# Patient Record
Sex: Male | Born: 1955
Health system: Southern US, Community
[De-identification: ages and names within clinical notes are randomized; demographics above are authoritative.]

## PROBLEM LIST (undated history)

## (undated) DIAGNOSIS — Z86010 Personal history of colonic polyps: Secondary | ICD-10-CM

## (undated) DIAGNOSIS — J449 Chronic obstructive pulmonary disease, unspecified: Secondary | ICD-10-CM

## (undated) DIAGNOSIS — I10 Essential (primary) hypertension: Secondary | ICD-10-CM

## (undated) DIAGNOSIS — R972 Elevated prostate specific antigen [PSA]: Secondary | ICD-10-CM

## (undated) DIAGNOSIS — C61 Malignant neoplasm of prostate: Secondary | ICD-10-CM

## (undated) DIAGNOSIS — E785 Hyperlipidemia, unspecified: Secondary | ICD-10-CM

## (undated) DIAGNOSIS — M1990 Unspecified osteoarthritis, unspecified site: Secondary | ICD-10-CM

## (undated) DIAGNOSIS — C801 Malignant (primary) neoplasm, unspecified: Secondary | ICD-10-CM

## (undated) DIAGNOSIS — R7309 Other abnormal glucose: Secondary | ICD-10-CM

## (undated) DIAGNOSIS — E559 Vitamin D deficiency, unspecified: Secondary | ICD-10-CM

## (undated) DIAGNOSIS — R7303 Prediabetes: Secondary | ICD-10-CM

## (undated) DIAGNOSIS — N320 Bladder-neck obstruction: Secondary | ICD-10-CM

## (undated) HISTORY — PX: COLONOSCOPY: SHX174

## (undated) HISTORY — DX: Vitamin D deficiency, unspecified: E55.9

## (undated) HISTORY — DX: Malignant (primary) neoplasm, unspecified: C80.1

## (undated) HISTORY — DX: Unspecified osteoarthritis, unspecified site: M19.90

## (undated) HISTORY — DX: Essential (primary) hypertension: I10

## (undated) HISTORY — DX: Personal history of colonic polyps: Z86.010

## (undated) HISTORY — DX: Other abnormal glucose: R73.09

## (undated) HISTORY — DX: Hyperlipidemia, unspecified: E78.5

---

## 2001-04-03 ENCOUNTER — Ambulatory Visit (HOSPITAL_COMMUNITY): Admission: RE | Admit: 2001-04-03 | Discharge: 2001-04-03 | Payer: Self-pay | Admitting: Internal Medicine

## 2001-04-03 ENCOUNTER — Encounter: Payer: Self-pay | Admitting: Internal Medicine

## 2007-10-18 ENCOUNTER — Encounter: Payer: Self-pay | Admitting: Internal Medicine

## 2007-12-12 ENCOUNTER — Ambulatory Visit: Payer: Self-pay | Admitting: Internal Medicine

## 2007-12-28 ENCOUNTER — Ambulatory Visit: Payer: Self-pay | Admitting: Internal Medicine

## 2007-12-28 ENCOUNTER — Encounter: Payer: Self-pay | Admitting: Internal Medicine

## 2007-12-28 DIAGNOSIS — Z860100 Personal history of colon polyps, unspecified: Secondary | ICD-10-CM

## 2007-12-28 HISTORY — DX: Personal history of colonic polyps: Z86.010

## 2007-12-28 HISTORY — DX: Personal history of colon polyps, unspecified: Z86.0100

## 2008-01-08 ENCOUNTER — Encounter: Payer: Self-pay | Admitting: Internal Medicine

## 2012-12-04 ENCOUNTER — Encounter: Payer: Self-pay | Admitting: Internal Medicine

## 2012-12-04 DIAGNOSIS — R7309 Other abnormal glucose: Secondary | ICD-10-CM | POA: Insufficient documentation

## 2012-12-04 DIAGNOSIS — I10 Essential (primary) hypertension: Secondary | ICD-10-CM | POA: Insufficient documentation

## 2012-12-04 DIAGNOSIS — E782 Mixed hyperlipidemia: Secondary | ICD-10-CM | POA: Insufficient documentation

## 2012-12-04 DIAGNOSIS — E559 Vitamin D deficiency, unspecified: Secondary | ICD-10-CM | POA: Insufficient documentation

## 2012-12-05 ENCOUNTER — Encounter: Payer: Self-pay | Admitting: Internal Medicine

## 2012-12-05 ENCOUNTER — Ambulatory Visit: Payer: BC Managed Care – PPO | Admitting: Internal Medicine

## 2012-12-05 VITALS — BP 132/76 | Temp 97.9°F | Resp 18 | Ht 66.5 in | Wt 217.8 lb

## 2012-12-05 DIAGNOSIS — E349 Endocrine disorder, unspecified: Secondary | ICD-10-CM | POA: Insufficient documentation

## 2012-12-05 DIAGNOSIS — Z1212 Encounter for screening for malignant neoplasm of rectum: Secondary | ICD-10-CM

## 2012-12-05 DIAGNOSIS — E559 Vitamin D deficiency, unspecified: Secondary | ICD-10-CM

## 2012-12-05 DIAGNOSIS — E291 Testicular hypofunction: Secondary | ICD-10-CM

## 2012-12-05 DIAGNOSIS — R7309 Other abnormal glucose: Secondary | ICD-10-CM

## 2012-12-05 DIAGNOSIS — Z113 Encounter for screening for infections with a predominantly sexual mode of transmission: Secondary | ICD-10-CM

## 2012-12-05 DIAGNOSIS — Z0000 Encounter for general adult medical examination without abnormal findings: Secondary | ICD-10-CM

## 2012-12-05 DIAGNOSIS — Z79899 Other long term (current) drug therapy: Secondary | ICD-10-CM

## 2012-12-05 DIAGNOSIS — I10 Essential (primary) hypertension: Secondary | ICD-10-CM

## 2012-12-05 DIAGNOSIS — Z125 Encounter for screening for malignant neoplasm of prostate: Secondary | ICD-10-CM

## 2012-12-05 NOTE — Patient Instructions (Signed)

## 2012-12-05 NOTE — Progress Notes (Signed)
Patient ID: Robert [name], male   DOB: September 08, 1955, 57 y.o.   MRN: [id number]  Annual Screening Comprehensive Examination  This very nice 57 yo MWM presents for complete physical.  Patient has been followed for HTN, Prediabetes, Hyperlipidemia, and vitamin D Deficiency.   Patient's BP has been controlled at home. Patient denies any cardiac symptoms as chest pain, palpitations, shortness of breath, dizziness or ankle swelling.   Patient's hyperlipidemia is controlled with diet and medications. Patient denies myalgias or other medication SE's. Last cholesterol last visit was 172, triglycerides 100, HDL 59 and LDL 93 all at goal.     Patient has prediabetes/insulin resistance with 5.7% in 2009, 5.8% in 2012, and more recently 5.6% in June this year.  Patient denies reactive hypoglycemic symptoms, visual blurring, diabetic polys, or paresthesias.    Patient has Testosterone Deficiency and has deferred treatment in the past.   Finally, patient has history of Vitamin D Deficiency with last vitamin D 77 in June (was 21 in 2008).   Medication Sig Dispense Refill  . aspirin 81 MG tablet Take 81 mg by mouth daily.      Marland Kitchen atenolol (TENORMIN) 100 MG tablet Take 100 mg by mouth daily.      . benazepril-hydrochlorthiazide (LOTENSIN HCT) 20-25 MG per tablet Take 1 tablet by mouth daily.      . Cholecalciferol (VITAMIN D) 2000 UNITS tablet Take 2,000 Units by mouth 3 (three) times daily.      . Magnesium 250 MG TABS Take by mouth daily.      . pravastatin (PRAVACHOL) 40 MG tablet Take 40 mg by mouth daily.       No current facility-administered medications on file prior to visit.    Allergies  Allergen Reactions  . Codeine     REACTION: Itching    Past Medical History  Diagnosis Date  . Hypertension   . Hyperlipidemia   . Vitamin D deficiency   . Elevated hemoglobin A1c     No past surgical history on file.  Family History  Problem Relation Age of Onset  . Cancer Mother     lung     History   Social History  . Marital Status: Married    Spouse Name: N/A    Number of Children: N/A  . Years of Education: N/A   Occupational History  . 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 Smoking status: Current Every Day Smoker  . Smokeless tobacco: No  . Alcohol Use: Occas beer  . Drug Use: No  . Sexual Activity: Active      ROS Constitutional: Denies fever, chills, weight loss/gain, headaches, insomnia, fatigue, night sweats, and change in appetite. Eyes: Denies redness, blurred vision, diplopia, discharge, itchy, watery eyes.  ENT: Denies discharge, congestion, post nasal drip, epistaxis, sore throat, earache, hearing loss, dental pain, Tinnitus, Vertigo, Sinus pain, snoring.  Cardio: Denies chest pain, palpitations, irregular heartbeat, syncope, dyspnea, diaphoresis, orthopnea, PND, claudication, edema Respiratory: denies cough, dyspnea, DOE, pleurisy, hoarseness, laryngitis, wheezing.  Gastrointestinal: Denies dysphagia, heartburn, reflux, water brash, pain, cramps, nausea, vomiting, bloating, diarrhea, constipation, hematemesis, melena, hematochezia, jaundice, hemorrhoids Genitourinary: Denies dysuria, frequency, urgency, nocturia, hesitancy, discharge, hematuria, flank pain Musculoskeletal: Denies arthralgia, myalgia, stiffness, Jt. Swelling, pain, limp, and strain/sprain. Skin: Denies puritis, rash, hives, warts, acne, eczema, changing in skin lesion Neuro: Weakness, tremor, incoordination, spasms, paresthesia, pain Psychiatric: Denies confusion, memory loss, sensory loss Endocrine: Denies change in weight, skin, hair change, nocturia,  and paresthesia, diabetic polys, visual blurring, hyper /hypo glycemic episodes.  Heme/Lymph: No excessive bleeding, bruising, enlarged lymph nodes.  Filed Vitals:   12/05/12 1519  BP: 132/76  Temp: 97.9 F (36.6 C)  Resp: 18    Estimated body mass index is 34.63 kg/(m^2) as calculated from the  following:   Height as of this encounter: 5' 6.5" (1.689 m).   Weight as of this encounter: 217 lb 12.8 oz (98.793 kg).  Physical Exam General Appearance: Well nourished, in no apparent distress. Eyes: PERRLA, EOMs, conjunctiva no swelling or erythema, normal fundi and vessels. Sinuses: No frontal/maxillary tenderness ENT/Mouth: EACs patent / TMs  nl. Nares clear without erythema, swelling, mucoid exudates. Oral hygiene is good. No erythema, swelling, or exudate. Tongue normal, non-obstructing. Tonsils not swollen or erythematous. Hearing normal.  Neck: Supple, thyroid normal. No bruits, nodes or JVD. Respiratory: Respiratory effort normal.  BS equal and clear bilateral without rales, rhonci, wheezing or stridor. Cardio: Heart sounds are normal with regular rate and rhythm and no murmurs, rubs or gallops. Peripheral pulses are normal and equal bilaterally without edema. No aortic or femoral bruits. Chest: symmetric with normal excursions and percussion.  Abdomen: Flat, soft,obese with nl. bowel sounds. Nontender, no guarding, rebound, hernias, masses, or organomegaly.  Lymphatics: Non tender without lymphadenopathy.  Genitourinary: No hernias.Testes nl. DRE - prostate nl for age - smooth & firm w/o nodules. Musculoskeletal: Full ROM all peripheral extremities, joint stability, 5/5 strength, and normal gait. Skin: Warm and dry without rashes, lesions, cyanosis, clubbing or  ecchymosis.  Neuro: Cranial nerves intact, reflexes equal bilaterally. Normal muscle tone, no cerebellar symptoms. Sensation intact.  Pysch: Awake and oriented X 3, normal affect, Insight and Judgment appropriate.   Assessment and Plan  1. Annual Screening Examination 2. Hypertension  3. Hyperlipidemia 4. Pre Diabetes 5. Vitamin D Deficiency 6. Morbid Obesity 7. Testosterone Deficiency  Continue prudent diet as discussed, weight control, regular exercise, and medications. Routine screening labs and tests as requested  with regular follow-up as recommended.

## 2012-12-06 LAB — BASIC METABOLIC PANEL WITH GFR
BUN: 16 mg/dL (ref 6–23)
Calcium: 9.1 mg/dL (ref 8.4–10.5)
Chloride: 100 mEq/L (ref 96–112)
Creat: 0.78 mg/dL (ref 0.50–1.35)
GFR, Est African American: >89 mL/min
GFR, Est Non African American: >89 mL/min
Sodium: 136 mEq/L (ref 135–145)

## 2012-12-06 LAB — CBC WITH DIFFERENTIAL/PLATELET
Basophils Absolute: 0 10*3/uL (ref 0.0–0.1)
Basophils Relative: 0 % (ref 0–1)
Eosinophils Absolute: 0.2 10*3/uL (ref 0.0–0.7)
Eosinophils Relative: 2 % (ref 0–5)
Lymphocytes Relative: 31 % (ref 12–46)
Lymphs Abs: 2.9 10*3/uL (ref 0.7–4.0)
MCHC: 34.7 g/dL (ref 30.0–36.0)
MCV: 95.4 fL (ref 78.0–100.0)
Monocytes Absolute: 1 10*3/uL (ref 0.1–1.0)
Neutrophils Relative %: 57 % (ref 43–77)
Platelets: 205 10*3/uL (ref 150–400)
RBC: 4.75 MIL/uL (ref 4.22–5.81)
RDW: 13 % (ref 11.5–15.5)
WBC: 9.4 10*3/uL (ref 4.0–10.5)

## 2012-12-06 LAB — URINALYSIS, MICROSCOPIC ONLY
Casts: NONE SEEN
Crystals: NONE SEEN
Squamous Epithelial / LPF: NONE SEEN

## 2012-12-06 LAB — LIPID PANEL
Cholesterol: 159 mg/dL (ref 0–200)
HDL: 58 mg/dL (ref >39)
LDL Cholesterol: 80 mg/dL (ref 0–99)
Total CHOL/HDL Ratio: 2.7 Ratio

## 2012-12-06 LAB — RPR

## 2012-12-06 LAB — HEPATITIS B CORE ANTIBODY, TOTAL: Hep B Core Total Ab: NONREACTIVE

## 2012-12-06 LAB — HEPATITIS B SURFACE ANTIBODY,QUALITATIVE: Hep B S Ab: NEGATIVE

## 2012-12-06 LAB — MICROALBUMIN / CREATININE URINE RATIO: Microalb, Ur: 0.78 mg/dL (ref 0.00–1.89)

## 2012-12-06 LAB — INSULIN, FASTING: Insulin fasting, serum: 53 u[IU]/mL — ABNORMAL HIGH (ref 3–28)

## 2012-12-06 LAB — VITAMIN D 25 HYDROXY (VIT D DEFICIENCY, FRACTURES): Vit D, 25-Hydroxy: 83 ng/mL (ref 30–89)

## 2012-12-06 LAB — HEPATITIS A ANTIBODY, TOTAL: Hep A Total Ab: NONREACTIVE

## 2012-12-06 LAB — HEPATIC FUNCTION PANEL
Albumin: 4 g/dL (ref 3.5–5.2)
Bilirubin, Direct: 0.1 mg/dL (ref 0.0–0.3)
Total Bilirubin: 0.3 mg/dL (ref 0.3–1.2)

## 2012-12-06 LAB — HIV ANTIBODY (ROUTINE TESTING W REFLEX): HIV: NONREACTIVE

## 2012-12-06 LAB — TESTOSTERONE: Testosterone: 361 ng/dL (ref 300–890)

## 2012-12-09 ENCOUNTER — Encounter: Payer: Self-pay | Admitting: Internal Medicine

## 2012-12-10 ENCOUNTER — Other Ambulatory Visit: Payer: Self-pay | Admitting: Internal Medicine

## 2012-12-10 DIAGNOSIS — R799 Abnormal finding of blood chemistry, unspecified: Secondary | ICD-10-CM

## 2012-12-10 DIAGNOSIS — R972 Elevated prostate specific antigen [PSA]: Secondary | ICD-10-CM

## 2012-12-11 ENCOUNTER — Encounter: Payer: Self-pay | Admitting: Internal Medicine

## 2012-12-11 LAB — HEPATITIS B E ANTIBODY: Hepatitis Be Antibody: NEGATIVE

## 2012-12-15 ENCOUNTER — Other Ambulatory Visit: Payer: Self-pay | Admitting: Internal Medicine

## 2012-12-15 DIAGNOSIS — I10 Essential (primary) hypertension: Secondary | ICD-10-CM

## 2012-12-15 NOTE — Addendum Note (Signed)
Addended by: Lucky Cowboy on: 12/15/2012 02:26 PM   Modules accepted: Orders

## 2013-02-05 ENCOUNTER — Ambulatory Visit (INDEPENDENT_AMBULATORY_CARE_PROVIDER_SITE_OTHER): Payer: BC Managed Care – PPO | Admitting: Internal Medicine

## 2013-02-05 ENCOUNTER — Encounter: Payer: Self-pay | Admitting: Internal Medicine

## 2013-02-05 VITALS — BP 124/80 | HR 64 | Temp 97.9°F | Resp 18 | Wt 221.2 lb

## 2013-02-05 DIAGNOSIS — J0410 Acute tracheitis without obstruction: Secondary | ICD-10-CM

## 2013-02-05 MED ORDER — AZITHROMYCIN 250 MG PO TABS: ORAL_TABLET | ORAL | Status: AC

## 2013-02-05 MED ORDER — HYDROCODONE-ACETAMINOPHEN 5-325 MG PO TABS: ORAL_TABLET | ORAL | Status: DC

## 2013-02-05 MED ORDER — PREDNISONE 20 MG PO TABS
20.0000 mg | ORAL_TABLET | ORAL | Status: DC
Start: 2013-02-05 — End: 2013-04-11

## 2013-02-05 NOTE — Progress Notes (Signed)
   Subjective:    Patient ID: Robert [name], male    DOB: 1955-03-25, 58 y.o.   MRN: [id number]  Cough This is a new problem. The current episode started in the past 7 days. The problem has been gradually improving. The problem occurs constantly. The cough is productive of sputum. Associated symptoms include chest pain, chills, ear congestion, a fever, headaches, myalgias, nasal congestion, a sore throat and sweats. Pertinent negatives include no ear pain, eye redness, heartburn, hemoptysis, postnasal drip, rash, rhinorrhea, shortness of breath, weight loss or wheezing. Nothing aggravates the symptoms. The treatment provided mild relief. His past medical history is significant for bronchitis and COPD. There is no history of asthma, bronchiectasis or emphysema.      Review of Systems  Constitutional: Positive for fever and chills. Negative for weight loss, diaphoresis and fatigue.  HENT: Positive for congestion, sinus pressure and sore throat. Negative for ear discharge, ear pain, facial swelling, hearing loss, mouth sores, postnasal drip, rhinorrhea, tinnitus, trouble swallowing and voice change.   Eyes: Negative for photophobia, pain and redness.  Respiratory: Positive for cough. Negative for hemoptysis, choking, chest tightness, shortness of breath, wheezing and stridor.   Cardiovascular: Positive for chest pain. Negative for palpitations and leg swelling.  Gastrointestinal: Negative.  Negative for heartburn.  Endocrine: Negative.   Genitourinary: Negative.   Musculoskeletal: Positive for myalgias.  Skin: Negative for rash.  Neurological: Positive for headaches.  Hematological: Negative.   Psychiatric/Behavioral: Negative.        Objective:   Physical Exam  Constitutional: He is oriented to person, place, and time. He appears well-nourished.  HENT:  Head: Normocephalic.  Eyes: EOM are normal. Pupils are equal, round, and reactive to light. Right eye exhibits discharge. Left eye  exhibits no discharge. No scleral icterus.  Neck: No JVD present. No thyromegaly present.  Cardiovascular: Normal rate, regular rhythm, normal heart sounds and intact distal pulses.  Exam reveals no gallop and no friction rub.   No murmur heard. Pulmonary/Chest: Effort normal. No respiratory distress. He has wheezes. He has rales.  Abdominal: Soft. Bowel sounds are normal. He exhibits no distension. There is no tenderness.  Musculoskeletal: He exhibits no edema.  Lymphadenopathy:    He has no cervical adenopathy.  Neurological: He is alert and oriented to person, place, and time. He has normal reflexes. No cranial nerve deficit. Coordination normal.          Assessment & Plan:   1. Acute tracheitis without mention of obstruction  - azithromycin (ZITHROMAX) 250 MG tablet; Take 2 tablets (500 mg) on  Day 1,  followed by 1 tablet (250 mg) once daily on Days 2 through 5.  Dispense: 6 each; Refill: 1 - predniSONE (DELTASONE) 20 MG tablet; Take 1 tablet (20 mg total) by mouth See admin instructions. 1 tab 3 x day for 3 days, then 1 tab 2 x day for 3 days, then 1 tab 1 x day for 5 days  Dispense: 20 tablet; Refill: 0 - HYDROcodone-acetaminophen (NORCO) 5-325 MG per tablet; 1/2 to 1 tablet every 3 to 4 hours as needed for cough or pain  Dispense: 50 tablet; Refill: 0

## 2013-02-05 NOTE — Patient Instructions (Signed)
Acute Bronchitis Bronchitis is inflammation of the airways that extend from the windpipe into the lungs (bronchi). The inflammation often causes mucus to develop. This leads to a cough, which is the most common symptom of bronchitis.  In acute bronchitis, the condition usually develops suddenly and goes away over time, usually in a couple weeks. Smoking, allergies, and asthma can make bronchitis worse. Repeated episodes of bronchitis may cause further lung problems.  CAUSES Acute bronchitis is most often caused by the same virus that causes a cold. The virus can spread from person to person (contagious).  SIGNS AND SYMPTOMS   Cough.   Fever.   Coughing up mucus.   Body aches.   Chest congestion.   Chills.   Shortness of breath.   Sore throat.  DIAGNOSIS  Acute bronchitis is usually diagnosed through a physical exam. Tests, such as chest X-rays, are sometimes done to rule out other conditions.  TREATMENT  Acute bronchitis usually goes away in a couple weeks. Often times, no medical treatment is necessary. Medicines are sometimes given for relief of fever or cough. Antibiotics are usually not needed but may be prescribed in certain situations. In some cases, an inhaler may be recommended to help reduce shortness of breath and control the cough. A cool mist vaporizer may also be used to help thin bronchial secretions and make it easier to clear the chest.  HOME CARE INSTRUCTIONS  Get plenty of rest.   Drink enough fluids to keep your urine clear or pale yellow (unless you have a medical condition that requires fluid restriction). Increasing fluids may help thin your secretions and will prevent dehydration.   Only take over-the-counter or prescription medicines as directed by your health care provider.   Avoid smoking and secondhand smoke. Exposure to cigarette smoke or irritating chemicals will make bronchitis worse. If you are a smoker, consider using nicotine gum or skin  patches to help control withdrawal symptoms. Quitting smoking will help your lungs heal faster.   Reduce the chances of another bout of acute bronchitis by washing your hands frequently, avoiding people with cold symptoms, and trying not to touch your hands to your mouth, nose, or eyes.   Follow up with your health care provider as directed.  SEEK MEDICAL CARE IF: Your symptoms do not improve after 1 week of treatment.  SEEK IMMEDIATE MEDICAL CARE IF:  You develop an increased fever or chills.   You have chest pain.   You have severe shortness of breath.  You have bloody sputum.   You develop dehydration.  You develop fainting.  You develop repeated vomiting.  You develop a severe headache. MAKE SURE YOU:   Understand these instructions.  Will watch your condition.  Will get help right away if you are not doing well or get worse. Document Released: 02/05/2004 Document Revised: 08/30/2012 Document Reviewed: 06/20/2012 [employer] Patient Information 2014 [employer].  

## 2013-02-12 ENCOUNTER — Other Ambulatory Visit: Payer: Self-pay | Admitting: Internal Medicine

## 2013-02-22 NOTE — Progress Notes (Signed)
Patient was seen [facility].

## 2013-03-09 ENCOUNTER — Ambulatory Visit: Payer: Self-pay | Admitting: Physician Assistant

## 2013-03-19 ENCOUNTER — Encounter: Payer: Self-pay | Admitting: Physician Assistant

## 2013-03-19 ENCOUNTER — Ambulatory Visit (INDEPENDENT_AMBULATORY_CARE_PROVIDER_SITE_OTHER): Payer: BC Managed Care – PPO | Admitting: Physician Assistant

## 2013-03-19 VITALS — BP 138/78 | HR 64 | Temp 97.9°F | Resp 16 | Ht 66.0 in | Wt 227.0 lb

## 2013-03-19 DIAGNOSIS — I10 Essential (primary) hypertension: Secondary | ICD-10-CM

## 2013-03-19 DIAGNOSIS — E559 Vitamin D deficiency, unspecified: Secondary | ICD-10-CM

## 2013-03-19 DIAGNOSIS — Z79899 Other long term (current) drug therapy: Secondary | ICD-10-CM

## 2013-03-19 DIAGNOSIS — E291 Testicular hypofunction: Secondary | ICD-10-CM

## 2013-03-19 DIAGNOSIS — R7309 Other abnormal glucose: Secondary | ICD-10-CM

## 2013-03-19 DIAGNOSIS — E669 Obesity, unspecified: Secondary | ICD-10-CM | POA: Insufficient documentation

## 2013-03-19 DIAGNOSIS — E785 Hyperlipidemia, unspecified: Secondary | ICD-10-CM

## 2013-03-19 LAB — CBC WITH DIFFERENTIAL/PLATELET
BASOS PCT: 0 % (ref 0–1)
Basophils Absolute: 0 10*3/uL (ref 0.0–0.1)
Eosinophils Absolute: 0.1 10*3/uL (ref 0.0–0.7)
Eosinophils Relative: 1 % (ref 0–5)
HEMATOCRIT: 44.2 % (ref 39.0–52.0)
Hemoglobin: 15.4 g/dL (ref 13.0–17.0)
Lymphocytes Relative: 27 % (ref 12–46)
Lymphs Abs: 2.7 10*3/uL (ref 0.7–4.0)
MCH: 33.3 pg (ref 26.0–34.0)
MCHC: 34.8 g/dL (ref 30.0–36.0)
MCV: 95.5 fL (ref 78.0–100.0)
MONO ABS: 1.2 10*3/uL — AB (ref 0.1–1.0)
Monocytes Relative: 12 % (ref 3–12)
NEUTROS PCT: 60 % (ref 43–77)
Neutro Abs: 6 10*3/uL (ref 1.7–7.7)
PLATELETS: 208 10*3/uL (ref 150–400)
RBC: 4.63 MIL/uL (ref 4.22–5.81)
RDW: 13 % (ref 11.5–15.5)
WBC: 10 10*3/uL (ref 4.0–10.5)

## 2013-03-19 LAB — HEMOGLOBIN A1C
Hgb A1c MFr Bld: 5.5 % (ref <5.7)
Mean Plasma Glucose: 111 mg/dL (ref <117)

## 2013-03-19 NOTE — Progress Notes (Signed)
HPI 58 y.o. male  presents for 3 month follow up with hypertension, hyperlipidemia, prediabetes and vitamin D. His blood pressure has been controlled at home, today their BP is BP: 138/78 mmHg He does not work out but at work he has to walk up and down stairs which is like a workout. He denies chest pain, shortness of breath, dizziness.  He is on cholesterol medication and denies myalgias. His cholesterol is at goal. The cholesterol last visit was:   Lab Results  Component Value Date   CHOL 159 12/05/2012   HDL 58 12/05/2012   LDLCALC 80 12/05/2012   TRIG 104 12/05/2012   CHOLHDL 2.7 12/05/2012   He has been working on diet and exercise for prediabetes, and denies foot ulcerations, hyperglycemia, hypoglycemia , paresthesia of the feet, polydipsia, polyuria and visual disturbances. Last A1C in the office was:  Lab Results  Component Value Date   HGBA1C 5.7* 12/05/2012   Patient is on Vitamin D supplement.     Current Medications:  Current Outpatient Prescriptions on File Prior to Visit  Medication Sig Dispense Refill  . aspirin 81 MG tablet Take 81 mg by mouth daily.      Marland Kitchen atenolol (TENORMIN) 100 MG tablet Take 100 mg by mouth daily.      . benazepril-hydrochlorthiazide (LOTENSIN HCT) 20-25 MG per tablet Take 1 tablet by mouth daily.      Marland Kitchen buPROPion (WELLBUTRIN XL) 300 MG 24 hr tablet TAKE ONE TABLET BY MOUTH DAILY  30 tablet  1  . Cholecalciferol (VITAMIN D) 2000 UNITS tablet Take 2,000 Units by mouth 3 (three) times daily.      Marland Kitchen HYDROcodone-acetaminophen (NORCO) 5-325 MG per tablet 1/2 to 1 tablet every 3 to 4 hours as needed for cough or pain  50 tablet  0  . Magnesium 250 MG TABS Take by mouth daily.      . pravastatin (PRAVACHOL) 40 MG tablet Take 40 mg by mouth daily.      . predniSONE (DELTASONE) 20 MG tablet Take 1 tablet (20 mg total) by mouth See admin instructions. 1 tab 3 x day for 3 days, then 1 tab 2 x day for 3 days, then 1 tab 1 x day for 5 days  20 tablet  0   No  current facility-administered medications on file prior to visit.   Medical History:  Past Medical History  Diagnosis Date  . Hypertension   . Hyperlipidemia   . Vitamin D deficiency   . Elevated hemoglobin A1c   . Personal history of colonic adenomas 12/28/2007   Allergies:  Allergies  Allergen Reactions  . Codeine     REACTION: Itching     Review of Systems: [X]  = complains of  [ ]  = denies  General: Fatigue [ ]  Fever [ ]  Chills [ ]  Weakness [ ]   Insomnia [ ]  Eyes: Redness [ ]  Blurred vision [ ]  Diplopia [ ]   ENT: Congestion [ ]  Sinus Pain [ ]  Post Nasal Drip [ ]  Sore Throat [ ]  Earache [ ]   Cardiac: Chest pain/pressure [ ]  SOB [ ]  Orthopnea [ ]   Palpitations [ ]   Paroxysmal nocturnal dyspnea[ ]  Claudication [ ]  Edema [ ]   Pulmonary: Cough [ ]  Wheezing[ ]   SOB [ ]   Snoring [ ]   GI: Nausea [ ]  Vomiting[ ]  Dysphagia[ ]  Heartburn[ ]  Abdominal pain [ ]  Constipation [ ] ; Diarrhea [ ] ; BRBPR [ ]  Melena[ ]  GU: Hematuria[ ]  Dysuria [ ]  Nocturia[ ]   Urgency [ ]   Hesitancy [ ]  Discharge [ ]  Neuro: Headaches[ ]  Vertigo[ ]  Paresthesias[ ]  Spasm [ ]  Speech changes [ ]  Incoordination [ ]   Ortho: Arthritis [ ]  Joint pain [ ]  Muscle pain [ ]  Joint swelling [ ]  Back Pain [ ]  Skin:  Rash [ ]   Pruritis [ ]  Change in skin lesion [ ]   Psych: Depression[ ]  Anxiety[ ]  Confusion [ ]  Memory loss [ ]   Heme/Lypmh: Bleeding [ ]  Bruising [ ]  Enlarged lymph nodes [ ]   Endocrine: Visual blurring [ ]  Paresthesia [ ]  Polyuria [ ]  Polydypsea [ ]    Heat/cold intolerance [ ]  Hypoglycemia [ ]   Family history- Review and unchanged Social history- Review and unchanged Physical Exam: Filed Vitals:   03/19/13 1604  BP: 138/78  Pulse: 64  Temp: 97.9 F (36.6 C)  Resp: 16   Wt Readings from Last 3 Encounters:  03/19/13 227 lb (102.967 kg)  02/05/13 221 lb 3.2 oz (100.336 kg)  12/05/12 217 lb 12.8 oz (98.793 kg)   Body mass index is 36.66 kg/(m^2).  General Appearance: Well nourished, in no apparent  distress. Eyes: PERRLA, EOMs, conjunctiva no swelling or erythema Sinuses: No Frontal/maxillary tenderness ENT/Mouth: Ext aud canals clear, TMs without erythema, bulging. No erythema, swelling, or exudate on post pharynx.  Tonsils not swollen or erythematous. Hearing normal.  Neck: Supple, thyroid normal.  Respiratory: Respiratory effort normal, BS equal bilaterally without rales, rhonchi, wheezing or stridor.  Cardio: RRR with no MRGs. Brisk peripheral pulses without edema.  Abdomen: Soft, + BS.  Non tender, no guarding, rebound, hernias, masses. Lymphatics: Non tender without lymphadenopathy.  Musculoskeletal: Full ROM, 5/5 strength, normal gait.  Skin: Warm, dry without rashes, lesions, ecchymosis.  Neuro: Cranial nerves intact. Normal muscle tone, no cerebellar symptoms. Sensation intact.  Psych: Awake and oriented X 3, normal affect, Insight and Judgment appropriate.   Assessment and Plan:  Hypertension: Continue medication, monitor blood pressure at home.  Continue DASH diet. Cholesterol: Continue diet and exercise. Check cholesterol.  Pre-diabetes-Continue diet and exercise. Check A1C Obesity- discussed weight loss, diet Vitamin D Def- check level and continue medications.  Smoking cessation discussed- cutting back  Continue diet and meds as discussed. Further disposition pending results of labs.  Vicie Mutters 4:31 PM

## 2013-03-19 NOTE — Patient Instructions (Signed)
Bad carbs also include fruit juice, alcohol, and sweet tea. These are empty calories that do not signal to your brain that you are full.   Please remember the good carbs are still carbs which convert into sugar. So please measure them out no more than 1/2-1 cup of rice, oatmeal, pasta, and beans.  Veggies are however free foods! Pile them on.   I like lean protein at every meal such as chicken, [place], pork chops, cottage cheese, etc. Just do not fry these meats and please center your meal around vegetable, the meats should be a side dish.   No all fruit is created equal. Please see the list below, the fruit at the bottom is higher in sugars than the fruit at the top   We want weight loss that will last so you should lose 1-2 pounds a week.  THAT IS IT! Please pick THREE things a month to change. Once it is a habit check off the item. Then pick another three items off the list to become habits.  If you are already doing a habit on the list GREAT!  Cross that item off! o Don't drink your calories. Ie, alcohol, soda, fruit juice, and sweet tea.  o Drink more water. Drink a glass when you feel hungry or before each meal.  o Eat breakfast - Complex carb and protein (likeDannon light and fit yogurt, oatmeal, fruit, eggs, [place] bacon). o Measure your cereal.  Eat no more than one cup a day. (ie [place]) o Eat an apple a day. o Add a vegetable a day. o Try a new vegetable a month. o Use Pam! Stop using oil or butter to cook. o Don't finish your plate or use smaller plates. o Share your dessert. o Eat sugar free Jello for dessert or frozen grapes. o Don't eat 2-3 hours before bed. o Switch to whole wheat bread, pasta, and brown rice. o Make healthier choices when you eat out. No fries! o Pick baked chicken, NOT fried. o Don't forget to SLOW DOWN when you eat. It is not going anywhere.  o Take the stairs. o Park far away in the parking lot o [employer] (or weights) for 10 minutes while  watching TV. o Walk at work for 10 minutes during break. o Walk outside 1 time a week with your friend, kids, dog, or significant other. o Start a walking group at [place] the mall as much as you can tolerate.  o Keep a food diary. o Weigh yourself daily. o Walk for 15 minutes 3 days per week. o Cook at home more often and eat out less.  If life happens and you go back to old habits, it is okay.  Just start over. You can do it!   If you experience chest pain, get short of breath, or tired during the exercise, please stop immediately and inform your doctor.  Obesity Obesity is defined as having too much total body fat and a body mass index (BMI) of 30 or more. BMI is an estimate of body fat and is calculated from your height and weight. Obesity happens when you consume more calories than you can burn by exercising or performing daily physical tasks. Prolonged obesity can cause major illnesses or emergencies, such as:   A stroke.  Heart disease.  Diabetes.  Cancer.  Arthritis.  High blood pressure (hypertension).  High cholesterol.  Sleep apnea.  Erectile dysfunction.  Infertility problems. CAUSES   Regularly eating  unhealthy foods.  Physical inactivity.  Certain disorders, such as an underactive thyroid (hypothyroidism), Cushing's syndrome, and polycystic ovarian syndrome.  Certain medicines, such as steroids, some depression medicines, and antipsychotics.  Genetics.  Lack of sleep. DIAGNOSIS  A caregiver can diagnose obesity after calculating your BMI. Obesity will be diagnosed if your BMI is 30 or higher.  There are other methods of measuring obesity levels. Some other methods include measuring your skin fold thickness, your waist circumference, and comparing your hip circumference to your waist circumference. TREATMENT  A healthy treatment program includes some or all of the following:  Long-term dietary changes.  Exercise and physical  activity.  Behavioral and lifestyle changes.  Medicine only under the supervision of your caregiver. Medicines may help, but only if they are used with diet and exercise programs. An unhealthy treatment program includes:  Fasting.  Fad diets.  Supplements and drugs. These choices do not succeed in long-term weight control.  HOME CARE INSTRUCTIONS   Exercise and perform physical activity as directed by your caregiver. To increase physical activity, try the following:  Use stairs instead of elevators.  Park farther away from store entrances.  Garden, bike, or walk instead of watching television or using the computer.  Eat healthy, low-calorie foods and drinks on a regular basis. Eat more fruits and vegetables. Use low-calorie cookbooks or take healthy cooking classes.  Limit fast food, sweets, and processed snack foods.  Eat smaller portions.  Keep a daily journal of everything you eat. There are many free websites to help you with this. It may be helpful to measure your foods so you can determine if you are eating the correct portion sizes.  Avoid drinking alcohol. Drink more water and drinks without calories.  Take vitamins and supplements only as recommended by your caregiver.  Weight-loss support groups, Nurse, mental health, counselors, and stress reduction education can also be very helpful. SEEK IMMEDIATE MEDICAL CARE IF:  You have chest pain or tightness.  You have trouble breathing or feel short of breath.  You have weakness or leg numbness.  You feel confused or have trouble talking.  You have sudden changes in your vision. MAKE SURE YOU:  Understand these instructions.  Will watch your condition.  Will get help right away if you are not doing well or get worse. Document Released: 02/05/2004 Document Revised: 06/29/2011 Document Reviewed: 02/03/2011 [facility] Patient Information 2014 [place].

## 2013-03-20 LAB — BASIC METABOLIC PANEL WITH GFR
BUN: 14 mg/dL (ref 6–23)
CO2: 29 mEq/L (ref 19–32)
CREATININE: 0.66 mg/dL (ref 0.50–1.35)
Calcium: 8.6 mg/dL (ref 8.4–10.5)
Chloride: 103 mEq/L (ref 96–112)
GFR, Est African American: >89 mL/min
Glucose, Bld: 82 mg/dL (ref 70–99)
Potassium: 4.3 mEq/L (ref 3.5–5.3)
Sodium: 139 mEq/L (ref 135–145)

## 2013-03-20 LAB — VITAMIN D 25 HYDROXY (VIT D DEFICIENCY, FRACTURES): Vit D, 25-Hydroxy: 91 ng/mL — ABNORMAL HIGH (ref 30–89)

## 2013-03-20 LAB — HEPATIC FUNCTION PANEL
ALT: 17 U/L (ref 0–53)
AST: 17 U/L (ref 0–37)
Albumin: 3.9 g/dL (ref 3.5–5.2)
Alkaline Phosphatase: 46 U/L (ref 39–117)
BILIRUBIN INDIRECT: 0.2 mg/dL (ref 0.2–1.2)
BILIRUBIN TOTAL: 0.3 mg/dL (ref 0.2–1.2)
Bilirubin, Direct: 0.1 mg/dL (ref 0.0–0.3)
TOTAL PROTEIN: 6.1 g/dL (ref 6.0–8.3)

## 2013-03-20 LAB — LIPID PANEL
CHOL/HDL RATIO: 2.8 ratio
Cholesterol: 151 mg/dL (ref 0–200)
HDL: 53 mg/dL (ref >39)
LDL Cholesterol: 81 mg/dL (ref 0–99)
Triglycerides: 87 mg/dL (ref <150)
VLDL: 17 mg/dL (ref 0–40)

## 2013-03-20 LAB — INSULIN, FASTING: Insulin fasting, serum: 16 u[IU]/mL (ref 3–28)

## 2013-03-20 LAB — MAGNESIUM: MAGNESIUM: 1.9 mg/dL (ref 1.5–2.5)

## 2013-03-20 LAB — TSH: TSH: 1.05 u[IU]/mL (ref 0.350–4.500)

## 2013-04-11 ENCOUNTER — Ambulatory Visit (INDEPENDENT_AMBULATORY_CARE_PROVIDER_SITE_OTHER): Payer: BC Managed Care – PPO | Admitting: Internal Medicine

## 2013-04-11 ENCOUNTER — Encounter: Payer: Self-pay | Admitting: Internal Medicine

## 2013-04-11 VITALS — BP 124/80 | HR 64 | Temp 98.2°F | Resp 18 | Ht 66.5 in | Wt 224.4 lb

## 2013-04-11 DIAGNOSIS — L02429 Furuncle of limb, unspecified: Secondary | ICD-10-CM

## 2013-04-11 DIAGNOSIS — N498 Inflammatory disorders of other specified male genital organs: Secondary | ICD-10-CM

## 2013-04-11 DIAGNOSIS — L02439 Carbuncle of limb, unspecified: Secondary | ICD-10-CM

## 2013-04-11 DIAGNOSIS — N492 Inflammatory disorders of scrotum: Secondary | ICD-10-CM

## 2013-04-11 MED ORDER — DOXYCYCLINE HYCLATE 100 MG PO CAPS: 100.0000 mg | ORAL_CAPSULE | Freq: Two times a day (BID) | ORAL | Status: DC

## 2013-04-11 NOTE — Progress Notes (Signed)
   Subjective:    Patient ID: [name], male    DOB: [birth year], 58 y.o.   MRN: [id number]  HPI very nice 58 yo MWM with  Hx/o HTN, HLD, severe acne and recurrent boils - presents with two (2) boils - 1st in the Rt axilla and 2sd in the left scrotal area  Meds, allergies, PMH FH, and social Hx reviewed.   Review of Systems non- contributory to above  Objective:   Physical Exam    BP 124/80  Pulse 64  Temp98.2 F   Resp 18  Ht 5' 6.5"   Wt 224 lb 6.4 oz   BMI 35.68 kg/m2 Focused exam in Rt axilla finds a tender midaxillary abscess ~ 1.5 x 1.5 cm.--2sd also finds a  ~ 2.5 x 2.5 cm draining abscess in th left groin inguinoscrotal fold. No signs of lymphangitis.  Assessment  11. Abscess, Lt scrotum   1. Abscess, Lt scrotum  2. Abscess, Rt axillae   Plan:  -both abscesses were cultured separately  - Rx Dpoxycycline 100 mg #60 - 1 cap bid px   - Recc Hot tub soaks 2 x day   - ROV 2 weeks for re-evaluation

## 2013-04-11 NOTE — Patient Instructions (Signed)
Abscess An abscess is an infected area that contains a collection of pus and debris.It can occur in almost any part of the body. An abscess is also known as a furuncle or boil. CAUSES  An abscess occurs when tissue gets infected. This can occur from blockage of oil or sweat glands, infection of hair follicles, or a minor injury to the skin. As the body tries to fight the infection, pus collects in the area and creates pressure under the skin. This pressure causes pain. People with weakened immune systems have difficulty fighting infections and get certain abscesses more often.  SYMPTOMS Usually an abscess develops on the skin and becomes a painful mass that is red, warm, and tender. If the abscess forms under the skin, you may feel a moveable soft area under the skin. Some abscesses break open (rupture) on their own, but most will continue to get worse without care. The infection can spread deeper into the body and eventually into the bloodstream, causing you to feel ill.  DIAGNOSIS  Your caregiver will take your medical history and perform a physical exam. A sample of fluid may also be taken from the abscess to determine what is causing your infection. TREATMENT  Your caregiver may prescribe antibiotic medicines to fight the infection. However, taking antibiotics alone usually does not cure an abscess. Your caregiver may need to make a small cut (incision) in the abscess to drain the pus. In some cases, gauze is packed into the abscess to reduce pain and to continue draining the area. HOME CARE INSTRUCTIONS   Only take over-the-counter or prescription medicines for pain, discomfort, or fever as directed by your caregiver.  If you were prescribed antibiotics, take them as directed. Finish them even if you start to feel better.  If gauze is used, follow your caregiver's directions for changing the gauze.  To avoid spreading the infection:  Keep your draining abscess covered with a  bandage.  Wash your hands well.  Do not share personal care items, towels, or whirlpools with others.  Avoid skin contact with others.  Keep your skin and clothes clean around the abscess.  Keep all follow-up appointments as directed by your caregiver. SEEK MEDICAL CARE IF:   You have increased pain, swelling, redness, fluid drainage, or bleeding.  You have muscle aches, chills, or a general ill feeling.  You have a fever. MAKE SURE YOU:   Understand these instructions.  Will watch your condition.  Will get help right away if you are not doing well or get worse. Document Released: 10/07/2004 Document Revised: 06/29/2011 Document Reviewed: 03/12/2011 [employer] Patient Information 2014 [employer].  

## 2013-04-14 LAB — WOUND CULTURE
GRAM STAIN: NONE SEEN
Gram Stain: NONE SEEN

## 2013-04-25 ENCOUNTER — Ambulatory Visit: Payer: Self-pay | Admitting: Internal Medicine

## 2013-04-29 ENCOUNTER — Other Ambulatory Visit: Payer: Self-pay | Admitting: Emergency Medicine

## 2013-06-04 ENCOUNTER — Other Ambulatory Visit: Payer: Self-pay | Admitting: Internal Medicine

## 2013-06-08 ENCOUNTER — Ambulatory Visit (INDEPENDENT_AMBULATORY_CARE_PROVIDER_SITE_OTHER): Payer: BC Managed Care – PPO | Admitting: Emergency Medicine

## 2013-06-08 ENCOUNTER — Other Ambulatory Visit: Payer: Self-pay | Admitting: *Deleted

## 2013-06-08 VITALS — BP 110/76 | HR 56 | Temp 97.7°F | Resp 18 | Ht 66.0 in | Wt 216.0 lb

## 2013-06-08 DIAGNOSIS — E782 Mixed hyperlipidemia: Secondary | ICD-10-CM

## 2013-06-08 DIAGNOSIS — I10 Essential (primary) hypertension: Secondary | ICD-10-CM

## 2013-06-08 DIAGNOSIS — B372 Candidiasis of skin and nail: Secondary | ICD-10-CM

## 2013-06-08 DIAGNOSIS — R7309 Other abnormal glucose: Secondary | ICD-10-CM

## 2013-06-08 LAB — CBC WITH DIFFERENTIAL/PLATELET
BASOS ABS: 0.1 10*3/uL (ref 0.0–0.1)
BASOS PCT: 1 % (ref 0–1)
Eosinophils Absolute: 0.2 10*3/uL (ref 0.0–0.7)
Eosinophils Relative: 2 % (ref 0–5)
HCT: 46 % (ref 39.0–52.0)
Hemoglobin: 15.7 g/dL (ref 13.0–17.0)
Lymphocytes Relative: 26 % (ref 12–46)
Lymphs Abs: 2.2 10*3/uL (ref 0.7–4.0)
MCH: 33.1 pg (ref 26.0–34.0)
MCHC: 34.1 g/dL (ref 30.0–36.0)
MCV: 96.8 fL (ref 78.0–100.0)
Monocytes Absolute: 0.8 10*3/uL (ref 0.1–1.0)
Monocytes Relative: 10 % (ref 3–12)
NEUTROS PCT: 61 % (ref 43–77)
Neutro Abs: 5.1 10*3/uL (ref 1.7–7.7)
PLATELETS: 200 10*3/uL (ref 150–400)
RBC: 4.75 MIL/uL (ref 4.22–5.81)
RDW: 12.7 % (ref 11.5–15.5)
WBC: 8.4 10*3/uL (ref 4.0–10.5)

## 2013-06-08 MED ORDER — ATENOLOL 100 MG PO TABS: ORAL_TABLET | ORAL | Status: DC

## 2013-06-08 MED ORDER — FLUCONAZOLE 150 MG PO TABS: 150.0000 mg | ORAL_TABLET | ORAL | Status: DC

## 2013-06-08 MED ORDER — BENAZEPRIL-HYDROCHLOROTHIAZIDE 20-25 MG PO TABS: ORAL_TABLET | ORAL | Status: DC

## 2013-06-08 NOTE — Patient Instructions (Signed)
Smoking Cessation, Tips for Success If you are ready to quit smoking, congratulations! You have chosen to help yourself be healthier. Cigarettes bring nicotine, tar, carbon monoxide, and other irritants into your body. Your lungs, heart, and blood vessels will be able to work better without these poisons. There are many different ways to quit smoking. Nicotine gum, nicotine patches, a nicotine inhaler, or nicotine nasal spray can help with physical craving. Hypnosis, support groups, and medicines help break the habit of smoking. WHAT THINGS CAN I DO TO MAKE QUITTING EASIER?  Here are some tips to help you quit for good:  Pick a date when you will quit smoking completely. Tell all of your friends and family about your plan to quit on that date.  Do not try to slowly cut down on the number of cigarettes you are smoking. Pick a quit date and quit smoking completely starting on that day.  Throw away all cigarettes.   Clean and remove all ashtrays from your home, work, and car.   On a card, write down your reasons for quitting. Carry the card with you and read it when you get the urge to smoke.   Cleanse your body of nicotine. Drink enough water and fluids to keep your urine clear or pale yellow. Do this after quitting to flush the nicotine from your body.   Learn to predict your moods. Do not let a bad situation be your excuse to have a cigarette. Some situations in your life might tempt you into wanting a cigarette.   Never have "just one" cigarette. It leads to wanting another and another. Remind yourself of your decision to quit.   Change habits associated with smoking. If you smoked while driving or when feeling stressed, try other activities to replace smoking. Stand up when drinking your coffee. Brush your teeth after eating. Sit in a different chair when you read the paper. Avoid alcohol while trying to quit, and try to drink fewer caffeinated beverages. Alcohol and caffeine may urge  you to smoke.   Avoid foods and drinks that can trigger a desire to smoke, such as sugary or spicy foods and alcohol.   Ask people who smoke not to smoke around you.   Have something planned to do right after eating or having a cup of coffee. For example, plan to take a walk or exercise.   Try a relaxation exercise to calm you down and decrease your stress. Remember, you may be tense and nervous for the first 2 weeks after you quit, but this will pass.   Find new activities to keep your hands busy. Play with a pen, coin, or rubber band. Doodle or draw things on paper.   Brush your teeth right after eating. This will help cut down on the craving for the taste of tobacco after meals. You can also try mouthwash.   Use oral substitutes in place of cigarettes. Try using lemon drops, carrots, cinnamon sticks, or chewing gum. Keep them handy so they are available when you have the urge to smoke.   When you have the urge to smoke, try deep breathing.   Designate your home as a nonsmoking area.   If you are a heavy smoker, ask your health care provider about a prescription for nicotine chewing gum. It can ease your withdrawal from nicotine.   Reward yourself. Set aside the cigarette money you save and buy yourself something nice.   Look for support from others. Join a support group or   smoking cessation program. Ask someone at home or at work to help you with your plan to quit smoking.   Always ask yourself, "Do I need this cigarette or is this just a reflex?" Tell yourself, "Today, I choose not to smoke," or "I do not want to smoke." You are reminding yourself of your decision to quit.  Do not replace cigarette smoking with electronic cigarettes (commonly called e-cigarettes). The safety of e-cigarettes is unknown, and some may contain harmful chemicals.  If you relapse, do not give up! Plan ahead and think about what you will do the next time you get the urge to smoke.  HOW WILL  I FEEL WHEN I QUIT SMOKING? You may have symptoms of withdrawal because your body is used to nicotine (the addictive substance in cigarettes). You may crave cigarettes, be irritable, feel very hungry, cough often, get headaches, or have difficulty concentrating. The withdrawal symptoms are only temporary. They are strongest when you first quit but will go away within 10 14 days. When withdrawal symptoms occur, stay in control. Think about your reasons for quitting. Remind yourself that these are signs that your body is healing and getting used to being without cigarettes. Remember that withdrawal symptoms are easier to treat than the major diseases that smoking can cause.  Even after the withdrawal is over, expect periodic urges to smoke. However, these cravings are generally short lived and will go away whether you smoke or not. Do not smoke!  WHAT RESOURCES ARE AVAILABLE TO HELP ME QUIT SMOKING? Your health care provider can direct you to community resources or hospitals for support, which may include:  Group support.  Education.  Hypnosis.  Therapy. Document Released: 09/26/2003 Document Revised: 10/18/2012 Document Reviewed: 06/15/2012 [facility] Patient Information 2014 [place], [place]. Yeast Infection of the Skin Some yeast on the skin is normal, but sometimes it causes an infection. If you have a yeast infection, it shows up as white or light brown patches on brown skin. You can see it better in the summer on tan skin. It causes light-colored holes in your suntan. It can happen on any area of the body. This cannot be passed from person to person. HOME CARE  Scrub your skin daily with a dandruff shampoo. Your rash may take a couple weeks to get well.  Do not scratch or itch the rash. GET HELP RIGHT AWAY IF:   You get another infection from scratching. The skin may get warm, red, and may ooze fluid.  The infection does not seem to be getting better. MAKE SURE YOU:  Understand these  instructions.  Will watch your condition.  Will get help right away if you are not doing well or get worse. Document Released: 12/11/2007 Document Revised: 03/22/2011 Document Reviewed: 12/11/2007 [facility] Patient Information 2014 [place].

## 2013-06-08 NOTE — Progress Notes (Signed)
Subjective:    Patient ID: Robert [name], male    DOB: [birth year], 58 y.o.   MRN: [id number]  HPI Comments: 58 yo WM presents for 3 month F/U for HTN, Cholesterol, Pre-Dm, D. Deficient. He notes BP good at home. He is exercising more outside with yard. He is doing better with diet and has decreased tea. He has increased H2o.He notes rash around waist x 2 weeks with itch. He is not aware of any new exposures. He is outside working a lot. WBC            10.0   03/19/2013 HGB            15.4   03/19/2013 HCT            44.2   03/19/2013 PLT             208   03/19/2013 GLUCOSE          82   03/19/2013 CHOL            151   03/19/2013 TRIG             87   03/19/2013 HDL              53   03/19/2013 LDLCALC          81   03/19/2013 ALT              17   03/19/2013 AST              17   03/19/2013 NA              139   03/19/2013 K               4.3   03/19/2013 CL              103   03/19/2013 CREATININE     0.66   03/19/2013 BUN              14   03/19/2013 CO2              29   03/19/2013 TSH           1.050   03/19/2013 PSA            3.33   12/05/2012 HGBA1C          5.5   03/19/2013 MICROALBUR     0.78   12/05/2012   Hyperlipidemia  Hypertension  Rash      Medication List       This list is accurate as of: 06/08/13  9:11 AM.  Always use your most recent med list.               aspirin 81 MG tablet  Take 81 mg by mouth daily.     atenolol 100 MG tablet  Commonly known as:  TENORMIN  TAKE 1 TABLET BY MOUTH DAILY     benazepril-hydrochlorthiazide 20-25 MG per tablet  Commonly known as:  LOTENSIN HCT  TAKE 1 TABLET BY MOUTH DAILY FOR BLOOD PRESSURE     buPROPion 300 MG 24 hr tablet  Commonly known as:  WELLBUTRIN XL  TAKE ONE TABLET BY MOUTH DAILY     HYDROcodone-acetaminophen 5-325 MG per tablet  Commonly known as:  NORCO  1/2 to 1 tablet every 3 to 4 hours as needed for cough or pain     Magnesium 250 MG Tabs  Take by mouth  daily.     pravastatin 40 MG tablet  Commonly known as:   PRAVACHOL  Take 40 mg by mouth daily.     Vitamin D 2000 UNITS tablet  Take 2,000 Units by mouth 3 (three) times daily.       Allergies  Allergen Reactions  . Codeine     REACTION: Itching   Past Medical History  Diagnosis Date  . Hypertension   . Hyperlipidemia   . Vitamin D deficiency   . Elevated hemoglobin A1c   . Personal history of colonic adenomas 12/28/2007      Review of Systems  Skin: Positive for rash.  All other systems reviewed and are negative. BP 110/76  Pulse 56  Temp(Src) 97.7 F (36.5 C) (Temporal)  Resp 18  Ht 5\' 6"  (1.676 m)  Wt 216 lb (97.977 kg)  BMI 34.88 kg/m2      Objective:   Physical Exam  Nursing note and vitals reviewed. Constitutional: He is oriented to person, place, and time. He appears well-developed and well-nourished.  overweight  HENT:  Head: Normocephalic and atraumatic.  Right Ear: External ear normal.  Left Ear: External ear normal.  Nose: Nose normal.  Eyes: Conjunctivae and EOM are normal.  Neck: Normal range of motion. Neck supple. No JVD present. No thyromegaly present.  Cardiovascular: Normal rate, regular rhythm, normal heart sounds and intact distal pulses.   Pulmonary/Chest: Effort normal and breath sounds normal.  Abdominal: Soft. Bowel sounds are normal. He exhibits no distension. There is no tenderness.  Musculoskeletal: Normal range of motion. He exhibits no edema and no tenderness.  Lymphadenopathy:    He has no cervical adenopathy.  Neurological: He is alert and oriented to person, place, and time. He has normal reflexes. No cranial nerve deficit. Coordination normal.  Skin: Skin is warm and dry. Rash noted.  Around waist under fat overlap of abdomen, erythema with elevated edges and irreg borders  Psychiatric: He has a normal mood and affect. His behavior is normal. Judgment and thought content normal.          Assessment & Plan:  1.  3 month F/U for HTN, Cholesterol, Pre-Dm, D. Deficient. Needs  healthy diet, cardio QD and obtain healthy weight. Check Labs, Check BP if >130/80 call office   2. [facility]- Diflucan 150 1 weekly #2, Hygiene explained, Topical gold bond powder, w/c if SX increase

## 2013-06-09 ENCOUNTER — Encounter: Payer: Self-pay | Admitting: Internal Medicine

## 2013-06-09 LAB — BASIC METABOLIC PANEL WITH GFR
BUN: 16 mg/dL (ref 6–23)
CALCIUM: 9.2 mg/dL (ref 8.4–10.5)
CO2: 26 mEq/L (ref 19–32)
CREATININE: 0.79 mg/dL (ref 0.50–1.35)
Chloride: 102 mEq/L (ref 96–112)
GFR, Est African American: >89 mL/min
Glucose, Bld: 110 mg/dL — ABNORMAL HIGH (ref 70–99)
Potassium: 4.2 mEq/L (ref 3.5–5.3)
SODIUM: 138 meq/L (ref 135–145)

## 2013-06-09 LAB — LIPID PANEL
Cholesterol: 159 mg/dL (ref 0–200)
HDL: 58 mg/dL (ref >39)
LDL CALC: 75 mg/dL (ref 0–99)
Total CHOL/HDL Ratio: 2.7 Ratio
Triglycerides: 129 mg/dL (ref <150)
VLDL: 26 mg/dL (ref 0–40)

## 2013-06-09 LAB — HEPATIC FUNCTION PANEL
ALK PHOS: 51 U/L (ref 39–117)
ALT: 15 U/L (ref 0–53)
AST: 16 U/L (ref 0–37)
Albumin: 3.7 g/dL (ref 3.5–5.2)
BILIRUBIN INDIRECT: 0.4 mg/dL (ref 0.2–1.2)
Bilirubin, Direct: 0.1 mg/dL (ref 0.0–0.3)
Total Bilirubin: 0.5 mg/dL (ref 0.2–1.2)
Total Protein: 6.3 g/dL (ref 6.0–8.3)

## 2013-06-09 LAB — INSULIN, FASTING: INSULIN FASTING, SERUM: 75 u[IU]/mL — AB (ref 3–28)

## 2013-06-10 ENCOUNTER — Other Ambulatory Visit: Payer: Self-pay | Admitting: Emergency Medicine

## 2013-06-10 ENCOUNTER — Encounter: Payer: Self-pay | Admitting: Emergency Medicine

## 2013-06-10 MED ORDER — METFORMIN HCL ER 500 MG PO TB24: 500.0000 mg | ORAL_TABLET | Freq: Every day | ORAL | Status: DC

## 2013-07-05 ENCOUNTER — Other Ambulatory Visit: Payer: Self-pay | Admitting: Emergency Medicine

## 2013-07-05 DIAGNOSIS — R7309 Other abnormal glucose: Secondary | ICD-10-CM

## 2013-07-09 ENCOUNTER — Other Ambulatory Visit (INDEPENDENT_AMBULATORY_CARE_PROVIDER_SITE_OTHER): Payer: BC Managed Care – PPO

## 2013-07-09 DIAGNOSIS — R7309 Other abnormal glucose: Secondary | ICD-10-CM

## 2013-07-10 LAB — BASIC METABOLIC PANEL WITH GFR
BUN: 16 mg/dL (ref 6–23)
CO2: 28 mEq/L (ref 19–32)
Calcium: 8.9 mg/dL (ref 8.4–10.5)
Chloride: 100 mEq/L (ref 96–112)
Creat: 0.7 mg/dL (ref 0.50–1.35)
Glucose, Bld: 100 mg/dL — ABNORMAL HIGH (ref 70–99)
POTASSIUM: 3.9 meq/L (ref 3.5–5.3)
SODIUM: 135 meq/L (ref 135–145)

## 2013-09-21 ENCOUNTER — Other Ambulatory Visit: Payer: Self-pay | Admitting: Physician Assistant

## 2013-09-21 ENCOUNTER — Other Ambulatory Visit: Payer: Self-pay | Admitting: Internal Medicine

## 2013-09-21 ENCOUNTER — Other Ambulatory Visit: Payer: Self-pay | Admitting: Emergency Medicine

## 2013-10-27 ENCOUNTER — Other Ambulatory Visit: Payer: Self-pay | Admitting: Internal Medicine

## 2013-12-11 ENCOUNTER — Other Ambulatory Visit: Payer: Self-pay

## 2013-12-11 MED ORDER — BUPROPION HCL ER (XL) 300 MG PO TB24: 300.0000 mg | ORAL_TABLET | Freq: Every day | ORAL | Status: DC

## 2013-12-11 MED ORDER — ATENOLOL 100 MG PO TABS: ORAL_TABLET | ORAL | Status: DC

## 2013-12-13 ENCOUNTER — Encounter: Payer: Self-pay | Admitting: Internal Medicine

## 2013-12-18 ENCOUNTER — Ambulatory Visit (INDEPENDENT_AMBULATORY_CARE_PROVIDER_SITE_OTHER): Payer: Self-pay | Admitting: Physician Assistant

## 2013-12-18 ENCOUNTER — Encounter: Payer: Self-pay | Admitting: Physician Assistant

## 2013-12-18 VITALS — BP 106/68 | HR 64 | Temp 98.4°F | Resp 16 | Ht 66.0 in | Wt 219.0 lb

## 2013-12-18 DIAGNOSIS — I10 Essential (primary) hypertension: Secondary | ICD-10-CM

## 2013-12-18 DIAGNOSIS — R7309 Other abnormal glucose: Secondary | ICD-10-CM

## 2013-12-18 DIAGNOSIS — J209 Acute bronchitis, unspecified: Secondary | ICD-10-CM

## 2013-12-18 DIAGNOSIS — E669 Obesity, unspecified: Secondary | ICD-10-CM

## 2013-12-18 DIAGNOSIS — Z79899 Other long term (current) drug therapy: Secondary | ICD-10-CM

## 2013-12-18 DIAGNOSIS — E785 Hyperlipidemia, unspecified: Secondary | ICD-10-CM

## 2013-12-18 DIAGNOSIS — E559 Vitamin D deficiency, unspecified: Secondary | ICD-10-CM

## 2013-12-18 LAB — HEPATIC FUNCTION PANEL
ALK PHOS: 60 U/L (ref 39–117)
ALT: 12 U/L (ref 0–53)
AST: 15 U/L (ref 0–37)
Albumin: 3.9 g/dL (ref 3.5–5.2)
BILIRUBIN DIRECT: 0.1 mg/dL (ref 0.0–0.3)
BILIRUBIN INDIRECT: 0.4 mg/dL (ref 0.2–1.2)
BILIRUBIN TOTAL: 0.5 mg/dL (ref 0.2–1.2)
TOTAL PROTEIN: 6.6 g/dL (ref 6.0–8.3)

## 2013-12-18 LAB — LIPID PANEL
CHOL/HDL RATIO: 3.1 ratio
Cholesterol: 148 mg/dL (ref 0–200)
HDL: 47 mg/dL (ref >39)
LDL CALC: 76 mg/dL (ref 0–99)
Triglycerides: 126 mg/dL (ref <150)
VLDL: 25 mg/dL (ref 0–40)

## 2013-12-18 LAB — HEMOGLOBIN A1C
HEMOGLOBIN A1C: 5.6 % (ref <5.7)
MEAN PLASMA GLUCOSE: 114 mg/dL (ref <117)

## 2013-12-18 MED ORDER — BENZONATATE 100 MG PO CAPS: 200.0000 mg | ORAL_CAPSULE | Freq: Three times a day (TID) | ORAL | Status: AC | PRN

## 2013-12-18 MED ORDER — PREDNISONE 20 MG PO TABS: ORAL_TABLET | ORAL | Status: AC

## 2013-12-18 MED ORDER — AZITHROMYCIN 250 MG PO TABS: ORAL_TABLET | ORAL | Status: AC

## 2013-12-18 NOTE — Progress Notes (Signed)
Assessment and Plan:  1. Essential hypertension Continue Atenolol and Lotensin HCT as prescribed.  Monitor blood pressure at home.  Reminder to go to the ER if any CP, SOB, nausea, dizziness, severe HA, changes vision/speech, left arm numbness and tingling, and jaw pain. - Hepatic function panel  2. Hyperlipidemia Continue Pravastatin as prescribed.  Please follow recommended diet and exercise. Check cholesterol.  - Hepatic function panel - Lipid panel  3. Elevated hemoglobin A1c Patient stopped taking Metformin.  Please follow recommended diet and exercise.  Check A1C. - Hemoglobin A1c  4. Vitamin D deficiency Continue Vitamin D 2,000IU TID.  Check level.  5. Encounter for long-term (current) use of medications Will monitor kidney and liver function. - Hepatic function panel  6. Acute bronchitis, unspecified organism -Take Z-Pak as prescribed- azithromycin (ZITHROMAX Z-PAK) 250 MG tablet; Take 2 tablets PO on day 1, then take 1 tablet PO QDaily for 4 days.  Dispense: 6 tablet; Refill: 0 - Take Tessalon Perles as prescribed for cough- benzonatate (TESSALON PERLES) 100 MG capsule; Take 2 capsules (200 mg total) by mouth 3 (three) times daily as needed for cough (Max: 600mg  per day (6 capsules per day)).  Dispense: 120 capsule; Refill: 0 - Take Prednisone as prescribed for inflammation- predniSONE (DELTASONE) 20 MG tablet; Take 3 tablets PO QDaily for 3 days, then take 2 tablets PO QDaily for 3 days, then take 1 tablet PO QDaily for 3 days  Dispense: 18 tablet; Refill: 0  Continue diet and meds as discussed. Further disposition pending results of labs. Discussed medication effects and SE's.  Pt agreed to treatment plan. Please follow up in 4-5 months for follow up with labs.  HPI 58 y.o. male  presents for 7 month follow up with hypertension, hyperlipidemia, elevated A1C, and vitamin D.  Patient states he is also sick.  His blood pressure has been controlled at home, today their BP is  BP: 106/68 mmHg  Patient is taking Atenolol and Lotensin HCT as prescribed.   He does not workout, occupation is Furniture conservator/restorer and states he is constantly moving around and on feet all day long. He denies chest pain, shortness of breath, dizziness.   He is on cholesterol medication (Pravastatin 40mg ) and denies myalgias. His cholesterol is at goal. The cholesterol last visit was:   Lab Results  Component Value Date   CHOL 159 06/08/2013   HDL 58 06/08/2013   LDLCALC 75 06/08/2013   TRIG 129 06/08/2013   CHOLHDL 2.7 06/08/2013   He has been working on diet and not exercise for elevated A1C and insulin, and denies increased appetite, polydipsia and polyuria. Last A1C in the office was:  Lab Results  Component Value Date   HGBA1C 5.5 03/19/2013  Number of meals per day? 3   B- egg and cheese sandwich  L- varies- left overs  D- chicken, pork, vegetables, beans, potatoes twice a week  Snacks? Not really- cheeses crackers occasionally  Beverages? Water and tea occasionally  Patient is on Vitamin D supplement. - 2,000 IU TID (6,000 IU total daily) Lab Results  Component Value Date   VD25OH 91* 03/19/2013     Chest Congestion- Patient states he started having mild productive cough, head congestion, fever, sinus pressure, rhinorrhea, sore throat, SOB, wheezing and headache about 1 week ago.  States he did not pay attention to color of sputum.  States he has tried Robitussin OTC and it is not helping.  He reports that his granddaughter had bronchitis recently and he  has been around her.  He currently smokes 1/2 ppd.   GFR= >89 on 07/09/13 Current Medications:  Current Outpatient Prescriptions on File Prior to Visit  Medication Sig Dispense Refill  . aspirin 81 MG tablet Take 81 mg by mouth daily.    Marland Kitchen atenolol (TENORMIN) 100 MG tablet TAKE 1 TABLET BY MOUTH DAILY 30 tablet 3  . benazepril-hydrochlorthiazide (LOTENSIN HCT) 20-25 MG per tablet TAKE 1 TABLET BY MOUTH DAILY FOR BLOOD PRESSURE 30  tablet 2  . buPROPion (WELLBUTRIN XL) 300 MG 24 hr tablet Take 1 tablet (300 mg total) by mouth daily. 30 tablet 3  . Cholecalciferol (VITAMIN D) 2000 UNITS tablet Take 2,000 Units by mouth 3 (three) times daily.    Marland Kitchen HYDROcodone-acetaminophen (NORCO) 5-325 MG per tablet 1/2 to 1 tablet every 3 to 4 hours as needed for cough or pain 50 tablet 0  . Magnesium 250 MG TABS Take by mouth daily.    . pravastatin (PRAVACHOL) 40 MG tablet TAKE 1 TABLET BY MOUTH DAILY AT BEDTIME AS NEEDED FOR CHOLESTEROL 30 tablet 2   No current facility-administered medications on file prior to visit.   Medical History:  Past Medical History  Diagnosis Date  . Hypertension   . Hyperlipidemia   . Vitamin D deficiency   . Elevated hemoglobin A1c   . Personal history of colonic adenomas 12/28/2007   Allergies:  Allergies  Allergen Reactions  . Codeine     REACTION: Itching    ROS- Review of Systems  Constitutional: Positive for fever. Negative for chills, malaise/fatigue and diaphoresis.  HENT: Positive for congestion and sore throat. Negative for ear discharge and ear pain.        Sinus pressure, rhinorrhea  Eyes: Negative.   Respiratory: Positive for cough, sputum production, shortness of breath and wheezing.        Mild productive cough- patient did not pay attention to appearance.  Cardiovascular: Negative.   Gastrointestinal: Negative.   Musculoskeletal: Negative.   Skin: Negative.  Negative for rash.  Neurological: Negative.  Negative for dizziness.  Psychiatric/Behavioral: Negative.    Family history- Review and unchanged Social history- Review and unchanged Physical Exam: BP 106/68 mmHg  Pulse 64  Temp(Src) 98.4 F (36.9 C) (Temporal)  Resp 16  Ht 5\' 6"  (1.676 m)  Wt 219 lb (99.338 kg)  BMI 35.36 kg/m2  SpO2 92% Wt Readings from Last 3 Encounters:  12/18/13 219 lb (99.338 kg)  06/08/13 216 lb (97.977 kg)  04/11/13 224 lb 6.4 oz (101.787 kg)  Vitals Reviewed.   General Appearance:  Well nourished, in no apparent distress.  Patient has sickly appearance.  Obese. Eyes: PERRLA, conjunctiva no swelling or erythema.  No scleral icterus. Sinuses: No Frontal/maxillary tenderness ENT/Mouth: Ext aud canals clear, TMs without erythema or bulging. Pharynx erythematous and edematous without exudate.  Tonsils at +2 station bilaterally.  Hearing normal.  Neck: Supple, thyroid normal.  Respiratory: Respiratory effort normal, CTAB.  No w/r/r or stridor.  Cardio: RRR with no M/R/Gs. Brisk peripheral pulses without edema.  Abdomen: Soft, distended, + normal BS.  Non tender, no guarding, rebound or masses. Lymphatics: Non tender without lymphadenopathy.  Musculoskeletal: Full ROM, 5/5 strength, normal gait.  Skin: Warm, dry, intact without rashes, lesions, ecchymosis.  Neuro: Cranial nerves intact. Normal muscle tone, no cerebellar symptoms. Sensation intact.  Psych: Awake and oriented X 3, normal affect, Insight and Judgment appropriate.    Draken Farrior, Stephani Police, PA-C 10:13 AM [facility]

## 2013-12-18 NOTE — Patient Instructions (Signed)
-Take Tessalon Perles as prescribed for cough.  Can get partial fill. -Take Prednisone as prescribed for inflammation -Take Z-Pak as prescribed. -Make sure you are drinking plenty of water to stay hydrated. Continue all other medications as prescribed.  Please follow up in 4-5 months.  If you are not feeling better in 10-14 days, then please call the office.  Acute Bronchitis Bronchitis is inflammation of the airways that extend from the windpipe into the lungs (bronchi). The inflammation often causes mucus to develop. This leads to a cough, which is the most common symptom of bronchitis.  In acute bronchitis, the condition usually develops suddenly and goes away over time, usually in a couple weeks. Smoking, allergies, and asthma can make bronchitis worse. Repeated episodes of bronchitis may cause further lung problems.  CAUSES Acute bronchitis is most often caused by the same virus that causes a cold. The virus can spread from person to person (contagious) through coughing, sneezing, and touching contaminated objects. SIGNS AND SYMPTOMS   Cough.   Fever.   Coughing up mucus.   Body aches.   Chest congestion.   Chills.   Shortness of breath.   Sore throat.  DIAGNOSIS  Acute bronchitis is usually diagnosed through a physical exam. Your health care provider will also ask you questions about your medical history. Tests, such as chest X-rays, are sometimes done to rule out other conditions.  TREATMENT  Acute bronchitis usually goes away in a couple weeks. Oftentimes, no medical treatment is necessary. Medicines are sometimes given for relief of fever or cough. Antibiotic medicines are usually not needed but may be prescribed in certain situations. In some cases, an inhaler may be recommended to help reduce shortness of breath and control the cough. A cool mist vaporizer may also be used to help thin bronchial secretions and make it easier to clear the chest.  HOME CARE  INSTRUCTIONS  Get plenty of rest.   Drink enough fluids to keep your urine clear or pale yellow (unless you have a medical condition that requires fluid restriction). Increasing fluids may help thin your respiratory secretions (sputum) and reduce chest congestion, and it will prevent dehydration.   Take medicines only as directed by your health care provider.  If you were prescribed an antibiotic medicine, finish it all even if you start to feel better.  Avoid smoking and secondhand smoke. Exposure to cigarette smoke or irritating chemicals will make bronchitis worse. If you are a smoker, consider using nicotine gum or skin patches to help control withdrawal symptoms. Quitting smoking will help your lungs heal faster.   Reduce the chances of another bout of acute bronchitis by washing your hands frequently, avoiding people with cold symptoms, and trying not to touch your hands to your mouth, nose, or eyes.   Keep all follow-up visits as directed by your health care provider.  SEEK MEDICAL CARE IF: Your symptoms do not improve after 1 week of treatment.  SEEK IMMEDIATE MEDICAL CARE IF:  You develop an increased fever or chills.   You have chest pain.   You have severe shortness of breath.  You have bloody sputum.   You develop dehydration.  You faint or repeatedly feel like you are going to pass out.  You develop repeated vomiting.  You develop a severe headache. MAKE SURE YOU:   Understand these instructions.  Will watch your condition.  Will get help right away if you are not doing well or get worse. Document Released: 02/05/2004 Document Revised: 05/14/2013  Document Reviewed: 06/20/2012 [facility] Patient Information 2015 [place], [place]. This information is not intended to replace advice given to you by your health care provider. Make sure you discuss any questions you have with your health care provider.

## 2014-01-20 ENCOUNTER — Other Ambulatory Visit: Payer: Self-pay | Admitting: Internal Medicine

## 2014-05-04 ENCOUNTER — Other Ambulatory Visit: Payer: Self-pay | Admitting: Internal Medicine

## 2014-05-06 ENCOUNTER — Encounter: Payer: Self-pay | Admitting: *Deleted

## 2014-06-25 ENCOUNTER — Other Ambulatory Visit: Payer: Self-pay | Admitting: Internal Medicine

## 2014-06-25 ENCOUNTER — Other Ambulatory Visit: Payer: Self-pay | Admitting: *Deleted

## 2014-06-25 MED ORDER — ATENOLOL 100 MG PO TABS: ORAL_TABLET | ORAL | Status: DC

## 2014-06-25 MED ORDER — BENAZEPRIL-HYDROCHLOROTHIAZIDE 20-25 MG PO TABS: 1.0000 | ORAL_TABLET | Freq: Every day | ORAL | Status: DC

## 2014-07-03 ENCOUNTER — Encounter: Payer: Self-pay | Admitting: Internal Medicine

## 2014-07-03 ENCOUNTER — Ambulatory Visit (INDEPENDENT_AMBULATORY_CARE_PROVIDER_SITE_OTHER): Payer: Self-pay | Admitting: Internal Medicine

## 2014-07-03 VITALS — BP 112/76 | HR 52 | Temp 97.5°F | Resp 16 | Ht 66.5 in | Wt 223.6 lb

## 2014-07-03 DIAGNOSIS — E559 Vitamin D deficiency, unspecified: Secondary | ICD-10-CM

## 2014-07-03 DIAGNOSIS — E785 Hyperlipidemia, unspecified: Secondary | ICD-10-CM

## 2014-07-03 DIAGNOSIS — Z79899 Other long term (current) drug therapy: Secondary | ICD-10-CM

## 2014-07-03 DIAGNOSIS — I10 Essential (primary) hypertension: Secondary | ICD-10-CM

## 2014-07-03 DIAGNOSIS — R7309 Other abnormal glucose: Secondary | ICD-10-CM

## 2014-07-03 MED ORDER — ATENOLOL 100 MG PO TABS: ORAL_TABLET | ORAL | Status: DC

## 2014-07-03 MED ORDER — BENAZEPRIL-HYDROCHLOROTHIAZIDE 20-25 MG PO TABS: 1.0000 | ORAL_TABLET | Freq: Every day | ORAL | Status: DC

## 2014-07-03 NOTE — Progress Notes (Deleted)
   Subjective:    Patient ID: Robert [name], male    DOB: [birth year], 59 y.o.   MRN: [id number]  HPI    Review of Systems     Objective:   Physical Exam        Assessment & Plan:

## 2014-07-03 NOTE — Progress Notes (Signed)
Patient ID: Robert [name], male   DOB: June 25, 1955, 59 y.o.   MRN: [id number]   This very nice 59 y.o.male presents for 3 month follow up with Hypertension, Hyperlipidemia, Pre-Diabetes and Vitamin D Deficiency.    Patient is treated for HTN since 2000 & BP has been controlled at home. Today's BP: 112/76 mmHg. Patient has had no complaints of any cardiac type chest pain, palpitations, dyspnea/orthopnea/PND, dizziness, claudication, or dependent edema.   Hyperlipidemia is controlled with diet & meds. Patient denies myalgias or other med SE's. Last Lipids were at goal on meds , but since patient has been off of his pravastatin x 1-2 months. Chol 148; HDL 47; LDL Chol 76; Trig126 on 12/18/2013.    Also, the patient has history of Morbid Obesity (BMI 35.55) and consequent PreDiabetes and has had no symptoms of reactive hypoglycemia, diabetic polys, paresthesias or visual blurring.  Last A1c was  5.6% on 12/18/2013.    Further, the patient also has history of Vitamin D Deficiency  Of 21 in 2008 and supplements vitamin D without any suspected side-effects. Last vitamin D was 91 in Mar 2015.      Medication Sig  . aspirin 81 MG tablet Take 81 mg by mouth daily.  Marland Kitchen VITAMIN D 2000 UNITS  Take 2,000 Units by mouth 3  times daily.  . Magnesium 250 MG  Take  daily.  Marland Kitchen atenolol  100 MG TAKE 1 TAB DAILY  . benazepril-hctz 20-25  Take 1 tab daily. for blood pressure  . buPROPion  XL 300 MG  Take 1 tab  daily. (Patient not taking: Reported on 07/03/2014)  . pravastatin  40 MG tablet TAKE 1 TAB DAILY -not taking   Allergies  Allergen Reactions  . Codeine     REACTION: Itching   PMHx:   Past Medical History  Diagnosis Date  . Hypertension   . Hyperlipidemia   . Vitamin D deficiency   . Elevated hemoglobin A1c   . Personal history of colonic adenomas 12/28/2007   Immunization History  Administered Date(s) Administered  . Pneumococcal Polysaccharide-23 01/11/1997  . Td 01/12/2004  . Zoster 01/12/2008    No past surgical history on file. FHx:    Reviewed / unchanged  SHx:    Reviewed / unchanged  Systems Review:  Constitutional: Denies fever, chills, wt changes, headaches, insomnia, fatigue, night sweats, change in appetite. Eyes: Denies redness, blurred vision, diplopia, discharge, itchy, watery eyes.  ENT: Denies discharge, congestion, post nasal drip, epistaxis, sore throat, earache, hearing loss, dental pain, tinnitus, vertigo, sinus pain, snoring.  CV: Denies chest pain, palpitations, irregular heartbeat, syncope, dyspnea, diaphoresis, orthopnea, PND, claudication or edema. Respiratory: denies cough, dyspnea, DOE, pleurisy, hoarseness, laryngitis, wheezing.  Gastrointestinal: Denies dysphagia, odynophagia, heartburn, reflux, water brash, abdominal pain or cramps, nausea, vomiting, bloating, diarrhea, constipation, hematemesis, melena, hematochezia  or hemorrhoids. Genitourinary: Denies dysuria, frequency, urgency, nocturia, hesitancy, discharge, hematuria or flank pain. Musculoskeletal: Denies arthralgias, myalgias, stiffness, jt. swelling, pain, limping or strain/sprain.  Skin: Denies pruritus, rash, hives, warts, acne, eczema or change in skin lesion(s). Neuro: No weakness, tremor, incoordination, spasms, paresthesia or pain. Psychiatric: Denies confusion, memory loss or sensory loss. Endo: Denies change in weight, skin or hair change.  Heme/Lymph: No excessive bleeding, bruising or enlarged lymph nodes.  Physical Exam  BP 112/76   Pulse 52  Temp 97.5 F )  Resp 16  Ht 5' 6.5" Wt 223 lb 9.6 oz     BMI 35.55   Appears over  nourished and in no distress. Eyes: PERRLA, EOMs, conjunctiva no swelling or erythema. Sinuses: No frontal/maxillary tenderness ENT/Mouth: EAC's clear, TM's nl w/o erythema, bulging. Nares clear w/o erythema, swelling, exudates. Oropharynx clear without erythema or exudates. Oral hygiene is good. Tongue normal, non obstructing. Hearing intact.  Neck:  Supple. Thyroid nl. Car 2+/2+ without bruits, nodes or JVD. Chest: Respirations nl with BS clear & equal w/o rales, rhonchi, wheezing or stridor.  Cor: Heart sounds normal w/ regular rate and rhythm without sig. murmurs, gallops, clicks, or rubs. Peripheral pulses normal and equal  without edema.  Abdomen: Soft & bowel sounds normal. Non-tender w/o guarding, rebound, hernias, masses, or organomegaly.  Lymphatics: Unremarkable.  Musculoskeletal: Full ROM all peripheral extremities, joint stability, 5/5 strength, and normal gait.  Skin: Warm, dry without exposed rashes, lesions or ecchymosis apparent.  Neuro: Cranial nerves intact, reflexes equal bilaterally. Sensory-motor testing grossly intact. Tendon reflexes grossly intact.  Pysch: Alert & oriented x 3.  Insight and judgement nl & appropriate. No ideations.  Assessment and Plan:  1. Essential hypertension  - benazepril-hydrochlorthiazide (LOTENSIN HCT) 20-25 MG per tablet; Take 1 tablet by mouth daily. for blood pressure  Dispense: 90 tablet; Refill: 1 - atenolol (TENORMIN) 100 MG tablet; TAKE 1 TABLET BY MOUTH DAILY  Dispense: 90 tablet; Refill: 1  2. Hyperlipidemia  - trial on very low cholesterol diet   3. Elevated hemoglobin A1c   4. Vitamin D deficiency   5. Medication management   Recommended regular exercise, BP monitoring, weight control, and discussed med and SE's. Deferred any labs today as patient ha lost his health Insurance coverage thru work and also has been off of his Chol Meds (Pravastatin). Over 30 minutes of exam, counseling, chart review was performed

## 2014-07-03 NOTE — Patient Instructions (Signed)

## 2014-07-18 ENCOUNTER — Encounter: Payer: Self-pay | Admitting: Internal Medicine

## 2014-10-01 ENCOUNTER — Other Ambulatory Visit: Payer: Self-pay | Admitting: Internal Medicine

## 2014-12-31 ENCOUNTER — Encounter: Payer: Self-pay | Admitting: Internal Medicine

## 2015-01-07 ENCOUNTER — Ambulatory Visit: Payer: Self-pay | Admitting: Internal Medicine

## 2015-06-05 ENCOUNTER — Other Ambulatory Visit: Payer: Self-pay | Admitting: Internal Medicine

## 2015-06-17 ENCOUNTER — Encounter: Payer: Self-pay | Admitting: Internal Medicine

## 2015-06-17 ENCOUNTER — Ambulatory Visit (INDEPENDENT_AMBULATORY_CARE_PROVIDER_SITE_OTHER): Payer: Self-pay | Admitting: Internal Medicine

## 2015-06-17 ENCOUNTER — Other Ambulatory Visit: Payer: Self-pay | Admitting: *Deleted

## 2015-06-17 VITALS — BP 144/90 | HR 56 | Temp 97.7°F | Resp 16 | Ht 66.5 in | Wt 231.6 lb

## 2015-06-17 DIAGNOSIS — E349 Endocrine disorder, unspecified: Secondary | ICD-10-CM

## 2015-06-17 DIAGNOSIS — I10 Essential (primary) hypertension: Secondary | ICD-10-CM

## 2015-06-17 DIAGNOSIS — R7309 Other abnormal glucose: Secondary | ICD-10-CM

## 2015-06-17 DIAGNOSIS — E785 Hyperlipidemia, unspecified: Secondary | ICD-10-CM

## 2015-06-17 DIAGNOSIS — E559 Vitamin D deficiency, unspecified: Secondary | ICD-10-CM

## 2015-06-17 DIAGNOSIS — E291 Testicular hypofunction: Secondary | ICD-10-CM

## 2015-06-17 DIAGNOSIS — Z125 Encounter for screening for malignant neoplasm of prostate: Secondary | ICD-10-CM

## 2015-06-17 DIAGNOSIS — Z79899 Other long term (current) drug therapy: Secondary | ICD-10-CM

## 2015-06-17 LAB — CBC WITH DIFFERENTIAL/PLATELET
BASOS ABS: 0 {cells}/uL (ref 0–200)
Basophils Relative: 0 %
EOS PCT: 1 %
Eosinophils Absolute: 87 cells/uL (ref 15–500)
HCT: 45.6 % (ref 38.5–50.0)
Hemoglobin: 15.6 g/dL (ref 13.2–17.1)
LYMPHS PCT: 20 %
Lymphs Abs: 1740 cells/uL (ref 850–3900)
MCH: 33.1 pg — AB (ref 27.0–33.0)
MCHC: 34.2 g/dL (ref 32.0–36.0)
MCV: 96.6 fL (ref 80.0–100.0)
MONOS PCT: 9 %
MPV: 9.7 fL (ref 7.5–12.5)
Monocytes Absolute: 783 cells/uL (ref 200–950)
NEUTROS ABS: 6090 {cells}/uL (ref 1500–7800)
NEUTROS PCT: 70 %
PLATELETS: 172 10*3/uL (ref 140–400)
RBC: 4.72 MIL/uL (ref 4.20–5.80)
RDW: 13 % (ref 11.0–15.0)
WBC: 8.7 10*3/uL (ref 3.8–10.8)

## 2015-06-17 LAB — BASIC METABOLIC PANEL WITH GFR
BUN: 7 mg/dL (ref 7–25)
CO2: 27 mmol/L (ref 20–31)
CREATININE: 0.58 mg/dL — AB (ref 0.70–1.33)
Calcium: 8.6 mg/dL (ref 8.6–10.3)
Chloride: 98 mmol/L (ref 98–110)
GFR, Est Non African American: >89 mL/min (ref >=60)
GLUCOSE: 81 mg/dL (ref 65–99)
Potassium: 4 mmol/L (ref 3.5–5.3)
Sodium: 134 mmol/L — ABNORMAL LOW (ref 135–146)

## 2015-06-17 LAB — HEPATIC FUNCTION PANEL
ALBUMIN: 3.8 g/dL (ref 3.6–5.1)
ALT: 15 U/L (ref 9–46)
AST: 16 U/L (ref 10–35)
Alkaline Phosphatase: 59 U/L (ref 40–115)
BILIRUBIN TOTAL: 0.8 mg/dL (ref 0.2–1.2)
Bilirubin, Direct: 0.2 mg/dL (ref <=0.2)
Indirect Bilirubin: 0.6 mg/dL (ref 0.2–1.2)
TOTAL PROTEIN: 6.2 g/dL (ref 6.1–8.1)

## 2015-06-17 LAB — TSH: TSH: 0.75 m[IU]/L (ref 0.40–4.50)

## 2015-06-17 MED ORDER — ATENOLOL 100 MG PO TABS: ORAL_TABLET | ORAL | Status: DC

## 2015-06-17 MED ORDER — BENAZEPRIL-HYDROCHLOROTHIAZIDE 20-25 MG PO TABS: 1.0000 | ORAL_TABLET | Freq: Every day | ORAL | Status: DC

## 2015-06-17 NOTE — Patient Instructions (Signed)

## 2015-06-17 NOTE — Progress Notes (Signed)
Patient ID: Robert [name], male   DOB: 22-May-1955, 60 y.o.   MRN: [id number]  [facility]                       Unk [name], M.D.        Uvaldo Bristle. Silverio Lay, P.A.-C       Starlyn Skeans, P.A.-C   [facility]                [address] [place], [place] [id number] Telephone [phone] Telefax [phone] _________________________________________________________________________     This very nice 60 y.o. MWM presents for belated follow up with Hypertension, Hyperlipidemia, Pre-Diabetes and Vitamin D Deficiency.      Patient is treated for HTN since circa 2000 & BP has been controlled at home. Today's BP is sl elevated at 144/90 as he has recently been out of his Atenolol.  Patient has had no complaints of any cardiac type chest pain, palpitations, dyspnea/orthopnea/PND, dizziness, claudication, or dependent edema.     Hyperlipidemia was controlled with diet & meds, but he relates that he has been off of his pravastatin for a "long" time.   Last Lipids were at goal on medications with total Chol 148, HDL 47, Trig 126 and LDL 76 in Dec 2015.       Also, the patient has history of PreDiabetes and has had no symptoms of reactive hypoglycemia, diabetic polys, paresthesias or visual blurring.  Last A1c was 5.6% in Dec 2015.       Further, the patient also has history of Vitamin D Deficiency of "21" in 2008  and supplements vitamin D without any suspected side-effects. Last vitamin D was 91 in Mar 2015.     Medication Sig  . aspirin 81 MG Take 81 mg by mouth daily.  Marland Kitchen VITAMIN D 2000 UNITS Take 2,000 Units by mouth 3 (three) times daily.  . Magnesium 250 MG  Take by mouth daily.  . benazepril-hctz  20-25  Take 1 tablet by mouth daily. for blood pressure  . Atenolol 100 MG  TAKE 1 TABLET BY MOUTH DAILY  . buPROPion  XL 300 MG  Take 1 tablet (300 mg total) by mouth daily. (Patient not taking: Reported on  07/03/2014)  . pravastatin  40 MG - OFF TAKE 1 TABLET BY MOUTH DAILY AT BEDTIME(Patient not taking: Reported on 07/03/2014)   PMHx:   Past Medical History  Diagnosis Date  . Hypertension   . Hyperlipidemia   . Vitamin D deficiency   . Elevated hemoglobin A1c   . Personal history of colonic adenomas 12/28/2007   Immunization History  Administered Date(s) Administered  . Pneumococcal Polysaccharide-23 01/11/1997  . Td 01/12/2004  . Zoster 01/12/2008   FHx:    Reviewed / unchanged  SHx:    Reviewed / unchanged  Systems Review:  Constitutional: Denies fever, chills, wt changes, headaches, insomnia, fatigue, night sweats, change in appetite. Eyes: Denies redness, blurred vision, diplopia, discharge, itchy, watery eyes.  ENT: Denies discharge, congestion, post nasal drip, epistaxis, sore throat, earache, hearing loss, dental pain, tinnitus, vertigo, sinus pain, snoring.  CV: Denies chest pain, palpitations, irregular heartbeat, syncope, dyspnea, diaphoresis, orthopnea, PND, claudication or edema. Respiratory: denies cough, dyspnea, DOE, pleurisy, hoarseness, laryngitis, wheezing.  Gastrointestinal: Denies dysphagia, odynophagia, heartburn, reflux, water brash, abdominal pain or cramps, nausea, vomiting, bloating, diarrhea, constipation, hematemesis,  melena, hematochezia  or hemorrhoids. Genitourinary: Denies dysuria, frequency, urgency, nocturia, hesitancy, discharge, hematuria or flank pain. Musculoskeletal: Denies arthralgias, myalgias, stiffness, jt. swelling, pain, limping or strain/sprain.  Skin: Denies pruritus, rash, hives, warts, acne, eczema or change in skin lesion(s). Neuro: No weakness, tremor, incoordination, spasms, paresthesia or pain. Psychiatric: Denies confusion, memory loss or sensory loss. Endo: Denies change in weight, skin or hair change.  Heme/Lymph: No excessive bleeding, bruising or enlarged lymph nodes.  Physical Exam  BP 144/90 mmHg  Pulse 56  Temp(Src)  97.7 F (36.5 C)  Resp 16  Ht 5' 6.5" (1.689 m)  Wt 231 lb 9.6 oz (105.053 kg)  BMI 36.83 kg/m2  Appears over nourished with central obesity and in no distress.  Eyes: PERRLA, EOMs, conjunctiva no swelling or erythema. Sinuses: No frontal/maxillary tenderness ENT/Mouth: EAC's clear, TM's nl w/o erythema, bulging. Nares clear w/o erythema, swelling, exudates. Oropharynx clear without erythema or exudates. Oral hygiene is good. Tongue normal, non obstructing. Hearing intact.  Neck: Supple. Thyroid nl. Car 2+/2+ without bruits, nodes or JVD. Chest: Respirations nl with BS clear & equal w/o rales, rhonchi, wheezing or stridor.  Cor: Heart sounds normal w/ regular rate and rhythm without sig. murmurs, gallops, clicks, or rubs. Peripheral pulses normal and equal  without edema.  Abdomen: Soft & bowel sounds normal. Non-tender w/o guarding, rebound, hernias, masses, or organomegaly.  Lymphatics: Unremarkable.  Musculoskeletal: Full ROM all peripheral extremities, joint stability, 5/5 strength, and normal gait.  Skin: Warm, dry without exposed rashes, lesions or ecchymosis apparent.  Neuro: Cranial nerves intact, reflexes equal bilaterally. Sensory-motor testing grossly intact. Tendon reflexes grossly intact.  Pysch: Alert & oriented x 3.  Insight and judgement nl & appropriate. No ideations.  Assessment and Plan:  1. Essential hypertension   2. Hyperlipidemia   3. Elevated hemoglobin A1c   4. Vitamin D deficiency   5. Testosterone deficiency   6. Medication management  - PSA - CBC with Differential/Platelet - TSH - BASIC METABOLIC PANEL WITH GFR - Hepatic function panel   Recommended regular exercise, BP monitoring, weight control, and discussed med and SE's. Recommended labs to assess and monitor clinical status. Further disposition pending results of labs. Over 30 minutes of exam, counseling, chart review was performed

## 2015-06-18 LAB — PSA: PSA: 5.24 ng/mL — AB (ref <=4.00)

## 2015-08-11 ENCOUNTER — Other Ambulatory Visit: Payer: Self-pay | Admitting: Physician Assistant

## 2015-08-11 MED ORDER — BISOPROLOL-HYDROCHLOROTHIAZIDE 10-6.25 MG PO TABS: 1.0000 | ORAL_TABLET | Freq: Every day | ORAL | 1 refills | Status: DC

## 2015-08-13 ENCOUNTER — Other Ambulatory Visit: Payer: Self-pay | Admitting: *Deleted

## 2015-12-18 ENCOUNTER — Ambulatory Visit: Payer: Self-pay | Admitting: Internal Medicine

## 2015-12-20 ENCOUNTER — Other Ambulatory Visit: Payer: Self-pay | Admitting: Internal Medicine

## 2015-12-20 DIAGNOSIS — I10 Essential (primary) hypertension: Secondary | ICD-10-CM

## 2016-01-13 ENCOUNTER — Ambulatory Visit (INDEPENDENT_AMBULATORY_CARE_PROVIDER_SITE_OTHER): Payer: Self-pay | Admitting: Internal Medicine

## 2016-01-13 ENCOUNTER — Encounter: Payer: Self-pay | Admitting: Internal Medicine

## 2016-01-13 VITALS — BP 116/64 | HR 64 | Temp 97.7°F | Resp 16 | Ht 66.5 in | Wt 231.4 lb

## 2016-01-13 DIAGNOSIS — R972 Elevated prostate specific antigen [PSA]: Secondary | ICD-10-CM

## 2016-01-13 DIAGNOSIS — Z79899 Other long term (current) drug therapy: Secondary | ICD-10-CM

## 2016-01-13 DIAGNOSIS — E782 Mixed hyperlipidemia: Secondary | ICD-10-CM

## 2016-01-13 DIAGNOSIS — E559 Vitamin D deficiency, unspecified: Secondary | ICD-10-CM

## 2016-01-13 DIAGNOSIS — J209 Acute bronchitis, unspecified: Secondary | ICD-10-CM

## 2016-01-13 DIAGNOSIS — I10 Essential (primary) hypertension: Secondary | ICD-10-CM

## 2016-01-13 DIAGNOSIS — J0140 Acute pansinusitis, unspecified: Secondary | ICD-10-CM

## 2016-01-13 MED ORDER — AZITHROMYCIN 250 MG PO TABS: ORAL_TABLET | ORAL | 1 refills | Status: DC

## 2016-01-13 MED ORDER — PREDNISONE 20 MG PO TABS: ORAL_TABLET | ORAL | 0 refills | Status: DC

## 2016-01-13 NOTE — Progress Notes (Signed)
[facility] Unk [name], M.D.        Uvaldo Bristle. Silverio Lay, P.A.-C       Starlyn Skeans, P.A.-C  [facility]                [address] [place], [place] [id number] Telephone [phone] Telefax [phone] ______________________________________________________________________     This very nice  61 yo MWM presents for 6 month follow up with Hypertension, Hyperlipidemia, Pre-Diabetes and Vitamin D Deficiency. Also note patient's PSA has risen from 3.33 three years ago to 5.25 in  June 2017. Today, he's also c/o head/chest congestin and yellow-green nasal secretions and sputum.      Patient is treated for HTN (2000) & BP has been controlled at home. Today's BP is at goal -  116/64. Patient has had no complaints of any cardiac type chest pain, palpitations, dyspnea/orthopnea/PND, dizziness, claudication, or dependent edema.     Hyperlipidemia is controlled with diet.  Last Lipids were at goal: Lab Results  Component Value Date   CHOL 148 12/18/2013   HDL 47 12/18/2013   LDLCALC 76 12/18/2013   TRIG 126 12/18/2013   CHOLHDL 3.1 12/18/2013      Also, the patient has history of Morbid Obesity (BMI 36+) and consequent PreDiabetes in 2009 with A1c 5.7% and 5.8% in 2012. He denies symptoms of reactive hypoglycemia, diabetic polys, paresthesias or visual blurring.  Last A1c was at goal  Lab Results  Component Value Date   HGBA1C 5.6 12/18/2013      Further, the patient also has history of Vitamin D Deficiency in 2008 of "21" and supplements vitamin D without any suspected side-effects. Last vitamin D was at goal: Lab Results  Component Value Date   VD25OH 73 (H) 03/19/2013   Current Outpatient Prescriptions on File Prior to Visit  Medication Sig  . aspirin 81 MG tablet Take 81 mg by mouth daily.  . benazepril-hydrochlorthiazide (LOTENSIN HCT) 20-25 MG tablet TAKE ONE TABLET BY MOUTH DAILY FOR BLOOD  PRESSURE  . bisoprolol-hydrochlorothiazide (ZIAC) 10-6.25 MG tablet Take 1 tablet by mouth daily.  . Cholecalciferol (VITAMIN D) 2000 UNITS tablet Take 2,000 Units by mouth 3 (three) times daily.  . Magnesium 250 MG TABS Take by mouth daily.   No current facility-administered medications on file prior to visit.    Allergies  Allergen Reactions  . Codeine     REACTION: Itching   PMHx:   Past Medical History:  Diagnosis Date  . Elevated hemoglobin A1c   . Hyperlipidemia   . Hypertension   . Personal history of colonic adenomas 12/28/2007  . Vitamin D deficiency    Immunization History  Administered Date(s) Administered  . Pneumococcal Polysaccharide-23 01/11/1997  . Td 01/12/2004  . Zoster 01/12/2008   No past surgical history on file.   FHx:    Reviewed / unchanged  SHx:    Reviewed / unchanged  Systems Review:  Constitutional: Denies fever, chills, wt changes, headaches, insomnia, fatigue, night sweats, change in appetite. Eyes: Denies redness, blurred vision, diplopia, discharge, itchy, watery eyes.  ENT: Denies discharge, congestion, post nasal drip, epistaxis, sore throat, earache, hearing loss, dental pain, tinnitus, vertigo, sinus pain, snoring.  CV: Denies chest pain, palpitations, irregular heartbeat, syncope, dyspnea, diaphoresis, orthopnea, PND, claudication or edema. Respiratory: denies cough, dyspnea, DOE, pleurisy, hoarseness, laryngitis, wheezing.  Gastrointestinal: Denies dysphagia, odynophagia, heartburn, reflux, water  brash, abdominal pain or cramps, nausea, vomiting, bloating, diarrhea, constipation, hematemesis, melena, hematochezia  or hemorrhoids. Genitourinary: Denies dysuria, frequency, urgency, nocturia, hesitancy, discharge, hematuria or flank pain. Musculoskeletal: Denies arthralgias, myalgias, stiffness, jt. swelling, pain, limping or strain/sprain.  Skin: Denies pruritus, rash, hives, warts, acne, eczema or change in skin lesion(s). Neuro: No  weakness, tremor, incoordination, spasms, paresthesia or pain. Psychiatric: Denies confusion, memory loss or sensory loss. Endo: Denies change in weight, skin or hair change.  Heme/Lymph: No excessive bleeding, bruising or enlarged lymph nodes.  Physical Exam  BP 116/64   Pulse 64   Temp 97.7 F (36.5 C)   Resp 16   Ht 5' 6.5" (1.689 m)   Wt 231 lb 6.4 oz (105 kg)   BMI 36.79 kg/m   Appears over nourished and in no distress. Brassy congested cough. No stridor.   Eyes: PERRLA, EOMs, conjunctiva no swelling or erythema. Sinuses: Bilat. frontal/maxillary tenderness ENT/Mouth: EAC's clear, TM's nl w/o erythema, bulging. Nares clear w/o erythema, swelling, exudates. Oropharynx clear without erythema or exudates. Oral hygiene is good. Tongue normal, non obstructing. Hearing intact.  Neck: Supple. Thyroid nl. Car 2+/2+ without bruits, nodes or JVD. Chest: Respirations nl with BS equal and scattered rales and rhonchi w/o wheezing or stridor.  Cor: Heart sounds normal w/ regular rate and rhythm without sig. murmurs, gallops, clicks, or rubs. Peripheral pulses normal and equal  without edema.  Abdomen: Soft, rotund & bowel sounds normal. Non-tender w/o guarding, rebound, hernias, masses, or organomegaly.  Lymphatics: Unremarkable.  Musculoskeletal: Full ROM all peripheral extremities, joint stability, 5/5 strength, and normal gait.  Skin: Warm, dry without exposed rashes, lesions or ecchymosis apparent.  Neuro: Cranial nerves intact, reflexes equal bilaterally. Sensory-motor testing grossly intact. Tendon reflexes grossly intact.  Pysch: Alert & oriented x 3.  Insight and judgement nl & appropriate. No ideations.  Assessment and Plan:   1. Essential hypertension  - Continue medication, monitor blood pressure at home.  - Continue DASH diet. Reminder to go to the ER if any CP,  SOB, nausea, dizziness, severe HA, changes vision/speech,  left arm numbness and tingling and jaw pain.  2.  Mixed hyperlipidemia  - Continue diet/meds, exercise,& lifestyle modifications.  - defer cholesterol/ labs for lack of insurance   3. Vitamin D deficiency  - Continue supplementation.  4. Elevated PSA  - PSA, Total and Free  5. Acute non-recurrent pansinusitis  - predniSONE (DELTASONE) 20 MG tablet; 1 tab 3 x day for 3 days, then 1 tab 2 x day for 3 days, then 1 tab 1 x day for 5 days  Dispense: 20 tablet; Refill: 0 - azithromycin (ZITHROMAX) 250 MG tablet; Take 2 tablets (500 mg) on  Day 1,  followed by 1 tablet (250 mg) once daily on Days 2 through 5.  Dispense: 6 each; Refill: 1  6. Acute bronchitis, unspecified organism  - predniSONE (DELTASONE) 20 MG tablet; 1 tab 3 x day for 3 days, then 1 tab 2 x day for 3 days, then 1 tab 1 x day for 5 days  Dispense: 20 tablet; Refill: 0 - azithromycin (ZITHROMAX) 250 MG tablet; Take 2 tablets (500 mg) on  Day 1,  followed by 1 tablet (250 mg) once daily on Days 2 through 5.  Dispense: 6 each; Refill: 1  7. Medication management  - BASIC METABOLIC PANEL WITH GFR       Recommended regular exercise, BP monitoring, weight control, and discussed med and SE's. Recommended labs to assess  and monitor clinical status. Further disposition pending results of labs. Over 30 minutes of exam, counseling, chart review was performed

## 2016-01-13 NOTE — Patient Instructions (Addendum)
Prostate-Specific Antigen Test Why am I having this test? The prostate-specific antigen (PSA) test is performed to determine how much PSA you have in your blood. PSA is a type of protein that is normally present in the prostate gland. Certain conditions can cause PSA blood levels to increase, such as:  Infection in the prostate (prostatitis).  Enlargement of the prostate (hypertrophy).  Prostate cancer. Because PSA levels increase greatly from prostate cancer, this test can be used to confirm a diagnosis of prostate cancer. It may also be used to monitor treatment for prostate cancer and to watch for a return of prostate cancer after treatment has finished. This test has a very high false-positive rate. Therefore, routine PSA screening for all men is no longer recommended. A false-positive result is incorrect because it indicates a condition or finding is present when it is not. What kind of sample is taken? A blood sample is required for this test. It is usually collected by inserting a needle into a vein or by sticking a finger with a small needle. How do I prepare for this test? There is no preparation required for this test. However, there are factors that can affect the results of a PSA test. To get the most accurate results:  Avoid having a rectal exam within several hours before having your blood drawn for this test.  Avoid having any procedures performed on the prostate gland within 6 weeks of having this test.  Avoid ejaculating within 24 hours of having this test.  Tell your health care provider if you had a recent urinary tract infection (UTI).  Tell your health care provider if you are taking medicines to assist with hair growth, such as finasteride.  Tell your health care provider if you have been exposed to a medicine called diethylstilbestrol. Let your health care provider know if any of these factors apply to you. You may be asked to reschedule the test. What are the  reference ranges? Reference ranges are established after testing a large group of people. Reference ranges may vary among different people, labs, and hospitals. It is your responsibility to obtain your test results. Ask the lab or department performing the test when and how you will get your results.  Low: 0-2.5 ng/mL.  Slightly to moderately elevated: 2.6-10.0 ng/mL.  Moderately elevated: 10.0-19.9 ng/mL.  Significantly elevated: 20 ng/mL or greater. What do the results mean? PSA test results greater than 4 ng/mL are found in the majority of men with prostate cancer. If your test result is above this level, this can indicate an increased risk for prostate cancer. Increased PSA levels can also indicate other health conditions.  ++++++++++++++++++++++++++ Recommend Adult Low Dose Aspirin or  coated  Aspirin 81 mg daily  To reduce risk of Colon Cancer 20 %,  Skin Cancer 26 % ,  Melanoma 46%  and  Pancreatic cancer 60% +++++++++++++++++++++++++ Vitamin D goal  is between 70-100.  Please make sure that you are taking your Vitamin D as directed.  It is very important as a natural anti-inflammatory  helping hair, skin, and nails, as well as reducing stroke and heart attack risk.  It helps your bones and helps with mood. It also decreases numerous cancer risks so please take it as directed.  Low Vit D is associated with a 200-300% higher risk for CANCER  and 200-300% higher risk for HEART   ATTACK  &  STROKE.   .....................................Marland Kitchen It is also associated with higher death rate at younger ages,  autoimmune diseases like Rheumatoid arthritis, Lupus, Multiple Sclerosis.    Also many other serious conditions, like depression, Alzheimer's Dementia, infertility, muscle aches, fatigue, fibromyalgia - just to name a few. ++++++++++++++++++++ Recommend the book "The END of DIETING" by Dr Excell Seltzer  & the book "The END of DIABETES " by Dr Excell Seltzer At [email or web address] - get  book & Audio CD's    Being diabetic has a  300% increased risk for heart attack, stroke, cancer, and alzheimer- type vascular dementia. It is very important that you work harder with diet by avoiding all foods that are white. Avoid white rice (brown & wild rice is OK), white potatoes (sweetpotatoes in moderation is OK), White bread or wheat bread or anything made out of white flour like bagels, donuts, rolls, buns, biscuits, cakes, pastries, cookies, pizza crust, and pasta (made from white flour & egg whites) - vegetarian pasta or spinach or wheat pasta is OK. Multigrain breads like Arnold's or Pepperidge Farm, or multigrain sandwich thins or flatbreads.  Diet, exercise and weight loss can reverse and cure diabetes in the early stages.  Diet, exercise and weight loss is very important in the control and prevention of complications of diabetes which affects every system in your body, ie. Brain - dementia/stroke, eyes - glaucoma/blindness, heart - heart attack/heart failure, kidneys - dialysis, stomach - gastric paralysis, intestines - malabsorption, nerves - severe painful neuritis, circulation - gangrene & loss of a leg(s), and finally cancer and Alzheimers.    I recommend avoid fried & greasy foods,  sweets/candy, white rice (brown or wild rice or Quinoa is OK), white potatoes (sweet potatoes are OK) - anything made from white flour - bagels, doughnuts, rolls, buns, biscuits,white and wheat breads, pizza crust and traditional pasta made of white flour & egg white(vegetarian pasta or spinach or wheat pasta is OK).  Multi-grain bread is OK - like multi-grain flat bread or sandwich thins. Avoid alcohol in excess. Exercise is also important.    Eat all the vegetables you want - avoid meat, especially red meat and dairy - especially cheese.  Cheese is the most concentrated form of trans-fats which is the worst thing to clog up our arteries. Veggie cheese is OK which can be found in the fresh produce section at  [employer] or [employer] or [employer]  +++++++++++++++++++++ DASH Eating Plan  DASH stands for "Dietary Approaches to Stop Hypertension."   The DASH eating plan is a healthy eating plan that has been shown to reduce high blood pressure (hypertension). Additional health benefits may include reducing the risk of type 2 diabetes mellitus, heart disease, and stroke. The DASH eating plan may also help with weight loss. WHAT DO I NEED TO KNOW ABOUT THE DASH EATING PLAN? For the DASH eating plan, you will follow these general guidelines:  Choose foods with a percent daily value for sodium of less than 5% (as listed on the food label).  Use salt-free seasonings or herbs instead of table salt or sea salt.  Check with your health care provider or pharmacist before using salt substitutes.  Eat lower-sodium products, often labeled as "lower sodium" or "no salt added."  Eat fresh foods.  Eat more vegetables, fruits, and low-fat dairy products.  Choose whole grains. Look for the word "whole" as the first word in the ingredient list.  Choose fish   Limit sweets, desserts, sugars, and sugary drinks.  Choose heart-healthy fats.  Eat veggie cheese   Eat more home-cooked food and less restaurant,  buffet, and fast food.  Limit fried foods.  Cook foods using methods other than frying.  Limit canned vegetables. If you do use them, rinse them well to decrease the sodium.  When eating at a restaurant, ask that your food be prepared with less salt, or no salt if possible.                      WHAT FOODS CAN I EAT? Read Dr Fara Olden Fuhrman's books on The End of Dieting & The End of Diabetes  Grains Whole grain or whole wheat bread. Brown rice. Whole grain or whole wheat pasta. Quinoa, bulgur, and whole grain cereals. Low-sodium cereals. Corn or whole wheat flour tortillas. Whole grain cornbread. Whole grain crackers. Low-sodium crackers.  Vegetables Fresh or frozen vegetables (raw, steamed,  roasted, or grilled). Low-sodium or reduced-sodium tomato and vegetable juices. Low-sodium or reduced-sodium tomato sauce and paste. Low-sodium or reduced-sodium canned vegetables.   Fruits All fresh, canned (in natural juice), or frozen fruits.  Protein Products  All fish and seafood.  Dried beans, peas, or lentils. Unsalted nuts and seeds. Unsalted canned beans.  Dairy Low-fat dairy products, such as skim or 1% milk, 2% or reduced-fat cheeses, low-fat ricotta or cottage cheese, or plain low-fat yogurt. Low-sodium or reduced-sodium cheeses.  Fats and Oils Tub margarines without trans fats. Light or reduced-fat mayonnaise and salad dressings (reduced sodium). Avocado. Safflower, olive, or canola oils. Natural peanut or almond butter.  Other Unsalted popcorn and pretzels. The items listed above may not be a complete list of recommended foods or beverages. Contact your dietitian for more options.  +++++++++++++++  WHAT FOODS ARE NOT RECOMMENDED? Grains/ White flour or wheat flour White bread. White pasta. White rice. Refined cornbread. Bagels and croissants. Crackers that contain trans fat.  Vegetables  Creamed or fried vegetables. Vegetables in a . Regular canned vegetables. Regular canned tomato sauce and paste. Regular tomato and vegetable juices.  Fruits Dried fruits. Canned fruit in light or heavy syrup. Fruit juice.  Meat and Other Protein Products Meat in general - RED meat & White meat.  Fatty cuts of meat. Ribs, chicken wings, all processed meats as bacon, sausage, bologna, salami, fatback, hot dogs, bratwurst and packaged luncheon meats.  Dairy Whole or 2% milk, cream, half-and-half, and cream cheese. Whole-fat or sweetened yogurt. Full-fat cheeses or blue cheese. Non-dairy creamers and whipped toppings. Processed cheese, cheese spreads, or cheese curds.  Condiments Onion and garlic salt, seasoned salt, table salt, and sea salt. Canned and packaged gravies.  Worcestershire sauce. Tartar sauce. Barbecue sauce. Teriyaki sauce. Soy sauce, including reduced sodium. Steak sauce. Fish sauce. Oyster sauce. Cocktail sauce. Horseradish. Ketchup and mustard. Meat flavorings and tenderizers. Bouillon cubes. Hot sauce. Tabasco sauce. Marinades. Taco seasonings. Relishes.  Fats and Oils Butter, stick margarine, lard, shortening and bacon fat. Coconut, palm kernel, or palm oils. Regular salad dressings.  Pickles and olives. Salted popcorn and pretzels.  The items listed above may not be a complete list of foods and beverages to avoid.

## 2016-01-14 ENCOUNTER — Other Ambulatory Visit: Payer: Self-pay | Admitting: Internal Medicine

## 2016-01-14 DIAGNOSIS — R972 Elevated prostate specific antigen [PSA]: Secondary | ICD-10-CM

## 2016-01-14 LAB — BASIC METABOLIC PANEL WITH GFR
BUN: 10 mg/dL (ref 7–25)
CALCIUM: 9 mg/dL (ref 8.6–10.3)
CO2: 31 mmol/L (ref 20–31)
CREATININE: 0.65 mg/dL — AB (ref 0.70–1.33)
Chloride: 91 mmol/L — ABNORMAL LOW (ref 98–110)
GFR, Est African American: >89 mL/min (ref >=60)
GFR, Est Non African American: >89 mL/min (ref >=60)
GLUCOSE: 98 mg/dL (ref 65–99)
Potassium: 3.8 mmol/L (ref 3.5–5.3)
SODIUM: 133 mmol/L — AB (ref 135–146)

## 2016-01-14 LAB — PSA, TOTAL AND FREE
PSA, % Free: 5 % — ABNORMAL LOW (ref >25)
PSA, FREE: 0.3 ng/mL
PSA, Total: 6.4 ng/mL — ABNORMAL HIGH (ref <=4.0)

## 2016-01-21 ENCOUNTER — Ambulatory Visit: Payer: Self-pay | Admitting: Internal Medicine

## 2016-01-26 ENCOUNTER — Ambulatory Visit: Payer: Self-pay | Admitting: Internal Medicine

## 2016-01-26 ENCOUNTER — Encounter: Payer: Self-pay | Admitting: Internal Medicine

## 2016-01-26 VITALS — BP 132/78 | HR 76 | Temp 97.3°F | Resp 16 | Ht 66.5 in | Wt 228.4 lb

## 2016-01-26 DIAGNOSIS — J0410 Acute tracheitis without obstruction: Secondary | ICD-10-CM

## 2016-01-26 DIAGNOSIS — J209 Acute bronchitis, unspecified: Secondary | ICD-10-CM

## 2016-01-26 DIAGNOSIS — J0141 Acute recurrent pansinusitis: Secondary | ICD-10-CM

## 2016-01-26 MED ORDER — LEVOFLOXACIN 500 MG PO TABS: ORAL_TABLET | ORAL | 0 refills | Status: DC

## 2016-01-26 MED ORDER — PREDNISONE 20 MG PO TABS: ORAL_TABLET | ORAL | 0 refills | Status: DC

## 2016-01-26 NOTE — Patient Instructions (Signed)
  Recc Mucus Relief     1,200   Mg SR       bid

## 2016-01-26 NOTE — Progress Notes (Signed)
[facility]   Unk [name], M.D.    Uvaldo Bristle. Silverio Lay, P.A.-C      Starlyn Skeans, P.A.-C  [facility]                [address] [place], [place] [id number] Telephone [phone] Telefax [phone]  Subjective:    Patient ID: Robert [name], male    DOB: [birth year], 61 y.o.   MRN: [id number]  HPI  Patient returns today 2 weeks s/p tx of a sinusitis and tracheobronchial syndrome with a Z-pak and prednisone pulse/taper and is completing his Z-pak refill today. He c/o persistent cough and congestion and sinus drainage and left frontal and cheek pain. denies fever, chills, sweats, rash.  Medication Sig  . aspirin 81 MG  Take  daily.  . benazepril-hctz 20-25 MG  TAKE ONE TAB DAILY  . bisoprolol-hctz 10-6.25 MG  Take 1 tab daily.  Marland Kitchen VITAMIN D 2000 UNITS  Take 2,000 Units  3  times daily.  . Magnesium 250 MG  Take by mouth daily.   Allergies  Allergen Reactions  . Codeine     REACTION: Itching   Past Medical History:  Diagnosis Date  . Elevated hemoglobin A1c   . Hyperlipidemia   . Hypertension   . Personal history of colonic adenomas 12/28/2007  . Vitamin D deficiency    Review of Systems  10 point systems review negative except as above.     Objective:   Physical Exam  BP 132/78   P 76   T 97.3 F    R 16   Ht 5' 6.5"    Wt 228 lb 6.4 oz    BMI 36.31    O2 sat 85-88%   Skin clear w/o rash, cyanosis or icterus. No stridor. Hoarse speech and brassy dry cough.   HEENT - Eac's patent. TM's Nl. EOM's full. PERRLA. NasoOroPharynx clear. Neck - supple. Nl Thyroid. Carotids 2+ & No bruits, nodes, JVD Chest - BS decreased from chest wall thickness with scattered coarse dry ins rales and coarse dry exp rhonchi & wheezes.  Cor - Nl HS. RRR w/o sig MGR. PP 1(+). No edema. MS- FROM. Gait Nl. Neuro - Nl w/o focal abnormalities.    Assessment & Plan:   1. Acute recurrent  pansinusitis   2. Acute bronchitis, unspecified organism   3. Tracheitis  - levofloxacin (LEVAQUIN) 500 MG tablet; Take 1 tablet daily with food for infection  Dispense: 14 tablet; Refill: 0  - predniSONE (DELTASONE) 20 MG tablet; 1 tab 3 x day for 3 days, then 1 tab 2 x day for 3 days, then 1 tab 1 x day for 5 days  Dispense: 20 tablet; Refill: 0  - discussed meds and SE' (+) tender Left frontal and Maxillary areas.  Given [facility] note 1/12-17/2018. Recc 10 day f/u.

## 2016-02-05 ENCOUNTER — Ambulatory Visit: Payer: Self-pay | Admitting: Internal Medicine

## 2016-02-05 VITALS — BP 116/68 | HR 60 | Temp 97.4°F | Resp 16 | Ht 66.5 in | Wt 231.6 lb

## 2016-02-05 DIAGNOSIS — J0410 Acute tracheitis without obstruction: Secondary | ICD-10-CM

## 2016-02-05 DIAGNOSIS — J0141 Acute recurrent pansinusitis: Secondary | ICD-10-CM

## 2016-02-05 DIAGNOSIS — R972 Elevated prostate specific antigen [PSA]: Secondary | ICD-10-CM

## 2016-02-05 NOTE — Progress Notes (Signed)
     Patient returned for 10 day f/u to recheck treatment of sinusitis and bronchitis and reports feeling much better with resolution of head and chest congestion and wheezing.     Patient's recent labs did show a continuing gradual rise of PSA and with 60free dropping to 5% and he was referred back to Dr Gaynelle Arabian for follow-up.

## 2016-02-06 ENCOUNTER — Telehealth: Payer: Self-pay | Admitting: *Deleted

## 2016-02-06 NOTE — Telephone Encounter (Signed)
Patient has an appointment with Dr Gaynelle Arabian on 02/18/2016, per [facility].

## 2016-02-18 DIAGNOSIS — N320 Bladder-neck obstruction: Secondary | ICD-10-CM

## 2016-02-18 HISTORY — DX: Bladder-neck obstruction: N32.0

## 2016-02-29 ENCOUNTER — Other Ambulatory Visit: Payer: Self-pay | Admitting: Physician Assistant

## 2016-03-16 DIAGNOSIS — R972 Elevated prostate specific antigen [PSA]: Secondary | ICD-10-CM

## 2016-03-16 HISTORY — PX: PROSTATE BIOPSY: SHX241

## 2016-03-16 HISTORY — DX: Elevated prostate specific antigen (PSA): R97.20

## 2016-03-25 ENCOUNTER — Other Ambulatory Visit (HOSPITAL_COMMUNITY): Payer: Self-pay | Admitting: Urology

## 2016-03-25 DIAGNOSIS — C61 Malignant neoplasm of prostate: Secondary | ICD-10-CM

## 2016-03-30 ENCOUNTER — Ambulatory Visit (HOSPITAL_COMMUNITY)
Admission: RE | Admit: 2016-03-30 | Discharge: 2016-03-30 | Disposition: A | Discharge status: Home / Self Care | Payer: Self-pay | Source: Ambulatory Visit | Attending: Urology | Admitting: Urology

## 2016-03-30 ENCOUNTER — Encounter (HOSPITAL_COMMUNITY): Payer: Self-pay

## 2016-03-30 ENCOUNTER — Other Ambulatory Visit (HOSPITAL_COMMUNITY): Payer: Self-pay | Admitting: Urology

## 2016-03-30 DIAGNOSIS — C61 Malignant neoplasm of prostate: Secondary | ICD-10-CM | POA: Insufficient documentation

## 2016-03-30 DIAGNOSIS — R599 Enlarged lymph nodes, unspecified: Secondary | ICD-10-CM | POA: Insufficient documentation

## 2016-03-30 MED ORDER — TECHNETIUM TC 99M MEDRONATE IV KIT
20.7000 | PACK | Freq: Once | INTRAVENOUS | Status: AC | PRN
  Administered 2016-03-30: 20.7 via INTRAVENOUS

## 2016-03-30 MED ORDER — IOPAMIDOL (ISOVUE-300) INJECTION 61%
100.0000 mL | Freq: Once | INTRAVENOUS | Status: AC | PRN
  Administered 2016-03-30: 100 mL via INTRAVENOUS

## 2016-03-30 MED ORDER — IOPAMIDOL (ISOVUE-300) INJECTION 61%
INTRAVENOUS | Status: AC
Start: 2016-03-30 — End: 2016-03-31
  Filled 2016-03-30: qty 100

## 2016-03-30 MED ORDER — IOPAMIDOL (ISOVUE-300) INJECTION 61%
INTRAVENOUS | Status: AC
  Filled 2016-03-30: qty 30

## 2016-04-21 ENCOUNTER — Encounter: Payer: Self-pay | Admitting: Radiation Oncology

## 2016-05-10 ENCOUNTER — Telehealth: Payer: Self-pay | Admitting: Medical Oncology

## 2016-05-10 NOTE — Progress Notes (Signed)
Left message requesting a return call to discuss referral to the [facility]. 

## 2016-05-17 ENCOUNTER — Encounter: Payer: Self-pay | Admitting: Medical Oncology

## 2016-05-20 ENCOUNTER — Telehealth: Payer: Self-pay | Admitting: Medical Oncology

## 2016-05-20 ENCOUNTER — Encounter: Payer: Self-pay | Admitting: Genetic Counselor

## 2016-05-20 DIAGNOSIS — C61 Malignant neoplasm of prostate: Secondary | ICD-10-CM | POA: Insufficient documentation

## 2016-05-20 DIAGNOSIS — Z8546 Personal history of malignant neoplasm of prostate: Secondary | ICD-10-CM | POA: Insufficient documentation

## 2016-05-20 NOTE — Progress Notes (Signed)
Left a message with details of appointment for the [facility] [place]. I reminded him to bring his completed medical forms and to return my call to confirm if possible.

## 2016-05-20 NOTE — Telephone Encounter (Signed)
[name] called to confirm his appointment for [facility] 05/21/16.

## 2016-05-20 NOTE — Progress Notes (Signed)
GU Location of Tumor / Histology: Prostate Cancer  If Prostate Cancer, Gleason Score is (4 + 4) and PSA is (6.17)  Robert [name] was referred by Dr. Unk [name] to Dr. Gaynelle Arabian.  He was first noticed symptoms back in 02/11/2014 for increasing lower urinary tract infection symptoms, frequency, urgency, weak stream, intermittency, straining, nocturia and sense of incomplete emptying.  Patient also had a slowly rising PSA.   PSA 12/05/2012 @ 3.33, PSA  06/17/15 @ 5.24, PSA 01/13/2016 @ 6.4  Biopsies of Prostate revealed:  03/16/2016    Past/Anticipated interventions by urology, if any:  03/16/2016 Surgical Pathology, Gross and Microscopic Examination for Prostate Needle  Past/Anticipated interventions by medical oncology, if any: None  Weight changes, if any: None  Bowel/Bladder complaints, if any: Nocturia X 2, Weak flow, Intermittent urinary flow, incomplete bladder emptying, Urinary Urgency, Has to push/strain to begin urination.    Nausea/Vomiting, if any: None  Pain issues, if any: None  SAFETY ISSUES:  Prior radiation? No  Pacemaker/ICD? No  Possible current pregnancy? No  Is the patient on methotrexate? 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

## 2016-05-20 NOTE — Progress Notes (Signed)
I called pt to introduce myself as the Prostate Nurse Navigator and the Coordinator of the Prostate [place].  1. I confirmed with the patient he is aware of his referral to the clinic 05/21/16.  2. I discussed the format of the clinic and the physicians he will be seeing that day.  3. I discussed where the clinic is located and how to contact me.  4. I confirmed his address and informed him I would be mailing a packet of information and forms to be completed. I asked him to bring them with him the day of his appointment.   He voiced understanding of the above. I asked him to call me if he has any questions or concerns regarding his appointments or the forms he needs to complete.

## 2016-05-20 NOTE — Progress Notes (Signed)
Requested pathology slides from [facility]

## 2016-05-21 ENCOUNTER — Encounter: Payer: Self-pay | Admitting: Medical Oncology

## 2016-05-21 ENCOUNTER — Encounter: Payer: Self-pay | Admitting: General Practice

## 2016-05-21 ENCOUNTER — Encounter: Payer: Self-pay | Admitting: Radiation Oncology

## 2016-05-21 ENCOUNTER — Ambulatory Visit (HOSPITAL_BASED_OUTPATIENT_CLINIC_OR_DEPARTMENT_OTHER): Payer: Self-pay | Admitting: Oncology

## 2016-05-21 ENCOUNTER — Ambulatory Visit
Admission: RE | Admit: 2016-05-21 | Discharge: 2016-05-21 | Disposition: A | Discharge status: Home / Self Care | Payer: BLUE CROSS/BLUE SHIELD | Source: Ambulatory Visit | Attending: Radiation Oncology | Admitting: Radiation Oncology

## 2016-05-21 DIAGNOSIS — C61 Malignant neoplasm of prostate: Secondary | ICD-10-CM | POA: Diagnosis not present

## 2016-05-21 DIAGNOSIS — E559 Vitamin D deficiency, unspecified: Secondary | ICD-10-CM | POA: Insufficient documentation

## 2016-05-21 DIAGNOSIS — Z888 Allergy status to other drugs, medicaments and biological substances status: Secondary | ICD-10-CM | POA: Insufficient documentation

## 2016-05-21 DIAGNOSIS — Z9889 Other specified postprocedural states: Secondary | ICD-10-CM | POA: Insufficient documentation

## 2016-05-21 DIAGNOSIS — E785 Hyperlipidemia, unspecified: Secondary | ICD-10-CM | POA: Diagnosis not present

## 2016-05-21 DIAGNOSIS — N320 Bladder-neck obstruction: Secondary | ICD-10-CM | POA: Diagnosis not present

## 2016-05-21 DIAGNOSIS — I10 Essential (primary) hypertension: Secondary | ICD-10-CM | POA: Diagnosis not present

## 2016-05-21 DIAGNOSIS — E7800 Pure hypercholesterolemia, unspecified: Secondary | ICD-10-CM | POA: Diagnosis not present

## 2016-05-21 DIAGNOSIS — Z79899 Other long term (current) drug therapy: Secondary | ICD-10-CM | POA: Diagnosis not present

## 2016-05-21 DIAGNOSIS — Z801 Family history of malignant neoplasm of trachea, bronchus and lung: Secondary | ICD-10-CM | POA: Insufficient documentation

## 2016-05-21 DIAGNOSIS — Z7982 Long term (current) use of aspirin: Secondary | ICD-10-CM | POA: Diagnosis not present

## 2016-05-21 DIAGNOSIS — Z885 Allergy status to narcotic agent status: Secondary | ICD-10-CM | POA: Diagnosis not present

## 2016-05-21 DIAGNOSIS — Z8249 Family history of ischemic heart disease and other diseases of the circulatory system: Secondary | ICD-10-CM | POA: Diagnosis not present

## 2016-05-21 DIAGNOSIS — F17210 Nicotine dependence, cigarettes, uncomplicated: Secondary | ICD-10-CM | POA: Diagnosis not present

## 2016-05-21 HISTORY — DX: Malignant neoplasm of prostate: C61

## 2016-05-21 HISTORY — DX: Bladder-neck obstruction: N32.0

## 2016-05-21 HISTORY — DX: Elevated prostate specific antigen (PSA): R97.20

## 2016-05-21 NOTE — Consult Note (Signed)
Robert [name]     05/21/2016     --------------------------------------------------------------------------------     Robert [name]   MRN: [id number]  PRIMARY CARE:  Unk [name], MD   DOB: Aug 14, 1955, 61 year old 61 year old Male  REFERRING:  Unk [name], MD   SSN: [id number]  PROVIDER:  Carolan Clines, M.D.     TREATING:  Raynelle Bring, M.D.     LOCATION:  [facility] [phone]     --------------------------------------------------------------------------------     CC/HPI: CC: Prostate Cancer     Physician requesting consult: Dr. Carolan Clines   Location of consult: [name]   PCP: Dr. Unk [name]     Robert [name] is a 61 year old gentleman who was noted to have a persistently elevated PSA of 6.4 prompting a TRUS biopsy of the prostate on 03/16/16. 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     Family history: None.     Imaging studies:   Bone scan (03/30/16) - degenerative changes, no evidence of metastatic disease   CT abdomen and pelvis (03/30/16) - 12 mm non-specific para-aortic lymph node without other evidence of lymphadenopathy, metastatic disease unlikely     PMH: He has a history of hypertension and hypercholesterolemia.   PSH: No abdominal surgeries.     TNM stage: cT1c N0 M0   PSA: 6.4   Gleason score: 4+4=8   Biopsy (03/16/16): 6/12 cores positive   Left: L lateral apex (20%, 3+4=7), L apex (30%, 4+3=7), L lateral mid (20%, 4+4=8), L mid (30%, 4+3=7), L lateral base (20%, 4+4=8), L base (70%, 4+3=7)   Right: Benign   Prostate volume: 25.2 cc     Nomogram   OC disease: 23%   EPE: 74%   SVI: 20%   LNI: 20%   PFS (5 year, 10 year): 48%,33%     Urinary function: IPSS is 16. He takes tamsulosin which has improved his symptoms. They are now tolerable but were not prior to medical therapy.   Erectile function: SHIM score is 8.  He estimates he can get an adequate erection for intercourse 3-4 times out of 10. He does not take oral medical therapy.        ALLERGIES: Codeine Derivatives       MEDICATIONS: Tamsulosin Hcl 0.4 mg capsule, ext release 24 hr 1 capsule PO Daily   Aspirin Ec 81 mg tablet, delayed release Oral   Benazepril-Hydrochlorothiazide 20 mg-25 mg tablet Oral   Bisoprolol-Hydrochlorothiazide 10 mg-6.25 mg tablet   Magnesium 250 mg tablet   Vitamin D3 2,000 unit tablet Oral        GU PSH: Prostate Needle Biopsy - 03/16/2016       NON-GU PSH: Surgical Pathology, Gross And Microscopic Examination For Prostate Needle - 03/16/2016       GU PMH: BPH w/LUTS - 04/13/2016, - 03/16/2016, - 02/18/2016, - 02/18/2016  Elevated PSA - 04/13/2016, - 03/16/2016, - 02/18/2016, Elevated prostate specific antigen (PSA), - 2014  Prostate Cancer - 04/13/2016  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 Smokes 1 pack per day.     Tobacco Use Assessment Completed: Used Tobacco in last 30 days?  Does drink.   Drinks 4+ caffeinated drinks per day.  Patient's occupation Corporate investment banker.       REVIEW OF SYSTEMS:     GU Review Male:   Patient denies frequent urination, hard to postpone urination, burning/ pain with urination, get up at night to urinate, leakage of urine, stream starts and stops, trouble starting your streams, and have to strain to urinate .   Gastrointestinal (Lower):   Patient denies diarrhea and  constipation.   Gastrointestinal (Upper):   Patient denies nausea and vomiting.   Constitutional:   Patient denies fever, night sweats, weight loss, and fatigue.   Skin:   Patient denies skin rash/ lesion and itching.   Eyes:   Patient denies blurred vision and double vision.   Ears/ Nose/ Throat:   Patient denies sore throat and sinus problems.   Hematologic/Lymphatic:   Patient denies swollen glands and easy bruising.   Cardiovascular:   Patient denies leg swelling and chest pains.   Respiratory:   Patient denies cough and shortness of breath.   Endocrine:   Patient denies excessive thirst.   Musculoskeletal:   Patient denies back pain and joint pain.   Neurological:   Patient denies headaches and dizziness.   Psychologic:   Patient denies depression and anxiety.     VITAL SIGNS: None     GU PHYSICAL EXAMINATION:     Anus and Perineum: No hemorrhoids. No anal stenosis. No rectal fissure, no anal fissure. No edema, no dimple, no perineal tenderness, no anal tenderness.   Prostate: Prostate about 50 grams. Left lobe normal consistency, right lobe normal consistency. Symmetrical lobes. No prostate nodule. Left lobe no tenderness, right lobe no tenderness.      MULTI-SYSTEM PHYSICAL EXAMINATION:     Constitutional: Well-nourished. No physical deformities. Normally developed. Good grooming.   Neck: Neck symmetrical, not swollen. Normal tracheal position.   Respiratory: No labored breathing, no use of accessory muscles. He has mild right sided inspiratory wheezes. Otherwise clear.   Cardiovascular: Normal temperature, normal extremity pulses, no swelling, no varicosities. RRR.   Lymphatic: No enlargement of neck, axillae, groin.   Skin: No paleness, no jaundice, no cyanosis. No lesion, no ulcer, no rash.   Neurologic / Psychiatric: Oriented to time, oriented to place, oriented to person. No depression, no anxiety, no agitation.   Gastrointestinal: No mass, no tenderness, no rigidity. Moderate  obesity.   Eyes: Normal conjunctivae. Normal eyelids.   Ears, Nose, Mouth, and Throat: Left ear no scars, no lesions, no masses. Right ear no scars, no lesions, no masses. Nose no scars, no lesions, no masses. Normal hearing. Normal lips.   Musculoskeletal: Normal gait and station of head and neck.        PAST DATA REVIEWED:   Source Of History:  Patient   Lab Test Review:   PSA   Records Review:   Pathology Reports, Previous Patient Records   X-Ray Review: C.T. Abdomen/Pelvis: Reviewed Films.   Bone Scan: Reviewed Films.        PROCEDURES: None     ASSESSMENT:       ICD-10 Details   1 GU:   Prostate Cancer - C61      PLAN:  Document  Letter(s):  Created for Patient: Clinical Summary            Notes:   1. Prostate cancer: I had a detailed discussion with Robert [name] and his wife today. He has already seen Dr. Tammi Klippel and Dr. Alen Blew. We discussed the high risk nature of his prostate cancer in the context of his life expectancy and I recommended therapy of curative intent.     The patient was counseled about the natural history of prostate cancer and the standard treatment options that are available for prostate cancer. It was explained to him how his age and life expectancy, clinical stage, Gleason score, and PSA affect his prognosis, the decision to proceed with additional staging studies, as well as how that information influences recommended treatment strategies. We discussed the roles for active surveillance, radiation therapy, surgical therapy, androgen deprivation, as well as ablative therapy options for the treatment of prostate cancer as appropriate to his individual cancer situation. We discussed the risks and benefits of these options with regard to their impact on cancer control and also in terms of potential adverse events, complications, and impact on quality of life particularly related to urinary and sexual function. The patient was encouraged to ask questions  throughout the discussion today and all questions were answered to his stated satisfaction. In addition, the patient was provided with and/or directed to appropriate resources and literature for further education about prostate cancer and treatment options.     We discussed surgical therapy for prostate cancer including the different available surgical approaches. We discussed, in detail, the risks and expectations of surgery with regard to cancer control, urinary control, and erectile function as well as the expected postoperative recovery process. Additional risks of surgery including but not limited to bleeding, infection, hernia formation, nerve damage, lymphocele formation, bowel/rectal injury potentially necessitating colostomy, damage to the urinary tract resulting in urine leakage, urethral stricture, and the cardiopulmonary risks such as myocardial infarction, stroke, death, venothromboembolism, etc. were explained. The risk of open surgical conversion for robotic/laparoscopic prostatectomy was also discussed.     Ultimately, he has elected to proceed with surgical therapy. He will be scheduled to undergo a UNS (right) RAL radical prostatectomy and BPLND.     cc: Dr. Carolan Clines   Dr. Tyler Pita   Dr. Zola Button   Dr. Unk [name]                  E & M CODE: I spent at least 60 minutes face to face with the patient, more than 50% of that time was spent on counseling and/or coordinating care.

## 2016-05-21 NOTE — Progress Notes (Signed)
Reason for Referral: Prostate cancer.   HPI: 61 year old gentleman with history of hypertension and hyperlipidemia but otherwise in reasonably good health. He was found to have an elevated PSA of 6.4 in January 2018. Prior to that his PSA has been rising from 5.24 and 3.33 in November 2014. He was evaluated by Dr. Gaynelle Arabian and underwent a biopsy in March 2018. The biopsy showed a Gleason score 4+3 = 7 in 4 cores and 4+4 = 8 in 2 other cores. Imaging studies including a bone scan did not show any evidence of metastatic disease. He also had a CT scan of the abdomen which did not show clear-cut evidence of metastasis. He did have a solitary periaortic lymph node that appears to be a reactive. Clinically, he is asymptomatic at this time. He does report some nocturia and frequency but reasonably happy with his urine flow. He denies any arthralgias or myalgias. He continues to be active and attends activities of daily living.  He does not report any headaches, blurry vision, syncope or seizures. He does not report any fevers, chills or sweats. He does not report any cough, wheezing or hemoptysis. He does not report any chest pain, palpitation, orthopnea or leg edema. He does not report any nausea or vomiting or abdominal pain. He does not report any skeletal complaints. Remaining review of systems unremarkable.   Past Medical History:  Diagnosis Date  . 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 Elevated hemoglobin A1c   . Elevated PSA 03/16/2016  . Hyperlipidemia   . Hypertension   . Personal history of colonic adenomas 12/28/2007  . Prostate cancer ([place]) 03/16/2016  . Vitamin D deficiency   :  Past Surgical History:  Procedure Laterality Date  . PROSTATE BIOPSY  03/16/2016   Surgical Pathology Gross and Microscopic Examination for Prostate Needle  :   Current Outpatient Prescriptions:  .  aspirin 81 MG tablet, Take 81 mg by mouth daily., Disp: , Rfl:  .  benazepril-hydrochlorthiazide (LOTENSIN  HCT) 20-25 MG tablet, TAKE ONE TABLET BY MOUTH DAILY FOR BLOOD PRESSURE, Disp: 90 tablet, Rfl: 0 .  bisoprolol-hydrochlorothiazide (ZIAC) 10-6.25 MG tablet, TAKE ONE TABLET BY MOUTH DAILY, Disp: 90 tablet, Rfl: 0 .  Cholecalciferol (VITAMIN D) 2000 UNITS tablet, Take 2,000 Units by mouth 3 (three) times daily., Disp: , Rfl:  .  levofloxacin (LEVAQUIN) 500 MG tablet, Take 1 tablet daily with food for infection, Disp: 14 tablet, Rfl: 0 .  Magnesium 250 MG TABS, Take by mouth daily., Disp: , Rfl:  .  predniSONE (DELTASONE) 20 MG tablet, 1 tab 3 x day for 3 days, then 1 tab 2 x day for 3 days, then 1 tab 1 x day for 5 days, Disp: 20 tablet, Rfl: 0:  Allergies  Allergen Reactions  . Codeine     REACTION: Itching  . Isovue [Iopamidol] Hives    Pt. Developed 1 hive after injection; Dr. Kris Hartmann spoke with patient;recommends 13 hr prep in the future  :  Family History  Problem Relation Age of Onset  . Cancer Mother        lung  :  Social History   Social History  . Marital status: Married    Spouse name: N/A  . Number of children: N/A  . Years of education: N/A   Occupational History  . Not on file.   Social History Main Topics  . Smoking status: Current Every Day Smoker    Packs/day: 1.00  . Smokeless tobacco: Not on file  .  Alcohol use Yes     Comment: rarely  . Drug use: Unknown  . Sexual activity: Yes   Other Topics Concern  . Not on file   Social History Narrative  . No narrative on file  :  Pertinent items are noted in HPI.  Exam: There were no vitals taken for this visit. General appearance: alert and cooperative Throat: lips, mucosa, and tongue normal; teeth and gums normal Neck: no adenopathy Back: negative Resp: clear to auscultation bilaterally Chest wall: no tenderness Cardio: regular rate and rhythm, S1, S2 normal, no murmur, click, rub or gallop GI: soft, non-tender; bowel sounds normal; no masses,  no organomegaly Extremities: extremities normal,  atraumatic, no cyanosis or edema Pulses: 2+ and symmetric  CBC    Component Value Date/Time   WBC 8.7 06/17/2015 1142   RBC 4.72 06/17/2015 1142   HGB 15.6 06/17/2015 1142   HCT 45.6 06/17/2015 1142   PLT 172 06/17/2015 1142   MCV 96.6 06/17/2015 1142   MCH 33.1 (H) 06/17/2015 1142   MCHC 34.2 06/17/2015 1142   RDW 13.0 06/17/2015 1142   LYMPHSABS 1,740 06/17/2015 1142   MONOABS 783 06/17/2015 1142   EOSABS 87 06/17/2015 1142   BASOSABS 0 06/17/2015 1142     Chemistry      Component Value Date/Time   NA 133 (L) 01/13/2016 1704   K 3.8 01/13/2016 1704   CL 91 (L) 01/13/2016 1704   CO2 31 01/13/2016 1704   BUN 10 01/13/2016 1704   CREATININE 0.65 (L) 01/13/2016 1704      Component Value Date/Time   CALCIUM 9.0 01/13/2016 1704   ALKPHOS 59 06/17/2015 1142   AST 16 06/17/2015 1142   ALT 15 06/17/2015 1142   BILITOT 0.8 06/17/2015 1142       Assessment and Plan:   61 year old gentleman with prostate cancer diagnosed in March 2018. His PSA 6.17 and a Gleason score 4+4 = 8 that is evident in 2 cores. He has a Gleason score of 4+3 = 7 in 4 other cores.  His case was discussed today and the prostate cancer multidisciplinary clinic. Imaging studies were reviewed with radiology including a CT scan of the abdomen and pelvis which did show periaortic lymph node appears to be reactive and not suspicious for malignancy. His pathology was also discussed with the reviewing pathologist.  His options were reviewed today which include primary surgical therapy versus radiation therapy and androgen deprivation. The rationale for using those approaches were discussed as well as the likelihood of success rate. He is favoring primary surgical therapy at this time I will discuss this option with Dr. Alinda Money today. If he elects to proceed with radiation therapy, he will require any deprivation therapy for at least 18 months. Complications associated with this therapy were reviewed today as well. No  systemic therapy options is recommended beyond that.  He will decide treatment modality in the near future. He understands that definitive therapy is important at this time given the possibility of metastasis and at that time his disease becomes incurable.

## 2016-05-21 NOTE — Progress Notes (Signed)
Radiation Oncology         [phone] ________________________________  Initial Outpatient Consultation  Name: Robert GRABEL MRN: [id number]  Date: 05/21/2016  DOB: Apr 11, 1955  CC:Unk [name], MD  Raynelle Bring, MD   REFERRING PHYSICIAN: Raynelle Bring, MD  DIAGNOSIS: 61 y.o. gentleman with Stage T1c adenocarcinoma of the prostate with Gleason Score of 4+4, and PSA of 6.4.    ICD-9-CM ICD-10-CM   1. Malignant neoplasm of prostate ([place]) 185 C61     HISTORY OF PRESENT ILLNESS: Robert [name] is a 61 y.o. male with a diagnosis of prostate cancer. He was noted to have an elevated PSA of 6.4 in January 2018 by his primary care physician, Dr. Melford [name].  Accordingly, he was referred for evaluation in urology by Dr. Gaynelle [name] on 02/18/16,  digital rectal examination was performed at that time revealing a prostate with a bilateral normal consistency without nodules.  The patient proceeded to transrectal ultrasound with 12 biopsies of the prostate on 03/16/16.  The prostate volume measured 25.23 cc.  Out of 12 core biopsies,6 were positive, all on the left.  The maximum Gleason score was 4+4, and this was seen in left base lateral and left mid lateral gland.  Additionally, there was 4+3 in the left base, left mid gland and left apex and 3+4 in the left apex lateral.  CT A/P was performed on 03/30/16 revealed a solitary 12 mm retroperitoneal node, favored to be benign. Bone Scan on 03/30/16 was without evidence of skeletal metastasis  The patient reviewed the biopsy results with his urologist and he has kindly been referred today for discussion of potential radiation treatment options.  PSA 01/13/2016: 6.4 06/17/2015: 5.24 12/05/2012: 3.33 12/01/2011: 2.66 11/26/2010: 2.19   PREVIOUS RADIATION THERAPY: No  PAST MEDICAL HISTORY:  Past Medical History:  Diagnosis Date  . 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 Elevated hemoglobin A1c   . Elevated PSA 03/16/2016  . Hyperlipidemia   .  Hypertension   . Personal history of colonic adenomas 12/28/2007  . Prostate cancer ([place]) 03/16/2016  . Vitamin D deficiency       PAST SURGICAL HISTORY: Past Surgical History:  Procedure Laterality Date  . PROSTATE BIOPSY  03/16/2016   Surgical Pathology Gross and Microscopic Examination for Prostate Needle    FAMILY HISTORY:  Family History  Problem Relation Age of Onset  . Cancer Mother        lung    SOCIAL HISTORY:  Social History   Social History  . Marital status: Married    Spouse name: N/A  . Number of children: N/A  . Years of education: N/A   Occupational History  . Not on file.   Social History Main Topics  . Smoking status: Current Every Day Smoker    Packs/day: 1.00  . Smokeless tobacco: Not on file  . Alcohol use Yes     Comment: rarely  . Drug use: Unknown  . Sexual activity: Yes   Other Topics Concern  . Not on file   Social History Narrative  . No narrative on file    ALLERGIES: Codeine and Isovue [iopamidol]  MEDICATIONS:  Current Outpatient Prescriptions  Medication Sig Dispense Refill  . aspirin 81 MG tablet Take 81 mg by mouth daily.    . benazepril-hydrochlorthiazide (LOTENSIN HCT) 20-25 MG tablet TAKE ONE TABLET BY MOUTH DAILY FOR BLOOD PRESSURE 90 tablet 0  . bisoprolol-hydrochlorothiazide (ZIAC) 10-6.25 MG tablet TAKE ONE TABLET BY MOUTH DAILY 90 tablet 0  .  Cholecalciferol (VITAMIN D) 2000 UNITS tablet Take 2,000 Units by mouth 3 (three) times daily.    Marland Kitchen levofloxacin (LEVAQUIN) 500 MG tablet Take 1 tablet daily with food for infection 14 tablet 0  . Magnesium 250 MG TABS Take by mouth daily.    . predniSONE (DELTASONE) 20 MG tablet 1 tab 3 x day for 3 days, then 1 tab 2 x day for 3 days, then 1 tab 1 x day for 5 days 20 tablet 0   No current facility-administered medications for this encounter.     REVIEW OF SYSTEMS:  On review of systems a complete 15 point review of systems is documented in the record.  The patient reports  that he is doing well overall. He denies any chest pain, shortness of breath, cough, fevers, chills, night sweats, unintended weight changes. He has a history of hypertension which is well controlled on medications.  He denies any bowel disturbances, and denies abdominal pain, nausea or vomiting. He denies any new musculoskeletal or joint aches or pains. His IPSS was 16, indicating moderate urinary symptoms. He is unable to complete sexual activity with most attempts. A complete review of systems is obtained and is otherwise negative.    PHYSICAL EXAM:  Wt Readings from Last 3 Encounters:  02/05/16 231 lb 9.6 oz (105.1 kg)  01/26/16 228 lb 6.4 oz (103.6 kg)  01/13/16 231 lb 6.4 oz (105 kg)   Temp Readings from Last 3 Encounters:  02/05/16 97.4 F (36.3 C)  01/26/16 97.3 F (36.3 C)  01/13/16 97.7 F (36.5 C)   BP Readings from Last 3 Encounters:  02/05/16 116/68  01/26/16 132/78  01/13/16 116/64   Pulse Readings from Last 3 Encounters:  02/05/16 60  01/26/16 76  01/13/16 64    In general this is a well appearing caucasian male in no acute distress. He is alert and oriented x4 and appropriate throughout the examination. HEENT reveals that the patient is normocephalic, atraumatic. EOMs are intact. PERRLA. Skin is intact without any evidence of gross lesions. Cardiovascular exam reveals a regular rate and rhythm, no clicks rubs or murmurs are auscultated. Chest is clear to auscultation bilaterally with decreased breath sounds bilateral lung bases. Lymphatic assessment is performed and does not reveal any adenopathy in the cervical, supraclavicular, axillary chains. Abdomen has active bowel sounds in all quadrants and is intact. The abdomen is soft, non tender, non distended. Lower extremities are negative for pretibial pitting edema, deep calf tenderness, cyanosis or clubbing.   KPS = 100  100 - Normal; no complaints; no evidence of disease. 90   - Able to carry on normal activity;  minor signs or symptoms of disease. 80   - Normal activity with effort; some signs or symptoms of disease. 25   - Cares for self; unable to carry on normal activity or to do active work. 60   - Requires occasional assistance, but is able to care for most of his personal needs. 50   - Requires considerable assistance and frequent medical care. 30   - Disabled; requires special care and assistance. 62   - Severely disabled; hospital admission is indicated although death not imminent. 32   - Very sick; hospital admission necessary; active supportive treatment necessary. 10   - Moribund; fatal processes progressing rapidly. 0     - Dead  Karnofsky DA, Abelmann WH, Craver LS and Burchenal [facility] [phone]) The use of the nitrogen mustards in the palliative treatment of carcinoma: with particular reference  to bronchogenic carcinoma Cancer 1 634-56  LABORATORY DATA:  Lab Results  Component Value Date   WBC 8.7 06/17/2015   HGB 15.6 06/17/2015   HCT 45.6 06/17/2015   MCV 96.6 06/17/2015   PLT 172 06/17/2015   Lab Results  Component Value Date   NA 133 (L) 01/13/2016   K 3.8 01/13/2016   CL 91 (L) 01/13/2016   CO2 31 01/13/2016   Lab Results  Component Value Date   ALT 15 06/17/2015   AST 16 06/17/2015   ALKPHOS 59 06/17/2015   BILITOT 0.8 06/17/2015     RADIOGRAPHY: No results found.    IMPRESSION/PLAN: 1. 61 y.o. gentleman with Stage T1c adenocarcinoma of the prostate with Gleason Score of 4+4, and PSA of 6.4. We discussed the patient's workup and outlined the nature of prostate cancer in this setting. The patient's T stage, Gleason's score, and PSA put him into the high risk group. Accordingly he is eligible for a variety of potential treatment options including external radiation or external radiation and brachytherapy boost. We discussed the role of ADT in the treatment of high risk prostate cancer, anticipating a course of 2 years ADT in addition to EBRT with or without a radioactive  seed boost.  We discussed the available radiation techniques, and focused on the details of logistics and delivery. The patient is a candidate for brachytherapy boost with a prostate volume of 25.23 gm. We discussed and outlined the risks, benefits, short and long-term effects associated with radiotherapy and compared and contrasted these with prostatectomy.   At the end of the conversation the patient is leaning towards prostatectomy. We discussed the fact that radiotherapy could be a reasonable salvage option and/or adjuvant therapy in the future if necessary based on surgical pathology and surveillance PSA after prostatectomy. We will share our discussion with Dr. Gaynelle [name] and the patient will meet with Dr. Alinda Money in [facility] today to further discuss the option of prostatectomy.  We spent 60 minutes face to face with the patient and more than 50% of that time was spent in counseling and/or coordination of care.   Nicholos Johns, PA-C    Tyler Pita, MD  [facility] Direct Dial: [phone]  Fax: [phone] .com  Skype  LinkedIn  This document serves as a record of services personally performed by Tyler Pita, MD. It was created on his behalf by Darcus Austin, a trained medical scribe. The creation of this record is based on the scribe's personal observations and the provider's statements to them. This document has been checked and approved by the attending provider.

## 2016-05-21 NOTE — Progress Notes (Signed)
                               Care Plan Summary  Name: Mr. Mandell Pangborn DOB: 12/31/1955   Your Medical Team:   Urologist -  Dr. Raynelle Bring, [facility]  Radiation Oncologist - Dr. Tyler Pita, [facility]   Medical Oncologist - Dr. Zola Button, [place]  Recommendations: 1) Robotic Prostatectomy 2) Seed Implant  3) Radiation  * These recommendations are based on information available as of today's consult.      Recommendations may change depending on the results of further tests or exams.  Next Steps: 1) Dr. Lynne Logan office will schedule surgery   When appointments need to be scheduled, you will be contacted by [facility] and/or [facility].  Questions?  Please do not hesitate to call Cira Rue, RN, BSN, OCN at [phone] any questions or concerns.  Shirlean Mylar is your Oncology Nurse Navigator and is available to assist you while you're receiving your medical care at [facility].  Patient was given a folder with team members business cards and a copy of "[facility] Prevention and Safety".

## 2016-05-24 NOTE — Progress Notes (Signed)
[facility] Psychosocial Distress Screening Spiritual Care  Met with Darwin and his wife in [facility] to introduce [place] team/resources, reviewing distress screen per protocol.  The patient scored a 0 on the Psychosocial Distress Thermometer which indicates minimal distress. Also assessed for distress and other psychosocial needs.   ONCBCN DISTRESS SCREENING 05/24/2016  Screening Type Initial Screening  Distress experienced in past week (1-10) 0  Referral to support programs Yes   Per pt, he approaches needs in a matter-of-fact way and tends not to stress about what is not in his control. His wife indicated a higher level of distress; I provided pastoral presence and normalization of feelings, describing [place] programming/resources that might help with processing and coping. She plans to explore these as needs/interests become more clear.  Follow up needed: No.  Per pt, no other needs at this time. Please page if immediate needs arise.  Thank you.   [place], [place], [facility] Pager [phone] Voicemail [phone]

## 2016-05-31 ENCOUNTER — Telehealth: Payer: Self-pay | Admitting: Medical Oncology

## 2016-05-31 NOTE — Progress Notes (Signed)
Left message as follow up to Prostate [facility] to inquire if he has surgery date. I asked for a return call.

## 2016-06-01 ENCOUNTER — Other Ambulatory Visit: Payer: Self-pay | Admitting: Physician Assistant

## 2016-06-03 ENCOUNTER — Other Ambulatory Visit: Payer: Self-pay | Admitting: Urology

## 2016-06-18 ENCOUNTER — Other Ambulatory Visit: Payer: Self-pay | Admitting: Internal Medicine

## 2016-06-18 DIAGNOSIS — I10 Essential (primary) hypertension: Secondary | ICD-10-CM

## 2016-06-22 NOTE — Progress Notes (Signed)
Graylon Gunning MD 01-13-16 epic

## 2016-06-22 NOTE — Patient Instructions (Signed)
Robert BOWSHER  06/22/2016   Your procedure is scheduled on: 06-28-16  Report to [facility] Main  Entrance Take [place]  elevators to 3rd floor to  [place] at [phone].    Call this number if you have problems the morning of surgery [phone]    Remember: ONLY 1 PERSON MAY GO WITH YOU TO SHORT STAY TO GET  READY MORNING OF YOUR SURGERY.  Do not eat food or drink liquids :After Midnight.     Take these medicines the morning of surgery with A SIP OF WATER: tamsulosin(flomax)                                 You may not have any metal on your body including hair pins and              piercings  Do not wear jewelry, make-up, lotions, powders or perfumes, deodorant              Men may shave face and neck.   Do not bring valuables to the hospital. [place].  Contacts, dentures or bridgework may not be worn into surgery.  Leave suitcase in the car. After surgery it may be brought to your room.               Please read over the following fact sheets you were given: _____________________________________________________________________             [facility] - Preparing for Surgery Before surgery, you can play an important role.  Because skin is not sterile, your skin needs to be as free of germs as possible.  You can reduce the number of germs on your skin by washing with CHG (chlorahexidine gluconate) soap before surgery.  CHG is an antiseptic cleaner which kills germs and bonds with the skin to continue killing germs even after washing. Please DO NOT use if you have an allergy to CHG or antibacterial soaps.  If your skin becomes reddened/irritated stop using the CHG and inform your nurse when you arrive at Short Stay. Do not shave (including legs and underarms) for at least 48 hours prior to the first CHG shower.  You may shave your face/neck. Please follow these instructions carefully:  1.  Shower with  CHG Soap the night before surgery and the  morning of Surgery.  2.  If you choose to wash your hair, wash your hair first as usual with your  normal  shampoo.  3.  After you shampoo, rinse your hair and body thoroughly to remove the  shampoo.                           4.  Use CHG as you would any other liquid soap.  You can apply chg directly  to the skin and wash                       Gently with a scrungie or clean washcloth.  5.  Apply the CHG Soap to your body ONLY FROM THE NECK DOWN.   Do not use on face/ open  Wound or open sores. Avoid contact with eyes, ears mouth and genitals (private parts).                       Wash face,  Genitals (private parts) with your normal soap.             6.  Wash thoroughly, paying special attention to the area where your surgery  will be performed.  7.  Thoroughly rinse your body with warm water from the neck down.  8.  DO NOT shower/wash with your normal soap after using and rinsing off  the CHG Soap.                9.  Pat yourself dry with a clean towel.            10.  Wear clean pajamas.            11.  Place clean sheets on your bed the night of your first shower and do not  sleep with pets. Day of Surgery : Do not apply any lotions/deodorants the morning of surgery.  Please wear clean clothes to the hospital/surgery center.  FAILURE TO FOLLOW THESE INSTRUCTIONS MAY RESULT IN THE CANCELLATION OF YOUR SURGERY PATIENT SIGNATURE_________________________________  NURSE SIGNATURE__________________________________  ________________________________________________________________________   Robert [name]  An incentive spirometer is a tool that can help keep your lungs clear and active. This tool measures how well you are filling your lungs with each breath. Taking long deep breaths may help reverse or decrease the chance of developing breathing (pulmonary) problems (especially infection) following:  A long period of  time when you are unable to move or be active. BEFORE THE PROCEDURE   If the spirometer includes an indicator to show your best effort, your nurse or respiratory therapist will set it to a desired goal.  If possible, sit up straight or lean slightly forward. Try not to slouch.  Hold the incentive spirometer in an upright position. INSTRUCTIONS FOR USE  1. Sit on the edge of your bed if possible, or sit up as far as you can in bed or on a chair. 2. Hold the incentive spirometer in an upright position. 3. Breathe out normally. 4. Place the mouthpiece in your mouth and seal your lips tightly around it. 5. Breathe in slowly and as deeply as possible, raising the piston or the ball toward the top of the column. 6. Hold your breath for 3-5 seconds or for as long as possible. Allow the piston or ball to fall to the bottom of the column. 7. Remove the mouthpiece from your mouth and breathe out normally. 8. Rest for a few seconds and repeat Steps 1 through 7 at least 10 times every 1-2 hours when you are awake. Take your time and take a few normal breaths between deep breaths. 9. The spirometer may include an indicator to show your best effort. Use the indicator as a goal to work toward during each repetition. 10. After each set of 10 deep breaths, practice coughing to be sure your lungs are clear. If you have an incision (the cut made at the time of surgery), support your incision when coughing by placing a pillow or rolled up towels firmly against it. Once you are able to get out of bed, walk around indoors and cough well. You may stop using the incentive spirometer when instructed by your caregiver.  RISKS AND COMPLICATIONS  Take your time so you do not get  dizzy or light-headed.  If you are in pain, you may need to take or ask for pain medication before doing incentive spirometry. It is harder to take a deep breath if you are having pain. AFTER USE  Rest and breathe slowly and easily.  It can  be helpful to keep track of a log of your progress. Your caregiver can provide you with a simple table to help with this. If you are using the spirometer at home, follow these instructions: [place] IF:   You are having difficultly using the spirometer.  You have trouble using the spirometer as often as instructed.  Your pain medication is not giving enough relief while using the spirometer.  You develop fever of 100.5 F (38.1 C) or higher. SEEK IMMEDIATE MEDICAL CARE IF:   You cough up bloody sputum that had not been present before.  You develop fever of 102 F (38.9 C) or greater.  You develop worsening pain at or near the incision site. MAKE SURE YOU:   Understand these instructions.  Will watch your condition.  Will get help right away if you are not doing well or get worse. Document Released: 05/10/2006 Document Revised: 03/22/2011 Document Reviewed: 07/11/2006 ExitCare Patient Information 2014 ExitCare, [place].   ________________________________________________________________________  WHAT IS A BLOOD TRANSFUSION? Blood Transfusion Information  A transfusion is the replacement of blood or some of its parts. Blood is made up of multiple cells which provide different functions.  Red blood cells carry oxygen and are used for blood loss replacement.  White blood cells fight against infection.  Platelets control bleeding.  Plasma helps clot blood.  Other blood products are available for specialized needs, such as hemophilia or other clotting disorders. BEFORE THE TRANSFUSION  Who gives blood for transfusions?   Healthy volunteers who are fully evaluated to make sure their blood is safe. This is blood bank blood. Transfusion therapy is the safest it has ever been in the practice of medicine. Before blood is taken from a donor, a complete history is taken to make sure that person has no history of diseases nor engages in risky social behavior (examples are  intravenous drug use or sexual activity with multiple partners). The donor's travel history is screened to minimize risk of transmitting infections, such as malaria. The donated blood is tested for signs of infectious diseases, such as HIV and hepatitis. The blood is then tested to be sure it is compatible with you in order to minimize the chance of a transfusion reaction. If you or a relative donates blood, this is often done in anticipation of surgery and is not appropriate for emergency situations. It takes many days to process the donated blood. RISKS AND COMPLICATIONS Although transfusion therapy is very safe and saves many lives, the main dangers of transfusion include:   Getting an infectious disease.  Developing a transfusion reaction. This is an allergic reaction to something in the blood you were given. Every precaution is taken to prevent this. The decision to have a blood transfusion has been considered carefully by your caregiver before blood is given. Blood is not given unless the benefits outweigh the risks. AFTER THE TRANSFUSION  Right after receiving a blood transfusion, you will usually feel much better and more energetic. This is especially true if your red blood cells have gotten low (anemic). The transfusion raises the level of the red blood cells which carry oxygen, and this usually causes an energy increase.  The nurse administering the transfusion will  monitor you carefully for complications. HOME CARE INSTRUCTIONS  No special instructions are needed after a transfusion. You may find your energy is better. Speak with your caregiver about any limitations on activity for underlying diseases you may have. SEEK MEDICAL CARE IF:   Your condition is not improving after your transfusion.  You develop redness or irritation at the intravenous (IV) site. SEEK IMMEDIATE MEDICAL CARE IF:  Any of the following symptoms occur over the next 12 hours:  Shaking chills.  You have a  temperature by mouth above 102 F (38.9 C), not controlled by medicine.  Chest, back, or muscle pain.  People around you feel you are not acting correctly or are confused.  Shortness of breath or difficulty breathing.  Dizziness and fainting.  You get a rash or develop hives.  You have a decrease in urine output.  Your urine turns a dark color or changes to pink, red, or brown. Any of the following symptoms occur over the next 10 days:  You have a temperature by mouth above 102 F (38.9 C), not controlled by medicine.  Shortness of breath.  Weakness after normal activity.  The white part of the eye turns yellow (jaundice).  You have a decrease in the amount of urine or are urinating less often.  Your urine turns a dark color or changes to pink, red, or brown. Document Released: 12/26/1999 Document Revised: 03/22/2011 Document Reviewed: 08/14/2007 [facility] Patient Information 2014 [place], [place].  _______________________________________________________________________

## 2016-06-23 ENCOUNTER — Encounter (HOSPITAL_COMMUNITY)
Admission: RE | Admit: 2016-06-23 | Discharge: 2016-06-23 | Disposition: A | Discharge status: Home / Self Care | Payer: BLUE CROSS/BLUE SHIELD | Source: Ambulatory Visit | Attending: Urology | Admitting: Urology

## 2016-06-23 ENCOUNTER — Encounter (HOSPITAL_COMMUNITY): Payer: Self-pay

## 2016-06-23 DIAGNOSIS — I4510 Unspecified right bundle-branch block: Secondary | ICD-10-CM | POA: Insufficient documentation

## 2016-06-23 DIAGNOSIS — Z01818 Encounter for other preprocedural examination: Secondary | ICD-10-CM | POA: Insufficient documentation

## 2016-06-23 DIAGNOSIS — C61 Malignant neoplasm of prostate: Secondary | ICD-10-CM | POA: Insufficient documentation

## 2016-06-23 DIAGNOSIS — Z01810 Encounter for preprocedural cardiovascular examination: Secondary | ICD-10-CM | POA: Insufficient documentation

## 2016-06-23 DIAGNOSIS — R001 Bradycardia, unspecified: Secondary | ICD-10-CM | POA: Insufficient documentation

## 2016-06-23 DIAGNOSIS — I10 Essential (primary) hypertension: Secondary | ICD-10-CM | POA: Diagnosis not present

## 2016-06-23 HISTORY — DX: Chronic obstructive pulmonary disease, unspecified: J44.9

## 2016-06-23 HISTORY — DX: Prediabetes: R73.03

## 2016-06-23 LAB — CBC
HCT: 54.3 % — ABNORMAL HIGH (ref 39.0–52.0)
HEMOGLOBIN: 18.9 g/dL — AB (ref 13.0–17.0)
MCH: 34.1 pg — ABNORMAL HIGH (ref 26.0–34.0)
MCHC: 34.8 g/dL (ref 30.0–36.0)
MCV: 98 fL (ref 78.0–100.0)
Platelets: 163 10*3/uL (ref 150–400)
RBC: 5.54 MIL/uL (ref 4.22–5.81)
RDW: 12.6 % (ref 11.5–15.5)
WBC: 10 10*3/uL (ref 4.0–10.5)

## 2016-06-23 LAB — BASIC METABOLIC PANEL
ANION GAP: 8 (ref 5–15)
BUN: 8 mg/dL (ref 6–20)
CALCIUM: 9.1 mg/dL (ref 8.9–10.3)
CO2: 33 mmol/L — ABNORMAL HIGH (ref 22–32)
Chloride: 92 mmol/L — ABNORMAL LOW (ref 101–111)
Creatinine, Ser: 0.68 mg/dL (ref 0.61–1.24)
Glucose, Bld: 83 mg/dL (ref 65–99)
Potassium: 4.7 mmol/L (ref 3.5–5.1)
Sodium: 133 mmol/L — ABNORMAL LOW (ref 135–145)

## 2016-06-23 LAB — ABO/RH: ABO/RH(D): O POS

## 2016-06-23 NOTE — Progress Notes (Signed)
Patient had abnormal EKG at pre-op appt. RN assessed patient for any acute distress; patient only endorsed SOB with exertion (hx of smoking x 40 years). EKG and RN assessment shown and reviewed by Dr Candida Peeling of anesthesia. Sabra Heck suggests that EKG today and EKG from 2014 have different interpretations but waves appear very similar i.e. no major changes. He informed RN that there are no major reasons to delay surgery especially one to eradicate cancer. RN informed patient of anesthesia recommendations. Patient agreeable and verbalized understanding.

## 2016-06-24 LAB — HEMOGLOBIN A1C
Hgb A1c MFr Bld: 5.9 % — ABNORMAL HIGH (ref 4.8–5.6)
Mean Plasma Glucose: 123 mg/dL

## 2016-06-25 NOTE — H&P (Signed)
CC/HPI: CC: Prostate Cancer     Robert [name] is a 61 year old gentleman who was noted to have a persistently elevated PSA of 6.4 prompting a TRUS biopsy of the prostate on 03/16/16. 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   Family history: None.   Imaging studies:  Bone scan (03/30/16) - degenerative changes, no evidence of metastatic disease  CT abdomen and pelvis (03/30/16) - 12 mm non-specific para-aortic lymph node without other evidence of lymphadenopathy, metastatic disease unlikely   PMH: He has a history of hypertension and hypercholesterolemia.  PSH: No abdominal surgeries.   TNM stage: cT1c N0 M0  PSA: 6.4  Gleason score: 4+4=8  Biopsy (03/16/16): 6/12 cores positive  Left: L lateral apex (20%, 3+4=7), L apex (30%, 4+3=7), L lateral mid (20%, 4+4=8), L mid (30%, 4+3=7), L lateral base (20%, 4+4=8), L base (70%, 4+3=7)  Right: Benign  Prostate volume: 25.2 cc   Nomogram  OC disease: 23%  EPE: 74%  SVI: 20%  LNI: 20%  PFS (5 year, 10 year): 48%,33%   Urinary function: IPSS is 16. He takes tamsulosin which has improved his symptoms. They are now tolerable but were not prior to medical therapy.  Erectile function: SHIM score is 8. He estimates he can get an adequate erection for intercourse 3-4 times out of 10. He does not take oral medical therapy.     ALLERGIES: Codeine Derivatives    MEDICATIONS: Tamsulosin Hcl 0.4 mg capsule, ext release 24 hr 1 capsule PO Daily  Aspirin Ec 81 mg tablet, delayed release Oral  Benazepril-Hydrochlorothiazide 20 mg-25 mg tablet Oral  Bisoprolol-Hydrochlorothiazide 10 mg-6.25 mg tablet  Magnesium 250 mg tablet  Vitamin D3 2,000 unit tablet Oral     GU PSH: Prostate Needle Biopsy - 03/16/2016    NON-GU PSH: Surgical Pathology, Gross And Microscopic Examination For Prostate Needle - 03/16/2016    GU PMH: BPH w/LUTS - 04/13/2016, - 03/16/2016, - 02/18/2016, -  02/18/2016 Elevated PSA - 04/13/2016, - 03/16/2016, - 02/18/2016, Elevated prostate specific antigen (PSA), - 2014 Prostate Cancer - 04/13/2016 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 Smokes 1 pack per day.   Tobacco Use Assessment Completed: Used Tobacco in last 30 days? Does drink.  Drinks 4+ caffeinated drinks per day. Patient's occupation Corporate investment banker.    REVIEW OF SYSTEMS:    GU Review Male:   Patient denies frequent urination, hard to postpone urination, burning/ pain with urination, get up at night to urinate, leakage of urine, stream starts and stops, trouble starting your streams, and have to strain to urinate .  Gastrointestinal (Lower):   Patient denies diarrhea and constipation.  Gastrointestinal (Upper):   Patient denies nausea and vomiting.  Constitutional:   Patient denies fever, night sweats, weight loss, and fatigue.  Skin:   Patient denies skin rash/ lesion and itching.  Eyes:   Patient denies blurred vision and double vision.  Ears/ Nose/ Throat:   Patient denies sore throat and sinus problems.  Hematologic/Lymphatic:   Patient denies  swollen glands and easy bruising.  Cardiovascular:   Patient denies leg swelling and chest pains.  Respiratory:   Patient denies cough and shortness of breath.  Endocrine:   Patient denies excessive thirst.  Musculoskeletal:   Patient denies back pain and joint pain.  Neurological:   Patient denies headaches and dizziness.  Psychologic:    Patient denies depression and anxiety.    MULTI-SYSTEM PHYSICAL EXAMINATION:    Constitutional: Well-nourished. No physical deformities. Normally developed. Good grooming.  Neck: Neck symmetrical, not swollen. Normal tracheal position.  Respiratory: No labored breathing, no use of accessory muscles. He has mild right sided inspiratory wheezes. Otherwise clear.  Cardiovascular: Normal temperature, normal extremity pulses, no swelling, no varicosities. RRR.  Lymphatic: No enlargement of neck, axillae, groin.  Skin: No paleness, no jaundice, no cyanosis. No lesion, no ulcer, no rash.  Neurologic / Psychiatric: Oriented to time, oriented to place, oriented to person. No depression, no anxiety, no agitation.  Gastrointestinal: No mass, no tenderness, no rigidity. Moderate obesity.  Eyes: Normal conjunctivae. Normal eyelids.  Ears, Nose, Mouth, and Throat: Left ear no scars, no lesions, no masses. Right ear no scars, no lesions, no masses. Nose no scars, no lesions, no masses. Normal hearing. Normal lips.  Musculoskeletal: Normal gait and station of head and neck.     ASSESSMENT:      ICD-10 Details  1 GU:   Prostate Cancer - C61    PLAN:     1. Prostate cancer:  He has elected to proceed with surgical therapy. He will be scheduled to undergo a UNS (right) RAL radical prostatectomy and BPLND.

## 2016-06-28 ENCOUNTER — Encounter (HOSPITAL_COMMUNITY): Payer: Self-pay | Admitting: *Deleted

## 2016-06-28 ENCOUNTER — Ambulatory Visit (HOSPITAL_COMMUNITY): Payer: BLUE CROSS/BLUE SHIELD | Admitting: Anesthesiology

## 2016-06-28 ENCOUNTER — Inpatient Hospital Stay (HOSPITAL_COMMUNITY)
Admission: AD | Admit: 2016-06-28 | Discharge: 2016-07-01 | DRG: 707 | Disposition: A | Discharge status: Home / Self Care | Payer: BLUE CROSS/BLUE SHIELD | Source: Ambulatory Visit | Attending: Urology | Admitting: Urology

## 2016-06-28 ENCOUNTER — Encounter (HOSPITAL_COMMUNITY)
Admission: AD | Disposition: A | Discharge status: Home / Self Care | Payer: Self-pay | Source: Ambulatory Visit | Attending: Urology

## 2016-06-28 DIAGNOSIS — F17210 Nicotine dependence, cigarettes, uncomplicated: Secondary | ICD-10-CM | POA: Diagnosis present

## 2016-06-28 DIAGNOSIS — I10 Essential (primary) hypertension: Secondary | ICD-10-CM | POA: Diagnosis present

## 2016-06-28 DIAGNOSIS — E7800 Pure hypercholesterolemia, unspecified: Secondary | ICD-10-CM | POA: Diagnosis present

## 2016-06-28 DIAGNOSIS — N4889 Other specified disorders of penis: Secondary | ICD-10-CM | POA: Diagnosis present

## 2016-06-28 DIAGNOSIS — F17200 Nicotine dependence, unspecified, uncomplicated: Secondary | ICD-10-CM

## 2016-06-28 DIAGNOSIS — J9622 Acute and chronic respiratory failure with hypercapnia: Secondary | ICD-10-CM | POA: Diagnosis not present

## 2016-06-28 DIAGNOSIS — Z720 Tobacco use: Secondary | ICD-10-CM

## 2016-06-28 DIAGNOSIS — Z79899 Other long term (current) drug therapy: Secondary | ICD-10-CM

## 2016-06-28 DIAGNOSIS — R0902 Hypoxemia: Secondary | ICD-10-CM

## 2016-06-28 DIAGNOSIS — C61 Malignant neoplasm of prostate: Principal | ICD-10-CM | POA: Diagnosis present

## 2016-06-28 DIAGNOSIS — J9621 Acute and chronic respiratory failure with hypoxia: Secondary | ICD-10-CM | POA: Diagnosis not present

## 2016-06-28 DIAGNOSIS — J9811 Atelectasis: Secondary | ICD-10-CM | POA: Diagnosis not present

## 2016-06-28 HISTORY — PX: LYMPHADENECTOMY: SHX5960

## 2016-06-28 HISTORY — PX: ROBOT ASSISTED LAPAROSCOPIC RADICAL PROSTATECTOMY: SHX5141

## 2016-06-28 LAB — HEMOGLOBIN AND HEMATOCRIT, BLOOD
HEMATOCRIT: 55.3 % — AB (ref 39.0–52.0)
HEMOGLOBIN: 18.7 g/dL — AB (ref 13.0–17.0)

## 2016-06-28 LAB — TYPE AND SCREEN
ABO/RH(D): O POS
ANTIBODY SCREEN: NEGATIVE

## 2016-06-28 SURGERY — XI ROBOTIC ASSISTED LAPAROSCOPIC RADICAL PROSTATECTOMY LEVEL 2
Anesthesia: General

## 2016-06-28 MED ORDER — DEXAMETHASONE SODIUM PHOSPHATE 10 MG/ML IJ SOLN
INTRAMUSCULAR | Status: AC
  Filled 2016-06-28: qty 1

## 2016-06-28 MED ORDER — LIDOCAINE 2% (20 MG/ML) 5 ML SYRINGE
INTRAMUSCULAR | Status: DC | PRN
  Administered 2016-06-28: 100 mg via INTRAVENOUS

## 2016-06-28 MED ORDER — INDIGOTINDISULFONATE SODIUM 8 MG/ML IJ SOLN
INTRAMUSCULAR | Status: AC
  Filled 2016-06-28: qty 5

## 2016-06-28 MED ORDER — SUCCINYLCHOLINE CHLORIDE 200 MG/10ML IV SOSY
PREFILLED_SYRINGE | INTRAVENOUS | Status: AC
  Filled 2016-06-28: qty 10

## 2016-06-28 MED ORDER — KETOROLAC TROMETHAMINE 15 MG/ML IJ SOLN
15.0000 mg | Freq: Four times a day (QID) | INTRAMUSCULAR | Status: DC
  Administered 2016-06-28 – 2016-06-29 (×5): 15 mg via INTRAVENOUS
  Filled 2016-06-28 (×5): qty 1

## 2016-06-28 MED ORDER — ROCURONIUM BROMIDE 50 MG/5ML IV SOSY
PREFILLED_SYRINGE | INTRAVENOUS | Status: AC
  Filled 2016-06-28: qty 5

## 2016-06-28 MED ORDER — HYDROCHLOROTHIAZIDE 25 MG PO TABS
25.0000 mg | ORAL_TABLET | Freq: Every day | ORAL | Status: DC
  Administered 2016-06-29 – 2016-07-01 (×3): 25 mg via ORAL
  Filled 2016-06-28 (×3): qty 1

## 2016-06-28 MED ORDER — OXYCODONE HCL 5 MG PO TABS: 5.0000 mg | ORAL_TABLET | Freq: Once | ORAL | Status: DC | PRN

## 2016-06-28 MED ORDER — IPRATROPIUM BROMIDE 0.02 % IN SOLN
0.5000 mg | Freq: Once | RESPIRATORY_TRACT | Status: AC
  Administered 2016-06-28: 0.5 mg via RESPIRATORY_TRACT
  Filled 2016-06-28: qty 2.5

## 2016-06-28 MED ORDER — ALBUTEROL SULFATE (2.5 MG/3ML) 0.083% IN NEBU
2.5000 mg | INHALATION_SOLUTION | Freq: Once | RESPIRATORY_TRACT | Status: AC
  Administered 2016-06-28: 2.5 mg via RESPIRATORY_TRACT

## 2016-06-28 MED ORDER — ACETAMINOPHEN 325 MG PO TABS
650.0000 mg | ORAL_TABLET | ORAL | Status: DC | PRN
  Administered 2016-06-28: 650 mg via ORAL
  Filled 2016-06-28: qty 2

## 2016-06-28 MED ORDER — CEFAZOLIN SODIUM-DEXTROSE 2-4 GM/100ML-% IV SOLN
INTRAVENOUS | Status: AC
  Filled 2016-06-28: qty 100

## 2016-06-28 MED ORDER — HYDROMORPHONE HCL 1 MG/ML IJ SOLN: 0.2500 mg | INTRAMUSCULAR | Status: DC | PRN

## 2016-06-28 MED ORDER — BUPIVACAINE-EPINEPHRINE (PF) 0.25% -1:200000 IJ SOLN
INTRAMUSCULAR | Status: AC
  Filled 2016-06-28: qty 30

## 2016-06-28 MED ORDER — ALBUTEROL SULFATE (2.5 MG/3ML) 0.083% IN NEBU
2.5000 mg | INHALATION_SOLUTION | Freq: Two times a day (BID) | RESPIRATORY_TRACT | Status: DC
  Administered 2016-06-29 – 2016-06-30 (×3): 2.5 mg via RESPIRATORY_TRACT
  Filled 2016-06-28 (×3): qty 3

## 2016-06-28 MED ORDER — EPHEDRINE SULFATE-NACL 50-0.9 MG/10ML-% IV SOSY
PREFILLED_SYRINGE | INTRAVENOUS | Status: DC | PRN
  Administered 2016-06-28: 5 mg via INTRAVENOUS

## 2016-06-28 MED ORDER — SUFENTANIL CITRATE 50 MCG/ML IV SOLN
INTRAVENOUS | Status: AC
  Filled 2016-06-28: qty 1

## 2016-06-28 MED ORDER — HEPARIN SODIUM (PORCINE) 1000 UNIT/ML IJ SOLN
INTRAMUSCULAR | Status: AC
  Filled 2016-06-28: qty 1

## 2016-06-28 MED ORDER — BISOPROLOL-HYDROCHLOROTHIAZIDE 10-6.25 MG PO TABS
1.0000 | ORAL_TABLET | Freq: Every day | ORAL | Status: DC
  Administered 2016-06-29 – 2016-07-01 (×3): 1 via ORAL
  Filled 2016-06-28 (×4): qty 1

## 2016-06-28 MED ORDER — ONDANSETRON HCL 4 MG/2ML IJ SOLN
INTRAMUSCULAR | Status: AC
  Filled 2016-06-28: qty 2

## 2016-06-28 MED ORDER — MIDAZOLAM HCL 2 MG/2ML IJ SOLN
INTRAMUSCULAR | Status: AC
  Filled 2016-06-28: qty 2

## 2016-06-28 MED ORDER — LACTATED RINGERS IV SOLN
INTRAVENOUS | Status: DC | PRN
  Administered 2016-06-28: 1000 mL

## 2016-06-28 MED ORDER — PROMETHAZINE HCL 25 MG/ML IJ SOLN: 6.2500 mg | INTRAMUSCULAR | Status: DC | PRN

## 2016-06-28 MED ORDER — NALOXONE HCL 0.4 MG/ML IJ SOLN
INTRAMUSCULAR | Status: DC | PRN
  Administered 2016-06-28 (×2): 80 ug via INTRAVENOUS

## 2016-06-28 MED ORDER — ROCURONIUM BROMIDE 10 MG/ML (PF) SYRINGE
PREFILLED_SYRINGE | INTRAVENOUS | Status: DC | PRN
  Administered 2016-06-28: 20 mg via INTRAVENOUS
  Administered 2016-06-28: 50 mg via INTRAVENOUS
  Administered 2016-06-28: 20 mg via INTRAVENOUS
  Administered 2016-06-28 (×2): 10 mg via INTRAVENOUS

## 2016-06-28 MED ORDER — ALBUTEROL SULFATE (2.5 MG/3ML) 0.083% IN NEBU
INHALATION_SOLUTION | RESPIRATORY_TRACT | Status: AC
  Administered 2016-06-28: 2.5 mg via RESPIRATORY_TRACT
  Filled 2016-06-28: qty 3

## 2016-06-28 MED ORDER — DOCUSATE SODIUM 100 MG PO CAPS
100.0000 mg | ORAL_CAPSULE | Freq: Two times a day (BID) | ORAL | Status: DC
  Administered 2016-06-28 – 2016-07-01 (×6): 100 mg via ORAL
  Filled 2016-06-28 (×6): qty 1

## 2016-06-28 MED ORDER — MORPHINE SULFATE (PF) 4 MG/ML IV SOLN: 2.0000 mg | INTRAVENOUS | Status: DC | PRN

## 2016-06-28 MED ORDER — CEFAZOLIN SODIUM-DEXTROSE 1-4 GM/50ML-% IV SOLN
1.0000 g | Freq: Three times a day (TID) | INTRAVENOUS | Status: AC
  Administered 2016-06-28 – 2016-06-29 (×2): 1 g via INTRAVENOUS
  Filled 2016-06-28 (×2): qty 50

## 2016-06-28 MED ORDER — ALBUTEROL SULFATE (2.5 MG/3ML) 0.083% IN NEBU
2.5000 mg | INHALATION_SOLUTION | Freq: Four times a day (QID) | RESPIRATORY_TRACT | Status: DC
  Administered 2016-06-28: 2.5 mg via RESPIRATORY_TRACT
  Filled 2016-06-28: qty 3

## 2016-06-28 MED ORDER — SODIUM CHLORIDE 0.9 % IR SOLN
Status: DC | PRN
  Administered 2016-06-28: 1000 mL via INTRAVESICAL

## 2016-06-28 MED ORDER — OXYCODONE HCL 5 MG/5ML PO SOLN
5.0000 mg | Freq: Once | ORAL | Status: DC | PRN
  Filled 2016-06-28: qty 5

## 2016-06-28 MED ORDER — BUPIVACAINE-EPINEPHRINE 0.25% -1:200000 IJ SOLN
INTRAMUSCULAR | Status: DC | PRN
  Administered 2016-06-28: 30 mL

## 2016-06-28 MED ORDER — SUGAMMADEX SODIUM 200 MG/2ML IV SOLN
INTRAVENOUS | Status: DC | PRN
  Administered 2016-06-28: 250 mg via INTRAVENOUS

## 2016-06-28 MED ORDER — CEFAZOLIN SODIUM-DEXTROSE 2-4 GM/100ML-% IV SOLN
2.0000 g | INTRAVENOUS | Status: AC
  Administered 2016-06-28: 2 g via INTRAVENOUS

## 2016-06-28 MED ORDER — SULFAMETHOXAZOLE-TRIMETHOPRIM 800-160 MG PO TABS: 1.0000 | ORAL_TABLET | Freq: Two times a day (BID) | ORAL | 0 refills | Status: DC

## 2016-06-28 MED ORDER — DIPHENHYDRAMINE HCL 12.5 MG/5ML PO ELIX: 12.5000 mg | ORAL_SOLUTION | Freq: Four times a day (QID) | ORAL | Status: DC | PRN

## 2016-06-28 MED ORDER — BENAZEPRIL-HYDROCHLOROTHIAZIDE 20-25 MG PO TABS: 1.0000 | ORAL_TABLET | Freq: Every day | ORAL | Status: DC

## 2016-06-28 MED ORDER — LACTATED RINGERS IV SOLN
INTRAVENOUS | Status: DC
  Administered 2016-06-28 (×3): via INTRAVENOUS

## 2016-06-28 MED ORDER — ALBUTEROL SULFATE (2.5 MG/3ML) 0.083% IN NEBU: 2.5000 mg | INHALATION_SOLUTION | RESPIRATORY_TRACT | Status: DC | PRN

## 2016-06-28 MED ORDER — EPHEDRINE 5 MG/ML INJ
INTRAVENOUS | Status: AC
  Filled 2016-06-28: qty 10

## 2016-06-28 MED ORDER — DEXAMETHASONE SODIUM PHOSPHATE 10 MG/ML IJ SOLN
INTRAMUSCULAR | Status: DC | PRN
  Administered 2016-06-28: 10 mg via INTRAVENOUS

## 2016-06-28 MED ORDER — PROPOFOL 10 MG/ML IV BOLUS
INTRAVENOUS | Status: AC
  Filled 2016-06-28: qty 20

## 2016-06-28 MED ORDER — INDIGOTINDISULFONATE SODIUM 8 MG/ML IJ SOLN
INTRAMUSCULAR | Status: DC | PRN
  Administered 2016-06-28: 5 mL via INTRAVENOUS

## 2016-06-28 MED ORDER — SUGAMMADEX SODIUM 500 MG/5ML IV SOLN
INTRAVENOUS | Status: AC
  Filled 2016-06-28: qty 5

## 2016-06-28 MED ORDER — DIPHENHYDRAMINE HCL 50 MG/ML IJ SOLN: 12.5000 mg | Freq: Four times a day (QID) | INTRAMUSCULAR | Status: DC | PRN

## 2016-06-28 MED ORDER — SODIUM CHLORIDE 0.9 % IV BOLUS (SEPSIS)
1000.0000 mL | Freq: Once | INTRAVENOUS | Status: AC
  Administered 2016-06-28: 1000 mL via INTRAVENOUS

## 2016-06-28 MED ORDER — SUFENTANIL CITRATE 50 MCG/ML IV SOLN
INTRAVENOUS | Status: DC | PRN
  Administered 2016-06-28 (×2): 10 ug via INTRAVENOUS
  Administered 2016-06-28: 20 ug via INTRAVENOUS

## 2016-06-28 MED ORDER — ONDANSETRON HCL 4 MG/2ML IJ SOLN
INTRAMUSCULAR | Status: DC | PRN
  Administered 2016-06-28: 4 mg via INTRAVENOUS

## 2016-06-28 MED ORDER — BENAZEPRIL HCL 10 MG PO TABS
20.0000 mg | ORAL_TABLET | Freq: Every day | ORAL | Status: DC
  Administered 2016-06-29 – 2016-07-01 (×3): 20 mg via ORAL
  Filled 2016-06-28 (×3): qty 2

## 2016-06-28 MED ORDER — HYDROCODONE-ACETAMINOPHEN 5-325 MG PO TABS: 1.0000 | ORAL_TABLET | Freq: Four times a day (QID) | ORAL | 0 refills | Status: DC | PRN

## 2016-06-28 MED ORDER — KCL IN DEXTROSE-NACL 20-5-0.45 MEQ/L-%-% IV SOLN
INTRAVENOUS | Status: DC
  Administered 2016-06-28 – 2016-06-29 (×2): via INTRAVENOUS
  Filled 2016-06-28 (×3): qty 1000

## 2016-06-28 MED ORDER — LIDOCAINE 2% (20 MG/ML) 5 ML SYRINGE
INTRAMUSCULAR | Status: AC
  Filled 2016-06-28: qty 5

## 2016-06-28 MED ORDER — PROPOFOL 10 MG/ML IV BOLUS
INTRAVENOUS | Status: DC | PRN
  Administered 2016-06-28: 200 mg via INTRAVENOUS

## 2016-06-28 SURGICAL SUPPLY — 57 items
ADH SKN CLS APL DERMABOND .7 (GAUZE/BANDAGES/DRESSINGS)
APPLICATOR COTTON TIP 6IN STRL (MISCELLANEOUS) ×3 IMPLANT
BANDAGE COBAN STERILE 2 (GAUZE/BANDAGES/DRESSINGS) ×2 IMPLANT
BNDG CONFORM 2 STRL LF (GAUZE/BANDAGES/DRESSINGS) ×2 IMPLANT
CATH FOLEY 2WAY SLVR 18FR 30CC (CATHETERS) ×3 IMPLANT
CATH ROBINSON RED A/P 16FR (CATHETERS) ×3 IMPLANT
CATH ROBINSON RED A/P 8FR (CATHETERS) ×3 IMPLANT
CATH TIEMANN FOLEY 18FR 5CC (CATHETERS) ×3 IMPLANT
CHLORAPREP W/TINT 26ML (MISCELLANEOUS) ×3 IMPLANT
CLIP LIGATING HEM O LOK PURPLE (MISCELLANEOUS) ×6 IMPLANT
COVER SURGICAL LIGHT HANDLE (MISCELLANEOUS) ×3 IMPLANT
COVER TIP SHEARS 8 DVNC (MISCELLANEOUS) ×1 IMPLANT
COVER TIP SHEARS 8MM DA VINCI (MISCELLANEOUS) ×4
CUTTER ECHEON FLEX ENDO 45 340 (ENDOMECHANICALS) ×3 IMPLANT
DECANTER SPIKE VIAL GLASS SM (MISCELLANEOUS) ×1 IMPLANT
DERMABOND ADVANCED (GAUZE/BANDAGES/DRESSINGS)
DERMABOND ADVANCED .7 DNX12 (GAUZE/BANDAGES/DRESSINGS) IMPLANT
DRAPE ARM DVNC X/XI (DISPOSABLE) ×4 IMPLANT
DRAPE COLUMN DVNC XI (DISPOSABLE) ×1 IMPLANT
DRAPE DA VINCI XI ARM (DISPOSABLE) ×8
DRAPE DA VINCI XI COLUMN (DISPOSABLE) ×2
DRAPE SURG IRRIG POUCH 19X23 (DRAPES) ×3 IMPLANT
DRSG TEGADERM 4X4.75 (GAUZE/BANDAGES/DRESSINGS) ×3 IMPLANT
ELECT REM PT RETURN 15FT ADLT (MISCELLANEOUS) ×3 IMPLANT
GLOVE BIO SURGEON STRL SZ 6.5 (GLOVE) ×2 IMPLANT
GLOVE BIO SURGEONS STRL SZ 6.5 (GLOVE) ×1
GLOVE BIOGEL M STRL SZ7.5 (GLOVE) ×6 IMPLANT
GOWN STRL REUS W/TWL LRG LVL3 (GOWN DISPOSABLE) ×9 IMPLANT
HOLDER FOLEY CATH W/STRAP (MISCELLANEOUS) ×3 IMPLANT
IRRIG SUCT STRYKERFLOW 2 WTIP (MISCELLANEOUS) ×3
IRRIGATION SUCT STRKRFLW 2 WTP (MISCELLANEOUS) ×1 IMPLANT
IV LACTATED RINGERS 1000ML (IV SOLUTION) ×1 IMPLANT
NDL SAFETY ECLIPSE 18X1.5 (NEEDLE) ×1 IMPLANT
NEEDLE HYPO 18GX1.5 SHARP (NEEDLE) ×3
PACK ROBOT UROLOGY CUSTOM (CUSTOM PROCEDURE TRAY) ×3 IMPLANT
RELOAD STAPLE 45 4.1 GRN THCK (STAPLE) ×1 IMPLANT
SEAL CANN UNIV 5-8 DVNC XI (MISCELLANEOUS) ×4 IMPLANT
SEAL XI 5MM-8MM UNIVERSAL (MISCELLANEOUS) ×8
SOLUTION ELECTROLUBE (MISCELLANEOUS) ×3 IMPLANT
STAPLE RELOAD 45 GRN (STAPLE) ×1 IMPLANT
STAPLE RELOAD 45MM GREEN (STAPLE) ×3
SUT ETHILON 3 0 PS 1 (SUTURE) ×3 IMPLANT
SUT MNCRL 3 0 RB1 (SUTURE) ×1 IMPLANT
SUT MNCRL 3 0 VIOLET RB1 (SUTURE) ×1 IMPLANT
SUT MNCRL AB 4-0 PS2 18 (SUTURE) ×6 IMPLANT
SUT MONOCRYL 3 0 RB1 (SUTURE) ×4
SUT VIC AB 0 CT1 27 (SUTURE) ×3
SUT VIC AB 0 CT1 27XBRD ANTBC (SUTURE) ×1 IMPLANT
SUT VIC AB 0 UR5 27 (SUTURE) ×3 IMPLANT
SUT VIC AB 2-0 SH 27 (SUTURE) ×3
SUT VIC AB 2-0 SH 27X BRD (SUTURE) ×1 IMPLANT
SUT VICRYL 0 UR6 27IN ABS (SUTURE) ×6 IMPLANT
SYR 27GX1/2 1ML LL SAFETY (SYRINGE) ×3 IMPLANT
TOWEL OR 17X26 10 PK STRL BLUE (TOWEL DISPOSABLE) ×3 IMPLANT
TOWEL OR NON WOVEN STRL DISP B (DISPOSABLE) ×3 IMPLANT
TUBING INSUFFLATION 10FT LAP (TUBING) IMPLANT
WATER STERILE IRR 1000ML POUR (IV SOLUTION) ×2 IMPLANT

## 2016-06-28 NOTE — Discharge Instructions (Signed)

## 2016-06-28 NOTE — Anesthesia Procedure Notes (Signed)
Procedure Name: Intubation Date/Time: 06/28/2016 11:42 AM Performed by: Danley Danker L Patient Re-evaluated:Patient Re-evaluated prior to inductionOxygen Delivery Method: Circle system utilized Preoxygenation: Pre-oxygenation with 100% oxygen Intubation Type: IV induction Ventilation: Mask ventilation without difficulty and Oral airway inserted - appropriate to patient size Laryngoscope Size: Miller and 3 Grade View: Grade I Tube size: 8.0 mm Number of attempts: 1 Airway Equipment and Method: Stylet Placement Confirmation: ETT inserted through vocal cords under direct vision,  positive ETCO2 and breath sounds checked- equal and bilateral Secured at: 22 cm Tube secured with: Tape Dental Injury: Teeth and Oropharynx as per pre-operative assessment

## 2016-06-28 NOTE — Anesthesia Postprocedure Evaluation (Signed)
Anesthesia Post Note  Patient: Robert [name]  Procedure(s) Performed: Procedure(s) (LRB): XI ROBOTIC ASSISTED LAPAROSCOPIC RADICAL PROSTATECTOMY LEVEL 2 EXCISION OF PENILE LESION (N/A) LYMPHADENECTOMY (N/A)     Patient location during evaluation: PACU Anesthesia Type: General Level of consciousness: awake and alert Pain management: pain level controlled Vital Signs Assessment: post-procedure vital signs reviewed and stable Respiratory status: spontaneous breathing, nonlabored ventilation, respiratory function stable and patient connected to nasal cannula oxygen Cardiovascular status: blood pressure returned to baseline and stable Postop Assessment: no signs of nausea or vomiting Anesthetic complications: no    Last Vitals:  Vitals:   06/28/16 1645 06/28/16 1700  BP: 131/68 121/72  Pulse: 66 64  Resp: (!) 22 14  Temp:      Last Pain:  Vitals:   06/28/16 1645  TempSrc:   PainSc: Asleep                 Ryan P Ellender

## 2016-06-28 NOTE — Op Note (Signed)
Preoperative diagnosis: Clinically localized adenocarcinoma of the prostate (clinical stage T1c N0 M0), penile lesion  Postoperative diagnosis: Clinically localized adenocarcinoma of the prostate (clinical stage T1c N0 M0), penile lesion  Procedure:  1. Robotic assisted laparoscopic radical prostatectomy (right nerve sparing) 2. Bilateral robotic assisted laparoscopic pelvic lymphadenectomy 3. Excision of penile lesion  Surgeon: [name]. M.D.  Assistant(s): Debbrah Alar, PA-C  An assistant was required for this surgical procedure.  The duties of the assistant included but were not limited to suctioning, passing suture, camera manipulation, retraction. This procedure would not be able to be performed without an Environmental consultant.  Resident: Dr. Lorayne Bender  Anesthesia: General  Complications: None  EBL: 100 mL  IVF:  1300 mL crystalloid  Specimens: 1. Prostate and seminal vesicles 2. Right pelvic lymph nodes 3. Left pelvic lymph nodes 4. Penile lesion  Disposition of specimens: Pathology  Drains: 1. 20 Fr coude catheter 2. # 19 Blake pelvic drain  Indication: Robert [name] is a 61 y.o. patient with clinically localized prostate cancer.  After a thorough review of the management options for treatment of prostate cancer, he elected to proceed with surgical therapy and the above procedure(s).  We have discussed the potential benefits and risks of the procedure, side effects of the proposed treatment, the likelihood of the patient achieving the goals of the procedure, and any potential problems that might occur during the procedure or recuperation. Informed consent has been obtained.    Description of procedure:  The patient was taken to the operating room and a general anesthetic was administered. He was given preoperative antibiotics, placed in the dorsal lithotomy position, and prepped and draped in the usual sterile fashion. On exam, he was noted to have a concerning  penile lesion that raised suspicion for possible skin malignancy.  As the patient was already intubated, I called out to his wife and spoke to her about this finding.  She was not aware that he had a penile lesion making it difficult to know how long it had been present.  Considering the potential risk of another anesthetic due to his pulmonary status, I offered to perform excision of the penile lesion at her discretion.  After discussion, she agreed that excision would be most prudent to avoid another potential anesthetic later.  We discussed risks and potential complications and she gave informed consent for this portion of the procedure verbally.  Next a preoperative timeout was performed. A urethral catheter was placed into the bladder and a site was selected near the umbilicus for placement of the camera port. This was placed using a standard open Hassan technique which allowed entry into the peritoneal cavity under direct vision and without difficulty. An 8 mm port was placed and a pneumoperitoneum established. The camera was then used to inspect the abdomen and there was no evidence of any intra-abdominal injuries or other abnormalities. The remaining abdominal ports were then placed. 8 mm robotic ports were placed in the right lower quadrant, left lower quadrant, and far left lateral abdominal wall. A 5 mm port was placed in the right upper quadrant and a 12 mm port was placed in the right lateral abdominal wall for laparoscopic assistance. All ports were placed under direct vision without difficulty. The surgical cart was then docked.   Utilizing the cautery scissors, the bladder was reflected posteriorly allowing entry into the space of Retzius and identification of the endopelvic fascia and prostate. The periprostatic fat was then removed from the prostate  allowing full exposure of the endopelvic fascia. The endopelvic fascia was then incised from the apex back to the base of the prostate bilaterally  and the underlying levator muscle fibers were swept laterally off the prostate thereby isolating the dorsal venous complex. The dorsal vein was then stapled and divided with a 45 mm Flex Echelon stapler. Attention then turned to the bladder neck which was divided anteriorly thereby allowing entry into the bladder and exposure of the urethral catheter. The catheter balloon was deflated and the catheter was brought into the operative field and used to retract the prostate anteriorly. The posterior bladder neck was then examined and was divided allowing further dissection between the bladder and prostate posteriorly until the vasa deferentia and seminal vessels were identified. The vasa deferentia were isolated, divided, and lifted anteriorly. The seminal vesicles were dissected down to their tips with care to control the seminal vascular arterial blood supply. These structures were then lifted anteriorly and the space between Denonvillier's fascia and the anterior rectum was developed with a combination of sharp and blunt dissection. This isolated the vascular pedicles of the prostate.  The lateral prostatic fascia on the right side of the prostate was then sharply incised allowing release of the neurovascular bundle. The vascular pedicle of the prostate on the right side was then ligated with Weck clips between the prostate and neurovascular bundle and divided with sharp cold scissor dissection resulting in neurovascular bundle preservation. On the left side, a wide non nerve sparing dissection was performed with Weck clips used to ligate the vascular pedicle of the prostate. The neurovascular bundle on the right side was then separated off the apex of the prostate and urethra.  The urethra was then sharply transected allowing the prostate specimen to be disarticulated. The pelvis was copiously irrigated and hemostasis was ensured. There was no evidence for rectal injury.  Attention then turned to the right  pelvic sidewall. The fibrofatty tissue between the external iliac vein, confluence of the iliac vessels, hypogastric artery, and Goodreau's ligament was dissected free from the pelvic sidewall with care to preserve the obturator nerve. Weck clips were used for lymphostasis and hemostasis. An identical procedure was performed on the contralateral side and the lymphatic packets were removed for permanent pathologic analysis.  Attention then turned to the urethral anastomosis. A 2-0 Vicryl slip knot was placed between Denonvillier's fascia, the posterior bladder neck, and the posterior urethra to reapproximate these structures. A double-armed 3-0 Monocryl suture was then used to perform a 360 running tension-free anastomosis between the bladder neck and urethra. A new urethral catheter was then placed into the bladder and irrigated. There were no blood clots within the bladder and the anastomosis appeared to be watertight. A #19 Blake drain was then brought through the left lateral 8 mm port site and positioned appropriately within the pelvis. It was secured to the skin with a nylon suture. The surgical cart was then undocked. The right lateral 12 mm port site was closed at the fascial level with a 0 Vicryl suture placed laparoscopically. All remaining ports were then removed under direct vision. The prostate specimen was removed intact within the Endopouch retrieval bag via the periumbilical camera port site. This fascial opening was closed with two running 0 Vicryl sutures. 0.25% Marcaine was then injected into all port sites and all incisions were reapproximated at the skin level with 4-0 Monocryl subcuticular sutures. Liquiband was applied.  Attention then turned to penile lesion.  This lesion appeared discolored with  irregular borders and measured about 1.5 - 2.0 cm on the distal left shaft of the penis.  I used an elliptical incision to excise the lesion completely.  It was mobile.  Hemostasis was ensured and a  horizontal mattress suture with 3-0 chromic sutures was used to close the defect.  A sterile dressing was applied with gauze and coband. The patient appeared to tolerate the procedure well and without complications. The patient was able to be extubated and transferred to the recovery unit in satisfactory condition.   [name] MD

## 2016-06-28 NOTE — Progress Notes (Signed)
Patient ID: Robert [name], male   DOB: [birth year], 61 y.o.   MRN: [id number]  Post-op note  Subjective: The patient is doing well.  No complaints.  Objective: Vital signs in last 24 hours: Temp:  [97.8 F (36.6 C)-98 F (36.7 C)] 97.9 F (36.6 C) (06/18 1715) Pulse Rate:  [56-80] 61 (06/18 1715) Resp:  [13-22] 17 (06/18 1715) BP: (121-156)/(66-99) 132/84 (06/18 1715) SpO2:  [88 %-100 %] 94 % (06/18 1715) Weight:  [107 kg (236 lb)] 107 kg (236 lb) (06/18 0955)  Intake/Output from previous day: No intake/output data recorded. Intake/Output this shift: Total I/O In: 3400 [I.V.:2400; IV Piggyback:1000] Out: 270 [Urine:60; Drains:110; Blood:100]  Physical Exam:  General: Alert and oriented. Abdomen: Soft, Nondistended. Incisions: Clean and dry. GU: Urine output low but catheter irrigates well.  He is receiving bolus.  Lab Results:  Recent Labs  06/28/16 1554  HGB 18.7*  HCT 55.3*    Assessment/Plan: POD#0   1) Continue to monitor, ambulate, IS 2) Pulmonary toilet aggressively, Nebulizers 3) Discuss penile lesion and excision   [name]. MD   LOS: 0 days   Desere Gwin,LES 06/28/2016, 5:32 PM

## 2016-06-28 NOTE — Transfer of Care (Signed)
Immediate Anesthesia Transfer of Care Note  Patient: Robert [name]  Procedure(s) Performed: Procedure(s): XI ROBOTIC ASSISTED LAPAROSCOPIC RADICAL PROSTATECTOMY LEVEL 2 EXCISION OF PENILE LESION (N/A) LYMPHADENECTOMY (N/A)  Patient Location: PACU  Anesthesia Type:General  Level of Consciousness: awake, alert  and oriented  Airway & Oxygen Therapy: Patient Spontanous Breathing and non-rebreather face mask  Post-op Assessment: Report given to RN and Post -op Vital signs reviewed and stable  Post vital signs: Reviewed and stable  Last Vitals:  Vitals:   06/28/16 1115 06/28/16 1120  BP:    Pulse: (!) 56 (!) 59  Resp:    Temp:      Last Pain:  Vitals:   06/28/16 0955  TempSrc:   PainSc: 0-No pain      Patients Stated Pain Goal: 3 (41/03/01 3143)  Complications: No apparent anesthesia complications

## 2016-06-28 NOTE — Interval H&P Note (Signed)
History and Physical Interval Note:  06/28/2016 11:53 AM  Robert [name]  has presented today for surgery, with the diagnosis of PROSTATE CANCER  The various methods of treatment have been discussed with the patient and family. After consideration of risks, benefits and other options for treatment, the patient has consented to  Procedure(s): XI ROBOTIC [place] 2 (N/A) LYMPHADENECTOMY (N/A) as a surgical intervention .  The patient's history has been reviewed, patient examined, no change in status, stable for surgery.  I have reviewed the patient's chart and labs.  Questions were answered to the patient's satisfaction.    Pt was noted to be hypoxemic on room air (90-92%, 94-95% with deep breathing).  This was discussed with anesthesia considering his history of smoking 1 1/2 PPD.  The patient understands the increased risk of pulmonary complications and does wish to proceed despite offering him the option of radiation and androgen deprivation again.     [name],LES

## 2016-06-28 NOTE — Anesthesia Preprocedure Evaluation (Addendum)
Anesthesia Evaluation  Patient identified by MRN, date of birth, ID band Patient awake    Reviewed: Allergy & Precautions, NPO status , Patient's Chart, lab work & pertinent test results  Airway Mallampati: II  TM Distance: >3 FB Neck ROM: Full    Dental no notable dental hx.    Pulmonary COPD (mild, controlled), Current Smoker (1 1/2 ppd),    Pulmonary exam normal breath sounds clear to auscultation       Cardiovascular hypertension, Pt. on medications and Pt. on home beta blockers Normal cardiovascular exam Rhythm:Regular Rate:Normal  ECG: SB, incomplete RBBB   Neuro/Psych negative neurological ROS  negative psych ROS   GI/Hepatic negative GI ROS, Neg liver ROS,   Endo/Other  negative endocrine ROS  Renal/GU negative Renal ROS  negative genitourinary   Musculoskeletal negative musculoskeletal ROS (+)   Abdominal   Peds negative pediatric ROS (+)  Hematology negative hematology ROS (+)   Anesthesia Other Findings Hyperlipidemia Obese, BMI 37  Reproductive/Obstetrics negative OB ROS                            Anesthesia Physical Anesthesia Plan  ASA: III  Anesthesia Plan: General   Post-op Pain Management:    Induction: Intravenous  PONV Risk Score and Plan: 1 and Ondansetron, Dexamethasone, Propofol and Midazolam  Airway Management Planned: Oral ETT  Additional Equipment:   Intra-op Plan:   Post-operative Plan: Extubation in OR  Informed Consent: I have reviewed the patients History and Physical, chart, labs and discussed the procedure including the risks, benefits and alternatives for the proposed anesthesia with the patient or authorized representative who has indicated his/her understanding and acceptance.   Dental advisory given  Plan Discussed with: CRNA and Surgeon  Anesthesia Plan Comments: (Additional post operative oxygen support discussed with patient and  surgical team)       Anesthesia Quick Evaluation

## 2016-06-29 ENCOUNTER — Observation Stay (HOSPITAL_COMMUNITY): Payer: BLUE CROSS/BLUE SHIELD

## 2016-06-29 ENCOUNTER — Encounter (HOSPITAL_COMMUNITY): Payer: Self-pay | Admitting: Urology

## 2016-06-29 DIAGNOSIS — F17200 Nicotine dependence, unspecified, uncomplicated: Secondary | ICD-10-CM

## 2016-06-29 DIAGNOSIS — J9601 Acute respiratory failure with hypoxia: Secondary | ICD-10-CM | POA: Diagnosis not present

## 2016-06-29 DIAGNOSIS — R918 Other nonspecific abnormal finding of lung field: Secondary | ICD-10-CM

## 2016-06-29 DIAGNOSIS — Z720 Tobacco use: Secondary | ICD-10-CM

## 2016-06-29 LAB — BASIC METABOLIC PANEL
ANION GAP: 5 (ref 5–15)
BUN: 9 mg/dL (ref 6–20)
CALCIUM: 8.4 mg/dL — AB (ref 8.9–10.3)
CO2: 33 mmol/L — ABNORMAL HIGH (ref 22–32)
CREATININE: 0.8 mg/dL (ref 0.61–1.24)
Chloride: 94 mmol/L — ABNORMAL LOW (ref 101–111)
Glucose, Bld: 153 mg/dL — ABNORMAL HIGH (ref 65–99)
Potassium: 4.8 mmol/L (ref 3.5–5.1)
SODIUM: 132 mmol/L — AB (ref 135–145)

## 2016-06-29 LAB — HEMOGLOBIN AND HEMATOCRIT, BLOOD
HCT: 51.7 % (ref 39.0–52.0)
HEMOGLOBIN: 17.3 g/dL — AB (ref 13.0–17.0)

## 2016-06-29 MED ORDER — BISACODYL 10 MG RE SUPP
10.0000 mg | Freq: Once | RECTAL | Status: AC
  Administered 2016-06-29: 10 mg via RECTAL
  Filled 2016-06-29: qty 1

## 2016-06-29 MED ORDER — HYDROCODONE-ACETAMINOPHEN 5-325 MG PO TABS: 1.0000 | ORAL_TABLET | Freq: Four times a day (QID) | ORAL | Status: DC | PRN

## 2016-06-29 MED ORDER — FUROSEMIDE 10 MG/ML IJ SOLN
20.0000 mg | Freq: Once | INTRAMUSCULAR | Status: AC
  Administered 2016-06-29: 20 mg via INTRAVENOUS
  Filled 2016-06-29: qty 2

## 2016-06-29 NOTE — Progress Notes (Signed)
Patient ID: Robert [name], male   DOB: [birth year], 61 y.o.   MRN: [id number]  1 Day Post-Op Subjective: The patient is doing well.  No nausea or vomiting. Pain is adequately controlled.  Objective: Vital signs in last 24 hours: Temp:  [97.8 F (36.6 C)-99.1 F (37.3 C)] 98.6 F (37 C) (06/19 2446) Pulse Rate:  [56-84] 65 (06/19 0613) Resp:  [13-22] 17 (06/19 0613) BP: (105-156)/(62-99) 110/65 (06/19 0613) SpO2:  [88 %-100 %] 96 % (06/19 0613) Weight:  [107 kg (236 lb)] 107 kg (236 lb) (06/18 0955)  Intake/Output from previous day: 06/18 0701 - 06/19 0700 In: 5169.6 [P.O.:180; I.V.:3889.6; IV Piggyback:1100] Out: 1015 [Urine:660; Drains:255; Blood:100] Intake/Output this shift: No intake/output data recorded.  Physical Exam:  General: Alert and oriented. CV: RRR Lungs: Clear bilaterally. GI: Soft, Nondistended. Incisions: Clean, dry, and intact Urine: Clear Extremities: Nontender, no erythema, no edema.  Lab Results:  Recent Labs  06/28/16 1554  HGB 18.7*  HCT 55.3*      Assessment/Plan: POD# 1 s/p robotic prostatectomy.  LABS NOT DRAWN YET THIS MORNING.  PLAN WILL NEED TO WAIT UNTIL LABS ARE BACK.  LATE LAB DRAW MAY DELAY PROGRESSION OF PATIENT AND DISCHARGE OF THIS PATIENT.  IF Hgb is stable, will proceed with the following plan:  1) SL IVF 2) Ambulate, Incentive spirometry 3) Transition to oral pain medication 4) Dulcolax suppository 5) D/C pelvic drain 6) Plan for likely discharge later today   Robert [name]. MD   LOS: 0 days   [name],LES 06/29/2016, 7:51 AM

## 2016-06-29 NOTE — Consult Note (Signed)
[facility] Consult  Physician Requesting Consult:  Raynelle Bring, M.D. / Urology   Date of Consult:  06/29/2016  Reason for Consult/Chief Complaint:  Acute Hypoxic Respiratory Failure  History of Presenting Illness:  60 y.o. male with a long-standing history of tobacco use. Patient presented for robotic-assisted laparoscopic radical prostatectomy yesterday. Per documentation patient was noted to be saturating 80% at rest before ambulation today. Patient during ambulation was saturating 80% on room air as well. Patient required oxygen at 2 L/m to improve saturation to 94%. Per documentation patient is approximately 3.7 L positive since admission. Lasix has already been ordered for administration. Patient denies any chest pain, pressure, tightness, or palpitations. Reports his baseline nonproductive cough but has noticed some mild wheezing. Denies any dyspnea with attempted ambulation today. No subjective fever, chills, or sweats. Does have some abdominal discomfort but no nausea or vomiting. Denies any near syncope or vertigo. Patient reports as a child he did have some breathing problems. He also notably has some mild seasonal allergic rhinitis. Denies any history of chronic bronchitis but does receive treatment approximately once yearly for bronchitis. No history of pneumonia. No history of hypoxia. No history of any other breathing problems or chronic inhaled medication use. Patient has had no prior history of hospitalization for breathing problems. He reports he did receive sedation with a previous colonoscopy without adverse effect but has never been endotracheally intubated otherwise.  Review of Systems:  No rashes or abnormal bruising. A pertinent 14 point review of systems is negative except as per the history of presenting illness.  Allergies  Allergen Reactions  . Codeine     REACTION: Itching  . Isovue [Iopamidol] Hives    Pt. Developed 1 hive after injection; Dr.  Kris Hartmann spoke with patient;recommends 13 hr prep in the future    No current facility-administered medications on file prior to encounter.    Current Outpatient Prescriptions on File Prior to Encounter  Medication Sig Dispense Refill  . bisoprolol-hydrochlorothiazide (ZIAC) 10-6.25 MG tablet TAKE ONE TABLET BY MOUTH DAILY 90 tablet 0  . tamsulosin (FLOMAX) 0.4 MG CAPS capsule Take 0.4 mg by mouth daily.       Past Medical History:  Diagnosis Date  . 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 Elevated hemoglobin A1c   . Elevated PSA 03/16/2016  . Hyperlipidemia   . Hypertension   . Personal history of colonic adenomas 12/28/2007  . Pre-diabetes   . Prostate cancer ([place]) 03/16/2016  . Vitamin D deficiency     Past Surgical History:  Procedure Laterality Date  . COLONOSCOPY    . LYMPHADENECTOMY N/A 06/28/2016   Procedure: LYMPHADENECTOMY;  Surgeon: Raynelle Bring, MD;  Location: [facility];  Service: Urology;  Laterality: N/A;  . PROSTATE BIOPSY  03/16/2016   Surgical Pathology Gross and Microscopic Examination for Prostate Needle  . ROBOT ASSISTED LAPAROSCOPIC RADICAL PROSTATECTOMY N/A 06/28/2016   Procedure: XI ROBOTIC ASSISTED LAPAROSCOPIC RADICAL PROSTATECTOMY LEVEL 2 EXCISION OF PENILE LESION;  Surgeon: Raynelle Bring, MD;  Location: [facility];  Service: Urology;  Laterality: N/A;    Family History  Problem Relation Age of Onset  . Cancer Mother        lung  . Lung disease Neg Hx     Social History   Social History  . Marital status: Married    Spouse name: N/A  . Number of children: N/A  . Years of education: N/A   Social History Main Topics  . Smoking status: Current Every  Day Smoker    Packs/day: 1.50    Years: 40.00    Types: Cigarettes  . Smokeless tobacco: Never Used     Comment: Peak rate of 2ppd. Has never attempted to quit.   . Alcohol use 2.4 oz/week    4 Cans of beer per week  . Drug use: No  . Sexual activity: Yes   Other Topics Concern  . None   Social  History Narrative   [facility] (06/29/16):   Currently works as a Furniture conservator/restorer. Exposure primarily to aluminum and steel. No history of bird or mold exposure. No recent hot tub exposure. Does have 2 dogs.    Temp:  [97.9 F (36.6 C)-99.1 F (37.3 C)] 98.2 F (36.8 C) (06/19 1431) Pulse Rate:  [53-84] 53 (06/19 1431) Resp:  [14-18] 18 (06/19 1431) BP: (105-132)/(60-84) 108/60 (06/19 1431) SpO2:  [80 %-98 %] 98 % (06/19 1431)  General:  Sleeping in chair until awoken. Now awake. Alert. No acute distress.  Integument:  Warm & dry. No rash or bruising on exposed skin.  Extremities:  No cyanosis or clubbing.  Lymphatics:  No appreciated cervical or supraclavicular lymphadenoapthy. HEENT:  Moist mucus membranes. No oral ulcers. No scleral injection or icterus.  Cardiovascular:  Regular rate & rhythm. No edema. No JVD appreciated.  Pulmonary:  Diminished breath sounds bilateral lung bases with some very mild faint end expiratory wheezing in the apices. Normal work of breathing on nasal cannula oxygen. Able to inhale approximately 600 mL on incentive spirometer. Abdomen: Soft. Normal bowel sounds. Protuberant. Nontender. Musculoskeletal:  Normal bulk and tone. Hand grip strength 5/5 bilaterally. No joint deformity or effusion appreciated. Minimal synovial thickening of bilateral PIP & DIP joints. Neurological:  Cranial nerves 2-12 grossly in tact. No meningismus. Moving all 4 extremities equally.  Psychiatric:  Mood and affect congruent. Speech normal rhythm, rate & tone.   CBC Latest Ref Rng & Units 06/29/2016 06/28/2016 06/23/2016  WBC 4.0 - 10.5 K/uL - - 10.0  Hemoglobin 13.0 - 17.0 g/dL 17.3(H) 18.7(H) 18.9(H)  Hematocrit 39.0 - 52.0 % 51.7 55.3(H) 54.3(H)  Platelets 150 - 400 K/uL - - 163   BMP Latest Ref Rng & Units 06/23/2016 01/13/2016 06/17/2015  Glucose 65 - 99 mg/dL 83 98 81  BUN 6 - 20 mg/dL 8 10 7   Creatinine 0.61 - 1.24 mg/dL 0.68 0.65(L) 0.58(L)  Sodium 135 - 145 mmol/L 133(L)  133(L) 134(L)  Potassium 3.5 - 5.1 mmol/L 4.7 3.8 4.0  Chloride 101 - 111 mmol/L 92(L) 91(L) 98  CO2 22 - 32 mmol/L 33(H) 31 27  Calcium 8.9 - 10.3 mg/dL 9.1 9.0 8.6    IMAGING/STUDIES: CXR PA/LAT 06/29/16 (personally reviewed by me):  No focal opacity appreciated. Linear opacification in bilateral lung zones with low lung volumes suggestive of atelectasis. No pleural effusion. Borderline cardiomegaly.  MICROBIOLOGY: None.  ANTIBIOTICS: Ancef 6/18 - 6/19 (periop prophylaxis)  ASSESSMENT/PLAN:  61 y.o. male status post robotic prostatectomy with postoperative hypoxia. Physical exam consistent with atelectasis. Still suspect some element of volume overload given wheezing on auscultation. Certainly possible the patient has underlying COPD but I do not feel the wheezing on physical exam his secondary to an airway process but rather edema. He has no other symptoms that would suggest a pneumonia/infectious process.  1. Acute hypoxic respiratory failure: Likely multifactorial from pulmonary edema and atelectasis. Patient counseled on appropriate technique and frequency of incentive spirometry for pulmonary toilet. Ordering daily weights. Also ordering thrice daily ambulation with oxygen  titration. Weaning FiO2 for saturation greater than 88%. Agree with Lasix 20 mg IV 1. Ordering basic metabolic panel now as well as in the morning to ensure electrolyte stability in the setting of use of both Lasix and hydrochlorothiazide. 2. Lung opacity on imaging: Suspect this represents atelectasis. No indication for antibiotic therapy.  3. Tobacco use disorder: Patient counseled for over 3 minutes on the for complete tobacco cessation. Recommended using nicotine patches with gum for intermittent cravings.  Remainder of care as per primary service & other consultants.  Sonia Baller Ashok Cordia, M.D. [facility] Pager:  [phone] After 3pm or if no response, call [phone] 4:55 PM  06/29/16

## 2016-06-29 NOTE — Care Management Note (Signed)
Case Management Note  Patient Details  Name: Robert [name] MRN: [id number] Date of Birth: 12/26/1955  Subjective/Objective:   Noted on 02-if home 02 needed can arrange w/documented progress note w/02 sats,& if qualifies-home 02 order w/liter flow/duration/route.                 Action/Plan:d/c plan home.   Expected Discharge Date:                  Expected Discharge Plan:  Home/Self Care  In-House Referral:     Discharge planning Services  CM Consult  Post Acute Care Choice:    Choice offered to:     DME Arranged:    DME Agency:     HH Arranged:    HH Agency:     Status of Service:  In process, will continue to follow  If discussed at Long Length of Stay Meetings, dates discussed:    Additional Comments:  Dessa Phi, RN 06/29/2016, 10:57 AM

## 2016-06-29 NOTE — Progress Notes (Addendum)
SATURATION QUALIFICATIONS: (This note is used to comply with regulatory documentation for home oxygen)  Patient Saturations on Room Air at Rest = 80%  Patient Saturations on Room Air while Ambulating = 80%  Patient Saturations on 2 Liters of oxygen while Ambulating = 94%  Please briefly explain why patient needs home oxygen: Alternated methods attempted and were proven insufficient.

## 2016-06-29 NOTE — Progress Notes (Signed)
Patient ID: Robert [name], male   DOB: 11/23/1955, 61 y.o.   MRN: [id number]  1 Day Post-Op Subjective: Pt doing ok today.  Ambulating.  Denies SOB.  However, noted to be persistently hypoxic on RA (80%).  He was somewhat hypoxic at baseline on presentation prior to surgery yesterday (90-92% on RA).  Objective: Vital signs in last 24 hours: Temp:  [98.1 F (36.7 C)-99.1 F (37.3 C)] 98.2 F (36.8 C) (06/19 1431) Pulse Rate:  [53-84] 53 (06/19 1431) Resp:  [16-18] 18 (06/19 1431) BP: (105-132)/(60-71) 108/60 (06/19 1431) SpO2:  [80 %-98 %] 98 % (06/19 1431)  Intake/Output from previous day: 06/18 0701 - 06/19 0700 In: 5169.6 [P.O.:180; I.V.:3889.6; IV Piggyback:1100] Out: 1015 [Urine:660; Drains:255; Blood:100] Intake/Output this shift: Total I/O In: 240 [P.O.:240] Out: 1005 [Urine:1000; Drains:5]   Lab Results:  Recent Labs  06/28/16 1554 06/29/16 0806  HGB 18.7* 17.3*  HCT 55.3* 51.7     Studies/Results: Dg Chest 2 View  Result Date: 06/29/2016 CLINICAL DATA:  Oxygen desaturation.  History of prostate cancer. EXAM: CHEST  2 VIEW COMPARISON:  No recent . FINDINGS: Mediastinum hilar structures normal. Cardiomegaly with normal pulmonary vascularity. Low lung volumes with bibasilar atelectasis. Small pleural effusions. IMPRESSION: 1. Low lung volumes with bibasilar atelectasis. Small pleural effusions. 2. Cardiomegaly with normal pulmonary vascularity. Electronically Signed   By: Marcello Moores  Register   On: 06/29/2016 14:21    Assessment/Plan: POD # 1 s/p radical prostatectomy for high risk prostate cancer 1) Hypoxia: He was hypoxic at baseline (90-92% on RA noted incidentally pre-op) but worse today.  Pt is being diuresed after review of CXR.  Greatly appreciate pulmonary input and assistance.  2) Post-op: He is making progress and should be ready for discharge once his pulmonary issues are stabilized. Pathology pending.   LOS: 0 days   Amar Keenum,LES 06/29/2016, 5:25 PM

## 2016-06-30 ENCOUNTER — Observation Stay (HOSPITAL_COMMUNITY): Payer: BLUE CROSS/BLUE SHIELD

## 2016-06-30 DIAGNOSIS — J9622 Acute and chronic respiratory failure with hypercapnia: Secondary | ICD-10-CM | POA: Diagnosis not present

## 2016-06-30 DIAGNOSIS — J9811 Atelectasis: Secondary | ICD-10-CM | POA: Diagnosis not present

## 2016-06-30 DIAGNOSIS — I10 Essential (primary) hypertension: Secondary | ICD-10-CM | POA: Diagnosis present

## 2016-06-30 DIAGNOSIS — R0902 Hypoxemia: Secondary | ICD-10-CM

## 2016-06-30 DIAGNOSIS — J9621 Acute and chronic respiratory failure with hypoxia: Secondary | ICD-10-CM | POA: Diagnosis not present

## 2016-06-30 DIAGNOSIS — N4889 Other specified disorders of penis: Secondary | ICD-10-CM | POA: Diagnosis present

## 2016-06-30 DIAGNOSIS — F17200 Nicotine dependence, unspecified, uncomplicated: Secondary | ICD-10-CM | POA: Diagnosis not present

## 2016-06-30 DIAGNOSIS — F17210 Nicotine dependence, cigarettes, uncomplicated: Secondary | ICD-10-CM | POA: Diagnosis present

## 2016-06-30 DIAGNOSIS — Z79899 Other long term (current) drug therapy: Secondary | ICD-10-CM | POA: Diagnosis not present

## 2016-06-30 DIAGNOSIS — C61 Malignant neoplasm of prostate: Secondary | ICD-10-CM | POA: Diagnosis present

## 2016-06-30 DIAGNOSIS — E7800 Pure hypercholesterolemia, unspecified: Secondary | ICD-10-CM | POA: Diagnosis present

## 2016-06-30 LAB — BASIC METABOLIC PANEL
ANION GAP: 6 (ref 5–15)
BUN: 7 mg/dL (ref 6–20)
CALCIUM: 8.6 mg/dL — AB (ref 8.9–10.3)
CO2: 34 mmol/L — ABNORMAL HIGH (ref 22–32)
CREATININE: 0.61 mg/dL (ref 0.61–1.24)
Chloride: 93 mmol/L — ABNORMAL LOW (ref 101–111)
Glucose, Bld: 118 mg/dL — ABNORMAL HIGH (ref 65–99)
Potassium: 4.6 mmol/L (ref 3.5–5.1)
SODIUM: 133 mmol/L — AB (ref 135–145)

## 2016-06-30 LAB — CBC WITH DIFFERENTIAL/PLATELET
BASOS PCT: 0 %
Basophils Absolute: 0 10*3/uL (ref 0.0–0.1)
EOS ABS: 0 10*3/uL (ref 0.0–0.7)
EOS PCT: 0 %
HCT: 51.2 % (ref 39.0–52.0)
HEMOGLOBIN: 16.8 g/dL (ref 13.0–17.0)
Lymphocytes Relative: 8 %
Lymphs Abs: 1.1 10*3/uL (ref 0.7–4.0)
MCH: 33.1 pg (ref 26.0–34.0)
MCHC: 32.8 g/dL (ref 30.0–36.0)
MCV: 101 fL — ABNORMAL HIGH (ref 78.0–100.0)
Monocytes Absolute: 2 10*3/uL — ABNORMAL HIGH (ref 0.1–1.0)
Monocytes Relative: 15 %
Neutro Abs: 10.3 10*3/uL — ABNORMAL HIGH (ref 1.7–7.7)
Neutrophils Relative %: 77 %
PLATELETS: 157 10*3/uL (ref 150–400)
RBC: 5.07 MIL/uL (ref 4.22–5.81)
RDW: 13 % (ref 11.5–15.5)
WBC: 13.5 10*3/uL — AB (ref 4.0–10.5)

## 2016-06-30 LAB — MAGNESIUM: Magnesium: 1.5 mg/dL — ABNORMAL LOW (ref 1.7–2.4)

## 2016-06-30 MED ORDER — FUROSEMIDE 10 MG/ML IJ SOLN
20.0000 mg | Freq: Once | INTRAMUSCULAR | Status: AC
  Administered 2016-06-30: 20 mg via INTRAVENOUS
  Filled 2016-06-30: qty 2

## 2016-06-30 NOTE — Discharge Summary (Signed)
Date of admission: 06/28/2016  Date of discharge: 07/01/2016  Admission diagnosis: Prostate Cancer  Discharge diagnosis: Prostate Cancer  History and Physical: For full details, please see admission history and physical. Briefly, Robert [name] is a 61 y.o. gentleman with localized prostate cancer.  After discussing management/treatment options, he elected to proceed with surgical treatment.  Hospital Course: Robert [name] was taken to the operating room on 06/28/2016 and underwent a robotic assisted laparoscopic radical prostatectomy. Preoperatively his O2 sat was noted to be low at 90-92% on RA.  He received a breathing treatment and sat responded appropriately.  He tolerated this procedure well and without complications. Postoperatively, he was able to be transferred to a regular hospital room following recovery from anesthesia.  He was able to begin ambulating the night of surgery. He remained hemodynamically stable overnight.  He had excellent urine output with appropriately minimal output from his pelvic drain and his pelvic drain was removed on POD #1.  He was transitioned to oral pain medication and tolerated a clear liquid diet.    Post operatively he had to be maintained on 2L O2 via [place] due to his oxygen saturation dropping to 80% on RA. Sats remained between 90-94% on O2.  Multiple efforts to wean O2 were unsuccessful, therefore, a CXR and pulmonary consult were obtained.  CXR on POD 1 showed atelectasis and small bilateral pleural effusions. His I/O balance was positive for almost 3L.  He received a dose of lasix and diuresed well.  Pt used the incentive spirometer as instructed.  Hypoxia was felt to be due to atelectasis and volume overload in a pt with a long smoking history.  Repeat CXR on POD 2 showed improvement.  He continued to desat off O2 and therefore home oxygen was set up for the pt.  He was started on Breo and prn Albuterol as well.  He had met all other discharge criteria and was  able to be discharged home on POD#3.  Laboratory values:  Recent Labs  06/29/16 0806 06/30/16 0739 07/01/16 0635  HGB 17.3* 16.8 16.8  HCT 51.7 51.2 49.4    Disposition: Home  Discharge instruction: He was instructed to be ambulatory but to refrain from heavy lifting, strenuous activity, or driving. He was instructed on urethral catheter care.  Discharge medications:  Allergies as of 07/01/2016      Reactions   Codeine    REACTION: Itching   Isovue [iopamidol] Hives   Pt. Developed 1 hive after injection; Dr. Kris Hartmann spoke with patient;recommends 13 hr prep in the future      Medication List    STOP taking these medications   aspirin EC 81 MG tablet   ibuprofen 200 MG tablet Commonly known as:  ADVIL,MOTRIN   tamsulosin 0.4 MG Caps capsule Commonly known as:  FLOMAX   Vitamin D3 50000 units Tabs     TAKE these medications   albuterol (2.5 MG/3ML) 0.083% nebulizer solution Commonly known as:  PROVENTIL Take 3 mLs (2.5 mg total) by nebulization every 6 (six) hours as needed for wheezing or shortness of breath.   benazepril-hydrochlorthiazide 20-25 MG tablet Commonly known as:  LOTENSIN HCT TAKE ONE TABLET BY MOUTH DAILY FOR BLOOD PRESSURE   bisoprolol-hydrochlorothiazide 10-6.25 MG tablet Commonly known as:  ZIAC TAKE ONE TABLET BY MOUTH DAILY   fluticasone furoate-vilanterol 100-25 MCG/INH Aepb Commonly known as:  BREO ELLIPTA Inhale 1 puff into the lungs daily.   HYDROcodone-acetaminophen 5-325 MG tablet Commonly known as:  NORCO Take  1-2 tablets by mouth every 6 (six) hours as needed for moderate pain or severe pain.   Magnesium 500 MG Tabs Take 500 mg by mouth daily.   sulfamethoxazole-trimethoprim 800-160 MG tablet Commonly known as:  BACTRIM DS,SEPTRA DS Take 1 tablet by mouth 2 (two) times daily. Start the day prior to foley removal appointment            Durable Medical Equipment        Start     Ordered   06/30/16 1750  For home use only  DME oxygen  Once    Question Answer Comment  Mode or (Route) Nasal cannula   Liters per Minute 2   Frequency Continuous (stationary and portable oxygen unit needed)   Oxygen conserving device Yes   Oxygen delivery system Gas      06/30/16 1750      Followup: He will followup in 1 week for catheter removal and to discuss his surgical pathology results. He will also f/u with pulmonary as an outpt.

## 2016-06-30 NOTE — Progress Notes (Signed)
[facility]  Physician Requesting Consult:  Raynelle Bring, M.D. / Urology   Date of Consult:  06/29/2016  Reason for Consult/Chief Complaint:  Acute Hypoxic Respiratory Failure  History of Presenting Illness:  61 y.o. male with a long-standing history of tobacco use. Patient presented for robotic-assisted laparoscopic radical prostatectomy yesterday. Per documentation patient was noted to be saturating 80% at rest before ambulation today. Patient during ambulation was saturating 80% on room air as well. Patient required oxygen at 2 L/m to improve saturation to 94%. Per documentation patient is approximately 3.7 L positive since admission. Lasix has already been ordered for administration. Patient denies any chest pain, pressure, tightness, or palpitations. Reports his baseline nonproductive cough but has noticed some mild wheezing. Denies any dyspnea with attempted ambulation today. No subjective fever, chills, or sweats. Does have some abdominal discomfort but no nausea or vomiting. Denies any near syncope or vertigo. Patient reports as a child he did have some breathing problems. He also notably has some mild seasonal allergic rhinitis. Denies any history of chronic bronchitis but does receive treatment approximately once yearly for bronchitis. No history of pneumonia. No history of hypoxia. No history of any other breathing problems or chronic inhaled medication use. Patient has had no prior history of hospitalization for breathing problems. He reports he did receive sedation with a previous colonoscopy without adverse effect but has never been endotracheally intubated otherwise.   Temp:  [98.2 F (36.8 C)-98.8 F (37.1 C)] 98.8 F (37.1 C) (06/20 0430) Pulse Rate:  [53-65] 63 (06/20 0430) Resp:  [18-20] 20 (06/20 0430) BP: (108-128)/(54-62) 108/54 (06/20 0430) SpO2:  [90 %-98 %] 91 % (06/20 1016) Weight:  [239 lb 9.6 oz (108.7 kg)] 239 lb 9.6 oz (108.7 kg) (06/20  0430) Room air  General appearance:  61 Year old  Male, well nourished NAD, conversant  Eyes: anicteric sclerae, moist conjunctivae; PERRL, EOMI bilaterally. Mouth:  membranes and no mucosal ulcerations; Neck: Trachea midline; neck supple, no JVD Lungs/chest: basilar posterior rales, with normal respiratory effort and no intercostal retractions CV: RRR, no MRGs  Abdomen: Soft, tender w/cough no masses or HSM Extremities: No peripheral edema or extremity lymphadenopathy Skin: Normal temperature, turgor and texture; no rash, ulcers or subcutaneous nodules Psych: Appropriate affect, alert and oriented to person, place and time   CBC Latest Ref Rng & Units 06/30/2016 06/29/2016 06/28/2016  WBC 4.0 - 10.5 K/uL 13.5(H) - -  Hemoglobin 13.0 - 17.0 g/dL 16.8 17.3(H) 18.7(H)  Hematocrit 39.0 - 52.0 % 51.2 51.7 55.3(H)  Platelets 150 - 400 K/uL 157 - -   BMP Latest Ref Rng & Units 06/30/2016 06/29/2016 06/23/2016  Glucose 65 - 99 mg/dL 118(H) 153(H) 83  BUN 6 - 20 mg/dL 7 9 8   Creatinine 0.61 - 1.24 mg/dL 0.61 0.80 0.68  Sodium 135 - 145 mmol/L 133(L) 132(L) 133(L)  Potassium 3.5 - 5.1 mmol/L 4.6 4.8 4.7  Chloride 101 - 111 mmol/L 93(L) 94(L) 92(L)  CO2 22 - 32 mmol/L 34(H) 33(H) 33(H)  Calcium 8.9 - 10.3 mg/dL 8.6(L) 8.4(L) 9.1    IMAGING/STUDIES: CXR PA/LAT 06/29/16 (personally reviewed by me):  No focal opacity appreciated. Linear opacification in bilateral lung zones with low lung volumes suggestive of atelectasis. No pleural effusion. Borderline cardiomegaly. 20th pcxr personally reviewed. Favor basilar atx MICROBIOLOGY: None.  ANTIBIOTICS: Ancef 6/18 - 6/19 (periop prophylaxis)  ASSESSMENT/PLAN: status post robotic prostatectomy  postoperative hypoxia  & Acute hypoxic respiratory failure Atelectasis  +/- pulmonary edema (doubt)  Tobacco use disorder cravings.  Discussion  Favor atelectasis. Clinically better.   Plan Push pulm hygiene  [place]  ACNP-BC [place] Pager # [phone] OR # [phone] if no answer  10:18 AM 06/30/16

## 2016-06-30 NOTE — Progress Notes (Signed)
Patient ID: Robert [name], male   DOB: [birth year], 61 y.o.   MRN: [id number]  2 Days Post-Op Subjective: Patient without dyspnea.  No other complaints.  He is ambulating well.  Received furosemide last evening with almost 2 L negative over last 24 hrs.  Continuing to receive nebulizers.  Objective: Vital signs in last 24 hours: Temp:  [98.2 F (36.8 C)-98.8 F (37.1 C)] 98.8 F (37.1 C) (06/20 0430) Pulse Rate:  [53-65] 63 (06/20 0430) Resp:  [18-20] 20 (06/20 0430) BP: (108-128)/(54-62) 108/54 (06/20 0430) SpO2:  [80 %-98 %] 95 % (06/20 0430) Weight:  [108.7 kg (239 lb 9.6 oz)] 108.7 kg (239 lb 9.6 oz) (06/20 0430)  Intake/Output from previous day: 06/19 0701 - 06/20 0700 In: 480 [P.O.:480] Out: 2455 [Urine:2450; Drains:5] Intake/Output this shift: No intake/output data recorded.  Physical Exam:  General: Alert and oriented CV: RRR Lungs: Bilateral inspiratory wheezes Abdomen: Soft, ND Incisions: C/D/I Ext: NT, No erythema  Lab Results:  Recent Labs  06/28/16 1554 06/29/16 0806  HGB 18.7* 17.3*  HCT 55.3* 51.7   BMET  Recent Labs  06/29/16 1653  NA 132*  K 4.8  CL 94*  CO2 33*  GLUCOSE 153*  BUN 9  CREATININE 0.80  CALCIUM 8.4*     Studies/Results:   Assessment/Plan: POD # 2 s/p robotic prostatectomy  1) Hypoxia:  Will check pulse oximetry on room air and will continue to wean oxygen.  Patient with baseline low oxygen saturation of 90-92% on room air noted incidentally upon pre-operative evaluation. Will check repeat chest x-ray this morning.  ? If patient may need further diuresis. 2) Post-op: He is otherwise doing well.  Discharge will be pending pulmonary status.   LOS: 0 days   Seymore Brodowski,LES 06/30/2016, 7:11 AM

## 2016-06-30 NOTE — Progress Notes (Signed)
Patient ambulated in room. O2 initially 93% on RA. With activity O2 dropped to 86%.  O2 reapplied at 2L. Bret Vanessen A

## 2016-06-30 NOTE — Care Management Note (Signed)
Case Management Note  Patient Details  Name: LINAS STEPTER MRN: [id number] Date of Birth: 02-15-1955  Subjective/Objective:  Noted 02 sats does not qualify for home 02. Pulmonary following. No CM needs.                  Action/Plan:d/c home.   Expected Discharge Date:                  Expected Discharge Plan:  Home/Self Care  In-House Referral:     Discharge planning Services  CM Consult  Post Acute Care Choice:    Choice offered to:     DME Arranged:    DME Agency:     HH Arranged:    HH Agency:     Status of Service:  In process, will continue to follow  If discussed at Long Length of Stay Meetings, dates discussed:    Additional Comments:  Dessa Phi, RN 06/30/2016, 11:58 AM

## 2016-06-30 NOTE — Progress Notes (Signed)
Patient ID: Robert [name], male   DOB: 06/29/1955, 61 y.o.   MRN: [id number]   Pt was scheduled to be discharged after evaluation by pulmonology this morning.  However, his oxygen saturation was subsequently noted to be 78% of RA and his discharge was held.  Will proceed with additional gentle diuresis this afternoon.  Continue ambulation and aggressive pulmonary toilet/incentive spirometry.  CXR improved today with resolution of effusions.  I spoke with Dr. Halford Chessman today and pulmonology will see him tomorrow morning for final disposition and to determine whether he may need home oxygen.  Otherwise, progressing well from a postoperative standpoint.  Pathology is pending.

## 2016-06-30 NOTE — Progress Notes (Signed)
Chaplain stopped in to visit with patient.  Patient talks about getting go home today maybe if all continues to go well.  Chaplain provided ministry of presence and encouragement.  Patient welcoming Chaplain presence.    06/30/16 1204  Clinical Encounter Type  Visited With Patient  Visit Type Initial;Social support  Referral From Chaplain  Consult/Referral To Chaplain

## 2016-06-30 NOTE — Progress Notes (Signed)
Checked oxygen saturation at rest on RA and was 79%. Ambulated in the hall and oxygen saturation remained in the mid 80s on RA. After returning to the room and sitting up in the chair, oxygen saturation was 85%. Oxygen was reapplied at 2 L/M [place]. Will continue to monitor.

## 2016-06-30 NOTE — Progress Notes (Signed)
At rest prior to ambulating, patient's oxygen saturation was 95% on RA. Ambulated patient the entire length and back without oxygen.  Patient's oxygen saturation dropped briefly to 89% but stayed between 90-93% the majority of the time. Keeping patient off of oxygen at this time and will spot check periodically.  Will continue to monitor.

## 2016-07-01 ENCOUNTER — Encounter: Payer: Self-pay | Admitting: Medical Oncology

## 2016-07-01 ENCOUNTER — Inpatient Hospital Stay (HOSPITAL_COMMUNITY): Payer: BLUE CROSS/BLUE SHIELD

## 2016-07-01 DIAGNOSIS — J9621 Acute and chronic respiratory failure with hypoxia: Secondary | ICD-10-CM

## 2016-07-01 DIAGNOSIS — J9622 Acute and chronic respiratory failure with hypercapnia: Secondary | ICD-10-CM

## 2016-07-01 LAB — BLOOD GAS, ARTERIAL
Acid-Base Excess: 9.2 mmol/L — ABNORMAL HIGH (ref 0.0–2.0)
BICARBONATE: 34.9 mmol/L — AB (ref 20.0–28.0)
Drawn by: 225631
FIO2: 21
O2 Saturation: 90.2 %
PH ART: 7.45 (ref 7.350–7.450)
PO2 ART: 54.6 mmHg — AB (ref 83.0–108.0)
Patient temperature: 37
pCO2 arterial: 51.1 mmHg — ABNORMAL HIGH (ref 32.0–48.0)

## 2016-07-01 LAB — CBC
HCT: 49.4 % (ref 39.0–52.0)
HEMOGLOBIN: 16.8 g/dL (ref 13.0–17.0)
MCH: 33 pg (ref 26.0–34.0)
MCHC: 34 g/dL (ref 30.0–36.0)
MCV: 97.1 fL (ref 78.0–100.0)
Platelets: 151 10*3/uL (ref 150–400)
RBC: 5.09 MIL/uL (ref 4.22–5.81)
RDW: 12.5 % (ref 11.5–15.5)
WBC: 15.7 10*3/uL — ABNORMAL HIGH (ref 4.0–10.5)

## 2016-07-01 LAB — BASIC METABOLIC PANEL
Anion gap: 10 (ref 5–15)
BUN: 11 mg/dL (ref 6–20)
CHLORIDE: 89 mmol/L — AB (ref 101–111)
CO2: 33 mmol/L — ABNORMAL HIGH (ref 22–32)
Calcium: 8.6 mg/dL — ABNORMAL LOW (ref 8.9–10.3)
Creatinine, Ser: 0.74 mg/dL (ref 0.61–1.24)
GFR calc Af Amer: >60 mL/min (ref >60)
GFR calc non Af Amer: >60 mL/min (ref >60)
Glucose, Bld: 117 mg/dL — ABNORMAL HIGH (ref 65–99)
POTASSIUM: 3.9 mmol/L (ref 3.5–5.1)
Sodium: 132 mmol/L — ABNORMAL LOW (ref 135–145)

## 2016-07-01 MED ORDER — FLUTICASONE FUROATE-VILANTEROL 100-25 MCG/INH IN AEPB
1.0000 | INHALATION_SPRAY | Freq: Every day | RESPIRATORY_TRACT | Status: DC
  Administered 2016-07-01: 12:00:00 1 via RESPIRATORY_TRACT
  Filled 2016-07-01: qty 28

## 2016-07-01 MED ORDER — ALBUTEROL SULFATE (2.5 MG/3ML) 0.083% IN NEBU: 2.5000 mg | INHALATION_SOLUTION | Freq: Four times a day (QID) | RESPIRATORY_TRACT | 0 refills | Status: DC | PRN

## 2016-07-01 MED ORDER — ALBUTEROL SULFATE (2.5 MG/3ML) 0.083% IN NEBU: 2.5000 mg | INHALATION_SOLUTION | Freq: Four times a day (QID) | RESPIRATORY_TRACT | Status: DC | PRN

## 2016-07-01 MED ORDER — FLUTICASONE FUROATE-VILANTEROL 100-25 MCG/INH IN AEPB: 1.0000 | INHALATION_SPRAY | Freq: Every day | RESPIRATORY_TRACT | 0 refills | Status: DC

## 2016-07-01 NOTE — Progress Notes (Addendum)
PCCM Progress Note  Admission date: 06/28/2016 Consult date: 06/29/2016 Referring provider: Dr. Alinda Money, Urology  CC: Hypoxia  HPI: 61 yo male Robert [name] admitted for robot assisted radical prostatectomy.  After surgery he was noted have exertional hypoxia.  He has baseline intermittent productive cough and wheezing.  Subjective: Still has cough.  Denies chest pain.  SpO2 on room air with exertion yesterday in 70's and 80's.  Vital signs: BP 127/78 (BP Location: Right Arm)   Pulse 67   Temp 99.4 F (37.4 C) (Oral)   Resp 18   Ht 5\' 7"  (1.702 m)   Wt 233 lb 11.2 oz (106 kg)   SpO2 98%   BMI 36.60 kg/m   Intake/output: I/O last 3 completed shifts: In: 100 [Other:100] Out: 3450 [Urine:3450]  General: sitting in chair Neuro: alert, normal strength HEENT: no stridor Cardiac: regular Chest: no wheeze Abd: soft, non tender Ext: no edema Skin: no rashes   CMP Latest Ref Rng & Units 07/01/2016 06/30/2016 06/29/2016  Glucose 65 - 99 mg/dL 117(H) 118(H) 153(H)  BUN 6 - 20 mg/dL 11 7 9   Creatinine 0.61 - 1.24 mg/dL 0.74 0.61 0.80  Sodium 135 - 145 mmol/L 132(L) 133(L) 132(L)  Potassium 3.5 - 5.1 mmol/L 3.9 4.6 4.8  Chloride 101 - 111 mmol/L 89(L) 93(L) Robert(L)  CO2 22 - 32 mmol/L 33(H) 34(H) 33(H)  Calcium 8.9 - 10.3 mg/dL 8.6(L) 8.6(L) 8.4(L)  Total Protein 6.1 - 8.1 g/dL - - -  Total Bilirubin 0.2 - 1.2 mg/dL - - -  Alkaline Phos 40 - 115 U/L - - -  AST 10 - 35 U/L - - -  ALT 9 - 46 U/L - - -     CBC Latest Ref Rng & Units 07/01/2016 06/30/2016 06/29/2016  WBC 4.0 - 10.5 K/uL 15.7(H) 13.5(H) -  Hemoglobin 13.0 - 17.0 g/dL 16.8 16.8 17.3(H)  Hematocrit 39.0 - 52.0 % 49.4 51.2 51.7  Platelets 150 - 400 K/uL 151 157 -     ABG    Component Value Date/Time   PHART 7.450 07/01/2016 0445   PCO2ART 51.1 (H) 07/01/2016 0445   PO2ART 54.6 (L) 07/01/2016 0445   HCO3 34.9 (H) 07/01/2016 0445   O2SAT 90.2 07/01/2016 0445     Imaging: Dg Chest 2 View  Result Date:  07/01/2016 CLINICAL DATA:  Hypoxia and shortness of breath.  Atelectasis. EXAM: CHEST  2 VIEW COMPARISON:  06/30/2016 FINDINGS: Cardiomegaly again noted. Mild bibasilar subsegmental atelectasis noted, slightly increased on the right and slightly decreased on the left. There is no evidence of airspace disease, pleural effusion or pneumothorax. No acute bony abnormalities are present. IMPRESSION: Mild bibasilar subsegmental atelectasis, slightly increased on the right and slightly decreased on the left. Electronically Signed   By: Margarette Canada M.D.   On: 07/01/2016 08:42   Dg Chest 2 View  Result Date: 06/30/2016 CLINICAL DATA:  Hypoxia, shortness of breath, on supplemental oxygen, recently discontinued smoking. History of childhood asthma, prostate malignancy. EXAM: CHEST  2 VIEW COMPARISON:  Chest x-ray of June 29, 2016 FINDINGS: The lungs are adequately inflated. The lung markings are coarse in the mid and lower lung zones. The heart is mildly enlarged. The pulmonary vascularity is normal. The mediastinum is normal in width. The trachea is midline. There is no significant pleural effusion. There is calcification of the anterior longitudinal ligament of the thoracic spine. IMPRESSION: Persistent bibasilar atelectasis. No discrete pneumonia. Decreased or resolved small bilateral pleural effusions. Mild cardiomegaly without pulmonary edema.  Electronically Signed   By: David  Martinique M.D.   On: 06/30/2016 09:04   Dg Chest 2 View  Result Date: 06/29/2016 CLINICAL DATA:  Oxygen desaturation.  History of prostate cancer. EXAM: CHEST  2 VIEW COMPARISON:  No recent . FINDINGS: Mediastinum hilar structures normal. Cardiomegaly with normal pulmonary vascularity. Low lung volumes with bibasilar atelectasis. Small pleural effusions. IMPRESSION: 1. Low lung volumes with bibasilar atelectasis. Small pleural effusions. 2. Cardiomegaly with normal pulmonary vascularity. Electronically Signed   By: Marcello Moores  Register   On:  06/29/2016 14:21    Summary: 61 yo male Robert [name] with acute on chronic hypoxic/hypercapnic respiratory failure.  Some of this is related to post op atelectasis.  I suspect he also has underlying obstructive lung disease and possible sleep disordered breathing.  Assessment/plan:  Acute on chronic respiratory failure. - will need to arrange for home oxygen at 2 liters with exertion - spirometry pending >> if this is delayed and he is otherwise ready to go home, then can arrange for pulmonary function testing as outpt - add Breo and prn albuterol - bronchial hygiene - assess as outpt for sleep study   I have scheduled him for full pulmonary function test on July 19 at 9 AM, and then follow up with Eric Form at 10:30 AM.  Time spent 37 minutes.  Chesley Mires, MD [facility] Pulmonary/Critical Care 07/01/2016, 9:59 AM Pager:  [phone] After 3pm call: [phone]

## 2016-07-01 NOTE — Progress Notes (Signed)
SATURATION QUALIFICATIONS: (This note is used to comply with regulatory documentation for home oxygen)  Patient Saturations on Room Air at Rest = 87%  Robert [name], [name]

## 2016-07-01 NOTE — Progress Notes (Signed)
Pts. scheduled ABG for 0500 was drawn on room air after being off 2 lpm n/c Oxygen for ~45 minutes, replaced 2 lpm n/c Oxygen after ABG drawn, no complications, sats per pulse oximeter dropped to 86% prior to arterial blood drawl, RN made aware with results placed in EPIC.

## 2016-07-01 NOTE — Progress Notes (Signed)
SATURATION QUALIFICATIONS: (This note is used to comply with regulatory documentation for home oxygen)  Patient Saturations on Room Air at Rest = 87%  Pt placed on oxygen via nasal cannula and sats increased to 96% on 2l. Stacey Drain

## 2016-07-01 NOTE — Care Management Note (Signed)
Case Management Note  Patient Details  Name: [name] MRN: [id number] Date of Birth: Sep 09, 1955  Subjective/Objective:Qualifies for home 02-home 02 ordered-[facility] dme rep Maudie Mercury aware of possible d/c, & order for home 02-will deliver to rm prior d/c-Nsg notified.                    Action/Plan:d/c home w/DME.   Expected Discharge Date:                  Expected Discharge Plan:  Home/Self Care  In-House Referral:     Discharge planning Services  CM Consult  Post Acute Care Choice:    Choice offered to:  Patient  DME Arranged:  Oxygen DME Agency:  [place]:    [place] Agency:     Status of Service:  Completed, signed off  If discussed at [place] of Stay Meetings, dates discussed:    Additional Comments:  Dessa Phi, RN 07/01/2016, 10:23 AM

## 2016-07-01 NOTE — Progress Notes (Signed)
Patient ID: Robert [name], male   DOB: 03-Jul-1955, 61 y.o.   MRN: [id number]  3 Days Post-Op Subjective: Pt without complaints overnight.  Specifically denies dyspnea.  Passing flatus.  Low grade temperature yesterday afternoon but now improved.  Objective: Vital signs in last 24 hours: Temp:  [99 F (37.2 C)-100.4 F (38 C)] 99.4 F (37.4 C) (06/21 0603) Pulse Rate:  [64-67] 67 (06/21 0603) Resp:  [12-18] 18 (06/21 0603) BP: (127-136)/(48-78) 127/78 (06/21 0603) SpO2:  [90 %-98 %] 98 % (06/21 0603) Weight:  [106 kg (233 lb 11.2 oz)] 106 kg (233 lb 11.2 oz) (06/21 0603)  Intake/Output from previous day: 06/20 0701 - 06/21 0700 In: 100  Out: 2300 [Urine:2300] Intake/Output this shift: No intake/output data recorded.  Physical Exam:  General: Alert and oriented CV: RRR Lungs: Bilateral inspiratory wheezes Abdomen: Soft, ND Incisions: C/D/i Ext: NT, No erythema  Lab Results:  Recent Labs  06/29/16 0806 06/30/16 0739 07/01/16 0635  HGB 17.3* 16.8 16.8  HCT 51.7 51.2 49.4   . CBC . CBC Latest Ref Rng & Units 07/01/2016 06/30/2016 06/29/2016  WBC 4.0 - 10.5 K/uL 15.7(H) 13.5(H) -  Hemoglobin 13.0 - 17.0 g/dL 16.8 16.8 17.3(H)  Hematocrit 39.0 - 52.0 % 49.4 51.2 51.7  Platelets 150 - 400 K/uL 151 157 -     BMET  Recent Labs  06/29/16 1653 06/30/16 0739  NA 132* 133*  K 4.8 4.6  CL 94* 93*  CO2 33* 34*  GLUCOSE 153* 118*  BUN 9 7  CREATININE 0.80 0.61  CALCIUM 8.4* 8.6*     Studies/Results: Dg Chest 2 View  Result Date: 06/30/2016 CLINICAL DATA:  Hypoxia, shortness of breath, on supplemental oxygen, recently discontinued smoking. History of childhood asthma, prostate malignancy. EXAM: CHEST  2 VIEW COMPARISON:  Chest x-ray of June 29, 2016 FINDINGS: The lungs are adequately inflated. The lung markings are coarse in the mid and lower lung zones. The heart is mildly enlarged. The pulmonary vascularity is normal. The mediastinum is normal in width. The  trachea is midline. There is no significant pleural effusion. There is calcification of the anterior longitudinal ligament of the thoracic spine. IMPRESSION: Persistent bibasilar atelectasis. No discrete pneumonia. Decreased or resolved small bilateral pleural effusions. Mild cardiomegaly without pulmonary edema. Electronically Signed   By: David  Martinique M.D.   On: 06/30/2016 09:04   Dg Chest 2 View  Result Date: 06/29/2016 CLINICAL DATA:  Oxygen desaturation.  History of prostate cancer. EXAM: CHEST  2 VIEW COMPARISON:  No recent . FINDINGS: Mediastinum hilar structures normal. Cardiomegaly with normal pulmonary vascularity. Low lung volumes with bibasilar atelectasis. Small pleural effusions. IMPRESSION: 1. Low lung volumes with bibasilar atelectasis. Small pleural effusions. 2. Cardiomegaly with normal pulmonary vascularity. Electronically Signed   By: Marcello Moores  Register   On: 06/29/2016 14:21    Assessment/Plan: POD # 3 s/p robotic prostatectomy for prostate cancer 1) Hypoxia: Oxygen saturation on RA per blood gas this morning was 90%.  Suspect much of his hypoxia is chronic related to tobacco use/COPD with postoperative atelectasis.  Pulmonology to assess him this morning with recommendations for discharge.  Tentative order was placed last night for home oxygen (to expedite disposition today) but can certainly cancel order if not necessary. 2) Low grade fever/leukocytosis: Fever is resolved without intervention (probably related to atelectasis). Uncertain of leukocytosis but not appearing to be clinically significant from postoperative standpoint.  3) Disposition: Dependent of pulmonology recommendations.  Suspect patient will be able to be  discharged later this morning.   LOS: 1 day   Riane Rung,LES 07/01/2016, 7:11 AM

## 2016-07-01 NOTE — Progress Notes (Signed)
Pt dc'd to home with home oxygen and foley catheter in place. Pt verbalized and demonstrated how to care for catheter at home as well as use of leg bag.  RN reviewed dc instructions and answered questions that pt had in regards to care of catheter at home. Stacey Drain

## 2016-07-01 NOTE — Progress Notes (Signed)
Robert [name] post robotic prostatectomy 06/28/16. Surgery went well but post op has had shortness of breath requiring oxygen. He states he was a smoker before surgery.  He will be discharged home with oxygen. We discussed how he should refrain from smoking to help his lungs recover and the dangers of smoking with oxygen in the home. He voiced understanding. We discussed the importance of deep breathing and walking after discharge. He will follow up with Dr. Alinda Money next week for catheter removal.

## 2016-07-07 ENCOUNTER — Telehealth: Payer: Self-pay | Admitting: *Deleted

## 2016-07-07 MED ORDER — BUPROPION HCL ER (XL) 300 MG PO TB24: 300.0000 mg | ORAL_TABLET | Freq: Every day | ORAL | 1 refills | Status: DC

## 2016-07-07 NOTE — Telephone Encounter (Signed)
Patent called and asked if he can restart his Wellbutrin and also, he requested the name of the best OTC smoking cessation patch.  Per Dr Melford Aase, the patches are all about the same and generic brand is OK. Per Dr Melford Aase, an RX for Wellbutrin 300 mg has been sent to the patient's pharmacy.

## 2016-07-09 ENCOUNTER — Emergency Department (HOSPITAL_COMMUNITY): Payer: BLUE CROSS/BLUE SHIELD

## 2016-07-09 ENCOUNTER — Emergency Department (HOSPITAL_COMMUNITY)
Admission: EM | Admit: 2016-07-09 | Discharge: 2016-07-09 | Disposition: A | Discharge status: Home / Self Care | Payer: BLUE CROSS/BLUE SHIELD | Attending: Emergency Medicine | Admitting: Emergency Medicine

## 2016-07-09 ENCOUNTER — Encounter (HOSPITAL_COMMUNITY): Payer: Self-pay | Admitting: *Deleted

## 2016-07-09 DIAGNOSIS — Y939 Activity, unspecified: Secondary | ICD-10-CM | POA: Diagnosis not present

## 2016-07-09 DIAGNOSIS — Z8546 Personal history of malignant neoplasm of prostate: Secondary | ICD-10-CM | POA: Insufficient documentation

## 2016-07-09 DIAGNOSIS — F17210 Nicotine dependence, cigarettes, uncomplicated: Secondary | ICD-10-CM | POA: Insufficient documentation

## 2016-07-09 DIAGNOSIS — I10 Essential (primary) hypertension: Secondary | ICD-10-CM | POA: Insufficient documentation

## 2016-07-09 DIAGNOSIS — Y929 Unspecified place or not applicable: Secondary | ICD-10-CM | POA: Diagnosis not present

## 2016-07-09 DIAGNOSIS — W5911XA Bitten by nonvenomous snake, initial encounter: Secondary | ICD-10-CM | POA: Diagnosis not present

## 2016-07-09 DIAGNOSIS — S61253A Open bite of left middle finger without damage to nail, initial encounter: Secondary | ICD-10-CM | POA: Insufficient documentation

## 2016-07-09 DIAGNOSIS — Y999 Unspecified external cause status: Secondary | ICD-10-CM | POA: Diagnosis not present

## 2016-07-09 MED ORDER — TETANUS-DIPHTH-ACELL PERTUSSIS 5-2.5-18.5 LF-MCG/0.5 IM SUSP
0.5000 mL | Freq: Once | INTRAMUSCULAR | Status: AC
  Administered 2016-07-09: 0.5 mL via INTRAMUSCULAR
  Filled 2016-07-09: qty 0.5

## 2016-07-09 NOTE — Discharge Instructions (Signed)
You were seen in the ED today with a snake bite to the finger. There is no swelling or redness to the finger. You should return to the ED if the finger suddenly becomes painful, swollen, red, or hot to touch as this could be a sign of developing infection or a more serious bite.   Take Tylenol as needed for pain and keep the wound clean and dry. Follow up with your PCP as needed.

## 2016-07-09 NOTE — ED Notes (Signed)
Pt is in stable condition upon d/c and ambulates from ED. 

## 2016-07-09 NOTE — ED Provider Notes (Signed)
Emergency Department Provider Note   I have reviewed the triage vital signs and the nursing notes.   HISTORY  Chief Complaint Snake Bite   HPI Robert [name] is a 61 y.o. male with PMH of HTN and HLD presents to the emergency department for evaluation after snakebite. The patient found a small snake in his trash can try and remove it when it bit him twice on the left middle finger. The patient had no pain during the bite. He looked at his hand and saw small amount of blood. He denies any swelling. They were able to move the snake outside and took several pictures. Patient states he was in a come to the emergency department but his wife made him come. The event occurred 2 hours prior to ED presentation and he has no swelling or pain in the finger.  Past Medical History:  Diagnosis Date  . 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 Elevated hemoglobin A1c   . Elevated PSA 03/16/2016  . Hyperlipidemia   . Hypertension   . Personal history of colonic adenomas 12/28/2007  . Pre-diabetes   . Prostate cancer ([place]) 03/16/2016  . Vitamin D deficiency     Patient Active Problem List   Diagnosis Date Noted  . Tobacco use disorder   . Prostate cancer ([place]) 06/28/2016  . Malignant neoplasm of prostate ([place]) 05/20/2016  . Medication management 07/03/2014  . Encounter for Jermone Geister-term (current) use of medications 12/18/2013  . Obesity (BMI 35.55) 03/19/2013  . Testosterone deficiency 12/05/2012  . Hypertension   . Hyperlipidemia   . Vitamin D deficiency   . Elevated hemoglobin A1c   . Personal history of colonic adenomas 12/28/2007    Past Surgical History:  Procedure Laterality Date  . COLONOSCOPY    . LYMPHADENECTOMY N/A 06/28/2016   Procedure: LYMPHADENECTOMY;  Surgeon: Raynelle Bring, MD;  Location: [facility];  Service: Urology;  Laterality: N/A;  . PROSTATE BIOPSY  03/16/2016   Surgical Pathology Gross and Microscopic Examination for Prostate Needle  . ROBOT ASSISTED LAPAROSCOPIC  RADICAL PROSTATECTOMY N/A 06/28/2016   Procedure: XI ROBOTIC ASSISTED LAPAROSCOPIC RADICAL PROSTATECTOMY LEVEL 2 EXCISION OF PENILE LESION;  Surgeon: Raynelle Bring, MD;  Location: [facility];  Service: Urology;  Laterality: N/A;    Current Outpatient Rx  . Order #: [id number] Class: Print  . Order #: [id number] Class: Normal  . Order #: [id number] Class: Normal  . Order #: [id number] Class: Normal  . Order #: [id number] Class: Print  . Order #: [id number] Class: Print  . Order #: [id number] Class: Historical Med  . Order #: [id number] Class: Print    Allergies Codeine and Isovue [iopamidol]  Family History  Problem Relation Age of Onset  . Cancer Mother        lung  . Lung disease Neg Hx     Social History Social History  Substance Use Topics  . Smoking status: Current Every Day Smoker    Packs/day: 1.50    Years: 40.00    Types: Cigarettes  . Smokeless tobacco: Never Used     Comment: Peak rate of 2ppd. Has never attempted to quit.   . Alcohol use 2.4 oz/week    4 Cans of beer per week    Review of Systems  Constitutional: No fever/chills Eyes: No visual changes. ENT: No sore throat. Cardiovascular: Denies chest pain. Respiratory: Denies shortness of breath. Gastrointestinal: No abdominal pain.  No nausea, no vomiting.  No diarrhea.  No constipation. Genitourinary: Negative for dysuria. Musculoskeletal: Negative for back pain. Skin: Positive snake  bite to the finger.  Neurological: Negative for headaches, focal weakness or numbness.  10-point ROS otherwise negative.  ____________________________________________   PHYSICAL EXAM:  VITAL SIGNS: ED Triage Vitals  Enc Vitals Group     BP 07/09/16 0839 136/79     Pulse Rate 07/09/16 0839 64     Resp 07/09/16 0839 18     Temp 07/09/16 0839 97.9 F (36.6 C)     Temp Source 07/09/16 0839 Oral     SpO2 07/09/16 0839 94 %     Pain Score 07/09/16 0841 0   Constitutional: Alert and oriented. Well appearing and in no acute  distress. Eyes: Conjunctivae are normal. Head: Atraumatic. Nose: No congestion/rhinnorhea. Mouth/Throat: Mucous membranes are moist.   Neck: No stridor Musculoskeletal: No lower extremity tenderness nor edema. No gross deformities of extremities. Neurologic:  Normal speech and language. No gross focal neurologic deficits are appreciated.  Skin:  Skin is warm, dry and intact. Two superficial abrasions to the left middle finger on the left hand. No surrounding edema or erythema. No drainage or bleeding. Full ROM of the left hand/fingers.  Psychiatric: Mood and affect are normal. Speech and behavior are normal.  ____________________________________________  RADIOLOGY  Dg Hand Complete Left  Result Date: 07/09/2016 CLINICAL DATA:  Snake bite left third digit EXAM: LEFT HAND - COMPLETE 3+ VIEW COMPARISON:  None. FINDINGS: Small radiopaque soft tissue foreign body noted along the left second MCP joint. Normal alignment without acute osseous finding, fracture, subluxation, or dislocation. No significant joint abnormality. No acute finding at the wrist. IMPRESSION: Small punctate subcentimeter radiopaque foreign body along the left second MCP joint. No other acute osseous finding. Electronically Signed   By: Jerilynn Mages.  Shick M.D.   On: 07/09/2016 10:38    ____________________________________________   PROCEDURES  Procedure(s) performed:   Procedures  None ____________________________________________   INITIAL IMPRESSION / ASSESSMENT AND PLAN / ED COURSE  Pertinent labs & imaging results that were available during my care of the patient were reviewed by me and considered in my medical decision making (see chart for details).  Patient resents to the emergency department for evaluation after snake bite. In review of the pictures the patient brought to the emergency department this does not appear to be a venomous snake. Even if it was, the patient has no pain or swelling in the finger. He has 2  superficial abrasions. The bite occurred 2 hours prior to ED presentation. Plan to obtain x-ray to rule out retained foreign body in the wound. Also will update tetanus. No indication for [place] at this time.   10:46 AM Patient with no interim pain or swelling in the finger. Full range of motion of the finger. X-ray shows a foreign body over the left second MCP. There is no wound in this area. 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 Plan for basic wound care at home and PCP follow up. Discussed return precautions in detail.   At this time, I do not feel there is any life-threatening condition present. I have reviewed and discussed all results (EKG, imaging, lab, urine as appropriate), exam findings with patient. I have reviewed nursing notes and appropriate previous records.  I feel the patient is safe to be discharged home without further emergent workup. Discussed usual and customary return precautions. Patient and family (if present) verbalize understanding and are comfortable with this plan.  Patient will follow-up with their primary care provider. If they do not have a  primary care provider, information for follow-up has been provided to them. All questions have been answered.  ____________________________________________  FINAL CLINICAL IMPRESSION(S) / ED DIAGNOSES  Final diagnoses:  Snake bite, initial encounter     MEDICATIONS GIVEN DURING THIS VISIT:  Medications  Tdap (BOOSTRIX) injection 0.5 mL (0.5 mLs Intramuscular Given 07/09/16 1013)     NEW OUTPATIENT MEDICATIONS STARTED DURING THIS VISIT:  None   Note:  This document was prepared using Dragon voice recognition software and may include unintentional dictation errors.  Nanda Quinton, MD Emergency Medicine   Aerion Bagdasarian, [facility], MD 07/09/16 [phone]

## 2016-07-09 NOTE — ED Triage Notes (Signed)
Pt has dried blood to L middle finger, no bleeding at this time, pt states, "A snake was in my kitchen trash can and I got it out and it got me." pt has pic of snake, no swelling or redness noted, pt able to move finger, A&O x4

## 2016-07-09 NOTE — ED Notes (Signed)
Patient transported to X-ray 

## 2016-07-09 NOTE — ED Notes (Signed)
Pt back from X-ray.  

## 2016-07-22 ENCOUNTER — Ambulatory Visit (INDEPENDENT_AMBULATORY_CARE_PROVIDER_SITE_OTHER): Payer: BLUE CROSS/BLUE SHIELD | Admitting: Internal Medicine

## 2016-07-22 VITALS — BP 104/62 | HR 80 | Temp 97.5°F | Resp 16 | Ht 66.5 in | Wt 226.0 lb

## 2016-07-22 DIAGNOSIS — E349 Endocrine disorder, unspecified: Secondary | ICD-10-CM

## 2016-07-22 DIAGNOSIS — I10 Essential (primary) hypertension: Secondary | ICD-10-CM | POA: Diagnosis not present

## 2016-07-22 DIAGNOSIS — E559 Vitamin D deficiency, unspecified: Secondary | ICD-10-CM

## 2016-07-22 DIAGNOSIS — R7303 Prediabetes: Secondary | ICD-10-CM | POA: Diagnosis not present

## 2016-07-22 DIAGNOSIS — Z79899 Other long term (current) drug therapy: Secondary | ICD-10-CM

## 2016-07-22 DIAGNOSIS — E782 Mixed hyperlipidemia: Secondary | ICD-10-CM | POA: Diagnosis not present

## 2016-07-22 NOTE — Patient Instructions (Signed)

## 2016-07-22 NOTE — Progress Notes (Signed)
This very nice 61 y.o. MWM presents for 3 month follow up with Hypertension, Hyperlipidemia, Pre-Diabetes and Vitamin D Deficiency.      Patient recently underwent Robotic Prostatectomy By Dr Dutch Gray on 06/28/2016 for Prostate Cancer.      Patient is treated for HTN (2000) & BP has been controlled at home. Today's BP is at goal - 104/62. Patient has had no complaints of any cardiac type chest pain, palpitations, dyspnea/orthopnea/PND, dizziness, claudication, or dependent edema.     Hyperlipidemia is not controlled with diet. Patient denies myalgias or other med SE's. Last Lipids were not at goal: Lab Results  Component Value Date   CHOL 209 (H) 07/22/2016   HDL 54 07/22/2016   LDLCALC 110 (H) 07/22/2016   TRIG 225 (H) 07/22/2016   CHOLHDL 3.9 07/22/2016      Also, the patient has history of Morbid Obesity (BMI 36) and consequent PreDiabetes(A1c 5.7% in 2009 and 5.8% in 2012) and he has had no symptoms of reactive hypoglycemia, diabetic polys, paresthesias or visual blurring.  Last A1c was not at goal:  Lab Results  Component Value Date   HGBA1C 5.9 (H) 07/22/2016      Further, the patient also has history of Vitamin D Deficiency ("21" in 2008) and supplements vitamin D without any suspected side-effects. Last vitamin D was   Lab Results  Component Value Date   VD25OH 34 07/22/2016   Current Outpatient Prescriptions on File Prior to Visit  Medication Sig  . benazepril-hydrochlorthiazide (LOTENSIN HCT) 20-25 MG tablet TAKE ONE TABLET BY MOUTH DAILY FOR BLOOD PRESSURE  . bisoprolol-hydrochlorothiazide (ZIAC) 10-6.25 MG tablet TAKE ONE TABLET BY MOUTH DAILY  . buPROPion (WELLBUTRIN XL) 300 MG 24 hr tablet Take 1 tablet (300 mg total) by mouth daily.  . fluticasone furoate-vilanterol (BREO ELLIPTA) 100-25 MCG/INH AEPB Inhale 1 puff into the lungs daily.  . Magnesium 500 MG TABS Take 500 mg by mouth daily.  Marland Kitchen albuterol (PROVENTIL) (2.5 MG/3ML) 0.083% nebulizer solution Take 3 mLs  (2.5 mg total) by nebulization every 6 (six) hours as needed for wheezing or shortness of breath. (Patient not taking: Reported on 07/22/2016)   No current facility-administered medications on file prior to visit.    Allergies  Allergen Reactions  . Codeine     REACTION: Itching  . Isovue [Iopamidol] Hives    Pt. Developed 1 hive after injection; Dr. Kris Hartmann spoke with patient;recommends 13 hr prep in the future   PMHx:   Past Medical History:  Diagnosis Date  . 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 Elevated hemoglobin A1c   . Elevated PSA 03/16/2016  . Hyperlipidemia   . Hypertension   . Personal history of colonic adenomas 12/28/2007  . Pre-diabetes   . Prostate cancer ([place]) 03/16/2016  . Vitamin D deficiency    Immunization History  Administered Date(s) Administered  . Pneumococcal Polysaccharide-23 01/11/1997  . Td 01/12/2004  . Tdap 07/09/2016  . Zoster 01/12/2008   Past Surgical History:  Procedure Laterality Date  . COLONOSCOPY    . LYMPHADENECTOMY N/A 06/28/2016   Procedure: LYMPHADENECTOMY;  Surgeon: Raynelle Bring, MD;  Location: [facility];  Service: Urology;  Laterality: N/A;  . PROSTATE BIOPSY  03/16/2016   Surgical Pathology Gross and Microscopic Examination for Prostate Needle  . ROBOT ASSISTED LAPAROSCOPIC RADICAL PROSTATECTOMY N/A 06/28/2016   Procedure: XI ROBOTIC ASSISTED LAPAROSCOPIC RADICAL PROSTATECTOMY LEVEL 2 EXCISION OF PENILE LESION;  Surgeon: Raynelle Bring, MD;  Location: [facility];  Service: Urology;  Laterality: N/A;   FHx:    Reviewed / unchanged  SHx:    Reviewed / unchanged  Systems Review:  Constitutional: Denies fever, chills, wt changes, headaches, insomnia, fatigue, night sweats, change in appetite. Eyes: Denies redness, blurred vision, diplopia, discharge, itchy, watery eyes.  ENT: Denies discharge, congestion, post nasal drip, epistaxis, sore throat, earache, hearing loss, dental pain, tinnitus, vertigo, sinus pain, snoring.  CV: Denies  chest pain, palpitations, irregular heartbeat, syncope, dyspnea, diaphoresis, orthopnea, PND, claudication or edema. Respiratory: denies cough, dyspnea, DOE, pleurisy, hoarseness, laryngitis, wheezing.  Gastrointestinal: Denies dysphagia, odynophagia, heartburn, reflux, water brash, abdominal pain or cramps, nausea, vomiting, bloating, diarrhea, constipation, hematemesis, melena, hematochezia  or hemorrhoids. Genitourinary: Denies dysuria, frequency, urgency, nocturia, hesitancy, discharge, hematuria or flank pain. Musculoskeletal: Denies arthralgias, myalgias, stiffness, jt. swelling, pain, limping or strain/sprain.  Skin: Denies pruritus, rash, hives, warts, acne, eczema or change in skin lesion(s). Neuro: No weakness, tremor, incoordination, spasms, paresthesia or pain. Psychiatric: Denies confusion, memory loss or sensory loss. Endo: Denies change in weight, skin or hair change.  Heme/Lymph: No excessive bleeding, bruising or enlarged lymph nodes.  Physical Exam  BP 104/62   Pulse 80   Temp (!) 97.5 F (36.4 C)   Resp 16   Ht 5' 6.5" (1.689 m)   Wt 226 lb (102.5 kg)   BMI 35.93 kg/m   Appears over  nourished, well groomed  and in no distress.  Eyes: PERRLA, EOMs, conjunctiva no swelling or erythema. Sinuses: No frontal/maxillary tenderness ENT/Mouth: EAC's clear, TM's nl w/o erythema, bulging. Nares clear w/o erythema, swelling, exudates. Oropharynx clear without erythema or exudates. Oral hygiene is good. Tongue normal, non obstructing. Hearing intact.  Neck: Supple. Thyroid nl. Car 2+/2+ without bruits, nodes or JVD. Chest: Respirations nl with BS clear & equal w/o rales, rhonchi, wheezing or stridor.  Cor: Heart sounds normal w/ regular rate and rhythm without sig. murmurs, gallops, clicks or rubs. Peripheral pulses normal and equal  without edema.  Abdomen: Soft & bowel sounds normal. Non-tender w/o guarding, rebound, hernias, masses or organomegaly.  Lymphatics: Unremarkable.   Musculoskeletal: Full ROM all peripheral extremities, joint stability, 5/5 strength and normal gait.  Skin: Warm, dry without exposed rashes, lesions or ecchymosis apparent.  Neuro: Cranial nerves intact, reflexes equal bilaterally. Sensory-motor testing grossly intact. Tendon reflexes grossly intact.  Pysch: Alert & oriented x 3.  Insight and judgement nl & appropriate. No ideations.  Assessment and Plan:  1. Essential hypertension  - Continue medication, monitor blood pressure at home.  - Continue DASH diet. Reminder to go to the ER if any CP,  SOB, nausea, dizziness, severe HA, changes vision/speech. - CBC with Differential/Platelet - BASIC METABOLIC PANEL WITH GFR - Magnesium - TSH  2. Hyperlipidemia, mixed  - Continue diet/meds, exercise,& lifestyle modifications.  - Continue monitor periodic cholesterol/liver & renal functions   - Hepatic function panel - Lipid panel - TSH  3. Prediabetes  - Continue diet, exercise, lifestyle modifications.  - Monitor appropriate labs. - Hemoglobin A1c - Insulin, random  4. Vitamin D deficiency  - Continue supplementation.  - VITAMIN D 25 Hydroxy  5. Testosterone deficiency  - Testosterone  6. Medication management  - CBC with Differential/Platelet - BASIC METABOLIC PANEL WITH GFR - Hepatic function panel - Magnesium - Lipid panel - TSH - Hemoglobin A1c - Insulin, random - VITAMIN D 25 Hydroxy  - Testosterone       Discussed  regular exercise, BP monitoring, weight control to achieve/maintain BMI less  than 25 and discussed med and SE's. Recommended labs to assess and monitor clinical status with further disposition pending results of labs. Over 30 minutes of exam, counseling, chart review was performed.

## 2016-07-23 LAB — CBC WITH DIFFERENTIAL/PLATELET
Basophils Absolute: 0 cells/uL (ref 0–200)
Basophils Relative: 0 %
Eosinophils Absolute: 360 cells/uL (ref 15–500)
Eosinophils Relative: 4 %
HEMATOCRIT: 50 % (ref 38.5–50.0)
Hemoglobin: 16.8 g/dL (ref 13.2–17.1)
LYMPHS PCT: 29 %
Lymphs Abs: 2610 cells/uL (ref 850–3900)
MCH: 32.7 pg (ref 27.0–33.0)
MCHC: 33.6 g/dL (ref 32.0–36.0)
MCV: 97.5 fL (ref 80.0–100.0)
MONO ABS: 990 {cells}/uL — AB (ref 200–950)
MPV: 9.6 fL (ref 7.5–12.5)
Monocytes Relative: 11 %
Neutro Abs: 5040 cells/uL (ref 1500–7800)
Neutrophils Relative %: 56 %
Platelets: 194 10*3/uL (ref 140–400)
RBC: 5.13 MIL/uL (ref 4.20–5.80)
RDW: 12.4 % (ref 11.0–15.0)
WBC: 9 10*3/uL (ref 3.8–10.8)

## 2016-07-23 LAB — BASIC METABOLIC PANEL WITH GFR
BUN: 16 mg/dL (ref 7–25)
CO2: 24 mmol/L (ref 20–31)
Calcium: 9.2 mg/dL (ref 8.6–10.3)
Chloride: 98 mmol/L (ref 98–110)
Creat: 0.75 mg/dL (ref 0.70–1.33)
GLUCOSE: 131 mg/dL — AB (ref 65–99)
Potassium: 3.7 mmol/L (ref 3.5–5.3)
Sodium: 136 mmol/L (ref 135–146)

## 2016-07-23 LAB — HEPATIC FUNCTION PANEL
ALT: 14 U/L (ref 9–46)
AST: 15 U/L (ref 10–35)
Albumin: 3.7 g/dL (ref 3.6–5.1)
Alkaline Phosphatase: 64 U/L (ref 40–115)
Bilirubin, Direct: 0.1 mg/dL (ref <=0.2)
Indirect Bilirubin: 0.3 mg/dL (ref 0.2–1.2)
TOTAL PROTEIN: 6.5 g/dL (ref 6.1–8.1)
Total Bilirubin: 0.4 mg/dL (ref 0.2–1.2)

## 2016-07-23 LAB — HEMOGLOBIN A1C
Hgb A1c MFr Bld: 5.9 % — ABNORMAL HIGH (ref <5.7)
MEAN PLASMA GLUCOSE: 123 mg/dL

## 2016-07-23 LAB — LIPID PANEL
Cholesterol: 209 mg/dL — ABNORMAL HIGH (ref <200)
HDL: 54 mg/dL (ref >40)
LDL Cholesterol: 110 mg/dL — ABNORMAL HIGH (ref <100)
TRIGLYCERIDES: 225 mg/dL — AB (ref <150)
Total CHOL/HDL Ratio: 3.9 Ratio (ref <5.0)
VLDL: 45 mg/dL — ABNORMAL HIGH (ref <30)

## 2016-07-23 LAB — MAGNESIUM: MAGNESIUM: 1.7 mg/dL (ref 1.5–2.5)

## 2016-07-23 LAB — TSH: TSH: 1.2 mIU/L (ref 0.40–4.50)

## 2016-07-23 LAB — VITAMIN D 25 HYDROXY (VIT D DEFICIENCY, FRACTURES): Vit D, 25-Hydroxy: 80 ng/mL (ref 30–100)

## 2016-07-23 LAB — TESTOSTERONE: Testosterone: 513 ng/dL (ref 250–827)

## 2016-07-23 LAB — INSULIN, RANDOM: Insulin: 104.8 u[IU]/mL — ABNORMAL HIGH (ref 2.0–19.6)

## 2016-07-24 ENCOUNTER — Encounter: Payer: Self-pay | Admitting: Internal Medicine

## 2016-07-29 ENCOUNTER — Inpatient Hospital Stay: Payer: BLUE CROSS/BLUE SHIELD | Admitting: Acute Care

## 2016-08-06 ENCOUNTER — Other Ambulatory Visit: Payer: Self-pay | Admitting: Pulmonary Disease

## 2016-08-06 DIAGNOSIS — R0600 Dyspnea, unspecified: Secondary | ICD-10-CM

## 2016-08-09 ENCOUNTER — Ambulatory Visit (INDEPENDENT_AMBULATORY_CARE_PROVIDER_SITE_OTHER): Payer: BLUE CROSS/BLUE SHIELD | Admitting: Adult Health

## 2016-08-09 ENCOUNTER — Ambulatory Visit (INDEPENDENT_AMBULATORY_CARE_PROVIDER_SITE_OTHER): Payer: BLUE CROSS/BLUE SHIELD | Admitting: Pulmonary Disease

## 2016-08-09 ENCOUNTER — Encounter: Payer: Self-pay | Admitting: Adult Health

## 2016-08-09 ENCOUNTER — Ambulatory Visit (INDEPENDENT_AMBULATORY_CARE_PROVIDER_SITE_OTHER)
Admission: RE | Admit: 2016-08-09 | Discharge: 2016-08-09 | Disposition: A | Discharge status: Home / Self Care | Payer: BLUE CROSS/BLUE SHIELD | Source: Ambulatory Visit | Attending: Adult Health | Admitting: Adult Health

## 2016-08-09 DIAGNOSIS — J9611 Chronic respiratory failure with hypoxia: Secondary | ICD-10-CM | POA: Insufficient documentation

## 2016-08-09 DIAGNOSIS — J449 Chronic obstructive pulmonary disease, unspecified: Secondary | ICD-10-CM

## 2016-08-09 DIAGNOSIS — R0600 Dyspnea, unspecified: Secondary | ICD-10-CM

## 2016-08-09 DIAGNOSIS — F17200 Nicotine dependence, unspecified, uncomplicated: Secondary | ICD-10-CM | POA: Diagnosis not present

## 2016-08-09 LAB — PULMONARY FUNCTION TEST
DL/VA % pred: 93 %
DL/VA: 4.15 ml/min/mmHg/L
DLCO COR % PRED: 76 %
DLCO cor: 21.78 ml/min/mmHg
DLCO unc % pred: 74 %
DLCO unc: 21.08 ml/min/mmHg
FEF 25-75 POST: 0.9 L/s
FEF 25-75 PRE: 0.73 L/s
FEF2575-%Change-Post: 22 %
FEF2575-%Pred-Post: 33 %
FEF2575-%Pred-Pre: 27 %
FEV1-%CHANGE-POST: 8 %
FEV1-%PRED-POST: 50 %
FEV1-%Pred-Pre: 46 %
FEV1-POST: 1.65 L
FEV1-Pre: 1.51 L
FEV1FVC-%Change-Post: 4 %
FEV1FVC-%PRED-PRE: 76 %
FEV6-%CHANGE-POST: 4 %
FEV6-%PRED-POST: 65 %
FEV6-%Pred-Pre: 62 %
FEV6-POST: 2.68 L
FEV6-Pre: 2.57 L
FEV6FVC-%CHANGE-POST: 0 %
FEV6FVC-%PRED-POST: 104 %
FEV6FVC-%Pred-Pre: 104 %
FVC-%Change-Post: 4 %
FVC-%PRED-POST: 63 %
FVC-%Pred-Pre: 60 %
FVC-Post: 2.71 L
FVC-Pre: 2.6 L
PRE FEV1/FVC RATIO: 58 %
Post FEV1/FVC ratio: 61 %
Post FEV6/FVC ratio: 99 %
Pre FEV6/FVC Ratio: 99 %
RV % pred: 174 %
RV: 3.61 L
TLC % PRED: 102 %
TLC: 6.53 L

## 2016-08-09 MED ORDER — FLUTICASONE FUROATE-VILANTEROL 100-25 MCG/INH IN AEPB: 1.0000 | INHALATION_SPRAY | Freq: Every day | RESPIRATORY_TRACT | 4 refills | Status: DC

## 2016-08-09 MED ORDER — ALBUTEROL SULFATE HFA 108 (90 BASE) MCG/ACT IN AERS: 2.0000 | INHALATION_SPRAY | Freq: Four times a day (QID) | RESPIRATORY_TRACT | 5 refills | Status: DC | PRN

## 2016-08-09 MED ORDER — FLUTICASONE FUROATE-VILANTEROL 100-25 MCG/INH IN AEPB
1.0000 | INHALATION_SPRAY | Freq: Every day | RESPIRATORY_TRACT | 5 refills | Status: DC
Start: 2016-08-09 — End: 2016-12-10

## 2016-08-09 MED ORDER — FLUTICASONE FUROATE-VILANTEROL 200-25 MCG/INH IN AEPB
1.0000 | INHALATION_SPRAY | Freq: Every day | RESPIRATORY_TRACT | 0 refills | Status: DC
Start: 2016-08-09 — End: 2017-12-27

## 2016-08-09 NOTE — Progress Notes (Signed)
@Patient  ID: Robert [name], male    DOB: Sep 26, 1955, 60 y.o.   MRN: [id number]  Chief Complaint  Patient presents with  . Follow-up    O2 RF , ?copd     Referring provider: Unk [name], MD  HPI: 61 year old male smoker seen for pulmonary consult for acute hypercarbic and hypoxic respiratory failure in the postop setting. Patient underwent a radical prostatectomy June 2018 and postop had hypoxia.  08/09/2016 Follow up: Post hospital follow up  Patient presents for a post hospital follow-up. Patient was seen for pulmonary consult during his hospitalization in June 2018. Patient underwent a radical prostatectomy June 2018. In the postop setting. He had hypercarbic and hypoxic respiratory failure. Patient was felt to have some postop atelectasis. He was recommended to use oxygen with activity. Started on BREO. Set up for outpatient pulmonary function testing and also to assess for possible underlying sleep disorder. Pulmonary function testing done today shows an FEV1  At 50%, ratio 61, FVC 63% , no significant BD respsonse , DLCO. 74%.  CXR showed BB atx.  He says he is doing very well since discharge. She denies any significant shortness breath. He denies any cough or wheezing flare . He is not using O2 that was started at discharge.  Walk test in office today showed no desaturations. O2 saturation remained 95% on room air..  We discussed any sleep issues. Patient says he sleeps well. Does have some mild snoring. Denies any daytime sleepiness. Feels refreshed upon awaking. Does not feel he has sleep issues.   He is losing his insuranace in Sept. We discussed pt assistance papers for BREO if insurance is not found.     Allergies  Allergen Reactions  . Codeine     REACTION: Itching  . Isovue [Iopamidol] Hives    Pt. Developed 1 hive after injection; Dr. Kris Hartmann spoke with patient;recommends 13 hr prep in the future    Immunization History  Administered Date(s) Administered  .  Pneumococcal Polysaccharide-23 01/11/1997  . Td 01/12/2004  . Tdap 07/09/2016  . Zoster 01/12/2008    Past Medical History:  Diagnosis Date  . 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 Elevated hemoglobin A1c   . Elevated PSA 03/16/2016  . Hyperlipidemia   . Hypertension   . Personal history of colonic adenomas 12/28/2007  . Pre-diabetes   . Prostate cancer ([place]) 03/16/2016  . Vitamin D deficiency     Tobacco History: History  Smoking Status  . Current Every Day Smoker  . Packs/day: 1.50  . Years: 40.00  . Types: Cigarettes  Smokeless Tobacco  . Never Used    Comment: Peak rate of 2ppd. Has never attempted to quit.    Ready to quit: No Counseling given: Yes   Outpatient Encounter Prescriptions as of 08/09/2016  Medication Sig  . albuterol (PROVENTIL) (2.5 MG/3ML) 0.083% nebulizer solution Take 3 mLs (2.5 mg total) by nebulization every 6 (six) hours as needed for wheezing or shortness of breath.  Marland Kitchen aspirin EC 81 MG tablet Take 81 mg by mouth daily.  . benazepril-hydrochlorthiazide (LOTENSIN HCT) 20-25 MG tablet TAKE ONE TABLET BY MOUTH DAILY FOR BLOOD PRESSURE  . bisoprolol-hydrochlorothiazide (ZIAC) 10-6.25 MG tablet TAKE ONE TABLET BY MOUTH DAILY  . buPROPion (WELLBUTRIN XL) 300 MG 24 hr tablet Take 1 tablet (300 mg total) by mouth daily.  . fluticasone furoate-vilanterol (BREO ELLIPTA) 100-25 MCG/INH AEPB Inhale 1 puff into the lungs daily.  . Magnesium 500 MG TABS Take 500 mg by mouth daily.  Marland Kitchen  Vitamin D, Ergocalciferol, (DRISDOL) 50000 units CAPS capsule Take 50,000 Units by mouth. Takes 2 per week.  . fluticasone furoate-vilanterol (BREO ELLIPTA) 100-25 MCG/INH AEPB Inhale 1 puff into the lungs daily.  . fluticasone furoate-vilanterol (BREO ELLIPTA) 200-25 MCG/INH AEPB Inhale 1 puff into the lungs daily.   No facility-administered encounter medications on file as of 08/09/2016.      Review of Systems  Constitutional:   No  weight loss, night sweats,  Fevers,  chills, fatigue, or  lassitude.  HEENT:   No headaches,  Difficulty swallowing,  Tooth/dental problems, or  Sore throat,                No sneezing, itching, ear ache, nasal congestion, post nasal drip,   CV:  No chest pain,  Orthopnea, PND, swelling in lower extremities, anasarca, dizziness, palpitations, syncope.   GI  No heartburn, indigestion, abdominal pain, nausea, vomiting, diarrhea, change in bowel habits, loss of appetite, bloody stools.   Resp: No shortness of breath with exertion or at rest.  No excess mucus, no productive cough,  No non-productive cough,  No coughing up of blood.  No change in color of mucus.  No wheezing.  No chest wall deformity  Skin: no rash or lesions.  GU: no dysuria, change in color of urine, no urgency or frequency.  No flank pain, no hematuria   MS:  No joint pain or swelling.  No decreased range of motion.  No back pain.    Physical Exam  BP 132/74 (BP Location: Left Arm, Cuff Size: Normal)   Pulse (!) 57   Ht 5\' 7"  (1.702 m)   Wt 228 lb (103.4 kg)   SpO2 97%   BMI 35.71 kg/m   GEN: A/Ox3; pleasant , NAD, well nourished    HEENT:  [place]/AT,  EACs-clear, TMs-wnl, NOSE-clear, THROAT-clear, no lesions, no postnasal drip or exudate noted.   NECK:  Supple w/ fair ROM; no JVD; normal carotid impulses w/o bruits; no thyromegaly or nodules palpated; no lymphadenopathy.    RESP  Clear  P & A; w/o, wheezes/ rales/ or rhonchi. no accessory muscle use, no dullness to percussion  CARD:  RRR, no m/r/g, no peripheral edema, pulses intact, no cyanosis or clubbing.  GI:   Soft & nt; nml bowel sounds; no organomegaly or masses detected.   Musco: Warm bil, no deformities or joint swelling noted.   Neuro: alert, no focal deficits noted.    Skin: Warm, no lesions or rashes  Psych:  No change in mood or affect. No depression or anxiety.  No memory loss.  Lab Results:  CBC    Component Value Date/Time   WBC 9.0 07/22/2016 1729   RBC 5.13 07/22/2016  1729   HGB 16.8 07/22/2016 1729   HCT 50.0 07/22/2016 1729   PLT 194 07/22/2016 1729   MCV 97.5 07/22/2016 1729   MCH 32.7 07/22/2016 1729   MCHC 33.6 07/22/2016 1729   RDW 12.4 07/22/2016 1729   LYMPHSABS 2,610 07/22/2016 1729   MONOABS 990 (H) 07/22/2016 1729   EOSABS 360 07/22/2016 1729   BASOSABS 0 07/22/2016 1729    BMET    Component Value Date/Time   NA 136 07/22/2016 1729   K 3.7 07/22/2016 1729   CL 98 07/22/2016 1729   CO2 24 07/22/2016 1729   GLUCOSE 131 (H) 07/22/2016 1729   BUN 16 07/22/2016 1729   CREATININE 0.75 07/22/2016 1729   CALCIUM 9.2 07/22/2016 1729   GFRNONAA >89 07/22/2016  [place] 07/22/2016 1729    BNP No results found for: BNP  ProBNP No results found for: PROBNP  Imaging: No results found.   Assessment & Plan:   COPD (chronic obstructive pulmonary disease) ([place]) Gold 3 COPD In active smoker  Smoking cessation discussed  Cont on BREO .  Pt assistance to help with cost if insurance is lost.  Check cxr   Plan  Patient Instructions  Continue on BREO daily, rinse after use. Work on not smoking. May discontinue oxygen.  Chest xray today .  Follow-up with Dr. Ashok Cordia  in 4 months and As needed       Tobacco use disorder Smoking cessation   Chronic respiratory failure with hypoxia ([facility]CC) O2 sats are improved with no ambulatory desats on room air  He denies sleep apnea sx. Will hold off of sleep study at this time  D/c O2 .      Rexene Edison, NP 08/09/2016

## 2016-08-09 NOTE — Progress Notes (Signed)
PFT done today. 

## 2016-08-09 NOTE — Patient Instructions (Addendum)
Continue on BREO daily, rinse after use. Work on not smoking. May discontinue oxygen.  Chest xray today .  Follow-up with Dr. Ashok Cordia  in 4 months and As needed

## 2016-08-09 NOTE — Addendum Note (Signed)
Addended by: Parke Poisson E on: 08/09/2016 04:05 PM   Modules accepted: Orders

## 2016-08-09 NOTE — Assessment & Plan Note (Signed)
O2 sats are improved with no ambulatory desats on room air  He denies sleep apnea sx. Will hold off of sleep study at this time  D/c O2 .

## 2016-08-09 NOTE — Addendum Note (Signed)
Addended by: Parke Poisson E on: 08/09/2016 04:56 PM   Modules accepted: Orders

## 2016-08-09 NOTE — Assessment & Plan Note (Signed)
Smoking cessation  

## 2016-08-09 NOTE — Assessment & Plan Note (Signed)
Gold 3 COPD In active smoker  Smoking cessation discussed  Cont on BREO .  Pt assistance to help with cost if insurance is lost.  Check cxr   Plan  Patient Instructions  Continue on BREO daily, rinse after use. Work on not smoking. May discontinue oxygen.  Chest xray today .  Follow-up with Dr. Ashok Cordia  in 4 months and As needed

## 2016-09-04 ENCOUNTER — Other Ambulatory Visit: Payer: Self-pay | Admitting: Internal Medicine

## 2016-09-04 DIAGNOSIS — I10 Essential (primary) hypertension: Secondary | ICD-10-CM

## 2016-09-10 ENCOUNTER — Other Ambulatory Visit: Payer: Self-pay | Admitting: Internal Medicine

## 2016-10-27 ENCOUNTER — Encounter: Payer: Self-pay | Admitting: Internal Medicine

## 2016-12-10 ENCOUNTER — Encounter: Payer: Self-pay | Admitting: Pulmonary Disease

## 2016-12-10 ENCOUNTER — Ambulatory Visit (INDEPENDENT_AMBULATORY_CARE_PROVIDER_SITE_OTHER): Payer: Self-pay | Admitting: Pulmonary Disease

## 2016-12-10 VITALS — BP 132/74 | HR 71 | Ht 67.0 in | Wt 241.5 lb

## 2016-12-10 DIAGNOSIS — J449 Chronic obstructive pulmonary disease, unspecified: Secondary | ICD-10-CM

## 2016-12-10 DIAGNOSIS — F17200 Nicotine dependence, unspecified, uncomplicated: Secondary | ICD-10-CM

## 2016-12-10 NOTE — Progress Notes (Signed)
Subjective:    Patient ID: Robert [name], male    DOB: [birth year], 61 y.o.   MRN: [id number]  C.C.:  Follow-up for Severe COPD & Tobacco Use Disorder.  HPI Severe COPD: Previously started on Breo during prior hospitalization. He reports no new dyspnea. Reports an occasional cough. No wheezing. No exacerbations since last appointment. He uses his rescue inhaler rarely.   Tobacco use disorder:  He reports he has cut back to just less than 1ppd. He isn't using anything to help himself quit. He knows he needs to quit but enjoys smoking.   Review of Systems No chest pain or pressure. No fever, chills or sweats. No abdominal pain or nausea.   Allergies  Allergen Reactions  . Codeine     REACTION: Itching  . Isovue [Iopamidol] Hives    Pt. Developed 1 hive after injection; Dr. Kris Hartmann spoke with patient;recommends 13 hr prep in the future    Current Outpatient Medications on File Prior to Visit  Medication Sig Dispense Refill  . albuterol (PROAIR HFA) 108 (90 Base) MCG/ACT inhaler Inhale 2 puffs into the lungs every 6 (six) hours as needed for wheezing or shortness of breath. 1 Inhaler 5  . aspirin EC 81 MG tablet Take 81 mg by mouth daily.    . benazepril-hydrochlorthiazide (LOTENSIN HCT) 20-25 MG tablet TAKE ONE TABLET BY MOUTH DAILY FOR BLOOD PRESSURE 90 tablet 1  . bisoprolol-hydrochlorothiazide (ZIAC) 10-6.25 MG tablet TAKE ONE TABLET BY MOUTH DAILY 90 tablet 0  . buPROPion (WELLBUTRIN XL) 300 MG 24 hr tablet Take 1 tablet (300 mg total) by mouth daily. 90 tablet 1  . fluticasone furoate-vilanterol (BREO ELLIPTA) 200-25 MCG/INH AEPB Inhale 1 puff into the lungs daily. 1 each 0  . Magnesium 500 MG TABS Take 500 mg by mouth daily.    . Vitamin D, Ergocalciferol, (DRISDOL) 50000 units CAPS capsule Take 50,000 Units by mouth. Takes 2 per week.     No current facility-administered medications on file prior to visit.     Past Medical History:  Diagnosis Date  . Bladder neck obstruction  02/18/2016  . Elevated hemoglobin A1c   . Elevated PSA 03/16/2016  . Hyperlipidemia   . Hypertension   . Personal history of colonic adenomas 12/28/2007  . Pre-diabetes   . Prostate cancer ([place]) 03/16/2016  . Vitamin D deficiency     Past Surgical History:  Procedure Laterality Date  . COLONOSCOPY    . LYMPHADENECTOMY N/A 06/28/2016   Procedure: LYMPHADENECTOMY;  Surgeon: Raynelle Bring, MD;  Location: [facility];  Service: Urology;  Laterality: N/A;  . PROSTATE BIOPSY  03/16/2016   Surgical Pathology Gross and Microscopic Examination for Prostate Needle  . ROBOT ASSISTED LAPAROSCOPIC RADICAL PROSTATECTOMY N/A 06/28/2016   Procedure: XI ROBOTIC ASSISTED LAPAROSCOPIC RADICAL PROSTATECTOMY LEVEL 2 EXCISION OF PENILE LESION;  Surgeon: Raynelle Bring, MD;  Location: [facility];  Service: Urology;  Laterality: N/A;    Family History  Problem Relation Age of Onset  . Cancer Mother        lung  . Lung disease Neg Hx     Social History   Socioeconomic History  . Marital status: Married    Spouse name: None  . Number of children: None  . Years of education: None  . Highest education level: None  Social Needs  . Financial resource strain: None  . Food insecurity - worry: None  . Food insecurity - inability: None  . Transportation needs - medical: None  .  Transportation needs - non-medical: None  Occupational History  . None  Tobacco Use  . Smoking status: Current Every Day Smoker    Packs/day: 1.50    Years: 40.00    Pack years: 60.00    Types: Cigarettes  . Smokeless tobacco: Never Used  . Tobacco comment: Peak rate of 2ppd. Has never attempted to quit.   Substance and Sexual Activity  . Alcohol use: Yes    Alcohol/week: 2.4 oz    Types: 4 Cans of beer per week  . Drug use: No  . Sexual activity: Yes  Other Topics Concern  . None  Social History Narrative   [facility] (06/29/16):   Currently works as a Furniture conservator/restorer. Exposure primarily to aluminum and steel. No history  of bird or mold exposure. No recent hot tub exposure. Does have 2 dogs.      Objective:   Physical Exam BP 132/74 (BP Location: Left Arm, Cuff Size: Normal)   Pulse 71   Ht 5\' 7"  (1.702 m)   Wt 241 lb 8 oz (109.5 kg)   SpO2 94%   BMI 37.82 kg/m  General:  Awake. Alert. Central obesity.  Integument:  Warm & dry. No rash on exposed skin. No bruising on exposed skin. Extremities:  No cyanosis or clubbing.  HEENT:  Moist mucus membranes. No scleral icterus. No scleral injection Cardiovascular:  Regular rate. No appreciable JVD.  Pulmonary:  Good aeration & clear to auscultation bilaterally. Symmetric chest wall expansion. No accessory muscle use on room air. Abdomen: Soft. Normal bowel sounds. Protuberant. Musculoskeletal:  Normal bulk and tone. Hand grip strength 5/5 bilaterally. No joint deformity or effusion appreciated. Neurological:  Cranial nerves 2-12 grossly in tact. No meningismus. Moving all 4 extremities equally.   PFT 08/09/16: FVC 2.60 L (60%) FEV1 1.51 L (46%) FEV1/FVC 0.58 FEF 25-75 0.73 L (27%) negative bronchodilator response TLC 6.53 L (102%) RV 174% ERV 51% DLCO corrected 76%    Assessment & Plan:  61 y.o. male with underlying severe COPD based upon pole right function testing from July. Patient also had evidence of air trapping on his lung volumes which may be artificially elevating his car monoxide diffusion capacity. Even so, symptomatically he is well controlled on his current inhaler regimen. As such, I will continue him on Breo. I instructed the patient to contact our office if he had any new breathing problems or questions before his next appointment.  1. Severe COPD: Continue meeting Breo. No changes. Screening for alpha-1 antitrypsin deficiency. 2. Tobacco use disorder: Patient counseled for over 3 minutes only for complete tobacco cessation. However, he admits he is not ready to quit yet. 3. Health maintenance:  Status post Pneumovax 23 in January 1999 & Tdap  June 2018. Administering Quad Flu Vaccine today. Plan for Prevnar vaccine at next appointment. 4. Follow-up: Return to clinic in 6 months or sooner if needed.  Sonia Baller Ashok Cordia, M.D. [facility] Pager:  [phone] After 7pm or if no response, call [phone] 4:41 PM 12/10/16

## 2016-12-10 NOTE — Patient Instructions (Signed)
   Continue using your medications & inhalers as prescribed.  Call my office if you have any new breathing problems or questions before your next appointment.

## 2016-12-11 ENCOUNTER — Other Ambulatory Visit: Payer: Self-pay | Admitting: Internal Medicine

## 2016-12-13 ENCOUNTER — Other Ambulatory Visit: Payer: Self-pay | Admitting: Pulmonary Disease

## 2016-12-13 DIAGNOSIS — J449 Chronic obstructive pulmonary disease, unspecified: Secondary | ICD-10-CM

## 2016-12-14 ENCOUNTER — Other Ambulatory Visit: Payer: Self-pay

## 2016-12-14 DIAGNOSIS — J449 Chronic obstructive pulmonary disease, unspecified: Secondary | ICD-10-CM

## 2016-12-23 LAB — ALPHA-1 ANTITRYPSIN PHENOTYPE: A-1 Antitrypsin, Ser: 157 mg/dL (ref 83–199)

## 2017-03-02 ENCOUNTER — Ambulatory Visit: Payer: Self-pay | Admitting: Adult Health

## 2017-03-02 ENCOUNTER — Encounter: Payer: Self-pay | Admitting: Adult Health

## 2017-03-02 VITALS — BP 110/72 | HR 64 | Temp 97.1°F | Resp 18 | Ht 67.0 in | Wt 231.4 lb

## 2017-03-02 DIAGNOSIS — L02419 Cutaneous abscess of limb, unspecified: Secondary | ICD-10-CM

## 2017-03-02 MED ORDER — CEPHALEXIN 500 MG PO CAPS: 500.0000 mg | ORAL_CAPSULE | Freq: Four times a day (QID) | ORAL | 1 refills | Status: DC

## 2017-03-02 NOTE — Patient Instructions (Addendum)
Cephalexin tablets or capsules What is this medicine? CEPHALEXIN (sef a LEX in) is a cephalosporin antibiotic. It is used to treat certain kinds of bacterial infections It will not work for colds, flu, or other viral infections. This medicine may be used for other purposes; ask your health care provider or pharmacist if you have questions. COMMON BRAND NAME(S): Biocef, Daxbia, Keflex, Keftab What should I tell my health care provider before I take this medicine? They need to know if you have any of these conditions: -kidney disease -stomach or intestine problems, especially colitis -an unusual or allergic reaction to cephalexin, other cephalosporins, penicillins, other antibiotics, medicines, foods, dyes or preservatives -pregnant or trying to get pregnant -breast-feeding How should I use this medicine? Take this medicine by mouth with a full glass of water. Follow the directions on the prescription label. This medicine can be taken with or without food. Take your medicine at regular intervals. Do not take your medicine more often than directed. Take all of your medicine as directed even if you think you are better. Do not skip doses or stop your medicine early. Talk to your pediatrician regarding the use of this medicine in children. While this drug may be prescribed for selected conditions, precautions do apply. Overdosage: If you think you have taken too much of this medicine contact a poison control center or emergency room at once. NOTE: This medicine is only for you. Do not share this medicine with others. What if I miss a dose? If you miss a dose, take it as soon as you can. If it is almost time for your next dose, take only that dose. Do not take double or extra doses. There should be at least 4 to 6 hours between doses. What may interact with this medicine? -probenecid -some other antibiotics This list may not describe all possible interactions. Give your health care provider a list of  all the medicines, herbs, non-prescription drugs, or dietary supplements you use. Also tell them if you smoke, drink alcohol, or use illegal drugs. Some items may interact with your medicine. What should I watch for while using this medicine? Tell your doctor or health care professional if your symptoms do not begin to improve in a few days. Do not treat diarrhea with over the counter products. Contact your doctor if you have diarrhea that lasts more than 2 days or if it is severe and watery. If you have diabetes, you may get a false-positive result for sugar in your urine. Check with your doctor or health care professional. What side effects may I notice from receiving this medicine? Side effects that you should report to your doctor or health care professional as soon as possible: -allergic reactions like skin rash, itching or hives, swelling of the face, lips, or tongue -breathing problems -pain or trouble passing urine -redness, blistering, peeling or loosening of the skin, including inside the mouth -severe or watery diarrhea -unusually weak or tired -yellowing of the eyes, skin Side effects that usually do not require medical attention (report to your doctor or health care professional if they continue or are bothersome): -gas or heartburn -genital or anal irritation -headache -joint or muscle pain -nausea, vomiting This list may not describe all possible side effects. Call your doctor for medical advice about side effects. You may report side effects to FDA at 1-800-FDA-1088. Where should I keep my medicine? Keep out of the reach of children. Store at room temperature between 59 and 86 degrees F (15 and   30 degrees C). Throw away any unused medicine after the expiration date. NOTE: This sheet is a summary. It may not cover all possible information. If you have questions about this medicine, talk to your doctor, pharmacist, or health care provider.  2018 Elsevier/Gold Standard  (2007-04-03 17:09:13)  

## 2017-03-02 NOTE — Progress Notes (Deleted)
This very nice 62 y.o. MWM presents for 3 month follow up with Hypertension, Hyperlipidemia, Pre-Diabetes and Vitamin D Deficiency.      Patient is treated for HTN & BP has been controlled at home. Today's  . Patient has had no complaints of any cardiac type chest pain, palpitations, dyspnea / orthopnea / PND, dizziness, claudication, or dependent edema.     Hyperlipidemia is controlled with diet & meds. Patient denies myalgias or other med SE's. Last Lipids were  Lab Results  Component Value Date   CHOL 209 (H) 07/22/2016   HDL 54 07/22/2016   LDLCALC 110 (H) 07/22/2016   TRIG 225 (H) 07/22/2016   CHOLHDL 3.9 07/22/2016      Also, the patient has history of T2_NIDDM PreDiabetes and has had no symptoms of reactive hypoglycemia, diabetic polys, paresthesias or visual blurring.  Last A1c was  Lab Results  Component Value Date   HGBA1C 5.9 (H) 07/22/2016      Further, the patient also has history of Vitamin D Deficiency and supplements vitamin D without any suspected side-effects. Last vitamin D was   Lab Results  Component Value Date   VD25OH 21 07/22/2016   Current Outpatient Medications on File Prior to Visit  Medication Sig  . albuterol (PROAIR HFA) 108 (90 Base) MCG/ACT inhaler Inhale 2 puffs into the lungs every 6 (six) hours as needed for wheezing or shortness of breath.  Marland Kitchen aspirin EC 81 MG tablet Take 81 mg by mouth daily.  . benazepril-hydrochlorthiazide (LOTENSIN HCT) 20-25 MG tablet TAKE ONE TABLET BY MOUTH DAILY FOR BLOOD PRESSURE  . bisoprolol-hydrochlorothiazide (ZIAC) 10-6.25 MG tablet TAKE ONE TABLET BY MOUTH DAILY  . buPROPion (WELLBUTRIN XL) 300 MG 24 hr tablet TAKE ONE TABLET BY MOUTH DAILY  . fluticasone furoate-vilanterol (BREO ELLIPTA) 200-25 MCG/INH AEPB Inhale 1 puff into the lungs daily.  . Magnesium 500 MG TABS Take 500 mg by mouth daily.  . Vitamin D, Ergocalciferol, (DRISDOL) 50000 units CAPS capsule Take 50,000 Units by mouth. Takes 2 per week.   No  current facility-administered medications on file prior to visit.    Allergies  Allergen Reactions  . Codeine     REACTION: Itching  . Isovue [Iopamidol] Hives    Pt. Developed 1 hive after injection; Dr. Kris Hartmann spoke with patient;recommends 13 hr prep in the future   PMHx:   Past Medical History:  Diagnosis Date  . 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 Elevated hemoglobin A1c   . Elevated PSA 03/16/2016  . Hyperlipidemia   . Hypertension   . Personal history of colonic adenomas 12/28/2007  . Pre-diabetes   . Prostate cancer ([place]) 03/16/2016  . Vitamin D deficiency    Immunization History  Administered Date(s) Administered  . Pneumococcal Polysaccharide-23 01/11/1997  . Td 01/12/2004  . Tdap 07/09/2016  . Zoster 01/12/2008   Past Surgical History:  Procedure Laterality Date  . COLONOSCOPY    . LYMPHADENECTOMY N/A 06/28/2016   Procedure: LYMPHADENECTOMY;  Surgeon: Raynelle Bring, MD;  Location: [facility];  Service: Urology;  Laterality: N/A;  . PROSTATE BIOPSY  03/16/2016   Surgical Pathology Gross and Microscopic Examination for Prostate Needle  . ROBOT ASSISTED LAPAROSCOPIC RADICAL PROSTATECTOMY N/A 06/28/2016   Procedure: XI ROBOTIC ASSISTED LAPAROSCOPIC RADICAL PROSTATECTOMY LEVEL 2 EXCISION OF PENILE LESION;  Surgeon: Raynelle Bring, MD;  Location: [facility];  Service: Urology;  Laterality: N/A;   FHx:    Reviewed / unchanged  SHx:    Reviewed /  unchanged  Systems Review:  Constitutional: Denies fever, chills, wt changes, headaches, insomnia, fatigue, night sweats, change in appetite. Eyes: Denies redness, blurred vision, diplopia, discharge, itchy, watery eyes.  ENT: Denies discharge, congestion, post nasal drip, epistaxis, sore throat, earache, hearing loss, dental pain, tinnitus, vertigo, sinus pain, snoring.  CV: Denies chest pain, palpitations, irregular heartbeat, syncope, dyspnea, diaphoresis, orthopnea, PND, claudication or edema. Respiratory: denies cough,  dyspnea, DOE, pleurisy, hoarseness, laryngitis, wheezing.  Gastrointestinal: Denies dysphagia, odynophagia, heartburn, reflux, water brash, abdominal pain or cramps, nausea, vomiting, bloating, diarrhea, constipation, hematemesis, melena, hematochezia  or hemorrhoids. Genitourinary: Denies dysuria, frequency, urgency, nocturia, hesitancy, discharge, hematuria or flank pain. Musculoskeletal: Denies arthralgias, myalgias, stiffness, jt. swelling, pain, limping or strain/sprain.  Skin: Denies pruritus, rash, hives, warts, acne, eczema or change in skin lesion(s). Neuro: No weakness, tremor, incoordination, spasms, paresthesia or pain. Psychiatric: Denies confusion, memory loss or sensory loss. Endo: Denies change in weight, skin or hair change.  Heme/Lymph: No excessive bleeding, bruising or enlarged lymph nodes.  Physical Exam  There were no vitals taken for this visit.  Appears well nourished, well groomed  and in no distress.  Eyes: PERRLA, EOMs, conjunctiva no swelling or erythema. Sinuses: No frontal/maxillary tenderness ENT/Mouth: EAC's clear, TM's nl w/o erythema, bulging. Nares clear w/o erythema, swelling, exudates. Oropharynx clear without erythema or exudates. Oral hygiene is good. Tongue normal, non obstructing. Hearing intact.  Neck: Supple. Thyroid not palpable. Car 2+/2+ without bruits, nodes or JVD. Chest: Respirations nl with BS clear & equal w/o rales, rhonchi, wheezing or stridor.  Cor: Heart sounds normal w/ regular rate and rhythm without sig. murmurs, gallops, clicks or rubs. Peripheral pulses normal and equal  without edema.  Abdomen: Soft & bowel sounds normal. Non-tender w/o guarding, rebound, hernias, masses or organomegaly.  Lymphatics: Unremarkable.  Musculoskeletal: Full ROM all peripheral extremities, joint stability, 5/5 strength and normal gait.  Skin: Warm, dry without exposed rashes, lesions or ecchymosis apparent.  Neuro: Cranial nerves intact, reflexes equal  bilaterally. Sensory-motor testing grossly intact. Tendon reflexes grossly intact.  Pysch: Alert & oriented x 3.  Insight and judgement nl & appropriate. No ideations.  Assessment and Plan:  - Continue medication, monitor blood pressure at home.  - Continue DASH diet. Reminder to go to the ER if any CP,  SOB, nausea, dizziness, severe HA, changes vision/speech.  - Continue diet/meds, exercise,& lifestyle modifications.  - Continue monitor periodic cholesterol/liver & renal functions   - Continue diet, exercise, lifestyle modifications.  - Monitor appropriate labs. - Continue supplementation.      Discussed  regular exercise, BP monitoring, weight control to achieve/maintain BMI less than 25 and discussed med and SE's. Recommended labs to assess and monitor clinical status with further disposition pending results of labs. Over 30 minutes of exam, counseling, chart review was performed.

## 2017-03-02 NOTE — Progress Notes (Signed)
Assessment and Plan:  Robert [name] was seen today for cyst.  Diagnoses and all orders for this visit:  Axillary abscess Apply hot compresses frequently to promote drainage. Discussed weight loss may be necessary for permanent resolution - ? Mild Hidradenitis suppurativa though currently appears to be without tunneling Reassured that this represents a benign process. Oral antibiotics  -     cephALEXin (KEFLEX) 500 MG capsule; Take 1 capsule (500 mg total) by mouth 4 (four) times daily. Follow up in 2 weeks for re-evaluation and routine follow up   Further disposition pending results of labs. Discussed med's effects and SE's.   Over 15 minutes of exam, counseling, chart review, and critical decision making was performed.   No future appointments.  ------------------------------------------------------------------------------------------------------------------   HPI BP 110/72   Pulse 64   Temp (!) 97.1 F (36.2 C)   Resp 18   Ht 5\' 7"  (1.702 m)   Wt 231 lb 6.4 oz (105 kg)   BMI 36.24 kg/m   62 y.o.male with hx of prediabetes presents for new abcess of bilateral axilla; he reports first began under L arm over a week ago, now appears to be improving, with new abcesses in R. Endorses some mild discomfort associated with these, but denies fever/chills/nausea/vomiting, or other systemic symptoms.   Past Medical History:  Diagnosis Date  . 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 Elevated hemoglobin A1c   . Elevated PSA 03/16/2016  . Hyperlipidemia   . Hypertension   . Personal history of colonic adenomas 12/28/2007  . Pre-diabetes   . Prostate cancer ([place]) 03/16/2016  . Vitamin D deficiency      Allergies  Allergen Reactions  . Codeine     REACTION: Itching  . Isovue [Iopamidol] Hives    Pt. Developed 1 hive after injection; Dr. Kris [name] spoke with patient;recommends 13 hr prep in the future    Current Outpatient Medications on File Prior to Visit  Medication Sig  . albuterol  (PROAIR HFA) 108 (90 Base) MCG/ACT inhaler Inhale 2 puffs into the lungs every 6 (six) hours as needed for wheezing or shortness of breath.  Marland Kitchen aspirin EC 81 MG tablet Take 81 mg by mouth daily.  . benazepril-hydrochlorthiazide (LOTENSIN HCT) 20-25 MG tablet TAKE ONE TABLET BY MOUTH DAILY FOR BLOOD PRESSURE  . bisoprolol-hydrochlorothiazide (ZIAC) 10-6.25 MG tablet TAKE ONE TABLET BY MOUTH DAILY  . buPROPion (WELLBUTRIN XL) 300 MG 24 hr tablet TAKE ONE TABLET BY MOUTH DAILY  . fluticasone furoate-vilanterol (BREO ELLIPTA) 200-25 MCG/INH AEPB Inhale 1 puff into the lungs daily.  . Magnesium 500 MG TABS Take 500 mg by mouth daily.  . Vitamin D, Ergocalciferol, (DRISDOL) 50000 units CAPS capsule Take 50,000 Units by mouth. Takes 2 per week.   No current facility-administered medications on file prior to visit.     ROS: all negative except above.   Physical Exam:  BP 110/72   Pulse 64   Temp (!) 97.1 F (36.2 C)   Resp 18   Ht 5\' 7"  (1.702 m)   Wt 231 lb 6.4 oz (105 kg)   BMI 36.24 kg/m   General Appearance: Well nourished, in no apparent distress. Neck: Supple.  Respiratory: Respiratory effort normal Cardio: RRR with no MRGs. Brisk peripheral pulses without edema.  Abdomen: Soft, + BS. Lymphatics: Non tender without lymphadenopathy.  Musculoskeletal: normal gait.  Skin: Warm, dry; single abcess that appears to have drained spontaneously and healing to left axilla - similar to right, with second lower abcess with firm  base approx 1cm - not fluctuant, surrounded by mild inflamed area superficially with "head" to center - not currently draining but with some dried green purulent discharge visible. Psych: Awake and oriented X 3, normal affect, Insight and Judgment appropriate.     Robert Ribas, NP 5:34 PM [facility]

## 2017-03-15 ENCOUNTER — Ambulatory Visit: Payer: Self-pay | Admitting: Internal Medicine

## 2017-03-15 VITALS — BP 116/74 | HR 64 | Temp 97.2°F | Resp 16 | Ht 67.0 in | Wt 229.0 lb

## 2017-03-15 DIAGNOSIS — L02419 Cutaneous abscess of limb, unspecified: Secondary | ICD-10-CM

## 2017-03-16 ENCOUNTER — Encounter: Payer: Self-pay | Admitting: Internal Medicine

## 2017-03-16 NOTE — Progress Notes (Signed)
     Patient returned for f/u of a Rt axillary abscess after tx with Keflex x 10 hays & reports the abscess opened up and drained w/o palpable cyst or tenderness now.     Exam finds a small skin pucker at site of recent infected cyst w/o signs of current infection.    I filled out life insurance claim forms for his recently deceased wife on Xmas morning with death felt due to a cardiac arrhythmia.   (No Charge today)

## 2017-03-19 ENCOUNTER — Other Ambulatory Visit: Payer: Self-pay | Admitting: Internal Medicine

## 2017-03-19 DIAGNOSIS — I10 Essential (primary) hypertension: Secondary | ICD-10-CM

## 2017-05-30 ENCOUNTER — Other Ambulatory Visit: Payer: Self-pay | Admitting: Adult Health

## 2017-06-18 ENCOUNTER — Other Ambulatory Visit: Payer: Self-pay | Admitting: Internal Medicine

## 2017-06-18 ENCOUNTER — Other Ambulatory Visit: Payer: Self-pay | Admitting: Adult Health

## 2017-06-18 DIAGNOSIS — I10 Essential (primary) hypertension: Secondary | ICD-10-CM

## 2017-09-19 ENCOUNTER — Ambulatory Visit (INDEPENDENT_AMBULATORY_CARE_PROVIDER_SITE_OTHER): Payer: Self-pay | Admitting: Internal Medicine

## 2017-09-19 ENCOUNTER — Encounter: Payer: Self-pay | Admitting: Internal Medicine

## 2017-09-19 VITALS — BP 110/64 | HR 64 | Temp 97.0°F | Resp 18 | Ht 66.5 in | Wt 231.0 lb

## 2017-09-19 DIAGNOSIS — E782 Mixed hyperlipidemia: Secondary | ICD-10-CM

## 2017-09-19 DIAGNOSIS — F17200 Nicotine dependence, unspecified, uncomplicated: Secondary | ICD-10-CM

## 2017-09-19 DIAGNOSIS — R7303 Prediabetes: Secondary | ICD-10-CM

## 2017-09-19 DIAGNOSIS — I10 Essential (primary) hypertension: Secondary | ICD-10-CM

## 2017-09-19 DIAGNOSIS — E559 Vitamin D deficiency, unspecified: Secondary | ICD-10-CM

## 2017-09-19 DIAGNOSIS — Z79899 Other long term (current) drug therapy: Secondary | ICD-10-CM

## 2017-09-19 DIAGNOSIS — Z8249 Family history of ischemic heart disease and other diseases of the circulatory system: Secondary | ICD-10-CM | POA: Insufficient documentation

## 2017-09-19 LAB — COMPLETE METABOLIC PANEL WITH GFR
AG RATIO: 1.5 (calc) (ref 1.0–2.5)
ALBUMIN MSPROF: 4 g/dL (ref 3.6–5.1)
ALKALINE PHOSPHATASE (APISO): 63 U/L (ref 40–115)
ALT: 16 U/L (ref 9–46)
AST: 16 U/L (ref 10–35)
BUN: 14 mg/dL (ref 7–25)
CHLORIDE: 99 mmol/L (ref 98–110)
CO2: 30 mmol/L (ref 20–32)
Calcium: 9.1 mg/dL (ref 8.6–10.3)
Creat: 1.09 mg/dL (ref 0.70–1.25)
GFR, EST AFRICAN AMERICAN: 85 mL/min/{1.73_m2} (ref >=60)
GFR, Est Non African American: 73 mL/min/{1.73_m2} (ref >=60)
Globulin: 2.6 g/dL (calc) (ref 1.9–3.7)
Glucose, Bld: 105 mg/dL — ABNORMAL HIGH (ref 65–99)
POTASSIUM: 3.8 mmol/L (ref 3.5–5.3)
Sodium: 138 mmol/L (ref 135–146)
Total Bilirubin: 0.3 mg/dL (ref 0.2–1.2)
Total Protein: 6.6 g/dL (ref 6.1–8.1)

## 2017-09-19 NOTE — Progress Notes (Signed)
This very nice 62 y.o. WWM presents for 6 month follow up with HTN, HLD, Pre-Diabetes and Vitamin D Deficiency.          Patient has hx/o Prostate Cancer undergoing  Robotic Prostatectomy in  June 2018  by Dr Dutch Gray and continues 6 monthly f/u's.        Patient is treated for HTN (2000) & BP has been controlled at home. Today's BP is at goal - 110/64. Patient has had no complaints of any cardiac type chest pain, palpitations, dyspnea / orthopnea / PND, dizziness, claudication, or dependent edema.     Hyperlipidemia is not controlled with diet & meds. Patient denies myalgias or other med SE's. Last Lipids were not at goal:  Lab Results  Component Value Date   CHOL 209 (H) 07/22/2016   HDL 54 07/22/2016   LDLCALC 110 (H) 07/22/2016   TRIG 225 (H) 07/22/2016   CHOLHDL 3.9 07/22/2016      Also, the patient has history of Morbid Obesity (BMI 36+) and  PreDiabetes (A1c 5.7%/2009 & 5.8%/2012) and has had no symptoms of reactive hypoglycemia, diabetic polys, paresthesias or visual blurring.  Last A1c was not at goal: Lab Results  Component Value Date   HGBA1C 5.9 (H) 07/22/2016      Further, the patient also has history of Vitamin D Deficiency ("21"/2008) and supplements vitamin D without any suspected side-effects. Last vitamin D was at goal:  Lab Results  Component Value Date   VD25OH 61 07/22/2016   Current Outpatient Medications on File Prior to Visit  Medication Sig  . albuterol (PROAIR HFA) 108 (90 Base) MCG/ACT inhaler Inhale 2 puffs into the lungs every 6 (six) hours as needed for wheezing or shortness of breath.  Marland Kitchen aspirin EC 81 MG tablet Take 81 mg by mouth daily.  . benazepril-hydrochlorthiazide (LOTENSIN HCT) 20-25 MG tablet TAKE ONE TABLET BY MOUTH DAILY FOR BLOOD PRESSURE  . bisoprolol-hydrochlorothiazide (ZIAC) 10-6.25 MG tablet TAKE ONE TABLET BY MOUTH DAILY  . BREO ELLIPTA 100-25 MCG/INH AEPB INHALE ONE DOSE INTO THE LUNGS  DAILY  . buPROPion (WELLBUTRIN XL) 300 MG  24 hr tablet TAKE ONE TABLET BY MOUTH DAILY  . fluticasone furoate-vilanterol (BREO ELLIPTA) 200-25 MCG/INH AEPB Inhale 1 puff into the lungs daily.  . Magnesium 500 MG TABS Take 500 mg by mouth daily.  . Vitamin D, Ergocalciferol, (DRISDOL) 50000 units CAPS capsule Take 50,000 Units by mouth. Takes 2 per week.   No current facility-administered medications on file prior to visit.    Allergies  Allergen Reactions  . Codeine     REACTION: Itching  . Isovue [Iopamidol] Hives    Pt. Developed 1 hive after injection; Dr. Kris Hartmann spoke with patient;recommends 13 hr prep in the future   PMHx:   Past Medical History:  Diagnosis Date  . 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 Elevated hemoglobin A1c   . Elevated PSA 03/16/2016  . Hyperlipidemia   . Hypertension   . Personal history of colonic adenomas 12/28/2007  . Pre-diabetes   . Prostate cancer ([place]) 03/16/2016  . Vitamin D deficiency    Immunization History  Administered Date(s) Administered  . Pneumococcal Polysaccharide-23 01/11/1997  . Td 01/12/2004  . Tdap 07/09/2016  . Zoster 01/12/2008   Past Surgical History:  Procedure Laterality Date  . COLONOSCOPY    . LYMPHADENECTOMY N/A 06/28/2016   Procedure: LYMPHADENECTOMY;  Surgeon: Raynelle Bring, MD;  Location: [facility];  Service: Urology;  Laterality: N/A;  .  PROSTATE BIOPSY  03/16/2016   Surgical Pathology Gross and Microscopic Examination for Prostate Needle  . ROBOT ASSISTED LAPAROSCOPIC RADICAL PROSTATECTOMY N/A 06/28/2016   Procedure: XI ROBOTIC ASSISTED LAPAROSCOPIC RADICAL PROSTATECTOMY LEVEL 2 EXCISION OF PENILE LESION;  Surgeon: Raynelle Bring, MD;  Location: [facility];  Service: Urology;  Laterality: N/A;   FHx:    Reviewed / unchanged  SHx:    Reviewed / unchanged   Systems Review:  Constitutional: Denies fever, chills, wt changes, headaches, insomnia, fatigue, night sweats, change in appetite. Eyes: Denies redness, blurred vision, diplopia, discharge, itchy, watery  eyes.  ENT: Denies discharge, congestion, post nasal drip, epistaxis, sore throat, earache, hearing loss, dental pain, tinnitus, vertigo, sinus pain, snoring.  CV: Denies chest pain, palpitations, irregular heartbeat, syncope, dyspnea, diaphoresis, orthopnea, PND, claudication or edema. Respiratory: denies cough, dyspnea, DOE, pleurisy, hoarseness, laryngitis, wheezing.  Gastrointestinal: Denies dysphagia, odynophagia, heartburn, reflux, water brash, abdominal pain or cramps, nausea, vomiting, bloating, diarrhea, constipation, hematemesis, melena, hematochezia  or hemorrhoids. Genitourinary: Denies dysuria, frequency, urgency, nocturia, hesitancy, discharge, hematuria or flank pain. Musculoskeletal: Denies arthralgias, myalgias, stiffness, jt. swelling, pain, limping or strain/sprain.  Skin: Denies pruritus, rash, hives, warts, acne, eczema or change in skin lesion(s). Neuro: No weakness, tremor, incoordination, spasms, paresthesia or pain. Psychiatric: Denies confusion, memory loss or sensory loss. Endo: Denies change in weight, skin or hair change.  Heme/Lymph: No excessive bleeding, bruising or enlarged lymph nodes.  Physical Exam  BP 110/64   Pulse 64   Temp (!) 97 F (36.1 C)   Resp 18   Ht 5' 6.5" (1.689 m)   Wt 231 lb (104.8 kg)   BMI 36.73 kg/m   Appears  well nourished, well groomed  and in no distress.  Eyes: PERRLA, EOMs, conjunctiva no swelling or erythema. Sinuses: No frontal/maxillary tenderness ENT/Mouth: EAC's clear, TM's nl w/o erythema, bulging. Nares clear w/o erythema, swelling, exudates. Oropharynx clear without erythema or exudates. Oral hygiene is good. Tongue normal, non obstructing. Hearing intact.  Neck: Supple. Thyroid not palpable. Car 2+/2+ without bruits, nodes or JVD. Chest: Respirations nl with BS clear & equal w/o rales, rhonchi, wheezing or stridor.  Cor: Heart sounds normal w/ regular rate and rhythm without sig. murmurs, gallops, clicks or rubs.  Peripheral pulses normal and equal  without edema.  Abdomen: Soft & bowel sounds normal. Non-tender w/o guarding, rebound, hernias, masses or organomegaly.  Lymphatics: Unremarkable.  Musculoskeletal: Full ROM all peripheral extremities, joint stability, 5/5 strength and normal gait.  Skin: Warm, dry without exposed rashes, lesions or ecchymosis apparent.  Neuro: Cranial nerves intact, reflexes equal bilaterally. Sensory-motor testing grossly intact. Tendon reflexes grossly intact.  Pysch: Alert & oriented x 3.  Insight and judgement nl & appropriate. No ideations.  Assessment and Plan:  1. Essential hypertension  - Continue medication, monitor blood pressure at home.  - Continue DASH diet.  Reminder to go to the ER if any CP,  SOB, nausea, dizziness, severe HA, changes vision/speech.  - COMPLETE METABOLIC PANEL WITH GFR  2. Hyperlipidemia, mixed  - Continue diet/meds, exercise,& lifestyle modifications.  - Continue monitor periodic cholesterol/liver & renal functions   3. Prediabetes  - Continue diet, exercise, lifestyle modifications.  - Monitor appropriate labs.  - COMPLETE METABOLIC PANEL WITH GFR  4. Vitamin D deficiency  - Continue supplementation.  5. Smoker  - encouraged smoking cessation  6. Medication management  - COMPLETE METABOLIC PANEL WITH GFR       Discussed  regular exercise, BP monitoring, weight  control to achieve/maintain BMI less than 25 and discussed med and SE's. Recommended labs to assess and monitor clinical status with further disposition pending results of labs. Over 30 minutes of exam, counseling, chart review was performed.

## 2017-09-19 NOTE — Patient Instructions (Addendum)
Hypertension As your heart beats, it forces blood through your arteries. This force is your blood pressure. If the pressure is too high, it is called hypertension (HTN) or high blood pressure. HTN is dangerous because you may have it and not know it. High blood pressure may mean that your heart has to work harder to pump blood. Your arteries may be narrow or stiff. The extra work puts you at risk for heart disease, stroke, and other problems.  Blood pressure consists of two numbers, a higher number over a lower, 110/72, for example. It is stated as "110 over 72." The ideal is below 120 for the top number (systolic) and under 80 for the bottom (diastolic). Write down your blood pressure today. You should pay close attention to your blood pressure if you have certain conditions such as:  Heart failure.  Prior heart attack.  Diabetes  Chronic kidney disease.  Prior stroke.  Multiple risk factors for heart disease. To see if you have HTN, your blood pressure should be measured while you are seated with your arm held at the level of the heart. It should be measured at least twice. A one-time elevated blood pressure reading (especially in the Emergency Department) does not mean that you need treatment. There may be conditions in which the blood pressure is different between your right and left arms. It is important to see your caregiver soon for a recheck. Most people have essential hypertension which means that there is not a specific cause. This type of high blood pressure may be lowered by changing lifestyle factors such as:  Stress.  Smoking.  Lack of exercise.  Excessive weight.  Drug/tobacco/alcohol use.  Eating less salt. Most people do not have symptoms from high blood pressure until it has caused damage to the body. Effective treatment can often prevent, delay or reduce that damage. TREATMENT  When a cause has been identified, treatment for high blood pressure is directed at the  cause. There are a large number of medications to treat HTN. These fall into several categories, and your caregiver will help you select the medicines that are best for you. Medications may have side effects. You should review side effects with your caregiver. If your blood pressure stays high after you have made lifestyle changes or started on medicines,   Your medication(s) may need to be changed.  Other problems may need to be addressed.  Be certain you understand your prescriptions, and know how and when to take your medicine.  Be sure to follow up with your caregiver within the time frame advised (usually within two weeks) to have your blood pressure rechecked and to review your medications.  If you are taking more than one medicine to lower your blood pressure, make sure you know how and at what times they should be taken. Taking two medicines at the same time can result in blood pressure that is too low. SEEK IMMEDIATE MEDICAL CARE IF:  You develop a severe headache, blurred or changing vision, or confusion.  You have unusual weakness or numbness, or a faint feeling.  You have severe chest or abdominal pain, vomiting, or breathing problems. MAKE SURE YOU:   Understand these instructions.  Will watch your condition.  Will get help right away if you are not doing well or get worse.  Diabetes and Exercise Exercising regularly is important. It is not just about losing weight. It has many health benefits, such as:  Improving your overall fitness, flexibility, and endurance.  Increasing your bone density.  Helping with weight control.  Decreasing your body fat.  Increasing your muscle strength.  Reducing stress and tension.  Improving your overall health. People with diabetes who exercise gain additional benefits because exercise:  Reduces appetite.  Improves the body's use of blood sugar (glucose).  Helps lower or control blood glucose.  Decreases blood  pressure.  Helps control blood lipids (such as cholesterol and triglycerides).  Improves the body's use of the hormone insulin by:  Increasing the body's insulin sensitivity.  Reducing the body's insulin needs.  Decreases the risk for heart disease because exercising:  Lowers cholesterol and triglycerides levels.  Increases the levels of good cholesterol (such as high-density lipoproteins [HDL]) in the body.  Lowers blood glucose levels. YOUR ACTIVITY PLAN  Choose an activity that you enjoy and set realistic goals. Your health care provider or diabetes educator can help you make an activity plan that works for you. You can break activities into 2 or 3 sessions throughout the day. Doing so is as good as one long session. Exercise ideas include:  Taking the dog for a walk.  Taking the stairs instead of the elevator.  Dancing to your favorite song.  Doing your favorite exercise with a friend. RECOMMENDATIONS FOR EXERCISING WITH TYPE 1 OR TYPE 2 DIABETES   Check your blood glucose before exercising. If blood glucose levels are greater than 240 mg/dL, check for urine ketones. Do not exercise if ketones are present.  Avoid injecting insulin into areas of the body that are going to be exercised. For example, avoid injecting insulin into:  The arms when playing tennis.  The legs when jogging.  Keep a record of:  Food intake before and after you exercise.  Expected peak times of insulin action.  Blood glucose levels before and after you exercise.  The type and amount of exercise you have done.  Review your records with your health care provider. Your health care provider will help you to develop guidelines for adjusting food intake and insulin amounts before and after exercising.  If you take insulin or oral hypoglycemic agents, watch for signs and symptoms of hypoglycemia. They include:  Dizziness.  Shaking.  Sweating.  Chills.  Confusion.  Drink plenty of water  while you exercise to prevent dehydration or heat stroke. Body water is lost during exercise and must be replaced.  Talk to your health care provider before starting an exercise program to make sure it is safe for you. Remember, almost any type of activity is better than none.  Cholesterol Cholesterol is a white, waxy, fat-like protein needed by your body in small amounts. The liver makes all the cholesterol you need. It is carried from the liver by the blood through the blood vessels. Deposits (plaque) may build up on blood vessel walls. This makes the arteries narrower and stiffer. Plaque increases the risk for heart attack and stroke. You cannot feel your cholesterol level even if it is very high. The only way to know is by a blood test to check your lipid (fats) levels. Once you know your cholesterol levels, you should keep a record of the test results. Work with your caregiver to to keep your levels in the desired range. WHAT THE RESULTS MEAN:  Total cholesterol is a rough measure of all the cholesterol in your blood.  LDL is the so-called bad cholesterol. This is the type that deposits cholesterol in the walls of the arteries. You want this level to be low.  HDL is the good cholesterol because it cleans the arteries and carries the LDL away. You want this level to be high.  Triglycerides are fat that the body can either burn for energy or store. High levels are closely linked to heart disease. DESIRED LEVELS:  Total cholesterol below 200.  LDL below 100 for people at risk, below 70 for very high risk.  HDL above 50 is good, above 60 is best.  Triglycerides below 150. HOW TO LOWER YOUR CHOLESTEROL:  Diet.  Choose fish or white meat chicken and [place], roasted or baked. Limit fatty cuts of red meat, fried foods, and processed meats, such as sausage and lunch meat.  Eat lots of fresh fruits and vegetables. Choose whole grains, beans, pasta, potatoes and cereals.  Use only small  amounts of olive, corn or canola oils. Avoid butter, mayonnaise, shortening or palm kernel oils. Avoid foods with trans-fats.  Use skim/nonfat milk and low-fat/nonfat yogurt and cheeses. Avoid whole milk, cream, ice cream, egg yolks and cheeses. Healthy desserts include angel food cake, ginger snaps, animal crackers, hard candy, popsicles, and low-fat/nonfat frozen yogurt. Avoid pastries, cakes, pies and cookies.  Exercise.  A regular program helps decrease LDL and raises HDL.  Helps with weight control.  Do things that increase your activity level like gardening, walking, or taking the stairs.  Medication.  May be prescribed by your caregiver to help lowering cholesterol and the risk for heart disease.  You may need medicine even if your levels are normal if you have several risk factors. HOME CARE INSTRUCTIONS   Follow your diet and exercise programs as suggested by your caregiver.  Take medications as directed.  Have blood work done when your caregiver feels it is necessary. MAKE SURE YOU:   Understand these instructions.  Will watch your condition.  Will get help right away if you are not doing well or get worse. +++++++++++++++++++++++++++++ Vitamin D Deficiency Vitamin D is an important vitamin that your body needs. Having too little of it in your body is called a deficiency. A very bad deficiency can make your bones soft and can cause a condition called rickets.  Vitamin D is important to your body for different reasons, such as:   It helps your body absorb 2 minerals called calcium and phosphorus.  It helps make your bones healthy.  It may prevent some diseases, such as diabetes and multiple sclerosis.  It helps your muscles and heart. You can get vitamin D in several ways. It is a natural part of some foods. The vitamin is also added to some dairy products and cereals. Some people take vitamin D supplements. Also, your body makes vitamin D when you are in the sun.  It changes the sun's rays into a form of the vitamin that your body can use. CAUSES   Not eating enough foods that contain vitamin D.  Not getting enough sunlight.  Having certain digestive system diseases that make it hard to absorb vitamin D. These diseases include Crohn's disease, chronic pancreatitis, and cystic fibrosis.  Having a surgery in which part of the stomach or small intestine is removed.  Being obese. Fat cells pull vitamin D out of your blood. That means that obese people may not have enough vitamin D left in their blood and in other body tissues.  Having chronic kidney or liver disease. RISK FACTORS Risk factors are things that make you more likely to develop a vitamin D deficiency. They include:  Being older.  Not  being able to get outside very much.  Living in a nursing home.  Having had broken bones.  Having weak or thin bones (osteoporosis).  Having a disease or condition that changes how your body absorbs vitamin D.  Having dark skin.  Some medicines such as seizure medicines or steroids.  Being overweight or obese. SYMPTOMS Mild cases of vitamin D deficiency may not have any symptoms. If you have a very bad case, symptoms may include:  Bone pain.  Muscle pain.  Falling often.  Broken bones caused by a minor injury, due to osteoporosis. DIAGNOSIS A blood test is the best way to tell if you have a vitamin D deficiency. TREATMENT Vitamin D deficiency can be treated in different ways. Treatment for vitamin D deficiency depends on what is causing it. Options include:  Taking vitamin D supplements.  Taking a calcium supplement. Your caregiver will suggest what dose is best for you. HOME CARE INSTRUCTIONS  Take any supplements that your caregiver prescribes. Follow the directions carefully. Take only the suggested amount.  Have your blood tested 2 months after you start taking supplements.  Eat foods that contain vitamin D. Healthy choices  include:  Fortified dairy products, cereals, or juices. Fortified means vitamin D has been added to the food. Check the label on the package to be sure.  Fatty fish like salmon or trout.  Eggs.  Oysters.  Do not use a tanning bed.  Keep your weight at a healthy level. Lose weight if you need to.  Keep all follow-up appointments. Your caregiver will need to perform blood tests to make sure your vitamin D deficiency is going away.

## 2017-10-08 ENCOUNTER — Other Ambulatory Visit: Payer: Self-pay | Admitting: Internal Medicine

## 2017-11-08 ENCOUNTER — Ambulatory Visit: Payer: Self-pay | Admitting: Internal Medicine

## 2017-11-14 ENCOUNTER — Other Ambulatory Visit: Payer: Self-pay | Admitting: Adult Health

## 2017-12-03 ENCOUNTER — Other Ambulatory Visit: Payer: Self-pay | Admitting: Adult Health

## 2017-12-07 ENCOUNTER — Encounter: Payer: Self-pay | Admitting: Internal Medicine

## 2017-12-22 ENCOUNTER — Other Ambulatory Visit: Payer: Self-pay

## 2017-12-22 ENCOUNTER — Other Ambulatory Visit: Payer: Self-pay | Admitting: Adult Health

## 2017-12-22 MED ORDER — BISOPROLOL-HYDROCHLOROTHIAZIDE 10-6.25 MG PO TABS: 1.0000 | ORAL_TABLET | Freq: Every day | ORAL | 0 refills | Status: DC

## 2017-12-24 ENCOUNTER — Other Ambulatory Visit: Payer: Self-pay | Admitting: Adult Health

## 2017-12-27 ENCOUNTER — Encounter: Payer: Self-pay | Admitting: Pulmonary Disease

## 2017-12-27 ENCOUNTER — Ambulatory Visit (INDEPENDENT_AMBULATORY_CARE_PROVIDER_SITE_OTHER): Payer: Self-pay | Admitting: Pulmonary Disease

## 2017-12-27 VITALS — BP 130/74 | HR 60 | Ht 66.5 in | Wt 232.0 lb

## 2017-12-27 DIAGNOSIS — Z23 Encounter for immunization: Secondary | ICD-10-CM

## 2017-12-27 DIAGNOSIS — J449 Chronic obstructive pulmonary disease, unspecified: Secondary | ICD-10-CM

## 2017-12-27 DIAGNOSIS — F17200 Nicotine dependence, unspecified, uncomplicated: Secondary | ICD-10-CM

## 2017-12-27 MED ORDER — FLUTICASONE FUROATE-VILANTEROL 100-25 MCG/INH IN AEPB: INHALATION_SPRAY | RESPIRATORY_TRACT | 4 refills | Status: DC

## 2017-12-27 NOTE — Patient Instructions (Signed)
We will vaccinate you with flu and pneumovax Restart Breo 100 and albuterol as needed We will see if we can get you on patient assistance due to your uninsured status  Continue to work on smoking cessation Follow-up in 6 months.

## 2017-12-27 NOTE — Progress Notes (Signed)
Robert [name]    [id number]    Mar 15, 1955  Primary Care Physician:McKeown, Gwyndolyn Saxon, MD  Referring Physician: Unk [name], MD [address] [place], [place] [address]  Chief complaint: Follow-up for severe COPD  HPI: 62 year old active smoker with COPD, prostate cancer.  Previously followed by Dr. Tyrone Schimke on [facility] inhaler with good control of symptoms.  He ran out of the inhaler 2 weeks ago and has noticed progressive worsening of dyspnea, wheezing, chest congestion.  Pets: 1 cat, dog.  No birds, farm animals Occupation: Works as a Furniture conservator/restorer Exposures: Exposed to aluminum and steel dust.  No mold, hot tub, Jacuzzi Smoking history: 80-pack-year smoker.  Continues to smoke 15 cigarettes a day Travel history: No significant travel Relevant family history: No reported family history of lung problems.  Outpatient Encounter Medications as of 12/27/2017  Medication Sig  . albuterol (PROAIR HFA) 108 (90 Base) MCG/ACT inhaler Inhale 2 puffs into the lungs every 6 (six) hours as needed for wheezing or shortness of breath.  Marland Kitchen aspirin EC 81 MG tablet Take 81 mg by mouth daily.  . benazepril-hydrochlorthiazide (LOTENSIN HCT) 20-25 MG tablet TAKE ONE TABLET BY MOUTH DAILY FOR BLOOD PRESSURE  . bisoprolol-hydrochlorothiazide (ZIAC) 10-6.25 MG tablet TAKE ONE TABLET BY MOUTH DAILY  . buPROPion (WELLBUTRIN XL) 300 MG 24 hr tablet TAKE ONE TABLET BY MOUTH DAILY  . Magnesium 500 MG TABS Take 500 mg by mouth daily.  . Vitamin D, Ergocalciferol, (DRISDOL) 50000 units CAPS capsule Take 50,000 Units by mouth. Takes 2 per week.  . [DISCONTINUED] fluticasone furoate-vilanterol (BREO ELLIPTA) 200-25 MCG/INH AEPB Inhale 1 puff into the lungs daily.  Marland Kitchen BREO ELLIPTA 100-25 MCG/INH AEPB INHALE ONE DOSE INTO THE LUNGS  DAILY (Patient not taking: Reported on 12/27/2017)   No facility-administered encounter medications on file as of 12/27/2017.     Allergies as of  12/27/2017 - Review Complete 12/27/2017  Allergen Reaction Noted  . Codeine  12/12/2007  . Isovue [iopamidol] Hives 03/30/2016    Past Medical History:  Diagnosis Date  . 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 Elevated hemoglobin A1c   . Elevated PSA 03/16/2016  . Hyperlipidemia   . Hypertension   . Personal history of colonic adenomas 12/28/2007  . Pre-diabetes   . Prostate cancer ([place]) 03/16/2016  . Vitamin D deficiency     Past Surgical History:  Procedure Laterality Date  . COLONOSCOPY    . LYMPHADENECTOMY N/A 06/28/2016   Procedure: LYMPHADENECTOMY;  Surgeon: Raynelle Bring, MD;  Location: [facility];  Service: Urology;  Laterality: N/A;  . PROSTATE BIOPSY  03/16/2016   Surgical Pathology Gross and Microscopic Examination for Prostate Needle  . ROBOT ASSISTED LAPAROSCOPIC RADICAL PROSTATECTOMY N/A 06/28/2016   Procedure: XI ROBOTIC ASSISTED LAPAROSCOPIC RADICAL PROSTATECTOMY LEVEL 2 EXCISION OF PENILE LESION;  Surgeon: Raynelle Bring, MD;  Location: [facility];  Service: Urology;  Laterality: N/A;    Family History  Problem Relation Age of Onset  . Cancer Mother        lung  . Lung disease Neg Hx     Social History   Socioeconomic History  . Marital status: Married    Spouse name: Not on file  . Number of children: Not on file  . Years of education: Not on file  . Highest education level: Not on file  Occupational History  . Not on file  Social Needs  . Financial resource strain: Not on file  . Food  insecurity:    Worry: Not on file    Inability: Not on file  . Transportation needs:    Medical: Not on file    Non-medical: Not on file  Tobacco Use  . Smoking status: Current Every Day Smoker    Packs/day: 0.75    Years: 40.00    Pack years: 30.00    Types: Cigarettes  . Smokeless tobacco: Never Used  . Tobacco comment: Peak rate of 2ppd. Has never attempted to quit.   Substance and Sexual Activity  . Alcohol use: Yes    Alcohol/week: 4.0 standard drinks     Types: 4 Cans of beer per week  . Drug use: No  . Sexual activity: Yes  Lifestyle  . Physical activity:    Days per week: Not on file    Minutes per session: Not on file  . Stress: Not on file  Relationships  . Social connections:    Talks on phone: Not on file    Gets together: Not on file    Attends religious service: Not on file    Active member of club or organization: Not on file    Attends meetings of clubs or organizations: Not on file    Relationship status: Not on file  . Intimate partner violence:    Fear of current or ex partner: Not on file    Emotionally abused: Not on file    Physically abused: Not on file    Forced sexual activity: Not on file  Other Topics Concern  . Not on file  Social History Narrative   [facility] (06/29/16):   Currently works as a Furniture conservator/restorer. Exposure primarily to aluminum and steel. No history of bird or mold exposure. No recent hot tub exposure. Does have 2 dogs.    Review of systems: Review of Systems  Constitutional: Negative for fever and chills.  HENT: Negative.   Eyes: Negative for blurred vision.  Respiratory: as per HPI  Cardiovascular: Negative for chest pain and palpitations.  Gastrointestinal: Negative for vomiting, diarrhea, blood per rectum. Genitourinary: Negative for dysuria, urgency, frequency and hematuria.  Musculoskeletal: Negative for myalgias, back pain and joint pain.  Skin: Negative for itching and rash.  Neurological: Negative for dizziness, tremors, focal weakness, seizures and loss of consciousness.  Endo/Heme/Allergies: Negative for environmental allergies.  Psychiatric/Behavioral: Negative for depression, suicidal ideas and hallucinations.  All other systems reviewed and are negative.  Physical Exam: Blood pressure 130/74, pulse 60, height 5' 6.5" (1.689 m), weight 232 lb (105.2 kg), SpO2 93 %. Gen:      No acute distress HEENT:  EOMI, sclera anicteric Neck:     No masses; no thyromegaly Lungs:     Clear to auscultation bilaterally; normal respiratory effort CV:         Regular rate and rhythm; no murmurs Abd:      + bowel sounds; soft, non-tender; no palpable masses, no distension Ext:    No edema; adequate peripheral perfusion Skin:      Warm and dry; no rash Neuro: alert and oriented x 3 Psych: normal mood and affect  Data Reviewed: Imaging:  CT abdomen pelvis 03/30/2016- lung bases show minimal basilar atelectasis. Chest x-ray 08/09/2016- no active cardiopulmonary disease I reviewed the images personally.  PFTs: 08/09/2016 FVC 2.71 [63%], FEV1 1.65 [50%], F/F 61, TLC 102%, RV/TLC 172%, DLCO 74% Moderate obstruction with air trapping.  Mild diffusion defect.  Labs: CBC 07/22/2016-WBC 9, eos 4%, absolute eosinophil count 360 Alpha-1 antitrypsin 12/14/2016-157,  PI MM  Assessment:  COPD Symptoms worse after he ran out of inhalers.  Resume Breo and albuterol as needed  Active smoker Discussed smoking cessation.  He is currently on Wellbutrin and is using nicotine patches He will be a candidate for low-dose screening CTs of the chest but would like to hold off for now since he is uninsured  Health maintenance Flu and Pneumovax today.  Plan/Recommendations: - Resume Breo, albuterol - Smoking cessation - Flu and Pneumovax  Marshell Garfinkel MD [facility] 12/27/2017, 9:38 AM  CC: Unk [name], MD

## 2018-01-26 ENCOUNTER — Other Ambulatory Visit: Payer: Self-pay | Admitting: Internal Medicine

## 2018-01-26 DIAGNOSIS — I10 Essential (primary) hypertension: Secondary | ICD-10-CM

## 2018-01-29 ENCOUNTER — Other Ambulatory Visit: Payer: Self-pay

## 2018-01-29 ENCOUNTER — Inpatient Hospital Stay (HOSPITAL_COMMUNITY)
Admission: EM | Admit: 2018-01-29 | Discharge: 2018-02-02 | DRG: 190 | Disposition: A | Discharge status: Home / Self Care | Payer: Self-pay | Attending: Family Medicine | Admitting: Family Medicine

## 2018-01-29 ENCOUNTER — Emergency Department (HOSPITAL_COMMUNITY): Payer: Self-pay

## 2018-01-29 ENCOUNTER — Encounter (HOSPITAL_COMMUNITY): Payer: Self-pay | Admitting: Emergency Medicine

## 2018-01-29 DIAGNOSIS — Z885 Allergy status to narcotic agent status: Secondary | ICD-10-CM

## 2018-01-29 DIAGNOSIS — I452 Bifascicular block: Secondary | ICD-10-CM | POA: Diagnosis present

## 2018-01-29 DIAGNOSIS — Z8546 Personal history of malignant neoplasm of prostate: Secondary | ICD-10-CM

## 2018-01-29 DIAGNOSIS — F329 Major depressive disorder, single episode, unspecified: Secondary | ICD-10-CM | POA: Diagnosis present

## 2018-01-29 DIAGNOSIS — J9601 Acute respiratory failure with hypoxia: Secondary | ICD-10-CM | POA: Diagnosis present

## 2018-01-29 DIAGNOSIS — E872 Acidosis: Secondary | ICD-10-CM | POA: Diagnosis present

## 2018-01-29 DIAGNOSIS — F17210 Nicotine dependence, cigarettes, uncomplicated: Secondary | ICD-10-CM | POA: Diagnosis present

## 2018-01-29 DIAGNOSIS — J441 Chronic obstructive pulmonary disease with (acute) exacerbation: Principal | ICD-10-CM | POA: Diagnosis present

## 2018-01-29 DIAGNOSIS — I10 Essential (primary) hypertension: Secondary | ICD-10-CM | POA: Diagnosis present

## 2018-01-29 DIAGNOSIS — E782 Mixed hyperlipidemia: Secondary | ICD-10-CM | POA: Diagnosis present

## 2018-01-29 DIAGNOSIS — Z801 Family history of malignant neoplasm of trachea, bronchus and lung: Secondary | ICD-10-CM

## 2018-01-29 DIAGNOSIS — Z7982 Long term (current) use of aspirin: Secondary | ICD-10-CM

## 2018-01-29 DIAGNOSIS — Z86010 Personal history of colonic polyps: Secondary | ICD-10-CM

## 2018-01-29 DIAGNOSIS — Z91041 Radiographic dye allergy status: Secondary | ICD-10-CM

## 2018-01-29 DIAGNOSIS — Z8249 Family history of ischemic heart disease and other diseases of the circulatory system: Secondary | ICD-10-CM

## 2018-01-29 DIAGNOSIS — E7800 Pure hypercholesterolemia, unspecified: Secondary | ICD-10-CM

## 2018-01-29 DIAGNOSIS — J9602 Acute respiratory failure with hypercapnia: Secondary | ICD-10-CM | POA: Diagnosis present

## 2018-01-29 DIAGNOSIS — Z716 Tobacco abuse counseling: Secondary | ICD-10-CM

## 2018-01-29 DIAGNOSIS — E559 Vitamin D deficiency, unspecified: Secondary | ICD-10-CM | POA: Diagnosis present

## 2018-01-29 DIAGNOSIS — Z9079 Acquired absence of other genital organ(s): Secondary | ICD-10-CM

## 2018-01-29 DIAGNOSIS — Z720 Tobacco use: Secondary | ICD-10-CM

## 2018-01-29 DIAGNOSIS — Z79899 Other long term (current) drug therapy: Secondary | ICD-10-CM

## 2018-01-29 DIAGNOSIS — J40 Bronchitis, not specified as acute or chronic: Secondary | ICD-10-CM

## 2018-01-29 LAB — I-STAT ARTERIAL BLOOD GAS, ED
Bicarbonate: 30 mmol/L — ABNORMAL HIGH (ref 20.0–28.0)
O2 Saturation: 98 %
Patient temperature: 98.6
TCO2: 32 mmol/L (ref 22–32)
pCO2 arterial: 66.2 mmHg (ref 32.0–48.0)
pH, Arterial: 7.265 — ABNORMAL LOW (ref 7.350–7.450)
pO2, Arterial: 122 mmHg — ABNORMAL HIGH (ref 83.0–108.0)

## 2018-01-29 LAB — CBC WITH DIFFERENTIAL/PLATELET
Abs Immature Granulocytes: 0.04 10*3/uL (ref 0.00–0.07)
Basophils Absolute: 0 10*3/uL (ref 0.0–0.1)
Basophils Relative: 0 %
Eosinophils Absolute: 0 10*3/uL (ref 0.0–0.5)
Eosinophils Relative: 0 %
HCT: 54.2 % — ABNORMAL HIGH (ref 39.0–52.0)
Hemoglobin: 17.9 g/dL — ABNORMAL HIGH (ref 13.0–17.0)
Immature Granulocytes: 0 %
Lymphocytes Relative: 9 %
Lymphs Abs: 1.1 10*3/uL (ref 0.7–4.0)
MCH: 32.6 pg (ref 26.0–34.0)
MCHC: 33 g/dL (ref 30.0–36.0)
MCV: 98.7 fL (ref 80.0–100.0)
Monocytes Absolute: 1.4 10*3/uL — ABNORMAL HIGH (ref 0.1–1.0)
Monocytes Relative: 11 %
NRBC: 0 % (ref 0.0–0.2)
Neutro Abs: 9.6 10*3/uL — ABNORMAL HIGH (ref 1.7–7.7)
Neutrophils Relative %: 80 %
Platelets: 179 10*3/uL (ref 150–400)
RBC: 5.49 MIL/uL (ref 4.22–5.81)
RDW: 12 % (ref 11.5–15.5)
WBC: 12.2 10*3/uL — ABNORMAL HIGH (ref 4.0–10.5)

## 2018-01-29 LAB — I-STAT CHEM 8, ED
BUN: 16 mg/dL (ref 6–20)
CREATININE: 0.8 mg/dL (ref 0.61–1.24)
Calcium, Ion: 1.07 mmol/L — ABNORMAL LOW (ref 1.15–1.40)
Chloride: 91 mmol/L — ABNORMAL LOW (ref 98–111)
Glucose, Bld: 139 mg/dL — ABNORMAL HIGH (ref 70–99)
HEMATOCRIT: 52 % (ref 39.0–52.0)
Hemoglobin: 17.7 g/dL — ABNORMAL HIGH (ref 13.0–17.0)
Potassium: 4.8 mmol/L (ref 3.5–5.1)
SODIUM: 130 mmol/L — AB (ref 135–145)
TCO2: 32 mmol/L (ref 22–32)

## 2018-01-29 LAB — I-STAT TROPONIN, ED: Troponin i, poc: 0.03 ng/mL (ref 0.00–0.08)

## 2018-01-29 LAB — BRAIN NATRIURETIC PEPTIDE: B Natriuretic Peptide: 159.1 pg/mL — ABNORMAL HIGH (ref 0.0–100.0)

## 2018-01-29 LAB — COMPREHENSIVE METABOLIC PANEL
ALT: 21 U/L (ref 0–44)
AST: 26 U/L (ref 15–41)
Albumin: 3.8 g/dL (ref 3.5–5.0)
Alkaline Phosphatase: 64 U/L (ref 38–126)
Anion gap: 12 (ref 5–15)
BUN: 9 mg/dL (ref 6–20)
CO2: 26 mmol/L (ref 22–32)
Calcium: 8.6 mg/dL — ABNORMAL LOW (ref 8.9–10.3)
Chloride: 93 mmol/L — ABNORMAL LOW (ref 98–111)
Creatinine, Ser: 0.76 mg/dL (ref 0.61–1.24)
GFR calc non Af Amer: >60 mL/min (ref >60)
Glucose, Bld: 142 mg/dL — ABNORMAL HIGH (ref 70–99)
Potassium: 4.4 mmol/L (ref 3.5–5.1)
Sodium: 131 mmol/L — ABNORMAL LOW (ref 135–145)
Total Bilirubin: 0.6 mg/dL (ref 0.3–1.2)
Total Protein: 7.8 g/dL (ref 6.5–8.1)

## 2018-01-29 LAB — INFLUENZA PANEL BY PCR (TYPE A & B)
Influenza A By PCR: NEGATIVE
Influenza B By PCR: NEGATIVE

## 2018-01-29 LAB — I-STAT CG4 LACTIC ACID, ED: Lactic Acid, Venous: 1.24 mmol/L (ref 0.5–1.9)

## 2018-01-29 MED ORDER — IPRATROPIUM-ALBUTEROL 0.5-2.5 (3) MG/3ML IN SOLN
3.0000 mL | Freq: Three times a day (TID) | RESPIRATORY_TRACT | Status: DC
  Administered 2018-01-30 – 2018-02-01 (×7): 3 mL via RESPIRATORY_TRACT
  Filled 2018-01-29 (×7): qty 3

## 2018-01-29 MED ORDER — BENAZEPRIL HCL 5 MG PO TABS
20.0000 mg | ORAL_TABLET | Freq: Every day | ORAL | Status: DC
  Administered 2018-01-29 – 2018-02-02 (×5): 20 mg via ORAL
  Filled 2018-01-29 (×5): qty 4

## 2018-01-29 MED ORDER — GUAIFENESIN-DM 100-10 MG/5ML PO SYRP
5.0000 mL | ORAL_SOLUTION | ORAL | Status: DC | PRN
  Administered 2018-02-02: 5 mL via ORAL
  Filled 2018-01-29: qty 5

## 2018-01-29 MED ORDER — ASPIRIN EC 81 MG PO TBEC
81.0000 mg | DELAYED_RELEASE_TABLET | Freq: Every day | ORAL | Status: DC
  Administered 2018-01-29 – 2018-02-02 (×5): 81 mg via ORAL
  Filled 2018-01-29 (×5): qty 1

## 2018-01-29 MED ORDER — BUPROPION HCL ER (XL) 150 MG PO TB24
300.0000 mg | ORAL_TABLET | Freq: Every day | ORAL | Status: DC
  Administered 2018-01-29 – 2018-02-02 (×5): 300 mg via ORAL
  Filled 2018-01-29 (×5): qty 2

## 2018-01-29 MED ORDER — IPRATROPIUM BROMIDE 0.02 % IN SOLN
0.5000 mg | Freq: Once | RESPIRATORY_TRACT | Status: AC
  Administered 2018-01-29: 0.5 mg via RESPIRATORY_TRACT
  Filled 2018-01-29: qty 2.5

## 2018-01-29 MED ORDER — IPRATROPIUM-ALBUTEROL 0.5-2.5 (3) MG/3ML IN SOLN
3.0000 mL | Freq: Four times a day (QID) | RESPIRATORY_TRACT | Status: DC
  Administered 2018-01-29: 3 mL via RESPIRATORY_TRACT
  Filled 2018-01-29: qty 3

## 2018-01-29 MED ORDER — SODIUM CHLORIDE 0.9 % IV SOLN
1.0000 g | INTRAVENOUS | Status: DC
  Administered 2018-01-29: 1 g via INTRAVENOUS
  Filled 2018-01-29: qty 10

## 2018-01-29 MED ORDER — ACETAMINOPHEN 325 MG PO TABS: 650.0000 mg | ORAL_TABLET | Freq: Four times a day (QID) | ORAL | Status: DC | PRN

## 2018-01-29 MED ORDER — METHYLPREDNISOLONE SODIUM SUCC 125 MG IJ SOLR
80.0000 mg | Freq: Four times a day (QID) | INTRAMUSCULAR | Status: DC
  Administered 2018-01-29 – 2018-01-30 (×3): 80 mg via INTRAVENOUS
  Filled 2018-01-29 (×3): qty 2

## 2018-01-29 MED ORDER — ENOXAPARIN SODIUM 40 MG/0.4ML ~~LOC~~ SOLN
40.0000 mg | SUBCUTANEOUS | Status: DC
  Administered 2018-01-29: 40 mg via SUBCUTANEOUS
  Filled 2018-01-29: qty 0.4

## 2018-01-29 MED ORDER — SODIUM CHLORIDE 0.9 % IV BOLUS
1000.0000 mL | Freq: Once | INTRAVENOUS | Status: AC
  Administered 2018-01-29: 1000 mL via INTRAVENOUS

## 2018-01-29 MED ORDER — HYDROCHLOROTHIAZIDE 25 MG PO TABS
25.0000 mg | ORAL_TABLET | Freq: Every day | ORAL | Status: DC
  Administered 2018-01-29 – 2018-02-02 (×5): 25 mg via ORAL
  Filled 2018-01-29 (×5): qty 1

## 2018-01-29 MED ORDER — ALBUTEROL (5 MG/ML) CONTINUOUS INHALATION SOLN
15.0000 mg/h | INHALATION_SOLUTION | Freq: Once | RESPIRATORY_TRACT | Status: AC
  Administered 2018-01-29: 15 mg/h via RESPIRATORY_TRACT
  Filled 2018-01-29: qty 20

## 2018-01-29 MED ORDER — ALBUTEROL SULFATE (2.5 MG/3ML) 0.083% IN NEBU: 2.5000 mg | INHALATION_SOLUTION | Freq: Four times a day (QID) | RESPIRATORY_TRACT | Status: DC | PRN

## 2018-01-29 MED ORDER — VITAMIN D (ERGOCALCIFEROL) 1.25 MG (50000 UNIT) PO CAPS
100000.0000 [IU] | ORAL_CAPSULE | ORAL | Status: DC
  Administered 2018-01-30: 100000 [IU] via ORAL
  Filled 2018-01-29: qty 2

## 2018-01-29 MED ORDER — FLUTICASONE FUROATE-VILANTEROL 100-25 MCG/INH IN AEPB
1.0000 | INHALATION_SPRAY | Freq: Every day | RESPIRATORY_TRACT | Status: DC
  Administered 2018-01-30 – 2018-02-02 (×4): 1 via RESPIRATORY_TRACT
  Filled 2018-01-29: qty 28

## 2018-01-29 MED ORDER — MAGNESIUM SULFATE 2 GM/50ML IV SOLN
2.0000 g | Freq: Once | INTRAVENOUS | Status: AC
  Administered 2018-01-29: 2 g via INTRAVENOUS
  Filled 2018-01-29: qty 50

## 2018-01-29 MED ORDER — POLYETHYLENE GLYCOL 3350 17 G PO PACK
17.0000 g | PACK | Freq: Every day | ORAL | Status: DC | PRN
  Administered 2018-02-02: 17 g via ORAL
  Filled 2018-01-29: qty 1

## 2018-01-29 MED ORDER — ACETAMINOPHEN 650 MG RE SUPP: 650.0000 mg | Freq: Four times a day (QID) | RECTAL | Status: DC | PRN

## 2018-01-29 MED ORDER — IPRATROPIUM-ALBUTEROL 0.5-2.5 (3) MG/3ML IN SOLN: 3.0000 mL | RESPIRATORY_TRACT | Status: DC | PRN

## 2018-01-29 MED ORDER — BENAZEPRIL-HYDROCHLOROTHIAZIDE 20-25 MG PO TABS: 1.0000 | ORAL_TABLET | Freq: Every day | ORAL | Status: DC

## 2018-01-29 MED ORDER — METHYLPREDNISOLONE SODIUM SUCC 125 MG IJ SOLR
125.0000 mg | Freq: Once | INTRAMUSCULAR | Status: AC
  Administered 2018-01-29: 125 mg via INTRAVENOUS
  Filled 2018-01-29: qty 2

## 2018-01-29 MED ORDER — LORATADINE 10 MG PO TABS
10.0000 mg | ORAL_TABLET | Freq: Every day | ORAL | Status: DC
  Administered 2018-01-30 – 2018-02-02 (×4): 10 mg via ORAL
  Filled 2018-01-29 (×4): qty 1

## 2018-01-29 MED ORDER — BISOPROLOL FUMARATE 10 MG PO TABS
10.0000 mg | ORAL_TABLET | Freq: Every day | ORAL | Status: DC
  Administered 2018-01-29 – 2018-02-02 (×5): 10 mg via ORAL
  Filled 2018-01-29 (×5): qty 1

## 2018-01-29 MED ORDER — MAGNESIUM OXIDE 400 (241.3 MG) MG PO TABS
400.0000 mg | ORAL_TABLET | Freq: Every day | ORAL | Status: DC
  Administered 2018-01-29 – 2018-02-02 (×5): 400 mg via ORAL
  Filled 2018-01-29: qty 1
  Filled 2018-01-29: qty 2
  Filled 2018-01-29 (×4): qty 1

## 2018-01-29 NOTE — ED Provider Notes (Signed)
[facility] EMERGENCY DEPARTMENT Provider Note   CSN: [id number] Arrival date & time: 01/29/18  1403     History   Chief Complaint No chief complaint on file.   HPI Robert [name] is a 63 y.o. male hx of HL, HTN, severe COPD but still smoking, here with SOB, cough, subjective fevers.  Patient states that he does smoke 3 to 4 packs/day at baseline.  She he states that he has severe COPD and sees pulmonology.  He states that since yesterday he started having some shortness of breath and productive cough.  He also has some subjective chills as well.  Patient does not wear oxygen at home and was noted to be very tachypneic in triage.  His initial oxygen saturation was 98% but then he was noted to be very hypoxic about 40% in triage.  Patient was put on nonrebreather and brought back to the room for evaluation.  The history is provided by the patient.    Past Medical History:  Diagnosis Date  . 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 Elevated hemoglobin A1c   . Elevated PSA 03/16/2016  . Hyperlipidemia   . Hypertension   . Personal history of colonic adenomas 12/28/2007  . Pre-diabetes   . Prostate cancer ([place]) 03/16/2016  . Vitamin D deficiency     Patient Active Problem List   Diagnosis Date Noted  . FH: hypertension 09/19/2017  . COPD, severe ([place]) 08/09/2016  . Prediabetes 07/22/2016  . Smoker   . Prostate cancer ([place]) 06/28/2016  . Malignant neoplasm of prostate ([place]) 05/20/2016  . Medication management 07/03/2014  . Encounter for long-term (current) use of medications 12/18/2013  . Obesity (BMI 35.55) 03/19/2013  . Testosterone deficiency 12/05/2012  . Essential hypertension   . Hyperlipidemia, mixed   . Vitamin D deficiency   . Elevated hemoglobin A1c   . Personal history of colonic adenomas 12/28/2007    Past Surgical History:  Procedure Laterality Date  . COLONOSCOPY    . LYMPHADENECTOMY N/A 06/28/2016   Procedure: LYMPHADENECTOMY;  Surgeon:  Raynelle Bring, MD;  Location: [facility];  Service: Urology;  Laterality: N/A;  . PROSTATE BIOPSY  03/16/2016   Surgical Pathology Gross and Microscopic Examination for Prostate Needle  . ROBOT ASSISTED LAPAROSCOPIC RADICAL PROSTATECTOMY N/A 06/28/2016   Procedure: XI ROBOTIC ASSISTED LAPAROSCOPIC RADICAL PROSTATECTOMY LEVEL 2 EXCISION OF PENILE LESION;  Surgeon: Raynelle Bring, MD;  Location: [facility];  Service: Urology;  Laterality: N/A;        Home Medications    Prior to Admission medications   Medication Sig Start Date End Date Taking? Authorizing Provider  albuterol (PROAIR HFA) 108 (90 Base) MCG/ACT inhaler Inhale 2 puffs into the lungs every 6 (six) hours as needed for wheezing or shortness of breath. 08/09/16  Yes Parrett, Tammy S, NP  aspirin EC 81 MG tablet Take 81 mg by mouth daily.   Yes [provider]  benazepril-hydrochlorthiazide (LOTENSIN HCT) 20-25 MG tablet TAKE ONE TABLET BY MOUTH DAILY FOR BLOOD PRESSURE Patient taking differently: Take 1 tablet by mouth daily.  01/26/18  Yes Unk [name], MD  bisoprolol-hydrochlorothiazide (ZIAC) 10-6.25 MG tablet TAKE ONE TABLET BY MOUTH DAILY Patient taking differently: Take 1 tablet by mouth daily.  12/24/17  Yes Unk [name], MD  buPROPion (WELLBUTRIN XL) 300 MG 24 hr tablet TAKE ONE TABLET BY MOUTH DAILY Patient taking differently: Take 300 mg by mouth daily.  01/26/18  Yes Unk [name], MD  fluticasone furoate-vilanterol (BREO ELLIPTA) 100-25 MCG/INH  AEPB INHALE ONE DOSE INTO THE LUNGS  DAILY Patient taking differently: Inhale 1 puff into the lungs daily.  12/27/17  Yes Mannam, Praveen, MD  Magnesium 500 MG TABS Take 500 mg by mouth daily.   Yes [provider]  Vitamin D, Ergocalciferol, (DRISDOL) 50000 units CAPS capsule Take 100,000 Units by mouth every 7 (seven) days.    Yes [provider]    Family History Family History  Problem Relation Age of Onset  . Cancer Mother        lung  .  Lung disease Neg Hx     Social History Social History   Tobacco Use  . Smoking status: Current Every Day Smoker    Packs/day: 0.75    Years: 40.00    Pack years: 30.00    Types: Cigarettes  . Smokeless tobacco: Never Used  . Tobacco comment: Peak rate of 2ppd. Has never attempted to quit.   Substance Use Topics  . Alcohol use: Yes    Alcohol/week: 4.0 standard drinks    Types: 4 Cans of beer per week  . Drug use: No     Allergies   Codeine and Isovue [iopamidol]   Review of Systems Review of Systems  Respiratory: Positive for cough and shortness of breath.   All other systems reviewed and are negative.    Physical Exam Updated Vital Signs BP (!) 168/94   Pulse (!) 122   Temp 97.6 F (36.4 C) (Oral)   Resp (!) 24   Ht 5\' 6"  (1.676 m)   Wt 104.3 kg   SpO2 97%   BMI 37.12 kg/m   Physical Exam Vitals signs and nursing note reviewed.  Constitutional:      Comments: Tachypneic, some abdominal breathing   HENT:     Head: Normocephalic.     Mouth/Throat:     Mouth: Mucous membranes are moist.  Eyes:     Extraocular Movements: Extraocular movements intact.     Pupils: Pupils are equal, round, and reactive to light.  Neck:     Musculoskeletal: Normal range of motion.  Cardiovascular:     Rate and Rhythm: Normal rate and regular rhythm.  Pulmonary:     Comments: Tachypneic, mild retractions, some abdominal breathing. Rhonchi and wheezing throughout  Abdominal:     General: Abdomen is flat.     Palpations: Abdomen is soft.  Musculoskeletal: Normal range of motion.     Comments: 1+ edema bilateral legs   Skin:    General: Skin is warm.     Capillary Refill: Capillary refill takes less than 2 seconds.  Neurological:     General: No focal deficit present.  Psychiatric:        Mood and Affect: Mood normal.      ED Treatments / Results  Labs (all labs ordered are listed, but only abnormal results are displayed) Labs Reviewed  COMPREHENSIVE METABOLIC  PANEL - Abnormal; Notable for the following components:      Result Value   Sodium 131 (*)    Chloride 93 (*)    Glucose, Bld 142 (*)    Calcium 8.6 (*)    All other components within normal limits  CBC WITH DIFFERENTIAL/PLATELET - Abnormal; Notable for the following components:   WBC 12.2 (*)    Hemoglobin 17.9 (*)    HCT 54.2 (*)    Neutro Abs 9.6 (*)    Monocytes Absolute 1.4 (*)    All other components within normal limits  I-STAT  CHEM 8, ED - Abnormal; Notable for the following components:   Sodium 130 (*)    Chloride 91 (*)    Glucose, Bld 139 (*)    Calcium, Ion 1.07 (*)    Hemoglobin 17.7 (*)    All other components within normal limits  I-STAT ARTERIAL BLOOD GAS, ED - Abnormal; Notable for the following components:   pH, Arterial 7.265 (*)    pCO2 arterial 66.2 (*)    pO2, Arterial 122.0 (*)    Bicarbonate 30.0 (*)    All other components within normal limits  CULTURE, BLOOD (ROUTINE X 2)  CULTURE, BLOOD (ROUTINE X 2)  URINALYSIS, ROUTINE W REFLEX MICROSCOPIC  BRAIN NATRIURETIC PEPTIDE  INFLUENZA PANEL BY PCR (TYPE A & B)  BLOOD GAS, ARTERIAL  I-STAT CG4 LACTIC ACID, ED  I-STAT TROPONIN, ED  I-STAT CG4 LACTIC ACID, ED    EKG EKG Interpretation  Date/Time:  Sunday January 29 2018 14:12:16 EST Ventricular Rate:  89 PR Interval:    QRS Duration: 104 QT Interval:  368 QTC Calculation: 448 R Axis:   -79 Text Interpretation:  Sinus rhythm Incomplete RBBB and LAFB Anteroseptal infarct, age indeterminate No significant change since last tracing Confirmed by Wandra Arthurs ([id number]) on 01/29/2018 2:17:00 PM   Radiology Dg Chest Port 1 View  Result Date: 01/29/2018 CLINICAL DATA:  Shortness of breath and chest pain EXAM: PORTABLE CHEST 1 VIEW COMPARISON:  August 09, 2016 FINDINGS: There is no appreciable edema or consolidation. The heart size and pulmonary vascularity are normal. No evident adenopathy. No bone lesions. No pneumothorax. IMPRESSION: No edema or  consolidation. Electronically Signed   By: Lowella Grip III M.D.   On: 01/29/2018 14:28    Procedures Procedures (including critical care time)  CRITICAL CARE Performed by: Wandra Arthurs   Total critical care time: 30  minutes  Critical care time was exclusive of separately billable procedures and treating other patients.  Critical care was necessary to treat or prevent imminent or life-threatening deterioration.  Critical care was time spent personally by me on the following activities: development of treatment plan with patient and/or surrogate as well as nursing, discussions with consultants, evaluation of patient's response to treatment, examination of patient, obtaining history from patient or surrogate, ordering and performing treatments and interventions, ordering and review of laboratory studies, ordering and review of radiographic studies, pulse oximetry and re-evaluation of patient's condition.   Medications Ordered in ED Medications  magnesium sulfate IVPB 2 g 50 mL (2 g Intravenous New Bag/Given 01/29/18 1509)  methylPREDNISolone sodium succinate (SOLU-MEDROL) 125 mg/2 mL injection 125 mg (125 mg Intravenous Given 01/29/18 1504)  albuterol (PROVENTIL,VENTOLIN) solution continuous neb (15 mg/hr Nebulization Given 01/29/18 1427)  ipratropium (ATROVENT) nebulizer solution 0.5 mg (0.5 mg Nebulization Given 01/29/18 1427)  sodium chloride 0.9 % bolus 1,000 mL (1,000 mLs Intravenous New Bag/Given 01/29/18 1508)     Initial Impression / Assessment and Plan / ED Course  I have reviewed the triage vital signs and the nursing notes.  Pertinent labs & imaging results that were available during my care of the patient were reviewed by me and considered in my medical decision making (see chart for details).    KAUAN KLOOSTERMAN is a 62 y.o. male here with SOB. Has hx of COPD and still smoking. Also has subjective chills and noted to be hypoxic in the ED. I think likely COPD exacerbation vs  pneumonia vs flu. Will do sepsis workup. Will give nebs, steroids, magnesium.  Will get ABG and will need admission.   3:20 pm ABG showed pH 7.26, PCO2 66. Breathing improved on continuous neb. CXR clear. Lactate nl. Flu sent. Held abx. He is on 5 L [place] and O2 is around 95%. Will admit to stepdown for COPD exacerbation with hypoxia and hypercarbia.    Final Clinical Impressions(s) / ED Diagnoses   Final diagnoses:  COPD exacerbation [facility])    ED Discharge Orders    None       Drenda Freeze, MD 01/29/18 1550

## 2018-01-29 NOTE — H&P (Addendum)
History and Physical    DOA: 01/29/2018  PCP: Unk [name], MD  Patient coming from: Home  Chief Complaint: Shortness of breath with flulike symptoms  HPI: Robert [name] is a 63 y.o. male with history h/o COPD, hypertension, hyperlipidemia, prostate cancer who continues to smoke cigarettes, a pack a day presents today to the ED with worsening dyspnea.  Patient reports upper respiratory symptoms with nasal congestion and cough for the last 3 days.  Yesterday he started having shortness of breath which worsened this morning with wheezing and brought him to the ED.  Patient denies any fever or chills.  He denies using home oxygen.  He states he did not notice the color of the phlegm.  He can apparently walk more than a block at baseline without dyspnea.  Patient was noted to be hypoxic (with O2 sat?  Down to 50%) while in the waiting area.  He was placed on 100% NRBM and brought back into the ED room for further evaluation.  He received continuous neb treatments, magnesium and Solu-Medrol. ABG showed pH of 7.26 and PCO2 of 66 with PO2 of122.  He is now down to 5 L nasal cannula and saturating at 93%.   Patient requested to be admitted for further evaluation and management.   Review of Systems: As per HPI otherwise 10 point review of systems negative.    Past Medical History:  Diagnosis Date  . 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 Elevated hemoglobin A1c   . Elevated PSA 03/16/2016  . Hyperlipidemia   . Hypertension   . Personal history of colonic adenomas 12/28/2007  . Pre-diabetes   . Prostate cancer ([place]) 03/16/2016  . Vitamin D deficiency     Past Surgical History:  Procedure Laterality Date  . COLONOSCOPY    . LYMPHADENECTOMY N/A 06/28/2016   Procedure: LYMPHADENECTOMY;  Surgeon: Raynelle Bring, MD;  Location: [facility];  Service: Urology;  Laterality: N/A;  . PROSTATE BIOPSY  03/16/2016   Surgical Pathology Gross and Microscopic Examination for Prostate Needle  . ROBOT  ASSISTED LAPAROSCOPIC RADICAL PROSTATECTOMY N/A 06/28/2016   Procedure: XI ROBOTIC ASSISTED LAPAROSCOPIC RADICAL PROSTATECTOMY LEVEL 2 EXCISION OF PENILE LESION;  Surgeon: Raynelle Bring, MD;  Location: [facility];  Service: Urology;  Laterality: N/A;    Social history:  reports that he has been smoking cigarettes. He has a 30.00 pack-year smoking history. He has never used smokeless tobacco. He reports current alcohol use of about 4.0 standard drinks of alcohol per week. He reports that he does not use drugs.   Allergies  Allergen Reactions  . Codeine Other (See Comments)    REACTION: Itching  . Isovue [Iopamidol] Hives    Pt. Developed 1 hive after injection; Dr. Kris Hartmann spoke with patient;recommends 13 hr prep in the future    Family History  Problem Relation Age of Onset  . Cancer Mother        lung  . Lung disease Neg Hx       Prior to Admission medications   Medication Sig Start Date End Date Taking? Authorizing Provider  albuterol (PROAIR HFA) 108 (90 Base) MCG/ACT inhaler Inhale 2 puffs into the lungs every 6 (six) hours as needed for wheezing or shortness of breath. 08/09/16  Yes Parrett, Tammy S, NP  aspirin EC 81 MG tablet Take 81 mg by mouth daily.   Yes [provider]  benazepril-hydrochlorthiazide (LOTENSIN HCT) 20-25 MG tablet TAKE ONE TABLET BY MOUTH DAILY FOR BLOOD PRESSURE Patient taking differently: Take  1 tablet by mouth daily.  01/26/18  Yes Unk [name], MD  bisoprolol-hydrochlorothiazide (ZIAC) 10-6.25 MG tablet TAKE ONE TABLET BY MOUTH DAILY Patient taking differently: Take 1 tablet by mouth daily.  12/24/17  Yes Unk [name], MD  buPROPion (WELLBUTRIN XL) 300 MG 24 hr tablet TAKE ONE TABLET BY MOUTH DAILY Patient taking differently: Take 300 mg by mouth daily.  01/26/18  Yes Unk [name], MD  fluticasone furoate-vilanterol (BREO ELLIPTA) 100-25 MCG/INH AEPB INHALE ONE DOSE INTO THE LUNGS  DAILY Patient taking differently: Inhale 1 puff into the  lungs daily.  12/27/17  Yes Mannam, Praveen, MD  Magnesium 500 MG TABS Take 500 mg by mouth daily.   Yes [provider]  Vitamin D, Ergocalciferol, (DRISDOL) 50000 units CAPS capsule Take 100,000 Units by mouth every 7 (seven) days.    Yes [provider]    Physical Exam: Vitals:   01/29/18 1445 01/29/18 1533 01/29/18 1630 01/29/18 1700  BP: (!) 178/78 (!) 168/94 137/74 132/75  Pulse: 91 (!) 122 (!) 115 (!) 115  Resp: (!) 29 (!) 24 (!) 22 (!) 22  Temp:      TempSrc:      SpO2: 98% 97% 95% 94%  Weight:      Height:        Constitutional: NAD, calm, comfortable Eyes: PERRL, lids and conjunctivae normal ENMT: Mucous membranes are moist. Posterior pharynx clear of any exudate or lesions.Normal dentition.  Neck: normal, supple, no masses, no thyromegaly Respiratory: Scattered rhonchi on auscultation bilaterally, no wheezing, no crackles.  Mild respiratory distress with accessory muscle use while talking full sentences.  Cardiovascular: Regular rate and rhythm, no murmurs / rubs / gallops. No extremity edema. 2+ pedal pulses. No carotid bruits.  Abdomen: Obese, no tenderness, no masses palpated. No hepatosplenomegaly. Bowel sounds positive.  Musculoskeletal: no clubbing / cyanosis. No joint deformity upper and lower extremities. Good ROM, no contractures. Normal muscle tone.  Neurologic: CN 2-12 grossly intact. Sensation intact, DTR normal. Strength 5/5 in all 4.  Psychiatric: Normal judgment and insight. Alert and oriented x 3. Normal mood.  SKIN/catheters: no rashes, lesions, ulcers. No induration  Labs on Admission: I have personally reviewed following labs and imaging studies  CBC: Recent Labs  Lab 01/29/18 1438 01/29/18 1455  WBC 12.2*  --   NEUTROABS 9.6*  --   HGB 17.9* 17.7*  HCT 54.2* 52.0  MCV 98.7  --   PLT 179  --    Basic Metabolic Panel: Recent Labs  Lab 01/29/18 1438 01/29/18 1455  NA 131* 130*  K 4.4 4.8  CL 93* 91*  CO2 26  --     GLUCOSE 142* 139*  BUN 9 16  CREATININE 0.76 0.80  CALCIUM 8.6*  --    GFR: Estimated Creatinine Clearance: 111.1 mL/min (by C-G formula based on SCr of 0.8 mg/dL). Liver Function Tests: Recent Labs  Lab 01/29/18 1438  AST 26  ALT 21  ALKPHOS 64  BILITOT 0.6  PROT 7.8  ALBUMIN 3.8   No results for input(s): LIPASE, AMYLASE in the last 168 hours. No results for input(s): AMMONIA in the last 168 hours. Coagulation Profile: No results for input(s): INR, PROTIME in the last 168 hours. Cardiac Enzymes: No results for input(s): CKTOTAL, CKMB, CKMBINDEX, TROPONINI in the last 168 hours. BNP (last 3 results) No results for input(s): PROBNP in the last 8760 hours. HbA1C: No results for input(s): HGBA1C in the last 72 hours. CBG: No results for input(s): GLUCAP in  the last 168 hours. Lipid Profile: No results for input(s): CHOL, HDL, LDLCALC, TRIG, CHOLHDL, LDLDIRECT in the last 72 hours. Thyroid Function Tests: No results for input(s): TSH, T4TOTAL, FREET4, T3FREE, THYROIDAB in the last 72 hours. Anemia Panel: No results for input(s): VITAMINB12, FOLATE, FERRITIN, TIBC, IRON, RETICCTPCT in the last 72 hours. Urine analysis: No results found for: COLORURINE, APPEARANCEUR, LABSPEC, PHURINE, GLUCOSEU, HGBUR, BILIRUBINUR, KETONESUR, PROTEINUR, UROBILINOGEN, NITRITE, LEUKOCYTESUR  Radiological Exams on Admission: Dg Chest Port 1 View  Result Date: 01/29/2018 CLINICAL DATA:  Shortness of breath and chest pain EXAM: PORTABLE CHEST 1 VIEW COMPARISON:  August 09, 2016 FINDINGS: There is no appreciable edema or consolidation. The heart size and pulmonary vascularity are normal. No evident adenopathy. No bone lesions. No pneumothorax. IMPRESSION: No edema or consolidation. Electronically Signed   By: Lowella Grip III M.D.   On: 01/29/2018 14:28    EKG: Independently reviewed.  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  No acute changes     Assessment and  Plan:   1.  COPD exacerbation with acute hypoxic/hypercarbic respiratory failure POA: ABG shows respiratory acidosis.  Admit with IV steroids, antihistamines, antitussives, nebulizer treatments and empiric antibiotics.  Flu test is negative.  Patient currently saturating at 93% on 5 L.  Taper O2 as tolerated.  Counseled to quit smoking.  May need home O2 evaluation prior to discharge although reports good exercise tolerance at baseline  2.  Hypertension: Blood pressure currently elevated.  Resume home medications.  Hydralazine as needed  3.  Hyperlipidemia: Resume home medications  4.  Tobacco use: He does not feel he needs nicotine patch currently.  Understands the risks of lung cancer and atherosclerosis/fatal complications with ongoing tobacco use.  DVT prophylaxis: Lovenox  Code Status: Full code  Family Communication: Discussed with patient and niece bedside. Health care proxy would be his son Cherre Blanc called: None Admission status:  Patient admitted as inpatient as anticipated LOS greater than 2 midnights    Guilford Shi MD [facility] Pager [phone]  If 7PM-7AM, please contact night-coverage [email or web address] Password [facility]  01/29/2018, 5:57 PM

## 2018-01-29 NOTE — ED Notes (Signed)
Jenny Reichmann - RN aware of pt's vitals.

## 2018-01-29 NOTE — ED Triage Notes (Signed)
Pt here with c/o with sob, fever, and chills.  Pt initial SP02 37%, placed on non-rebreather 15L  Pt oxygen sat currently 100%. Pt mentation at baseline.

## 2018-01-30 DIAGNOSIS — J9601 Acute respiratory failure with hypoxia: Secondary | ICD-10-CM

## 2018-01-30 DIAGNOSIS — J9602 Acute respiratory failure with hypercapnia: Secondary | ICD-10-CM

## 2018-01-30 LAB — CBC
HCT: 48 % (ref 39.0–52.0)
Hemoglobin: 16.2 g/dL (ref 13.0–17.0)
MCH: 33.4 pg (ref 26.0–34.0)
MCHC: 33.8 g/dL (ref 30.0–36.0)
MCV: 99 fL (ref 80.0–100.0)
PLATELETS: 158 10*3/uL (ref 150–400)
RBC: 4.85 MIL/uL (ref 4.22–5.81)
RDW: 12.3 % (ref 11.5–15.5)
WBC: 10.4 10*3/uL (ref 4.0–10.5)
nRBC: 0 % (ref 0.0–0.2)

## 2018-01-30 LAB — URINALYSIS, ROUTINE W REFLEX MICROSCOPIC
Bilirubin Urine: NEGATIVE
Glucose, UA: >=500 mg/dL — AB
KETONES UR: 5 mg/dL — AB
Leukocytes, UA: NEGATIVE
Nitrite: NEGATIVE
Protein, ur: 100 mg/dL — AB
Specific Gravity, Urine: 1.023 (ref 1.005–1.030)
pH: 6 (ref 5.0–8.0)

## 2018-01-30 LAB — BASIC METABOLIC PANEL
Anion gap: 9 (ref 5–15)
BUN: 12 mg/dL (ref 6–20)
CO2: 30 mmol/L (ref 22–32)
Calcium: 8.4 mg/dL — ABNORMAL LOW (ref 8.9–10.3)
Chloride: 95 mmol/L — ABNORMAL LOW (ref 98–111)
Creatinine, Ser: 0.97 mg/dL (ref 0.61–1.24)
GFR calc non Af Amer: >60 mL/min (ref >60)
Glucose, Bld: 201 mg/dL — ABNORMAL HIGH (ref 70–99)
Potassium: 3.9 mmol/L (ref 3.5–5.1)
Sodium: 134 mmol/L — ABNORMAL LOW (ref 135–145)

## 2018-01-30 LAB — HIV ANTIBODY (ROUTINE TESTING W REFLEX): HIV Screen 4th Generation wRfx: NONREACTIVE

## 2018-01-30 MED ORDER — METHYLPREDNISOLONE SODIUM SUCC 125 MG IJ SOLR
60.0000 mg | Freq: Three times a day (TID) | INTRAMUSCULAR | Status: DC
  Administered 2018-01-30 – 2018-01-31 (×3): 60 mg via INTRAVENOUS
  Filled 2018-01-30 (×3): qty 2

## 2018-01-30 MED ORDER — AZITHROMYCIN 250 MG PO TABS
500.0000 mg | ORAL_TABLET | Freq: Every day | ORAL | Status: DC
  Administered 2018-01-30 – 2018-02-02 (×4): 500 mg via ORAL
  Filled 2018-01-30 (×4): qty 2

## 2018-01-30 NOTE — Progress Notes (Signed)
Patient ID: Robert [name], male   DOB: [birth year], 63 y.o.   MRN: [id number]  PROGRESS NOTE    ESTEFAN PATTISON  [id number]:[id number] DOB: April 05, 1955 DOA: 01/29/2018 PCP: Unk [name], MD   Brief Narrative:  63 year old male with history of COPD, hypertension, hyperlipidemia, prostate cancer, tobacco abuse presented with shortness of breath and flulike symptoms.  He was found to be hypoxic and admitted for COPD exacerbation and started on intravenous Solu-Medrol.   Assessment & Plan:   Active Problems:   COPD exacerbation (HCC)   Acute hypoxic/hypercarbic respiratory failure -From COPD exacerbation -Improving.  Currently on 2 L oxygen via nasal cannula but still short of breath.  COPD exacerbation -Improving.  Still wheezing.  Decrease Solu-Medrol to 60 mg IV every 8 hours.  Continue duo nebs along with fluticasone/Vilanterol -Wean off oxygen as able.  Outpatient follow-up with pulmonary -Discontinue Rocephin.  Start oral Zithromax  Hypertension -Monitor blood pressure.  Continue benazepril, bisoprolol and hydrochlorothiazide.  Tobacco abuse -Counseled about tobacco cessation.    DVT prophylaxis: Lovenox Code Status: Full Family Communication: Spoke to patient and family at bedside Disposition Plan:  Consultants: None  Procedures: None  Antimicrobials: Rocephin   Subjective: Patient seen and examined at bedside.  No overnight fever, nausea or vomiting.  Still feels short of breath but slightly better.  Cough is slightly improved  Objective: Vitals:   01/29/18 2052 01/29/18 2127 01/30/18 0729 01/30/18 0736  BP:  (!) 148/62  (!) 97/56  Pulse:  95  67  Resp:  20  17  Temp:    98.8 F (37.1 C)  TempSrc:    Oral  SpO2: 95% 98% 97%   Weight:      Height:        Intake/Output Summary (Last 24 hours) at 01/30/2018 8101 Last data filed at 01/30/2018 7510 Gross per 24 hour  Intake 1100.52 ml  Output 825 ml  Net 275.52 ml   Filed Weights   01/29/18 1419  Weight:  104.3 kg    Examination:  General exam: Appears calm and comfortable  Respiratory system: Bilateral decreased breath sounds at bases with diffuse scattered wheezing Cardiovascular system: S1 & S2 heard, Rate controlled Gastrointestinal system: Abdomen is nondistended, soft and nontender. Normal bowel sounds heard. Extremities: No cyanosis, clubbing, edema   Data Reviewed: I have personally reviewed following labs and imaging studies  CBC: Recent Labs  Lab 01/29/18 1438 01/29/18 1455 01/30/18 0247  WBC 12.2*  --  10.4  NEUTROABS 9.6*  --   --   HGB 17.9* 17.7* 16.2  HCT 54.2* 52.0 48.0  MCV 98.7  --  99.0  PLT 179  --  258   Basic Metabolic Panel: Recent Labs  Lab 01/29/18 1438 01/29/18 1455 01/30/18 0247  NA 131* 130* 134*  K 4.4 4.8 3.9  CL 93* 91* 95*  CO2 26  --  30  GLUCOSE 142* 139* 201*  BUN 9 16 12   CREATININE 0.76 0.80 0.97  CALCIUM 8.6*  --  8.4*   GFR: Estimated Creatinine Clearance: 91.6 mL/min (by C-G formula based on SCr of 0.97 mg/dL). Liver Function Tests: Recent Labs  Lab 01/29/18 1438  AST 26  ALT 21  ALKPHOS 64  BILITOT 0.6  PROT 7.8  ALBUMIN 3.8   No results for input(s): LIPASE, AMYLASE in the last 168 hours. No results for input(s): AMMONIA in the last 168 hours. Coagulation Profile: No results for input(s): INR, PROTIME in the last 168 hours. Cardiac Enzymes: No  results for input(s): CKTOTAL, CKMB, CKMBINDEX, TROPONINI in the last 168 hours. BNP (last 3 results) No results for input(s): PROBNP in the last 8760 hours. HbA1C: No results for input(s): HGBA1C in the last 72 hours. CBG: No results for input(s): GLUCAP in the last 168 hours. Lipid Profile: No results for input(s): CHOL, HDL, LDLCALC, TRIG, CHOLHDL, LDLDIRECT in the last 72 hours. Thyroid Function Tests: No results for input(s): TSH, T4TOTAL, FREET4, T3FREE, THYROIDAB in the last 72 hours. Anemia Panel: No results for input(s): VITAMINB12, FOLATE, FERRITIN, TIBC,  IRON, RETICCTPCT in the last 72 hours. Sepsis Labs: Recent Labs  Lab 01/29/18 1455  LATICACIDVEN 1.24    Recent Results (from the past 240 hour(s))  Blood Culture (routine x 2)     Status: None (Preliminary result)   Collection Time: 01/29/18  2:39 PM  Result Value Ref Range Status   Specimen Description BLOOD RIGHT ANTECUBITAL  Final   Special Requests   Final    BOTTLES DRAWN AEROBIC AND ANAEROBIC Blood Culture adequate volume   Culture   Final    NO GROWTH < 24 HOURS Performed at [facility], [place] [address] [place], [place] [address]    Report Status PENDING  Incomplete  Blood Culture (routine x 2)     Status: None (Preliminary result)   Collection Time: 01/29/18  3:03 PM  Result Value Ref Range Status   Specimen Description BLOOD LEFT ANTECUBITAL  Final   Special Requests   Final    BOTTLES DRAWN AEROBIC AND ANAEROBIC Blood Culture adequate volume   Culture   Final    NO GROWTH < 24 HOURS Performed at [facility], [place] [address], [place], [place] [address]    Report Status PENDING  Incomplete         Radiology Studies: Dg Chest Port 1 View  Result Date: 01/29/2018 CLINICAL DATA:  Shortness of breath and chest pain EXAM: PORTABLE CHEST 1 VIEW COMPARISON:  August 09, 2016 FINDINGS: There is no appreciable edema or consolidation. The heart size and pulmonary vascularity are normal. No evident adenopathy. No bone lesions. No pneumothorax. IMPRESSION: No edema or consolidation. Electronically Signed   By: Lowella Grip III M.D.   On: 01/29/2018 14:28        Scheduled Meds: . aspirin EC  81 mg Oral Daily  . benazepril  20 mg Oral Daily  . bisoprolol  10 mg Oral Daily  . buPROPion  300 mg Oral Daily  . enoxaparin (LOVENOX) injection  40 mg Subcutaneous Q24H  . fluticasone furoate-vilanterol  1 puff Inhalation Daily  . hydrochlorothiazide  25 mg Oral Daily  . ipratropium-albuterol  3 mL Nebulization TID  . loratadine  10 mg Oral Daily  . magnesium  oxide  400 mg Oral Daily  . methylPREDNISolone (SOLU-MEDROL) injection  80 mg Intravenous Q6H  . Vitamin D (Ergocalciferol)  100,000 Units Oral Q7 days   Continuous Infusions: . cefTRIAXone (ROCEPHIN)  IV 1 g (01/29/18 2214)     LOS: 1 day        Aline August, MD [facility] Pager [phone]  If 7PM-7AM, please contact night-coverage [email or web address] Password [facility] 01/30/2018, 9:52 AM

## 2018-01-30 NOTE — Progress Notes (Signed)
Assessment and MAR documentation reviewed as completed by Alfonse Ras

## 2018-01-31 MED ORDER — METHYLPREDNISOLONE SODIUM SUCC 40 MG IJ SOLR
40.0000 mg | Freq: Three times a day (TID) | INTRAMUSCULAR | Status: DC
  Administered 2018-01-31 – 2018-02-01 (×3): 40 mg via INTRAVENOUS
  Filled 2018-01-31 (×3): qty 1

## 2018-01-31 NOTE — Progress Notes (Signed)
Patient ID: Robert [name], male   DOB: 06/04/1955, 63 y.o.   MRN: [id number]  PROGRESS NOTE    Robert [name]  [id number]:[id number] DOB: May 11, 1955 DOA: 01/29/2018 PCP: Unk [name], MD   Brief Narrative:  63 year old male with history of COPD, hypertension, hyperlipidemia, prostate cancer, tobacco abuse presented with shortness of breath and flulike symptoms.  He was found to be hypoxic and admitted for COPD exacerbation and started on intravenous Solu-Medrol.   Assessment & Plan:   Active Problems:   COPD exacerbation (HCC)   Acute hypoxic/hypercarbic respiratory failure -From COPD exacerbation -Improving.  Currently on 3 L oxygen via nasal cannula but still short of breath.  COPD exacerbation -Improving.  Wheezing is improving.  Decrease Solu-Medrol to 40 mg IV every 8 hours.  Continue duo nebs along with fluticasone/Vilanterol -Wean off oxygen as able.  Outpatient follow-up with pulmonary -Continue oral Zithromax  Hypertension -Monitor blood pressure.  Continue benazepril, bisoprolol and hydrochlorothiazide.  Tobacco abuse -Counseled about tobacco cessation.    DVT prophylaxis: Lovenox Code Status: Full Family Communication: None at bedside Disposition Plan:  Consultants: None  Procedures: None  Antimicrobials: Zithromax  Subjective: Patient seen and examined at bedside.  Feels slightly better.  Still on oxygen.  Feels short of breath with exertion.  Cough is improving.  No overnight fever or vomiting.  Objective: Vitals:   01/30/18 1928 01/30/18 2243 01/31/18 0753 01/31/18 0830  BP:  119/62  (!) 114/57  Pulse:  71  (!) 57  Resp:  18  10  Temp:  97.9 F (36.6 C)  97.6 F (36.4 C)  TempSrc:  Oral    SpO2: 94% 93% 96% 94%  Weight:      Height:        Intake/Output Summary (Last 24 hours) at 01/31/2018 5885 Last data filed at 01/30/2018 2141 Gross per 24 hour  Intake 637 ml  Output 600 ml  Net 37 ml   Filed Weights   01/29/18 1419  Weight: 104.3 kg     Examination:  General exam: Appears calm and comfortable, no acute distress Respiratory system: Bilateral decreased breath sounds at bases with wheezing improved since yesterday.   Cardiovascular system: S1 & S2 heard, bradycardic intermittently Gastrointestinal system: Abdomen is nondistended, soft and nontender. Normal bowel sounds heard. Extremities: No cyanosis, edema   Data Reviewed: I have personally reviewed following labs and imaging studies  CBC: Recent Labs  Lab 01/29/18 1438 01/29/18 1455 01/30/18 0247  WBC 12.2*  --  10.4  NEUTROABS 9.6*  --   --   HGB 17.9* 17.7* 16.2  HCT 54.2* 52.0 48.0  MCV 98.7  --  99.0  PLT 179  --  027   Basic Metabolic Panel: Recent Labs  Lab 01/29/18 1438 01/29/18 1455 01/30/18 0247  NA 131* 130* 134*  K 4.4 4.8 3.9  CL 93* 91* 95*  CO2 26  --  30  GLUCOSE 142* 139* 201*  BUN 9 16 12   CREATININE 0.76 0.80 0.97  CALCIUM 8.6*  --  8.4*   GFR: Estimated Creatinine Clearance: 91.6 mL/min (by C-G formula based on SCr of 0.97 mg/dL). Liver Function Tests: Recent Labs  Lab 01/29/18 1438  AST 26  ALT 21  ALKPHOS 64  BILITOT 0.6  PROT 7.8  ALBUMIN 3.8   No results for input(s): LIPASE, AMYLASE in the last 168 hours. No results for input(s): AMMONIA in the last 168 hours. Coagulation Profile: No results for input(s): INR, PROTIME in the last 168  hours. Cardiac Enzymes: No results for input(s): CKTOTAL, CKMB, CKMBINDEX, TROPONINI in the last 168 hours. BNP (last 3 results) No results for input(s): PROBNP in the last 8760 hours. HbA1C: No results for input(s): HGBA1C in the last 72 hours. CBG: No results for input(s): GLUCAP in the last 168 hours. Lipid Profile: No results for input(s): CHOL, HDL, LDLCALC, TRIG, CHOLHDL, LDLDIRECT in the last 72 hours. Thyroid Function Tests: No results for input(s): TSH, T4TOTAL, FREET4, T3FREE, THYROIDAB in the last 72 hours. Anemia Panel: No results for input(s): VITAMINB12,  FOLATE, FERRITIN, TIBC, IRON, RETICCTPCT in the last 72 hours. Sepsis Labs: Recent Labs  Lab 01/29/18 1455  LATICACIDVEN 1.24    Recent Results (from the past 240 hour(s))  Blood Culture (routine x 2)     Status: None (Preliminary result)   Collection Time: 01/29/18  2:39 PM  Result Value Ref Range Status   Specimen Description BLOOD RIGHT ANTECUBITAL  Final   Special Requests   Final    BOTTLES DRAWN AEROBIC AND ANAEROBIC Blood Culture adequate volume   Culture   Final    NO GROWTH < 24 HOURS Performed at [facility], [place] [address], [place], [place] [address]    Report Status PENDING  Incomplete  Blood Culture (routine x 2)     Status: None (Preliminary result)   Collection Time: 01/29/18  3:03 PM  Result Value Ref Range Status   Specimen Description BLOOD LEFT ANTECUBITAL  Final   Special Requests   Final    BOTTLES DRAWN AEROBIC AND ANAEROBIC Blood Culture adequate volume   Culture   Final    NO GROWTH < 24 HOURS Performed at [facility], [place] [address], [place], [place] [address]    Report Status PENDING  Incomplete         Radiology Studies: Dg Chest Port 1 View  Result Date: 01/29/2018 CLINICAL DATA:  Shortness of breath and chest pain EXAM: PORTABLE CHEST 1 VIEW COMPARISON:  August 09, 2016 FINDINGS: There is no appreciable edema or consolidation. The heart size and pulmonary vascularity are normal. No evident adenopathy. No bone lesions. No pneumothorax. IMPRESSION: No edema or consolidation. Electronically Signed   By: Lowella Grip III M.D.   On: 01/29/2018 14:28        Scheduled Meds: . aspirin EC  81 mg Oral Daily  . azithromycin  500 mg Oral Daily  . benazepril  20 mg Oral Daily  . bisoprolol  10 mg Oral Daily  . buPROPion  300 mg Oral Daily  . fluticasone furoate-vilanterol  1 puff Inhalation Daily  . hydrochlorothiazide  25 mg Oral Daily  . ipratropium-albuterol  3 mL Nebulization TID  . loratadine  10 mg Oral Daily  . magnesium  oxide  400 mg Oral Daily  . methylPREDNISolone (SOLU-MEDROL) injection  60 mg Intravenous Q8H  . Vitamin D (Ergocalciferol)  100,000 Units Oral Q7 days   Continuous Infusions:    LOS: 2 days        Aline August, MD [facility] Pager [phone]  If 7PM-7AM, please contact night-coverage [email or web address] Password [email or web address] 01/31/2018, 9:06 AM

## 2018-01-31 NOTE — Care Management Note (Addendum)
Case Management Note  Patient Details  Name: Robert [name] MRN: [id number] Date of Birth: 16-Nov-1955  Subjective/Objective:  From home, copd ex weaning oxygen, he has no insurance, but sees Dr. Melford Aase, but would like to start going to clinic .  NCM will schedule  A hospital follow up for patient at the clinic. He may need Match letter at discharge.   NCM gave patient the brochure for the [facility] clinic and can go to [facility] for future med ast,  When patient is discharged the [facility] will fill his scripts , NCM gave Olivia Mackie with [facility] letter and they will bring his meds to him prior to discharge.                Action/Plan: DC home when ready.   Expected Discharge Date:  02/01/18               Expected Discharge Plan:  Home/Self Care  In-House Referral:     Discharge planning Services  CM Consult, [facility], Follow-up appt scheduled, Medication Assistance  Post Acute Care Choice:    Choice offered to:     DME Arranged:   Neb Machine DME Agency:   [facility]  HH Arranged:    [place] Agency:     Status of Service:  In process, will continue to follow  If discussed at Long Length of Stay Meetings, dates discussed:    Additional Comments:  Zenon Mayo, RN 01/31/2018, 3:30 PM

## 2018-02-01 MED ORDER — METHYLPREDNISOLONE SODIUM SUCC 40 MG IJ SOLR: 40.0000 mg | Freq: Three times a day (TID) | INTRAMUSCULAR | Status: DC

## 2018-02-01 MED ORDER — PREDNISONE 20 MG PO TABS
50.0000 mg | ORAL_TABLET | Freq: Every day | ORAL | Status: DC
  Administered 2018-02-02: 50 mg via ORAL
  Filled 2018-02-01: qty 2

## 2018-02-01 NOTE — Progress Notes (Signed)
PROGRESS NOTE    Robert [name]  PYP:[id number] DOB: 02-Sep-1955 DOA: 01/29/2018 PCP: Unk [name], MD     Brief Narrative:  Robert [name] is a 63 year old male with history of COPD, hypertension, hyperlipidemia, prostate cancer, tobacco abuse who presented with shortness of breath and flulike symptoms.  He was found to be hypoxic, admitted for COPD exacerbation.  He was started on IV Solu-Medrol.  New events last 24 hours / Subjective: States that he has been wheezing, breathing has improved since admission but still requiring nasal cannula O2.  Assessment & Plan:   Active Problems:   COPD exacerbation (HCC)   Acute hypoxemic, hypercarbic respiratory failure Slowly improving, currently on 2 L nasal cannula.  Continue to wean to room air.  Home desat screen to see if patient would qualify for home O2  Acute COPD exacerbation Continue to wean Solu-Medrol, start oral prednisone tomorrow a.m. Oral Zithromax for 5 days Continue breathing treatments, inhaler  Hypertension Continue benazepril, bisoprolol, hydrochlorothiazide  Tobacco abuse Tobacco cessation counseling  Depression Continue Wellbutrin   DVT prophylaxis: Lovenox Code Status: Full code Family Communication: No family at bedside Disposition Plan: Continue to wean O2, home desat screen, remain inpatient as patient still requiring nasal cannula oxygen and not quite back to baseline.  Possible discharge home 1/23   Consultants:   None  Procedures:   None  Antimicrobials:  Anti-infectives (From admission, onward)   Start     Dose/Rate Route Frequency Ordered Stop   01/30/18 1015  azithromycin (ZITHROMAX) tablet 500 mg     500 mg Oral Daily 01/30/18 1000 02/04/18 0959   01/29/18 1930  cefTRIAXone (ROCEPHIN) 1 g in sodium chloride 0.9 % 100 mL IVPB  Status:  Discontinued     1 g 200 mL/hr over 30 Minutes Intravenous Every 24 hours 01/29/18 1755 01/30/18 0959       Objective: Vitals:   01/31/18 2119  01/31/18 2320 02/01/18 0748 02/01/18 0802  BP:  131/64  (!) 144/75  Pulse:  (!) 59  (!) 57  Resp:  18    Temp:  97.8 F (36.6 C)  97.9 F (36.6 C)  TempSrc:  Oral    SpO2: 90% 93% 90% 91%  Weight:      Height:        Intake/Output Summary (Last 24 hours) at 02/01/2018 1008 Last data filed at 01/31/2018 2025 Gross per 24 hour  Intake 120 ml  Output -  Net 120 ml   Filed Weights   01/29/18 1419  Weight: 104.3 kg    Examination:  General exam: Appears calm and comfortable  Respiratory system: Diminished breath sounds bilaterally, respiratory effort normal, on 2 L nasal cannula O2 Cardiovascular system: S1 & S2 heard, RRR. No JVD, murmurs, rubs, gallops or clicks. No pedal edema. Gastrointestinal system: Abdomen is nondistended, soft and nontender. No organomegaly or masses felt. Normal bowel sounds heard. Central nervous system: Alert and oriented. No focal neurological deficits. Extremities: Symmetric 5 x 5 power. Skin: No rashes, lesions or ulcers Psychiatry: Judgement and insight appear normal. Mood & affect appropriate.   Data Reviewed: I have personally reviewed following labs and imaging studies  CBC: Recent Labs  Lab 01/29/18 1438 01/29/18 1455 01/30/18 0247  WBC 12.2*  --  10.4  NEUTROABS 9.6*  --   --   HGB 17.9* 17.7* 16.2  HCT 54.2* 52.0 48.0  MCV 98.7  --  99.0  PLT 179  --  245   Basic Metabolic Panel: Recent  Labs  Lab 01/29/18 1438 01/29/18 1455 01/30/18 0247  NA 131* 130* 134*  K 4.4 4.8 3.9  CL 93* 91* 95*  CO2 26  --  30  GLUCOSE 142* 139* 201*  BUN 9 16 12   CREATININE 0.76 0.80 0.97  CALCIUM 8.6*  --  8.4*   GFR: Estimated Creatinine Clearance: 91.6 mL/min (by C-G formula based on SCr of 0.97 mg/dL). Liver Function Tests: Recent Labs  Lab 01/29/18 1438  AST 26  ALT 21  ALKPHOS 64  BILITOT 0.6  PROT 7.8  ALBUMIN 3.8   No results for input(s): LIPASE, AMYLASE in the last 168 hours. No results for input(s): AMMONIA in the last  168 hours. Coagulation Profile: No results for input(s): INR, PROTIME in the last 168 hours. Cardiac Enzymes: No results for input(s): CKTOTAL, CKMB, CKMBINDEX, TROPONINI in the last 168 hours. BNP (last 3 results) No results for input(s): PROBNP in the last 8760 hours. HbA1C: No results for input(s): HGBA1C in the last 72 hours. CBG: No results for input(s): GLUCAP in the last 168 hours. Lipid Profile: No results for input(s): CHOL, HDL, LDLCALC, TRIG, CHOLHDL, LDLDIRECT in the last 72 hours. Thyroid Function Tests: No results for input(s): TSH, T4TOTAL, FREET4, T3FREE, THYROIDAB in the last 72 hours. Anemia Panel: No results for input(s): VITAMINB12, FOLATE, FERRITIN, TIBC, IRON, RETICCTPCT in the last 72 hours. Sepsis Labs: Recent Labs  Lab 01/29/18 1455  LATICACIDVEN 1.24    Recent Results (from the past 240 hour(s))  Blood Culture (routine x 2)     Status: None (Preliminary result)   Collection Time: 01/29/18  2:39 PM  Result Value Ref Range Status   Specimen Description BLOOD RIGHT ANTECUBITAL  Final   Special Requests   Final    BOTTLES DRAWN AEROBIC AND ANAEROBIC Blood Culture adequate volume   Culture   Final    NO GROWTH 3 DAYS Performed at [facility], [address] [place], [place] [address]    Report Status PENDING  Incomplete  Blood Culture (routine x 2)     Status: None (Preliminary result)   Collection Time: 01/29/18  3:03 PM  Result Value Ref Range Status   Specimen Description BLOOD LEFT ANTECUBITAL  Final   Special Requests   Final    BOTTLES DRAWN AEROBIC AND ANAEROBIC Blood Culture adequate volume   Culture   Final    NO GROWTH 3 DAYS Performed at [facility], [place] [address] [place], [place] [address]    Report Status PENDING  Incomplete       Radiology Studies: No results found.    Scheduled Meds: . aspirin EC  81 mg Oral Daily  . azithromycin  500 mg Oral Daily  . benazepril  20 mg Oral Daily  . bisoprolol  10 mg Oral  Daily  . buPROPion  300 mg Oral Daily  . fluticasone furoate-vilanterol  1 puff Inhalation Daily  . hydrochlorothiazide  25 mg Oral Daily  . ipratropium-albuterol  3 mL Nebulization TID  . loratadine  10 mg Oral Daily  . magnesium oxide  400 mg Oral Daily  . [START ON 02/02/2018] predniSONE  50 mg Oral Q breakfast  . Vitamin D (Ergocalciferol)  100,000 Units Oral Q7 days   Continuous Infusions:   LOS: 3 days    Time spent: 35 minutes   Dessa Phi, DO [facility] [email or web address] 02/01/2018, 10:08 AM

## 2018-02-02 DIAGNOSIS — J441 Chronic obstructive pulmonary disease with (acute) exacerbation: Principal | ICD-10-CM

## 2018-02-02 MED ORDER — AZITHROMYCIN 250 MG PO TABS: 250.0000 mg | ORAL_TABLET | Freq: Every day | ORAL | 0 refills | Status: AC

## 2018-02-02 MED ORDER — PREDNISONE 50 MG PO TABS: 50.0000 mg | ORAL_TABLET | Freq: Every day | ORAL | 0 refills | Status: AC

## 2018-02-02 MED ORDER — ALBUTEROL SULFATE HFA 108 (90 BASE) MCG/ACT IN AERS: 2.0000 | INHALATION_SPRAY | Freq: Four times a day (QID) | RESPIRATORY_TRACT | 5 refills | Status: DC | PRN

## 2018-02-02 MED ORDER — ALBUTEROL SULFATE (2.5 MG/3ML) 0.083% IN NEBU: 2.5000 mg | INHALATION_SOLUTION | Freq: Four times a day (QID) | RESPIRATORY_TRACT | 12 refills | Status: DC | PRN

## 2018-02-02 MED ORDER — COMPRESSOR/NEBULIZER MISC: 1.0000 | 0 refills | Status: DC | PRN

## 2018-02-02 MED FILL — AZITHROMYCIN 250 MG TABLET: 250 | 2 days supply | Qty: 2 | Fill #0

## 2018-02-02 MED FILL — VENTOLIN HFA 90 MCG INHALER: 108 (90 BAS | 25 days supply | Qty: 18 | Fill #0 | Status: TO

## 2018-02-02 MED FILL — predniSONE 20 MG TABS: 20 | 3 days supply | Qty: 8 | Fill #0 | Status: TO

## 2018-02-02 MED FILL — ALBUTEROL 0.083% INHAL SOLN: (2.5 MG/3ML | 7 days supply | Qty: 75 | Fill #0 | Status: TO

## 2018-02-02 NOTE — Care Management Note (Signed)
Case Management Note  Patient Details  Name: Robert [name] MRN: [id number] Date of Birth: 1955-09-21  Subjective/Objective:     From home, copd ex weaning oxygen, he has no insurance, but sees Dr. Melford Aase, but would like to start going to clinic .  NCM will schedule  A hospital follow up for patient at the clinic. He may need Match letter at discharge.   NCM gave patient the brochure for the [facility] and can go to [facility] clinic for future med ast,  When patient is discharged the [facility] will fill his scripts , NCM gave Olivia Mackie with [facility] letter and they will bring his meds to him prior to discharge.  He will need neb machine, [name] notified , will bring to patient room. He did not qualify for home oxygen.                            Action/Plan: DC home  Expected Discharge Date:  02/02/18               Expected Discharge Plan:  Home/Self Care  In-House Referral:     Discharge planning Services  CM Consult, [facility], Follow-up appt scheduled, Medication Assistance  Post Acute Care Choice:    Choice offered to:     DME Arranged:  Nebulizer machine DME Agency:  [place]:    [facility] Agency:     Status of Service:  Completed, signed off  If discussed at [place] of Stay Meetings, dates discussed:    Additional Comments:  Zenon Mayo, RN 02/02/2018, 10:50 AM

## 2018-02-02 NOTE — Discharge Summary (Signed)
Physician Discharge Summary  Robert [name] YBO:[id number] DOB: 03/24/1955 DOA: 01/29/2018  PCP: Unk [name], MD  Admit date: 01/29/2018 Discharge date: 02/02/2018  Admitted From: Home  Disposition:  Home   Recommendations for Outpatient Follow-up:  1. Follow up with PCP in 1-2 weeks 2. Please obtain BMP/CBC in one week     Home Health: None  Equipment/Devices: Nebulizer  Discharge Condition: Good  CODE STATUS: FULL Diet recommendation: Regular  Brief/Interim Summary: Robert [name] is a 63 year old male with history of COPD, hypertension, hyperlipidemia, prostate cancer, tobacco abuse who presented with shortness of breath and flulike symptoms.  He was found to be hypoxic, admitted for COPD exacerbation.  He was started on IV Solu-Medrol.     PRINCIPAL HOSPITAL DIAGNOSIS: COPD exacerbation causing acute respiratory failure with hypoxia    Discharge Diagnoses:  Acute hypoxemic, hypercarbic respiratory failure Acute COPD exacerbation Started on Solu-medrol, scheduled bronchodilators, azithromycin and supplemental O2.  O2 weaned until the day of discharge, was able to ambulate in the hall with moderate dyspnea only, no O2 requirement.  Disharged to complete steroid burst, Z-pak.  Given nebulizer and albuterol refills.  Recommended close PCP follow up.     Hypertension  Tobacco abuse Smoking cessation strongly recommended.  Modalities discussed.  On Wellbutrin for smoking.            Discharge Instructions  Discharge Instructions    Diet general   Complete by:  As directed    Discharge instructions   Complete by:  As directed    From Dr. Loleta Books: You were admitted with a COPD exacerbation. You were treated with steroids and antibiotics and albuterol.  Continue the steroids with prednisone 50 mg for 3 more days (starting Friday morning) Continue the antibiotics with azithromycin 250 mg daily for 2 more days (starting Thursday afternoon, in other  words, take on Thurs afternoon and Friday afternoon)  I have refilled your albuterol. Use albuterol every 4-6 hours on schedule for the next 2-3 days, then slowly taper off to "as needed"  Resume all your other home meds, including Breo Best of luck in quitting smoking, this is EXTREMELY important Schedule a follow up appointment with a primary care doctor as soon as possible (Ideally within about 7-10 days)   Increase activity slowly   Complete by:  As directed      Allergies as of 02/02/2018      Reactions   Codeine Other (See Comments)   REACTION: Itching   Isovue [iopamidol] Hives   Pt. Developed 1 hive after injection; Dr. Kris Hartmann spoke with patient;recommends 13 hr prep in the future      Medication List    TAKE these medications   albuterol 108 (90 Base) MCG/ACT inhaler Commonly known as:  PROAIR HFA Inhale 2 puffs into the lungs every 6 (six) hours as needed for wheezing or shortness of breath. What changed:  Another medication with the same name was added. Make sure you understand how and when to take each.   albuterol (2.5 MG/3ML) 0.083% nebulizer solution Commonly known as:  PROVENTIL Take 3 mLs (2.5 mg total) by nebulization every 6 (six) hours as needed for wheezing or shortness of breath. What changed:  You were already taking a medication with the same name, and this prescription was added. Make sure you understand how and when to take each.   aspirin EC 81 MG tablet Take 81 mg by mouth daily.   azithromycin 250 MG tablet Commonly known as:  ZITHROMAX Take  1 tablet (250 mg total) by mouth daily for 2 days.   benazepril-hydrochlorthiazide 20-25 MG tablet Commonly known as:  LOTENSIN HCT TAKE ONE TABLET BY MOUTH DAILY FOR BLOOD PRESSURE What changed:  See the new instructions.   bisoprolol-hydrochlorothiazide 10-6.25 MG tablet Commonly known as:  ZIAC TAKE ONE TABLET BY MOUTH DAILY   buPROPion 300 MG 24 hr tablet Commonly known as:  WELLBUTRIN XL TAKE ONE  TABLET BY MOUTH DAILY   Compressor/Nebulizer Misc 1 each by Does not apply route as needed.   fluticasone furoate-vilanterol 100-25 MCG/INH Aepb Commonly known as:  BREO ELLIPTA INHALE ONE DOSE INTO THE LUNGS  DAILY What changed:    how much to take  how to take this  when to take this  additional instructions   Magnesium 500 MG Tabs Take 500 mg by mouth daily.   predniSONE 50 MG tablet Commonly known as:  DELTASONE Take 1 tablet (50 mg total) by mouth daily with breakfast for 3 days.   Vitamin D (Ergocalciferol) 1.25 MG (50000 UT) Caps capsule Commonly known as:  DRISDOL Take 100,000 Units by mouth every 7 (seven) days.      Follow-up Information    [facility]   Why:  please call [phone] to schedule apt for a week from today.  Appointment made for 02/20/2018 at 3:30 PM.  Contact information: [address]  [place] [address]       [facility] - Dme Follow up.   Why:  neb machine Contact information: [place] [address] [phone]          Allergies  Allergen Reactions  . Codeine Other (See Comments)    REACTION: Itching  . Isovue [Iopamidol] Hives    Pt. Developed 1 hive after injection; Dr. Kris Hartmann spoke with patient;recommends 13 hr prep in the future    Consultations:  none   Procedures/Studies: Dg Chest Port 1 View  Result Date: 01/29/2018 CLINICAL DATA:  Shortness of breath and chest pain EXAM: PORTABLE CHEST 1 VIEW COMPARISON:  August 09, 2016 FINDINGS: There is no appreciable edema or consolidation. The heart size and pulmonary vascularity are normal. No evident adenopathy. No bone lesions. No pneumothorax. IMPRESSION: No edema or consolidation. Electronically Signed   By: Lowella Grip III M.D.   On: 01/29/2018 14:28       Subjective: Feeling better.  No chest pain, respiratory distress.  No confusion, fever.   Discharge Exam: Vitals:    02/02/18 0718 02/02/18 0748  BP: (!) 143/89   Pulse: (!) 53   Resp:    Temp: 98 F (36.7 C)   SpO2: 98% 92%   Vitals:   02/01/18 1707 02/01/18 2221 02/02/18 0718 02/02/18 0748  BP: 132/70 125/68 (!) 143/89   Pulse: 60 61 (!) 53   Resp:  14    Temp: 97.9 F (36.6 C) 97.8 F (36.6 C) 98 F (36.7 C)   TempSrc:  Oral    SpO2: 96% 94% 98% 92%  Weight:      Height:        General: Pt is alert, awake, not in acute distress, sitting in chair by window Cardiovascular: RRR, nl S1-S2, no murmurs appreciated.   No LE edema.   Respiratory: Normal respiratory rate and rhythm.  CTAB without rales or wheezes. Abdominal: Abdomen soft and non-tender.  No distension or HSM.   Neuro/Psych: Strength symmetric in upper and lower extremities.  Judgment and  insight appear normal.   The results of significant diagnostics from this hospitalization (including imaging, microbiology, ancillary and laboratory) are listed below for reference.     Microbiology: Recent Results (from the past 240 hour(s))  Blood Culture (routine x 2)     Status: None (Preliminary result)   Collection Time: 01/29/18  2:39 PM  Result Value Ref Range Status   Specimen Description BLOOD RIGHT ANTECUBITAL  Final   Special Requests   Final    BOTTLES DRAWN AEROBIC AND ANAEROBIC Blood Culture adequate volume   Culture   Final    NO GROWTH 4 DAYS Performed at [facility], [address], [place], [place] [address]    Report Status PENDING  Incomplete  Blood Culture (routine x 2)     Status: None (Preliminary result)   Collection Time: 01/29/18  3:03 PM  Result Value Ref Range Status   Specimen Description BLOOD LEFT ANTECUBITAL  Final   Special Requests   Final    BOTTLES DRAWN AEROBIC AND ANAEROBIC Blood Culture adequate volume   Culture   Final    NO GROWTH 4 DAYS Performed at [facility], [place] [address] [place], [place] [address]    Report Status PENDING  Incomplete     Labs: BNP (last 3  results) Recent Labs    01/29/18 1414  BNP 024.0*   Basic Metabolic Panel: Recent Labs  Lab 01/29/18 1438 01/29/18 1455 01/30/18 0247  NA 131* 130* 134*  K 4.4 4.8 3.9  CL 93* 91* 95*  CO2 26  --  30  GLUCOSE 142* 139* 201*  BUN 9 16 12   CREATININE 0.76 0.80 0.97  CALCIUM 8.6*  --  8.4*   Liver Function Tests: Recent Labs  Lab 01/29/18 1438  AST 26  ALT 21  ALKPHOS 64  BILITOT 0.6  PROT 7.8  ALBUMIN 3.8   No results for input(s): LIPASE, AMYLASE in the last 168 hours. No results for input(s): AMMONIA in the last 168 hours. CBC: Recent Labs  Lab 01/29/18 1438 01/29/18 1455 01/30/18 0247  WBC 12.2*  --  10.4  NEUTROABS 9.6*  --   --   HGB 17.9* 17.7* 16.2  HCT 54.2* 52.0 48.0  MCV 98.7  --  99.0  PLT 179  --  158   Cardiac Enzymes: No results for input(s): CKTOTAL, CKMB, CKMBINDEX, TROPONINI in the last 168 hours. BNP: Invalid input(s): POCBNP CBG: No results for input(s): GLUCAP in the last 168 hours. D-Dimer No results for input(s): DDIMER in the last 72 hours. Hgb A1c No results for input(s): HGBA1C in the last 72 hours. Lipid Profile No results for input(s): CHOL, HDL, LDLCALC, TRIG, CHOLHDL, LDLDIRECT in the last 72 hours. Thyroid function studies No results for input(s): TSH, T4TOTAL, T3FREE, THYROIDAB in the last 72 hours.  Invalid input(s): FREET3 Anemia work up No results for input(s): VITAMINB12, FOLATE, FERRITIN, TIBC, IRON, RETICCTPCT in the last 72 hours. Urinalysis    Component Value Date/Time   COLORURINE YELLOW 01/30/2018 0630   APPEARANCEUR CLEAR 01/30/2018 0630   LABSPEC 1.023 01/30/2018 0630   PHURINE 6.0 01/30/2018 0630   GLUCOSEU >=500 (A) 01/30/2018 0630   HGBUR MODERATE (A) 01/30/2018 0630   BILIRUBINUR NEGATIVE 01/30/2018 0630   KETONESUR 5 (A) 01/30/2018 0630   PROTEINUR 100 (A) 01/30/2018 0630   NITRITE NEGATIVE 01/30/2018 0630   LEUKOCYTESUR NEGATIVE 01/30/2018 0630   Sepsis Labs Invalid input(s): PROCALCITONIN,   WBC,  LACTICIDVEN Microbiology Recent Results (  from the past 240 hour(s))  Blood Culture (routine x 2)     Status: None (Preliminary result)   Collection Time: 01/29/18  2:39 PM  Result Value Ref Range Status   Specimen Description BLOOD RIGHT ANTECUBITAL  Final   Special Requests   Final    BOTTLES DRAWN AEROBIC AND ANAEROBIC Blood Culture adequate volume   Culture   Final    NO GROWTH 4 DAYS Performed at [facility], [address], [place], [place] [address]    Report Status PENDING  Incomplete  Blood Culture (routine x 2)     Status: None (Preliminary result)   Collection Time: 01/29/18  3:03 PM  Result Value Ref Range Status   Specimen Description BLOOD LEFT ANTECUBITAL  Final   Special Requests   Final    BOTTLES DRAWN AEROBIC AND ANAEROBIC Blood Culture adequate volume   Culture   Final    NO GROWTH 4 DAYS Performed at [facility], [place] [address] [place], [place] [address]    Report Status PENDING  Incomplete     Time coordinating discharge: 25 minutes       SIGNED:   Edwin Dada, MD  [facility] 02/02/2018, 9:07 PM

## 2018-02-03 LAB — CULTURE, BLOOD (ROUTINE X 2)
CULTURE: NO GROWTH
Culture: NO GROWTH
SPECIAL REQUESTS: ADEQUATE
Special Requests: ADEQUATE

## 2018-02-08 ENCOUNTER — Telehealth: Payer: Self-pay | Admitting: *Deleted

## 2018-02-08 NOTE — Progress Notes (Signed)
Transitions of Care Follow Up Call Note  Robert [name] is an 63 y.o. male who presented to [facility] on 01/29/2018.  The patient had the following prescriptions filled at [facility]: Azithromycin, Prednisone, Ventolin, Albuterol Inhalation  Patient was called by pharmacist and HIPAA identifiers were verified. The following questions were asked about the prescriptions filled at [facility]:  Has the patient been experiencing any side effects to the medications prescribed? no Understanding of regimen: good Understanding of indications: good Potential of compliance: good  Pharmacist comments: Patient had no questions or concerns about medications.   [x]  Patient's prescriptions filled at the [facility] were transferred to the following pharmacy:  [facility] [address] []  Patient unable to be reached after calling three times and prescriptions filled at the [facility] were transferred to preferred pharmacy found within their chart.   Robert [name] 02/08/2018, 5:01 PM Transitions of Care Pharmacy Hours: Monday - Friday 8:30am to 5:00 PM  Phone - [phone]

## 2018-02-08 NOTE — Telephone Encounter (Signed)
Patient has follow up office visit. Knows to call with any questions or concerns.   

## 2018-02-08 NOTE — Telephone Encounter (Signed)
Called patient on 02/08/2018 , 10:14 AM in an attempt to reach the patient for a hospital follow up.   Admit date: 01/29/18 Discharge: 02/02/18   He DOES NOT have any questions or concerns about medications from the hospital admission. The patient's medications were reviewed over the phone, they were counseled to bring in all current medications to the hospital follow up visit.   I advised the patient to call if any questions or concerns arise about the hospital admission or medications    Home health WAS NOT  started in the hospital.  All questions were answered and a follow up appointment was made.   Prior to Admission medications   Medication Sig Start Date End Date Taking? Authorizing Provider  albuterol (PROAIR HFA) 108 (90 Base) MCG/ACT inhaler Inhale 2 puffs into the lungs every 6 (six) hours as needed for wheezing or shortness of breath. 02/02/18   Danford, Suann Larry, MD  albuterol (PROVENTIL) (2.5 MG/3ML) 0.083% nebulizer solution Take 3 mLs (2.5 mg total) by nebulization every 6 (six) hours as needed for wheezing or shortness of breath. 02/02/18   Danford, Suann Larry, MD  aspirin EC 81 MG tablet Take 81 mg by mouth daily.    [provider]  benazepril-hydrochlorthiazide (LOTENSIN HCT) 20-25 MG tablet TAKE ONE TABLET BY MOUTH DAILY FOR BLOOD PRESSURE Patient taking differently: Take 1 tablet by mouth daily.  01/26/18   Unk [name], MD  bisoprolol-hydrochlorothiazide (ZIAC) 10-6.25 MG tablet TAKE ONE TABLET BY MOUTH DAILY Patient taking differently: Take 1 tablet by mouth daily.  12/24/17   Unk [name], MD  buPROPion (WELLBUTRIN XL) 300 MG 24 hr tablet TAKE ONE TABLET BY MOUTH DAILY Patient taking differently: Take 300 mg by mouth daily.  01/26/18   Unk [name], MD  fluticasone furoate-vilanterol (BREO ELLIPTA) 100-25 MCG/INH AEPB INHALE ONE DOSE INTO THE LUNGS  DAILY Patient taking differently: Inhale 1 puff into the lungs daily.  12/27/17   Mannam, Hart Robinsons,  MD  Magnesium 500 MG TABS Take 500 mg by mouth daily.    [provider]  Nebulizers (COMPRESSOR/NEBULIZER) MISC 1 each by Does not apply route as needed. 02/02/18   Danford, Suann Larry, MD  Vitamin D, Ergocalciferol, (DRISDOL) 50000 units CAPS capsule Take 100,000 Units by mouth every 7 (seven) days.     [provider]

## 2018-02-10 ENCOUNTER — Ambulatory Visit: Payer: Self-pay | Admitting: Internal Medicine

## 2018-02-10 ENCOUNTER — Encounter: Payer: Self-pay | Admitting: Internal Medicine

## 2018-02-10 VITALS — BP 130/84 | HR 55 | Temp 98.7°F | Ht 66.5 in | Wt 230.6 lb

## 2018-02-10 DIAGNOSIS — I10 Essential (primary) hypertension: Secondary | ICD-10-CM

## 2018-02-10 DIAGNOSIS — E782 Mixed hyperlipidemia: Secondary | ICD-10-CM

## 2018-02-10 DIAGNOSIS — J209 Acute bronchitis, unspecified: Secondary | ICD-10-CM

## 2018-02-10 DIAGNOSIS — R7303 Prediabetes: Secondary | ICD-10-CM

## 2018-02-10 DIAGNOSIS — E559 Vitamin D deficiency, unspecified: Secondary | ICD-10-CM

## 2018-02-10 NOTE — Progress Notes (Signed)
Subjective:    Patient ID: Robert [name], male    DOB: 02-Nov-1955, 63 y.o.   MRN: [id number]  HPI    Patient is a very nice 63 yo WWM with HTN, HLD, hx/o Ca Prostate, PreDiabetes. and COPD who was hospitalized between  01/19-23/2020 with [facility] presenting with hypoxia & was tx'd with iv Azithromycin, bronchodilators and O2 which was weaned as his condition gradually improved. Patient reports that he feels recovered now.  Medication Sig  . albuterol (PROAIR HFA) 108 (90 Base) MCG/ACT inhaler Inhale 2 puffs into the lungs every 6 (six) hours as needed for wheezing or shortness of breath.  Marland Kitchen albuterol (PROVENTIL) (2.5 MG/3ML) 0.083% nebulizer solution Take 3 mLs (2.5 mg total) by nebulization every 6 (six) hours as needed for wheezing or shortness of breath.  Marland Kitchen aspirin EC 81 MG tablet Take 81 mg by mouth daily.  . benazepril-hydrochlorthiazide (LOTENSIN HCT) 20-25 MG tablet TAKE ONE TABLET BY MOUTH DAILY FOR BLOOD PRESSURE (Patient taking differently: Take 1 tablet by mouth daily. )  . bisoprolol-hydrochlorothiazide (ZIAC) 10-6.25 MG tablet TAKE ONE TABLET BY MOUTH DAILY (Patient taking differently: Take 1 tablet by mouth daily. )  . buPROPion (WELLBUTRIN XL) 300 MG 24 hr tablet TAKE ONE TABLET BY MOUTH DAILY (Patient taking differently: Take 300 mg by mouth daily. )  . fluticasone furoate-vilanterol (BREO ELLIPTA) 100-25 MCG/INH AEPB INHALE ONE DOSE INTO THE LUNGS  DAILY (Patient taking differently: Inhale 1 puff into the lungs daily. )  . Magnesium 500 MG TABS Take 500 mg by mouth daily.  . Nebulizers (COMPRESSOR/NEBULIZER) MISC 1 each by Does not apply route as needed.  . Vitamin D, Ergocalciferol, (DRISDOL) 50000 units CAPS capsule Take 100,000 Units by mouth every 7 (seven) days.    Allergies  Allergen Reactions  . Codeine Other (See Comments)    REACTION: Itching  . Isovue [Iopamidol] Hives    Pt. Developed 1 hive after injection; Dr. Kris Hartmann spoke with patient;recommends 13 hr prep in the  future   Past Medical History:  Diagnosis Date  . 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 Elevated hemoglobin A1c   . Elevated PSA 03/16/2016  . Hyperlipidemia   . Hypertension   . Personal history of colonic adenomas 12/28/2007  . Pre-diabetes   . Prostate cancer ([place]) 03/16/2016  . Vitamin D deficiency    Past Surgical History:  Procedure Laterality Date  . COLONOSCOPY    . LYMPHADENECTOMY N/A 06/28/2016   Procedure: LYMPHADENECTOMY;  Surgeon: Raynelle Bring, MD;  Location: [facility];  Service: Urology;  Laterality: N/A;  . PROSTATE BIOPSY  03/16/2016   Surgical Pathology Gross and Microscopic Examination for Prostate Needle  . ROBOT ASSISTED LAPAROSCOPIC RADICAL PROSTATECTOMY N/A 06/28/2016   Procedure: XI ROBOTIC ASSISTED LAPAROSCOPIC RADICAL PROSTATECTOMY LEVEL 2 EXCISION OF PENILE LESION;  Surgeon: Raynelle Bring, MD;  Location: [facility];  Service: Urology;  Laterality: N/A;   Review of Systems    10 point systems review negative except as above.    Objective:   Physical Exam   BP 130/84   Pulse (!) 55   Temp 98.7 F (37.1 C)   Ht 5' 6.5" (1.689 m)   Wt 230 lb 9.6 oz (104.6 kg)   SpO2 95%   BMI 36.66 kg/m   HEENT - WNL. Neck - supple.  Chest - Clear equal BS. Cor - Nl HS. RRR w/o sig MGR. PP 1(+). No edema. MS- FROM w/o deformities.  Gait Nl. Neuro -  Nl w/o focal abnormalities.  Assessment & Plan:   1. Essential hypertension  2. Bronchitis, acute, with bronchospasm  3. Hyperlipidemia, mixed  4. Prediabetes  5. Vitamin D deficiency  - Labs deferred. Patient has f/u app't at [facility] / [place]  (No Charge OV today)

## 2018-02-12 NOTE — Patient Instructions (Signed)

## 2018-02-20 ENCOUNTER — Ambulatory Visit (INDEPENDENT_AMBULATORY_CARE_PROVIDER_SITE_OTHER): Payer: Self-pay | Admitting: Family Medicine

## 2018-02-20 ENCOUNTER — Encounter: Payer: Self-pay | Admitting: Family Medicine

## 2018-02-20 VITALS — BP 97/63 | HR 61 | Resp 17 | Ht 66.5 in | Wt 233.0 lb

## 2018-02-20 DIAGNOSIS — E669 Obesity, unspecified: Secondary | ICD-10-CM

## 2018-02-20 DIAGNOSIS — R7303 Prediabetes: Secondary | ICD-10-CM

## 2018-02-20 DIAGNOSIS — R059 Cough, unspecified: Secondary | ICD-10-CM

## 2018-02-20 DIAGNOSIS — R05 Cough: Secondary | ICD-10-CM

## 2018-02-20 DIAGNOSIS — I10 Essential (primary) hypertension: Secondary | ICD-10-CM

## 2018-02-20 DIAGNOSIS — J449 Chronic obstructive pulmonary disease, unspecified: Secondary | ICD-10-CM

## 2018-02-20 DIAGNOSIS — E559 Vitamin D deficiency, unspecified: Secondary | ICD-10-CM

## 2018-02-20 DIAGNOSIS — Z1329 Encounter for screening for other suspected endocrine disorder: Secondary | ICD-10-CM

## 2018-02-20 DIAGNOSIS — Z6837 Body mass index (BMI) 37.0-37.9, adult: Secondary | ICD-10-CM

## 2018-02-20 DIAGNOSIS — Z7689 Persons encountering health services in other specified circumstances: Secondary | ICD-10-CM

## 2018-02-20 DIAGNOSIS — F17210 Nicotine dependence, cigarettes, uncomplicated: Secondary | ICD-10-CM

## 2018-02-20 DIAGNOSIS — F17200 Nicotine dependence, unspecified, uncomplicated: Secondary | ICD-10-CM

## 2018-02-20 NOTE — Patient Instructions (Addendum)
Thank you for choosing Primary Care at [facility] to be your medical home!    Robert [name] was seen by Molli Barrows, FNP today.   Robert [name]'s primary care provider is Scot Jun, FNP.   For the best care possible, you should try to see Molli Barrows, FNP-C whenever you come to the clinic.   We look forward to seeing you again soon!  If you have any questions about your visit today, please call us at [phone] or feel free to reach your primary care provider via Rives.    Please stop taking the bisoprolol-hydrochlorothiazide today.   Please return to the office in 2 weeks for fasting labs and blood pressure check  [facility] and [place],  [id number]  [phone]  Preventing Type 2 Diabetes Mellitus Type 2 diabetes (type 2 diabetes mellitus) is a long-term (chronic) disease that affects blood sugar (glucose) levels. Normally, a hormone called insulin allows glucose to enter cells in the body. The cells use glucose for energy. In type 2 diabetes, one or both of these problems may be present:  The body does not make enough insulin.  The body does not respond properly to insulin that it makes (insulin resistance). Insulin resistance or lack of insulin causes excess glucose to build up in the blood instead of going into cells. As a result, high blood glucose (hyperglycemia) develops, which can cause many complications. Being overweight or obese and having an inactive (sedentary) lifestyle can increase your risk for diabetes. Type 2 diabetes can be delayed or prevented by making certain nutrition and lifestyle changes. What nutrition changes can be made?   Eat healthy meals and snacks regularly. Keep a healthy snack with you for when you get hungry between meals, such as fruit or a handful of nuts.  Eat lean meats and proteins that are low in saturated fats, such as chicken, fish, egg whites, and beans. Avoid processed  meats.  Eat plenty of fruits and vegetables and plenty of grains that have not been processed (whole grains). It is recommended that you eat: ? 1?2 cups of fruit every day. ? 2?3 cups of vegetables every day. ? 6?8 oz of whole grains every day, such as oats, whole wheat, bulgur, brown rice, quinoa, and millet.  Eat low-fat dairy products, such as milk, yogurt, and cheese.  Eat foods that contain healthy fats, such as nuts, avocado, olive oil, and canola oil.  Drink water throughout the day. Avoid drinks that contain added sugar, such as soda or sweet tea.  Follow instructions from your health care provider about specific eating or drinking restrictions.  Control how much food you eat at a time (portion size). ? Check food labels to find out the serving sizes of foods. ? Use a kitchen scale to weigh amounts of foods.  Saute or steam food instead of frying it. Cook with water or broth instead of oils or butter.  Limit your intake of: ? Salt (sodium). Have no more than 1 tsp (2,400 mg) of sodium a day. If you have heart disease or high blood pressure, have less than ? tsp (1,500 mg) of sodium a day. ? Saturated fat. This is fat that is solid at room temperature, such as butter or fat on meat. What lifestyle changes can be made? Activity   Do moderate-intensity physical activity for at least 30 minutes on at least 5 days of the week, or as much as told  by your health care provider.  Ask your health care provider what activities are safe for you. A mix of physical activities may be best, such as walking, swimming, cycling, and strength training.  Try to add physical activity into your day. For example: ? Park in spots that are farther away than usual, so that you walk more. For example, park in a far corner of the parking lot when you go to the office or the grocery store. ? Take a walk during your lunch break. ? Use stairs instead of elevators or escalators. Weight Loss  Lose  weight as directed. Your health care provider can determine how much weight loss is best for you and can help you lose weight safely.  If you are overweight or obese, you may be instructed to lose at least 5?7 % of your body weight. Alcohol and Tobacco   Limit alcohol intake to no more than 1 drink a day for nonpregnant women and 2 drinks a day for men. One drink equals 12 oz of beer, 5 oz of wine, or 1 oz of hard liquor.  Do not use any tobacco products, such as cigarettes, chewing tobacco, and e-cigarettes. If you need help quitting, ask your health care provider. Work With [place] Provider  Have your blood glucose tested regularly, as told by your health care provider.  Discuss your risk factors and how you can reduce your risk for diabetes.  Get screening tests as told by your health care provider. You may have screening tests regularly, especially if you have certain risk factors for type 2 diabetes.  Make an appointment with a diet and nutrition specialist (registered dietitian). A registered dietitian can help you make a healthy eating plan and can help you understand portion sizes and food labels. Why are these changes important?  It is possible to prevent or delay type 2 diabetes and related health problems by making lifestyle and nutrition changes.  It can be difficult to recognize signs of type 2 diabetes. The best way to avoid possible damage to your body is to take actions to prevent the disease before you develop symptoms. What can happen if changes are not made?  Your blood glucose levels may keep increasing. Having high blood glucose for a long time is dangerous. Too much glucose in your blood can damage your blood vessels, heart, kidneys, nerves, and eyes.  You may develop prediabetes or type 2 diabetes. Type 2 diabetes can lead to many chronic health problems and complications, such as: ? Heart disease. ? Stroke. ? Blindness. ? Kidney  disease. ? Depression. ? Poor circulation in the feet and legs, which could lead to surgical removal (amputation) in severe cases. Where to find support  Ask your health care provider to recommend a registered dietitian, diabetes educator, or weight loss program.  Look for local or online weight loss groups.  Join a gym, fitness club, or outdoor activity group, such as a walking club. Where to find more information To learn more about diabetes and diabetes prevention, visit:  American Diabetes Association (ADA): www.diabetes.[employer] of Diabetes and Digestive and Kidney Diseases: FindSpin.nl To learn more about healthy eating, visit:  The U.S. Department of Agriculture Scientist, research (physical sciences)), Choose My Plate: http://wiley-williams.com/  Office of Disease Prevention and Health Promotion (ODPHP), Dietary Guidelines: SurferLive.at Summary  You can reduce your risk for type 2 diabetes by increasing your physical activity, eating healthy foods, and losing weight as directed.  Talk with your health care  provider about your risk for type 2 diabetes. Ask about any blood tests or screening tests that you need to have. This information is not intended to replace advice given to you by your health care provider. Make sure you discuss any questions you have with your health care provider. Document Released: 04/21/2015 Document Revised: 12/09/2016 Document Reviewed: 02/18/2015 Elsevier Interactive Patient Education  2019 [employer]. smoiking  Steps to Quit Smoking  Smoking tobacco can be bad for your health. It can also affect almost every organ in your body. Smoking puts you and people around you at risk for many serious long-lasting (chronic) diseases. Quitting smoking is hard, but it is one of the best things that you can do for your health. It is never too late to quit. What are the benefits of quitting smoking? When you quit  smoking, you lower your risk for getting serious diseases and conditions. They can include:  Lung cancer or lung disease.  Heart disease.  Stroke.  Heart attack.  Not being able to have children (infertility).  Weak bones (osteoporosis) and broken bones (fractures). If you have coughing, wheezing, and shortness of breath, those symptoms may get better when you quit. You may also get sick less often. If you are pregnant, quitting smoking can help to lower your chances of having a baby of low birth weight. What can I do to help me quit smoking? Talk with your doctor about what can help you quit smoking. Some things you can do (strategies) include:  Quitting smoking totally, instead of slowly cutting back how much you smoke over a period of time.  Going to in-person counseling. You are more likely to quit if you go to many counseling sessions.  Using resources and support systems, such as: ? Database administrator with a Social worker. ? Phone quitlines. ? Careers information officer. ? Support groups or group counseling. ? Text messaging programs. ? Mobile phone apps or applications.  Taking medicines. Some of these medicines may have nicotine in them. If you are pregnant or breastfeeding, do not take any medicines to quit smoking unless your doctor says it is okay. Talk with your doctor about counseling or other things that can help you. Talk with your doctor about using more than one strategy at the same time, such as taking medicines while you are also going to in-person counseling. This can help make quitting easier. What things can I do to make it easier to quit? Quitting smoking might feel very hard at first, but there is a lot that you can do to make it easier. Take these steps:  Talk to your family and friends. Ask them to support and encourage you.  Call phone quitlines, reach out to support groups, or work with a Social worker.  Ask people who smoke to not smoke around you.  Avoid  places that make you want (trigger) to smoke, such as: ? Bars. ? Parties. ? Smoke-break areas at work.  Spend time with people who do not smoke.  Lower the stress in your life. Stress can make you want to smoke. Try these things to help your stress: ? Getting regular exercise. ? Deep-breathing exercises. ? Yoga. ? Meditating. ? Doing a body scan. To do this, close your eyes, focus on one area of your body at a time from head to toe, and notice which parts of your body are tense. Try to relax the muscles in those areas.  Download or buy apps on your mobile phone or tablet that  can help you stick to your quit plan. There are many free apps, such as QuitGuide from the [employer] Office manager for Disease Control and Prevention). You can find more support from smokefree.gov and other websites. This information is not intended to replace advice given to you by your health care provider. Make sure you discuss any questions you have with your health care provider. Document Released: 10/24/2008 Document Revised: 08/26/2015 Document Reviewed: 05/14/2014 Elsevier Interactive Patient Education  2019 [employer].

## 2018-02-20 NOTE — Progress Notes (Deleted)
New Patient Office Visit  Subjective:  Patient ID: Robert [name], male    DOB: 28-Dec-1955  Age: 63 y.o. MRN: [id number]  CC:  Chief Complaint  Patient presents with  . Establish Care    HPI Robert [name] is a 63 year old caucasian male that presents today to establish care. He endorses that he was previously seen by his primary care and endorses that he has lost insurance coverage which brings him in today. He was previously hospitalized mid January of this year for COPD exacerbation. He endorses that he feels fine and his "COPD has been fine". He denies having increased use of rescue inhaler. He denies any dizziness, blurred vision, numbness and tingling. He denies chest pain, palpitations, or lower extremity swelling. He endorses occasional cough without sputum production or increasing shortness breath.   Past Medical History:  Diagnosis Date  . 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 Elevated hemoglobin A1c   . Elevated PSA 03/16/2016  . Hyperlipidemia   . Hypertension   . Personal history of colonic adenomas 12/28/2007  . Pre-diabetes   . Prostate cancer ([place]) 03/16/2016  . Vitamin D deficiency     Past Surgical History:  Procedure Laterality Date  . COLONOSCOPY    . LYMPHADENECTOMY N/A 06/28/2016   Procedure: LYMPHADENECTOMY;  Surgeon: Raynelle Bring, MD;  Location: [facility];  Service: Urology;  Laterality: N/A;  . PROSTATE BIOPSY  03/16/2016   Surgical Pathology Gross and Microscopic Examination for Prostate Needle  . ROBOT ASSISTED LAPAROSCOPIC RADICAL PROSTATECTOMY N/A 06/28/2016   Procedure: XI ROBOTIC ASSISTED LAPAROSCOPIC RADICAL PROSTATECTOMY LEVEL 2 EXCISION OF PENILE LESION;  Surgeon: Raynelle Bring, MD;  Location: [facility];  Service: Urology;  Laterality: N/A;    Family History  Problem Relation Age of Onset  . Cancer Mother        lung  . Lung disease Neg Hx   Brother-CAD  Social History   Socioeconomic History  . Marital status: Married    Spouse name: Not  on file  . Number of children: Not on file  . Years of education: Not on file  . Highest education level: Not on file  Occupational History  . Not on file  Social Needs  . Financial resource strain: Not on file  . Food insecurity:    Worry: Not on file    Inability: Not on file  . Transportation needs:    Medical: Not on file    Non-medical: Not on file  Tobacco Use  . Smoking status: Current Every Day Smoker    Packs/day: 0.75    Years: 40.00    Pack years: 30.00    Types: Cigarettes  . Smokeless tobacco: Never Used  . Tobacco comment: Peak rate of 2ppd. Has never attempted to quit.   Substance and Sexual Activity  . Alcohol use: Yes    Alcohol/week: 4.0 standard drinks    Types: 4 Cans of beer per week  . Drug use: No  . Sexual activity: Yes  Lifestyle  . Physical activity:    Days per week: Not on file    Minutes per session: Not on file  . Stress: Not on file  Relationships  . Social connections:    Talks on phone: Not on file    Gets together: Not on file    Attends religious service: Not on file    Active member of club or organization: Not on file    Attends meetings of clubs or organizations: Not on  file    Relationship status: Not on file  . Intimate partner violence:    Fear of current or ex partner: Not on file    Emotionally abused: Not on file    Physically abused: Not on file    Forced sexual activity: Not on file  Other Topics Concern  . Not on file  Social History Narrative   [facility] (06/29/16):   Currently works as a Furniture conservator/restorer. Exposure primarily to aluminum and steel. No history of bird or mold exposure. No recent hot tub exposure. Does have 2 dogs.    ROS Review of Systems  Constitutional: Negative for activity change, appetite change, fatigue and fever.  HENT: Negative for congestion and sore throat.   Respiratory: Negative for cough and shortness of breath.   Cardiovascular: Negative for chest pain, palpitations and leg swelling.   Gastrointestinal: Negative for abdominal pain, blood in stool, diarrhea, nausea and vomiting.  Genitourinary: Negative for dysuria and hematuria.  Neurological: Negative for dizziness, weakness, light-headedness and headaches.    Objective:   Today's Vitals: BP 97/63   Pulse 61   Resp 17   Ht 5' 6.5" (1.689 m)   Wt 233 lb (105.7 kg)   SpO2 95%   BMI 37.04 kg/m   Physical Exam Constitutional:      Appearance: Normal appearance.  Cardiovascular:     Rate and Rhythm: Normal rate and regular rhythm.     Pulses: Normal pulses.     Comments: Distant heart tones on auscultation Pulmonary:     Effort: Pulmonary effort is normal. No respiratory distress.     Breath sounds: Normal breath sounds. No wheezing.     Comments: Clear and diminished lung sounds Neurological:     General: No focal deficit present.     Mental Status: He is alert and oriented to person, place, and time.  Psychiatric:        Mood and Affect: Mood normal.        Behavior: Behavior normal.        Thought Content: Thought content normal.        Judgment: Judgment normal.     Assessment & Plan:   Problem List Items Addressed This Visit    None    Visit Diagnoses    Encounter to establish care    -  Primary     1. Encounter to establish care Will have patient follow up in 3 months for chronic conditons Follow up in 2 weeks for fasting labs  2. BMI 37.0-37.9, adult Healthy lifestyle changes discussed for weight improvement  3. Smoker Patient actively trying to quit and has decreased cigarette consumption significantly  4. COPD, severe ([place]) Will refer to pulmonology, no acute issues at this time. Currently taking inhalers as prescribed  5. Prediabetes - Hemoglobin A1c - Comprehensive metabolic panel  6. Cough  7. Screening for thyroid disorder - Thyroid Panel With TSH  8. Vitamin D deficiency - Vitamin D, 25-hydroxy  9. Hypertension - Discontinuing bisoprolol-hydrochlorothiazide  10-6.25mg  and will have patient return in 2 weeks for BP check   Outpatient Encounter Medications as of 02/20/2018  Medication Sig  . albuterol (PROAIR HFA) 108 (90 Base) MCG/ACT inhaler Inhale 2 puffs into the lungs every 6 (six) hours as needed for wheezing or shortness of breath.  [name] Kitchen albuterol (PROVENTIL) (2.5 MG/3ML) 0.083% nebulizer solution Take 3 mLs (2.5 mg total) by nebulization every 6 (six) hours as needed for wheezing or shortness of breath.  [name] Kitchen aspirin  EC 81 MG tablet Take 81 mg by mouth daily.  . benazepril-hydrochlorthiazide (LOTENSIN HCT) 20-25 MG tablet TAKE ONE TABLET BY MOUTH DAILY FOR BLOOD PRESSURE (Patient taking differently: Take 1 tablet by mouth daily. )  . bisoprolol-hydrochlorothiazide (ZIAC) 10-6.25 MG tablet TAKE ONE TABLET BY MOUTH DAILY (Patient taking differently: Take 1 tablet by mouth daily. )  . buPROPion (WELLBUTRIN XL) 300 MG 24 hr tablet TAKE ONE TABLET BY MOUTH DAILY (Patient taking differently: Take 300 mg by mouth daily. )  . fluticasone furoate-vilanterol (BREO ELLIPTA) 100-25 MCG/INH AEPB INHALE ONE DOSE INTO THE LUNGS  DAILY (Patient taking differently: Inhale 1 puff into the lungs daily. )  . Magnesium 500 MG TABS Take 500 mg by mouth daily.  . Nebulizers (COMPRESSOR/NEBULIZER) MISC 1 each by Does not apply route as needed.  . [DISCONTINUED] Vitamin D, Ergocalciferol, (DRISDOL) 50000 units CAPS capsule Take 100,000 Units by mouth every 7 (seven) days.    No facility-administered encounter medications on file as of 02/20/2018.    A total of 20 minutes spent, greater than 50 % of this time was spent assessing, counseling and coordination of care.  Follow-up: Return for 2 weeks nurse visit BP check and fasting lipid.   Claria Dice, RN    Molli Barrows, FNP Primary Care at [facility] [address] [place] [phone]: [phone]

## 2018-02-21 LAB — THYROID PANEL WITH TSH
FREE THYROXINE INDEX: 2.2 (ref 1.2–4.9)
T3 Uptake Ratio: 26 % (ref 24–39)
T4, Total: 8.6 ug/dL (ref 4.5–12.0)
TSH: 1.21 u[IU]/mL (ref 0.450–4.500)

## 2018-02-21 LAB — COMPREHENSIVE METABOLIC PANEL
ALT: 19 IU/L (ref 0–44)
AST: 17 IU/L (ref 0–40)
Albumin/Globulin Ratio: 1.6 (ref 1.2–2.2)
Albumin: 4 g/dL (ref 3.8–4.8)
Alkaline Phosphatase: 63 IU/L (ref 39–117)
BUN/Creatinine Ratio: 19 (ref 10–24)
BUN: 17 mg/dL (ref 8–27)
Bilirubin Total: 0.3 mg/dL (ref 0.0–1.2)
CO2: 25 mmol/L (ref 20–29)
Calcium: 8.8 mg/dL (ref 8.6–10.2)
Chloride: 95 mmol/L — ABNORMAL LOW (ref 96–106)
Creatinine, Ser: 0.9 mg/dL (ref 0.76–1.27)
GFR calc Af Amer: 106 mL/min/{1.73_m2} (ref >59)
GFR calc non Af Amer: 92 mL/min/{1.73_m2} (ref >59)
GLUCOSE: 86 mg/dL (ref 65–99)
Globulin, Total: 2.5 g/dL (ref 1.5–4.5)
Potassium: 4.2 mmol/L (ref 3.5–5.2)
Sodium: 137 mmol/L (ref 134–144)
Total Protein: 6.5 g/dL (ref 6.0–8.5)

## 2018-02-21 LAB — HEMOGLOBIN A1C
Est. average glucose Bld gHb Est-mCnc: 123 mg/dL
Hgb A1c MFr Bld: 5.9 % — ABNORMAL HIGH (ref 4.8–5.6)

## 2018-02-21 LAB — VITAMIN D 25 HYDROXY (VIT D DEFICIENCY, FRACTURES): Vit D, 25-Hydroxy: 47.7 ng/mL (ref 30.0–100.0)

## 2018-02-22 NOTE — Progress Notes (Signed)
Robert [name], is a 63 y.o. male  YKD:[id number]  [id number]:[id number]  DOB - [birth year]  CC:  Chief Complaint  Patient presents with  . Establish Care       HPI: Robert [name] is a 63 y.o. male is here today to establish care.   Robert [name] has Essential hypertension; Hyperlipidemia, mixed; Vitamin D deficiency; Elevated hemoglobin A1c; Testosterone deficiency; Personal history of colonic adenomas; Obesity (BMI 35.55); Encounter for long-term (current) use of medications; Medication management; Malignant neoplasm of prostate ([place]); Prostate cancer ([place]); Smoker; Prediabetes; COPD, severe ([place]); FH: hypertension; and COPD exacerbation ([place]) on their problem list.   COPD History of COPD. Previously managed by primary care. Currently a every smoker. Prescribed both Breo Ellipta and albuterol. Uses Breo daily and albuterol as needed. Currently reducing number cigarettes smoked daily and is down to 5 cigarettes per day. Denies active wheezing. He has an intermittent chronic non-productive cough. Chronic has dyspnea with exertional activity. No prior pulmonology evaluation.  Last hospitalization for COPD 01/29/2018. He was discharged with azithromycin and steroids, which he reports completing. No prior oxygen dependence.    Hypertension History of hypertension. No home monitoring of blood pressure. Blood pressure is soft today. Current antihypertension medication benazepril-HCTZ and bisoprolol-HCTZ. He recently follow up with prior PCP and his blood pressure was 130/84. Denies dizziness, fatigue, chest tightness, or shortness of breath.   History of prostate cancer History of total prostatectomy due to prostate cancer without mets.  He is currently following -up with urology every 6 months. He is due for follow-up.  Unknown of recent PSA level. Denies problems with urine urgency, frequency, urine retention.   Current medications: Current Outpatient Medications:  .  albuterol (PROAIR HFA) 108 (90 Base)  MCG/ACT inhaler, Inhale 2 puffs into the lungs every 6 (six) hours as needed for wheezing or shortness of breath., Disp: 1 Inhaler, Rfl: 5 .  albuterol (PROVENTIL) (2.5 MG/3ML) 0.083% nebulizer solution, Take 3 mLs (2.5 mg total) by nebulization every 6 (six) hours as needed for wheezing or shortness of breath., Disp: 75 mL, Rfl: 12 .  aspirin EC 81 MG tablet, Take 81 mg by mouth daily., Disp: , Rfl:  .  benazepril-hydrochlorthiazide (LOTENSIN HCT) 20-25 MG tablet, TAKE ONE TABLET BY MOUTH DAILY FOR BLOOD PRESSURE (Patient taking differently: Take 1 tablet by mouth daily. ), Disp: 90 tablet, Rfl: 0 .  buPROPion (WELLBUTRIN XL) 300 MG 24 hr tablet, TAKE ONE TABLET BY MOUTH DAILY (Patient taking differently: Take 300 mg by mouth daily. ), Disp: 30 tablet, Rfl: 0 .  fluticasone furoate-vilanterol (BREO ELLIPTA) 100-25 MCG/INH AEPB, INHALE ONE DOSE INTO THE LUNGS  DAILY (Patient taking differently: Inhale 1 puff into the lungs daily. ), Disp: 60 each, Rfl: 4 .  Magnesium 500 MG TABS, Take 500 mg by mouth daily., Disp: , Rfl:  .  Nebulizers (COMPRESSOR/NEBULIZER) MISC, 1 each by Does not apply route as needed., Disp: 1 each, Rfl: 0   Pertinent family medical history: family history includes Cancer in his mother.   Allergies  Allergen Reactions  . Codeine Other (See Comments)    REACTION: Itching  . Isovue [Iopamidol] Hives    Pt. Developed 1 hive after injection; Dr. Kris Hartmann spoke with patient;recommends 13 hr prep in the future    Social History   Socioeconomic History  . Marital status: Married    Spouse name: Not on file  . Number of children: Not on file  . Years of education: Not on file  . Highest  education level: Not on file  Occupational History  . Not on file  Social Needs  . Financial resource strain: Not on file  . Food insecurity:    Worry: Not on file    Inability: Not on file  . Transportation needs:    Medical: Not on file    Non-medical: Not on file  Tobacco Use  .  Smoking status: Current Every Day Smoker    Packs/day: 0.25    Years: 40.00    Pack years: 10.00    Types: Cigarettes  . Smokeless tobacco: Never Used  . Tobacco comment: has decreased smoking significantly with goal of quitting  Substance and Sexual Activity  . Alcohol use: Yes    Alcohol/week: 4.0 standard drinks    Types: 4 Cans of beer per week  . Drug use: No  . Sexual activity: Yes  Lifestyle  . Physical activity:    Days per week: Not on file    Minutes per session: Not on file  . Stress: Not on file  Relationships  . Social connections:    Talks on phone: Not on file    Gets together: Not on file    Attends religious service: Not on file    Active member of club or organization: Not on file    Attends meetings of clubs or organizations: Not on file    Relationship status: Not on file  . Intimate partner violence:    Fear of current or ex partner: Not on file    Emotionally abused: Not on file    Physically abused: Not on file    Forced sexual activity: Not on file  Other Topics Concern  . Not on file  Social History Narrative   [facility] (06/29/16):   Currently works as a Furniture conservator/restorer. Exposure primarily to aluminum and steel. No history of bird or mold exposure. No recent hot tub exposure. Does have 2 dogs.    Review of Systems: Pertinent negatives listed in HPI Objective:   Vitals:   02/20/18 1526  BP: 97/63  Pulse: 61  Resp: 17  SpO2: 95%    BP Readings from Last 3 Encounters:  02/20/18 97/63  02/10/18 130/84  02/02/18 (!) 143/89    Filed Weights   02/20/18 1526  Weight: 233 lb (105.7 kg)      Physical Exam: Constitutional: Patient appears well-developed and well-nourished. No distress. HENT: Normocephalic, atraumatic, External right and left ear normal.  Eyes: Conjunctivae and EOM are normal. PERRLA, no scleral icterus. Neck: Normal ROM. Neck supple. No JVD. No tracheal deviation. No thyromegaly. CVS: RRR, S1/S2 +, no murmurs, no  gallops, no carotid bruit.  Pulmonary: Diminished lung sounds. Negative stridor, rhonchi, wheezes, rales.  Abdominal: Soft. BS +, no distension, tenderness, rebound or guarding.  Musculoskeletal: Normal range of motion. No edema and no tenderness.  Neuro: Alert. Normal muscle tone coordination. Normal gait. BUE and BLE strength 5/5.  Skin: Skin is warm and dry. No rash noted. Not diaphoretic. No erythema. No pallor. Psychiatric: Normal mood and affect. Behavior, judgment, thought content normal.  Lab Results (prior encounters)  Lab Results  Component Value Date   WBC 10.4 01/30/2018   HGB 16.2 01/30/2018   HCT 48.0 01/30/2018   MCV 99.0 01/30/2018   PLT 158 01/30/2018   Lab Results  Component Value Date   CREATININE 0.90 02/20/2018   BUN 17 02/20/2018   NA 137 02/20/2018   K 4.2 02/20/2018   CL 95 (L) 02/20/2018  CO2 25 02/20/2018    Lab Results  Component Value Date   HGBA1C 5.9 (H) 02/20/2018       Component Value Date/Time   CHOL 209 (H) 07/22/2016 1729   TRIG 225 (H) 07/22/2016 1729   HDL 54 07/22/2016 1729   CHOLHDL 3.9 07/22/2016 1729   VLDL 45 (H) 07/22/2016 1729   LDLCALC 110 (H) 07/22/2016 1729        Assessment and plan:  1. Encounter to establish care 2. BMI 37.0-37.9, adult Encouraged efforts to reduce weight include engaging in physical activity as tolerated with goal of 150 minutes per week. Improve dietary choices and eat a meal regimen consistent with a Mediterranean or DASH diet. Reduce simple carbohydrates. Do not skip meals and eat healthy snacks throughout the day to avoid over-eating at dinner. Set a goal weight loss that is achievable for you. 3. Smoker -encouraged cessation   4. COPD, severe ([place]) -continue current regimen Referral to pulmonology   5. Prediabetes  - Hemoglobin A1c - Comprehensive metabolic panel  6. Cough Continue inhaler therapy  -encouraged smoking cessation   7. Screening for thyroid disorder - Thyroid Panel  With TSH  8. Vitamin D deficiency - Vitamin D, 25-hydroxy  9. Essential hypertension Soft reading today Discontinue Bisoprolol-HCTZ  Return for recheck in 2 weeks. If BP elevated will consider resuming bisprolol   Return for 2 weeks nurse visit BP check and fasting lipid.   The patient was given clear instructions to go to ER or return to medical center if symptoms don't improve, worsen or new problems develop. The patient verbalized understanding. The patient was advised  to call and obtain lab results if they haven't heard anything from out office within 7-10 business days.  Molli Barrows, FNP Primary Care at [facility] [address], [place] [address] [phone]: [phone]    This note has been created with Dragon speech recognition software and Engineer, materials. Any transcriptional errors are unintentional.

## 2018-02-26 ENCOUNTER — Other Ambulatory Visit: Payer: Self-pay | Admitting: Family Medicine

## 2018-02-26 DIAGNOSIS — Z13220 Encounter for screening for lipoid disorders: Secondary | ICD-10-CM

## 2018-02-26 NOTE — Progress Notes (Signed)
Lipid panel future ordered.

## 2018-03-06 ENCOUNTER — Ambulatory Visit: Payer: Self-pay

## 2018-03-06 VITALS — BP 133/84 | HR 66

## 2018-03-06 DIAGNOSIS — I10 Essential (primary) hypertension: Secondary | ICD-10-CM

## 2018-03-06 DIAGNOSIS — Z13220 Encounter for screening for lipoid disorders: Secondary | ICD-10-CM

## 2018-03-06 DIAGNOSIS — E782 Mixed hyperlipidemia: Secondary | ICD-10-CM

## 2018-03-06 MED ORDER — FLUTICASONE FUROATE-VILANTEROL 100-25 MCG/INH IN AEPB: INHALATION_SPRAY | RESPIRATORY_TRACT | 4 refills | Status: DC

## 2018-03-06 MED ORDER — BUPROPION HCL ER (XL) 300 MG PO TB24: 300.0000 mg | ORAL_TABLET | Freq: Every day | ORAL | 4 refills | Status: DC

## 2018-03-06 MED FILL — BREO ELLIPTA 100-25 MCG INH: 100-25 | 30 days supply | Qty: 60 | Fill #0

## 2018-03-06 MED FILL — BUPROPION HCL XL 300 MG TAB: 300 | 30 days supply | Qty: 30 | Fill #0

## 2018-03-06 NOTE — Progress Notes (Signed)
Patient here for BP check. BP today was 133/84. Pulse 66. Spoke with provider & she would like to see patient back in 6 months. KWalker, CMA.

## 2018-03-07 LAB — LIPID PANEL
Chol/HDL Ratio: 3.2 ratio (ref 0.0–5.0)
Cholesterol, Total: 204 mg/dL — ABNORMAL HIGH (ref 100–199)
HDL: 64 mg/dL
LDL Calculated: 122 mg/dL — ABNORMAL HIGH (ref 0–99)
Triglycerides: 88 mg/dL (ref 0–149)
VLDL Cholesterol Cal: 18 mg/dL (ref 5–40)

## 2018-03-09 MED ORDER — ATORVASTATIN CALCIUM 20 MG PO TABS: 20.0000 mg | ORAL_TABLET | Freq: Every day | ORAL | 3 refills | Status: DC

## 2018-03-09 NOTE — Progress Notes (Signed)
Recent cholesterol panel indicates an unhealthy level of total cholesterol and LDL which is your bad cholesterol. I would recommend starting statin therapy with Atorvastatin 20 mg once daily after evening meal. Medication will be sent electronically to your pharmacy on file. I will need for you to return to the office in 8 weeks for repeat fasting cholesterol panel and liver function studies. Please call the office to schedule lab appointment for a date and time is convenient for you.  Our office number is [phone]

## 2018-03-09 NOTE — Addendum Note (Signed)
Addended by: Scot Jun on: 03/09/2018 08:05 PM   Modules accepted: Orders

## 2018-03-20 ENCOUNTER — Ambulatory Visit: Payer: Self-pay | Admitting: Internal Medicine

## 2018-04-19 MED FILL — BUPROPION HCL XL 300 MG TAB: 300 | 30 days supply | Qty: 30 | Fill #1

## 2018-04-19 MED FILL — BREO ELLIPTA 100-25 MCG INH: 100-25 | 30 days supply | Qty: 60 | Fill #1

## 2018-05-03 ENCOUNTER — Other Ambulatory Visit: Payer: Self-pay | Admitting: Internal Medicine

## 2018-05-03 DIAGNOSIS — I10 Essential (primary) hypertension: Secondary | ICD-10-CM

## 2018-05-08 ENCOUNTER — Other Ambulatory Visit: Payer: Self-pay | Admitting: Internal Medicine

## 2018-05-08 DIAGNOSIS — I10 Essential (primary) hypertension: Secondary | ICD-10-CM

## 2018-05-10 ENCOUNTER — Other Ambulatory Visit: Payer: Self-pay | Admitting: Internal Medicine

## 2018-05-10 DIAGNOSIS — I10 Essential (primary) hypertension: Secondary | ICD-10-CM

## 2018-05-11 ENCOUNTER — Telehealth: Payer: Self-pay | Admitting: *Deleted

## 2018-05-11 NOTE — Telephone Encounter (Signed)
A message was left to inform the patient to request his refills from his new provider.

## 2018-05-23 MED FILL — BUPROPION HCL XL 300 MG TAB: 300 | 30 days supply | Qty: 30 | Fill #2

## 2018-05-23 MED FILL — BREO ELLIPTA 100-25 MCG INH: 100-25 | 30 days supply | Qty: 60 | Fill #2

## 2018-06-20 MED FILL — BUPROPION HCL XL 300 MG TAB: 300 | 30 days supply | Qty: 30 | Fill #3

## 2018-06-20 MED FILL — BREO ELLIPTA 100-25 MCG INH: 100-25 | 30 days supply | Qty: 60 | Fill #3

## 2018-07-17 IMAGING — CT CT ABD-PELV W/ CM
3 of 5 series · 17 of 46 positions shown, 19 images · IV contrast (ISOVUE 300)
Comparison: None.

CLINICAL DATA: Newly diagnosed prostate cancer.

EXAM:
CT ABDOMEN AND PELVIS WITH CONTRAST
TECHNIQUE: Multidetector CT imaging of the abdomen and pelvis was performed
using the standard protocol following bolus administration of
intravenous contrast.
CONTRAST:  100mL [id number] IOPAMIDOL (0QXCDK-[id number]) INJECTION 61%
Patient developed a hive after IV contrast injection.

[Series 2: axial st · axial · 0.79mm/px · z∈[-480,-104]mm · 12 of 89 slices shown, 14 images]
[im 7/89  soft-tissue]
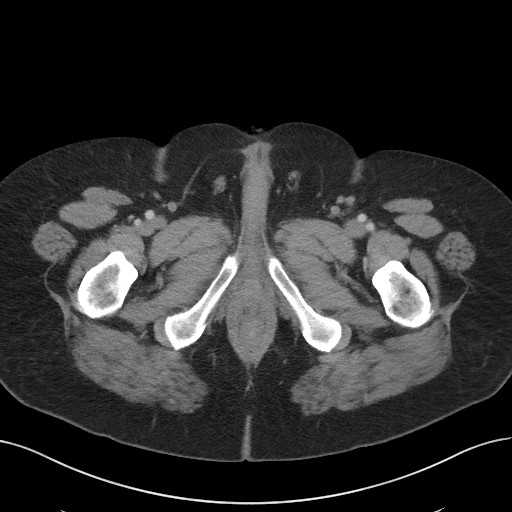
[im 7/89  bone]
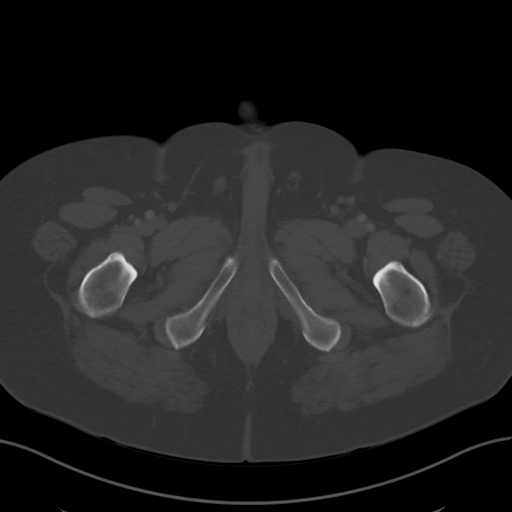
[im 14/89  soft-tissue]
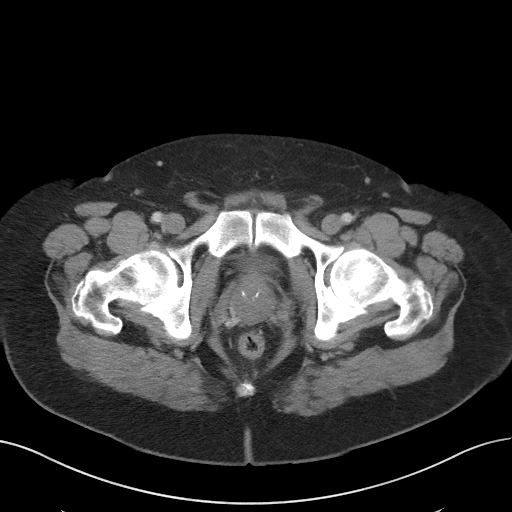
[im 21/89  soft-tissue]
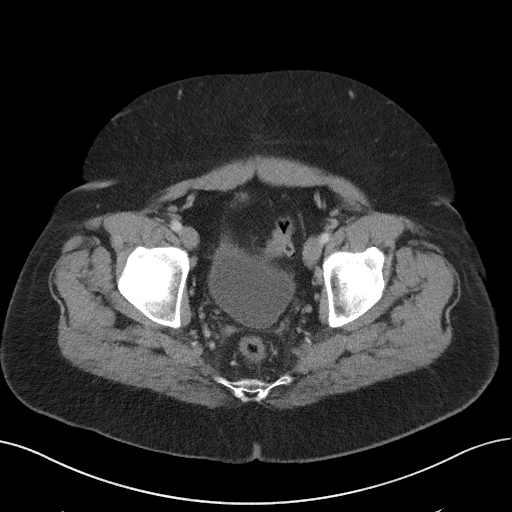
[im 28/89  soft-tissue]
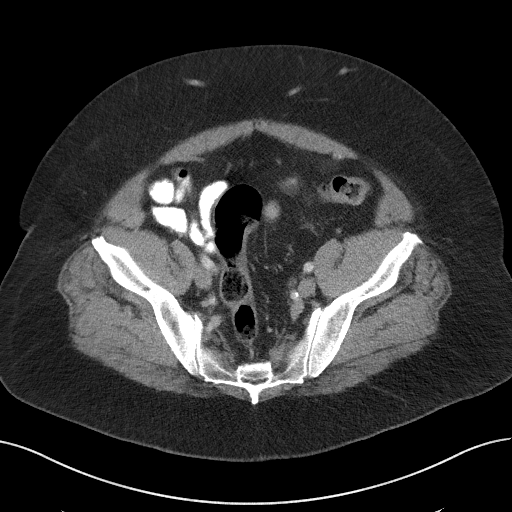
[im 34/89  soft-tissue]
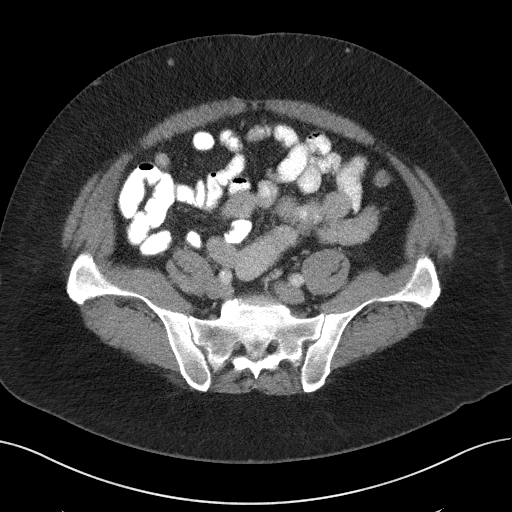
[im 41/89  soft-tissue]
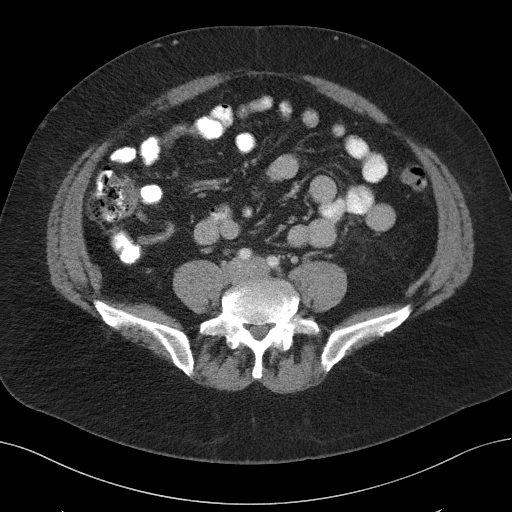
[im 48/89  soft-tissue]
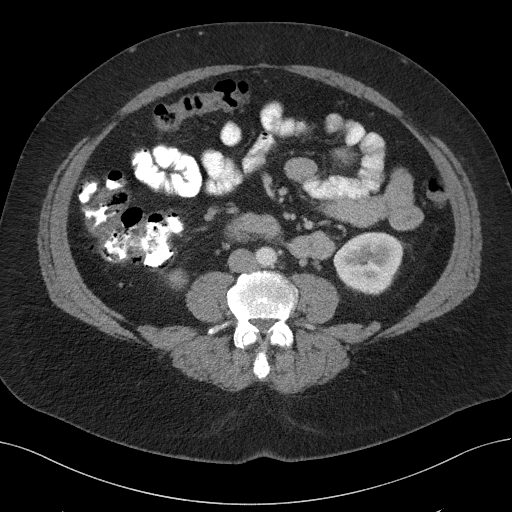
[im 55/89  soft-tissue]
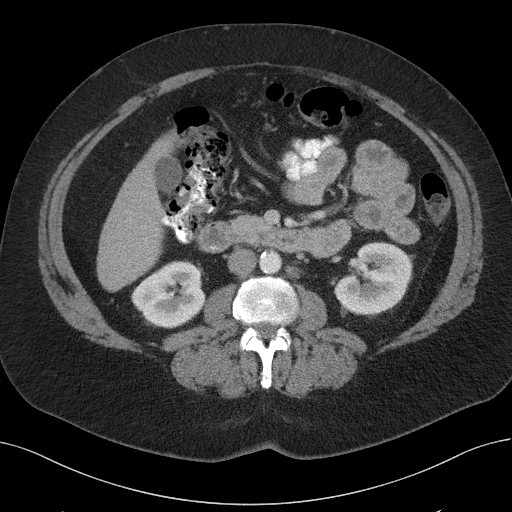
[im 61/89  soft-tissue]
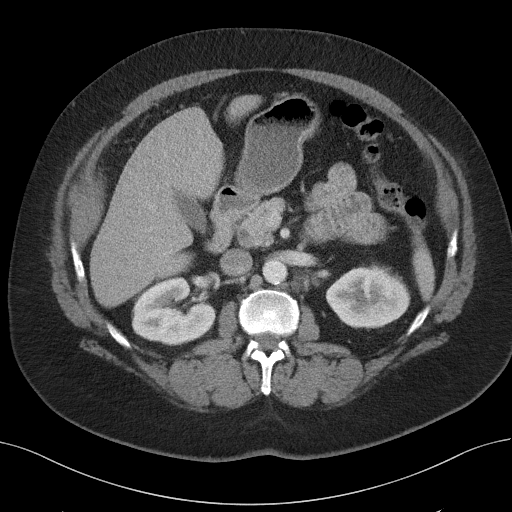
[im 61/89  bone]
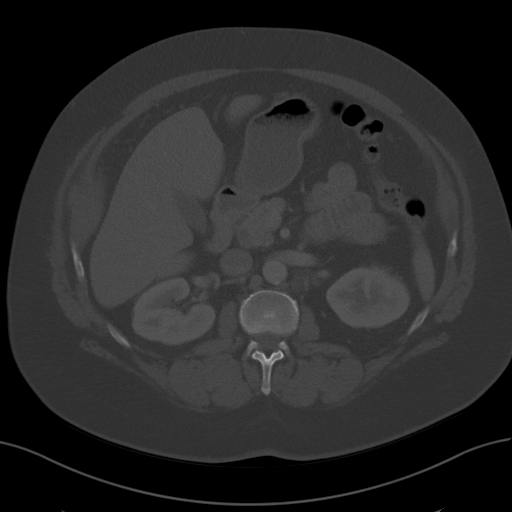
[im 68/89  soft-tissue]
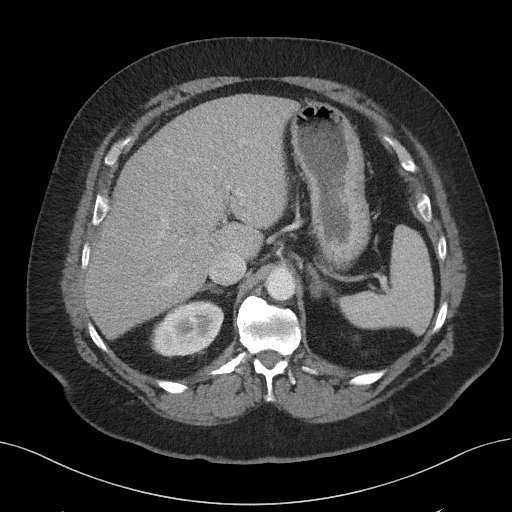
[im 75/89  soft-tissue]
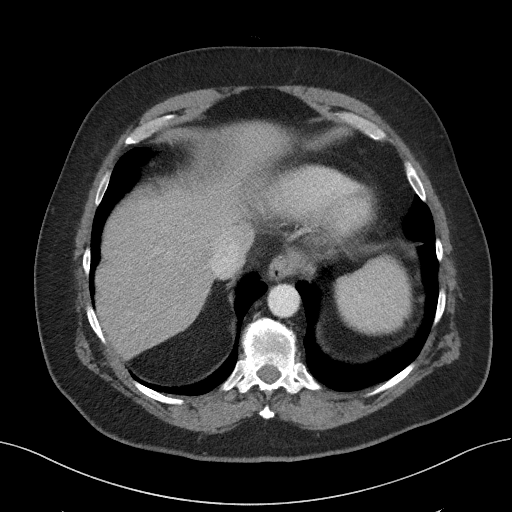
[im 82/89  soft-tissue]
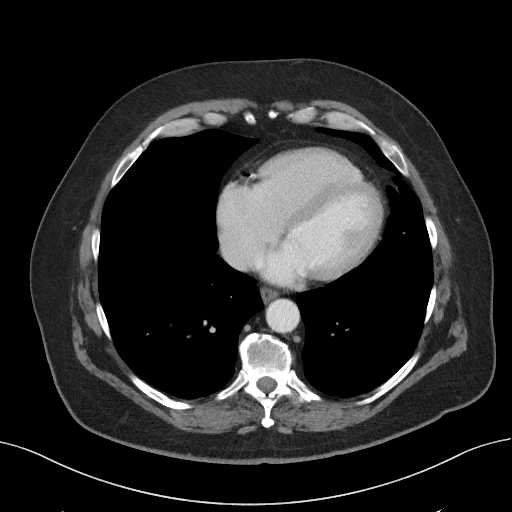

[Series 4: lung bases · axial · 0.79mm/px · z∈[-186,-174]mm · 2 of 65 slices shown]
[im 7/65  bone]
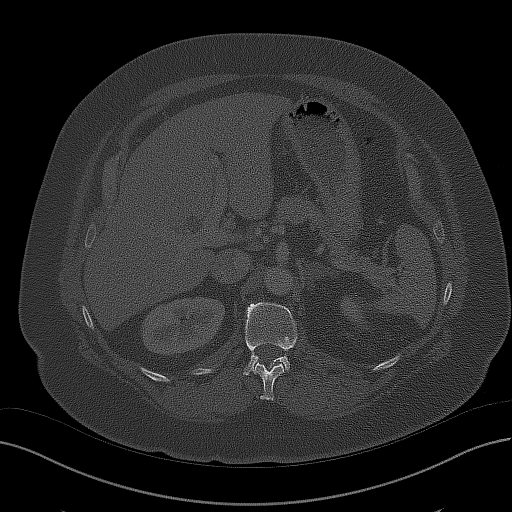
[im 13/65  bone]
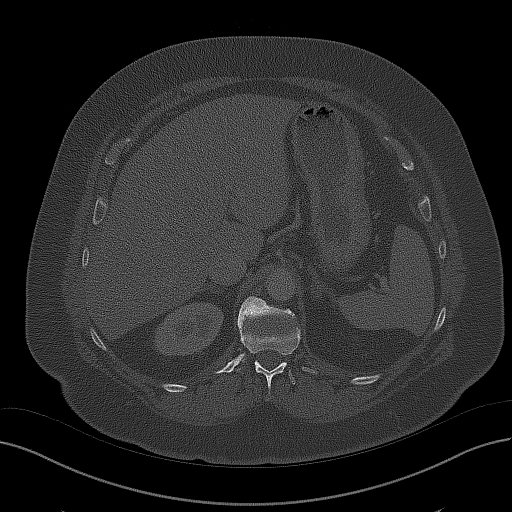

[Series 5: coronal st · coronal · 0.79mm/px · 3 of 108 slices shown]
[im 36/108  soft-tissue]
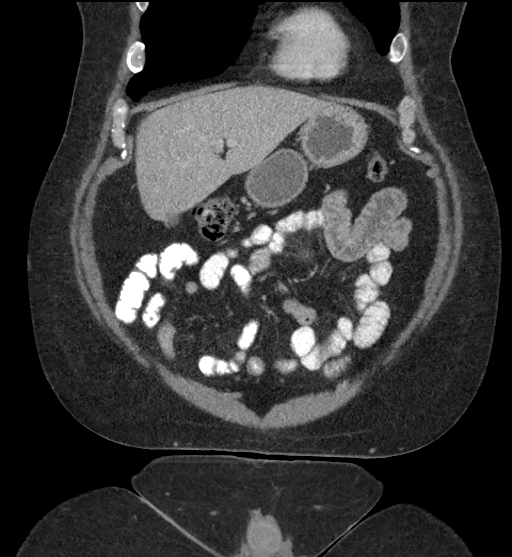
[im 48/108  soft-tissue]
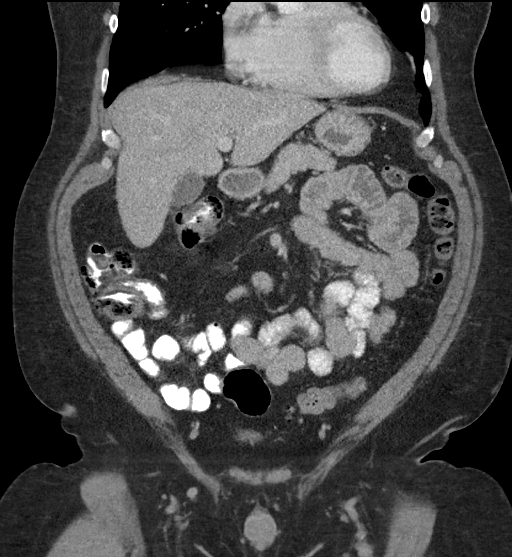
[im 60/108  soft-tissue]
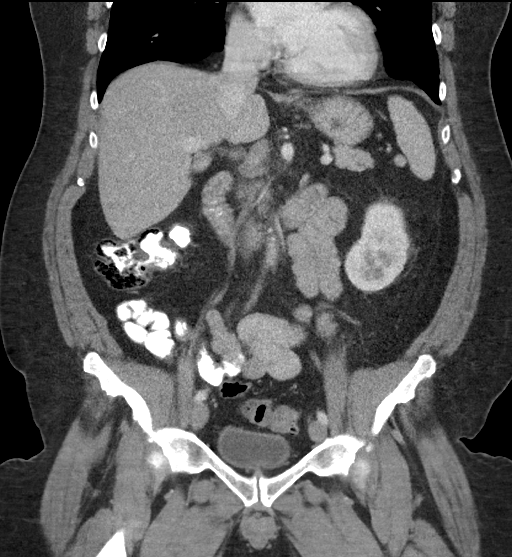

[17 of 46 positions shown; findings below may reference images not displayed]

FINDINGS: Lower chest: Lung bases are clear.

Hepatobiliary: No focal hepatic lesion. No biliary duct dilatation.
Gallbladder is normal. Common bile duct is normal.

Pancreas: Pancreas is normal. No ductal dilatation. No pancreatic
inflammation.

Spleen: Normal spleen

Adrenals/urinary tract: Adrenal glands and kidneys are normal. The
ureters and bladder normal.

Stomach/Bowel: Stomach, small bowel, appendix, and cecum are normal.
Several diverticular of the sigmoid colon without acute
inflammation.

Vascular/Lymphatic: Mild calcification abdominal aorta. Borderline
enlarged retroperitoneal lymph node adjacent to the aorta at the
level of the LEFT renal vein measures 12 mm short axis (image 33,
series 2. No iliac lymphadenopathy.

Reproductive: Prostate gland is grossly normal. Seminal vesicles are
normal by CT

Other: No free fluid.

Musculoskeletal: No aggressive osseous lesion.
IMPRESSION: 1. Solitary LEFT periaortic lymph node is para benign.
2. No pelvic lymphadenopathy.
3. No skeletal metastasis.

## 2018-07-24 MED FILL — !BREO ELLIPTA 100-25 MCG IN: 100-25 | 30 days supply | Qty: 60 | Fill #4

## 2018-07-24 MED FILL — BUPROPION HCL XL 300 MG TAB: 300 | 30 days supply | Qty: 30 | Fill #4

## 2018-08-14 ENCOUNTER — Ambulatory Visit: Payer: Self-pay | Admitting: Internal Medicine

## 2018-08-25 ENCOUNTER — Other Ambulatory Visit: Payer: Self-pay | Admitting: Family Medicine

## 2018-09-01 ENCOUNTER — Telehealth: Payer: Self-pay

## 2018-09-01 MED FILL — BREO ELLIPTA 100-25 MCG INH: 100-25 | 30 days supply | Qty: 60 | Fill #0

## 2018-09-01 NOTE — Telephone Encounter (Signed)
Called patient to do their pre-visit COVID screening.  Call went to voicemail. Unable to do prescreening.  

## 2018-09-04 ENCOUNTER — Ambulatory Visit: Payer: Self-pay | Admitting: Family Medicine

## 2018-09-08 ENCOUNTER — Telehealth: Payer: Self-pay

## 2018-09-08 NOTE — Telephone Encounter (Signed)

## 2018-09-11 ENCOUNTER — Encounter: Payer: Self-pay | Admitting: Family Medicine

## 2018-09-11 ENCOUNTER — Other Ambulatory Visit: Payer: Self-pay

## 2018-09-11 ENCOUNTER — Ambulatory Visit (INDEPENDENT_AMBULATORY_CARE_PROVIDER_SITE_OTHER): Payer: Self-pay | Admitting: Family Medicine

## 2018-09-11 VITALS — BP 149/81 | HR 68 | Temp 97.3°F | Resp 17 | Wt 241.0 lb

## 2018-09-11 DIAGNOSIS — R7303 Prediabetes: Secondary | ICD-10-CM

## 2018-09-11 DIAGNOSIS — J449 Chronic obstructive pulmonary disease, unspecified: Secondary | ICD-10-CM

## 2018-09-11 DIAGNOSIS — I10 Essential (primary) hypertension: Secondary | ICD-10-CM

## 2018-09-11 DIAGNOSIS — Z79899 Other long term (current) drug therapy: Secondary | ICD-10-CM

## 2018-09-11 DIAGNOSIS — E782 Mixed hyperlipidemia: Secondary | ICD-10-CM

## 2018-09-11 MED ORDER — ALBUTEROL SULFATE HFA 108 (90 BASE) MCG/ACT IN AERS: 2.0000 | INHALATION_SPRAY | Freq: Four times a day (QID) | RESPIRATORY_TRACT | 5 refills | Status: DC | PRN

## 2018-09-11 MED ORDER — BREO ELLIPTA 100-25 MCG/INH IN AEPB: INHALATION_SPRAY | RESPIRATORY_TRACT | 5 refills | Status: DC

## 2018-09-11 MED ORDER — ALBUTEROL SULFATE (2.5 MG/3ML) 0.083% IN NEBU: 2.5000 mg | INHALATION_SOLUTION | Freq: Four times a day (QID) | RESPIRATORY_TRACT | 12 refills | Status: DC | PRN

## 2018-09-11 MED ORDER — BENAZEPRIL-HYDROCHLOROTHIAZIDE 20-25 MG PO TABS: 1.0000 | ORAL_TABLET | Freq: Every day | ORAL | 1 refills | Status: DC

## 2018-09-11 MED ORDER — ATORVASTATIN CALCIUM 20 MG PO TABS: 20.0000 mg | ORAL_TABLET | Freq: Every day | ORAL | 1 refills | Status: DC

## 2018-09-11 MED FILL — PROAIR HFA 90 MCG INHALER: 108 (90 BAS | 25 days supply | Qty: 9 | Fill #0

## 2018-09-11 MED FILL — BREO ELLIPTA 100-25 MCG INH: 100-25 | 30 days supply | Qty: 60 | Fill #0

## 2018-09-11 MED FILL — BENAZEPRIL-HYDROCHLOROTHIAZ: 20-25 | 30 days supply | Qty: 30 | Fill #0

## 2018-09-11 MED FILL — ATORVASTATIN 20 MG TABLET: 20 | 30 days supply | Qty: 30 | Fill #0

## 2018-09-11 NOTE — Patient Instructions (Signed)

## 2018-09-11 NOTE — Progress Notes (Signed)
Established Patient Office Visit  Subjective:  Patient ID: Robert [name], male    DOB: [birth year]  Age: 63 y.o. MRN: [id number]  CC:  Chief Complaint  Patient presents with  . Prediabetes  . Hypertension  . Hyperlipidemia    HPI Robert [name] presents in follow-up of chronic medical issues including Hypertension, Prediabetes and COPD. He reports being unaware of being prescribed atorvastatin in the past.  He reports that he has been able to decrease his tobacco use down to about 8 cigarettes/day.  He states that he is changed jobs and his current job does not allow smoking at the work site which has helped decrease his cigarette use and he finds that he does not really feel the need to smoke after work.  He reports that his COPD is manageable/controlled with his current medication.  He would like to have a refill of albuterol inhaler, albuterol nebulizer and needs a refill of his Brio Ellipta.  He has had a recent increase in cough but cough is nonproductive and he has no current sensation of chest congestion.  He denies any recent fever or chills.       He denies any issues with hypertension related to his blood pressure but he has been out of blood pressure medication for a few weeks.  He does recall having a lipid panel earlier this year but was not aware of the need to take cholesterol medication.  Patient agrees to have prescription filled for atorvastatin that was ordered for patient in the past by another provider.  Patient also has prediabetes but denies any increased thirst or urinary frequency, no increased fatigue and no blurred vision related to diabetes.  He states that overall he feels well.  He does have a history additionally of prostate cancer and denies any current urinary symptoms.  Past Medical History:  Diagnosis Date  . 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 Elevated hemoglobin A1c   . Elevated PSA 03/16/2016  . Hyperlipidemia   . Hypertension   . Personal history of  colonic adenomas 12/28/2007  . Pre-diabetes   . Prostate cancer ([place]) 03/16/2016  . Vitamin D deficiency     Past Surgical History:  Procedure Laterality Date  . COLONOSCOPY    . LYMPHADENECTOMY N/A 06/28/2016   Procedure: LYMPHADENECTOMY;  Surgeon: Raynelle Bring, MD;  Location: [facility];  Service: Urology;  Laterality: N/A;  . PROSTATE BIOPSY  03/16/2016   Surgical Pathology Gross and Microscopic Examination for Prostate Needle  . ROBOT ASSISTED LAPAROSCOPIC RADICAL PROSTATECTOMY N/A 06/28/2016   Procedure: XI ROBOTIC ASSISTED LAPAROSCOPIC RADICAL PROSTATECTOMY LEVEL 2 EXCISION OF PENILE LESION;  Surgeon: Raynelle Bring, MD;  Location: [facility];  Service: Urology;  Laterality: N/A;    Family History  Problem Relation Age of Onset  . Cancer Mother        lung  . Lung disease Neg Hx     Social History   Tobacco Use  . Smoking status: Current Every Day Smoker    Packs/day: 0.25    Years: 40.00    Pack years: 10.00    Types: Cigarettes  . Smokeless tobacco: Never Used  . Tobacco comment: has decreased smoking significantly with goal of quitting  Substance Use Topics  . Alcohol use: Yes    Alcohol/week: 4.0 standard drinks    Types: 4 Cans of beer per week  . Drug use: No    ROS Review of Systems  Constitutional: Positive for fatigue (occasional, mild). Negative for  chills and fever.  HENT: Negative for sore throat and trouble swallowing.   Eyes: Negative for photophobia and visual disturbance.  Respiratory: Positive for cough (occasional). Negative for shortness of breath.   Cardiovascular: Negative for chest pain, palpitations and leg swelling.  Gastrointestinal: Negative for abdominal pain, blood in stool, constipation, diarrhea and nausea.  Endocrine: Negative for polydipsia, polyphagia and polyuria.  Genitourinary: Negative for difficulty urinating, dysuria and flank pain.  Musculoskeletal: Negative for back pain.  Neurological: Negative for dizziness and headaches.   Hematological: Negative for adenopathy. Does not bruise/bleed easily.    Objective:   Today's Vitals: BP (!) 149/81   Pulse 68   Temp (!) 97.3 F (36.3 C) (Temporal)   Resp 17   Wt 241 lb (109.3 kg)   SpO2 90%   BMI 38.32 kg/m   Physical Exam Vitals signs and nursing note reviewed.  Constitutional:      Appearance: Normal appearance. He is obese.  Neck:     Musculoskeletal: Normal range of motion and neck supple. No neck rigidity.     Vascular: No carotid bruit.  Cardiovascular:     Rate and Rhythm: Normal rate and regular rhythm.  Pulmonary:     Effort: Pulmonary effort is normal.     Breath sounds: Normal breath sounds. No wheezing.  Abdominal:     Palpations: Abdomen is soft.     Tenderness: There is no abdominal tenderness. There is no right CVA tenderness, left CVA tenderness, guarding or rebound.     Comments: Truncal obesity  Musculoskeletal:        General: No tenderness or deformity.     Right lower leg: No edema.  Lymphadenopathy:     Cervical: No cervical adenopathy.  Skin:    General: Skin is warm and dry.  Neurological:     General: No focal deficit present.     Mental Status: He is alert and oriented to person, place, and time.  Psychiatric:        Mood and Affect: Mood normal.        Behavior: Behavior normal.     Assessment & Plan:  1. Prediabetes Last hemoglobin A1c was done 02/20/2018 and was 5.9.  Patient will have repeat hemoglobin A1c at today's visit.  He denies any current symptoms of diabetes such as increased thirst or urinary frequency.  He will be notified the results and if higher than at his last visit, he will be offered the chance to start metformin to help with insulin resistance.  Patient will have CMP in approximately 2 months as well as lipid panel.  Healthy, low carbohydrate diet and regular exercise encouraged. - Hemoglobin A1c - Comprehensive metabolic panel; Future  2. Essential hypertension Blood pressure was slightly  elevated at today's visit but he reports that he has been out of his medication.  New prescription provided for benazepril HCTZ and patient encouraged to return for nurse visit for blood pressure recheck in the next 2 to 3 weeks. - benazepril-hydrochlorthiazide (LOTENSIN HCT) 20-25 MG tablet; Take 1 tablet by mouth daily.  Dispense: 90 tablet; Refill: 1 - Lipid Panel; Future  3. Mixed hyperlipidemia Patient reports that he was unaware that he had been prescribed atorvastatin in the past.  New prescription provided for atorvastatin at today's visit and patient will return in approximately 2 months for CMP in follow-up of use of atorvastatin.  He will also have repeat lipid panel at that time.  Last lipid panel was done on March 06, 2018 and patient with LDL that was elevated at 122. - atorvastatin (LIPITOR) 20 MG tablet; Take 1 tablet (20 mg total) by mouth daily. To lower cholesterol  Dispense: 90 tablet; Refill: 1 - Comprehensive metabolic panel; Future - Lipid Panel; Future  4. Chronic obstructive pulmonary disease, unspecified COPD type ([place]) He reports that he has decreased his number of daily cigarettes smoked and has eventual goal of complete smoking cessation.  New prescription provided for albuterol inhaler and albuterol per nebulizer which he sometimes takes after work hours.  Patient also given refill of Breo Ellipta.  Patient was offered but declined influenza immunization at today's visit - albuterol (PROAIR HFA) 108 (90 Base) MCG/ACT inhaler; Inhale 2 puffs into the lungs every 6 (six) hours as needed for wheezing or shortness of breath.  Dispense: 18 g; Refill: 5 - fluticasone furoate-vilanterol (BREO ELLIPTA) 100-25 MCG/INH AEPB; INHALE ONE DOSE BY MOUTH INTO THE LUNGS DAILY  Dispense: 60 each; Refill: 5 - albuterol (PROVENTIL) (2.5 MG/3ML) 0.083% nebulizer solution; Take 3 mLs (2.5 mg total) by nebulization every 6 (six) hours as needed for wheezing or shortness of breath.  Dispense:  75 mL; Refill: 12  5. Encounter for long-term (current) use of medications Patient will return in approximately 2 months to have comprehensive metabolic panel done in follow-up of long-term use of medications. - Comprehensive metabolic panel; Future   Outpatient Encounter Medications as of 09/11/2018  Medication Sig  . albuterol (PROAIR HFA) 108 (90 Base) MCG/ACT inhaler Inhale 2 puffs into the lungs every 6 (six) hours as needed for wheezing or shortness of breath.  Marland Kitchen aspirin EC 81 MG tablet Take 81 mg by mouth daily.  . benazepril-hydrochlorthiazide (LOTENSIN HCT) 20-25 MG tablet TAKE ONE TABLET BY MOUTH DAILY FOR BLOOD PRESSURE (Patient taking differently: Take 1 tablet by mouth daily. )  . BREO ELLIPTA 100-25 MCG/INH AEPB INHALE ONE DOSE BY MOUTH INTO THE LUNGS DAILY  . buPROPion (WELLBUTRIN XL) 300 MG 24 hr tablet Take 1 tablet (300 mg total) by mouth daily.  Marland Kitchen albuterol (PROVENTIL) (2.5 MG/3ML) 0.083% nebulizer solution Take 3 mLs (2.5 mg total) by nebulization every 6 (six) hours as needed for wheezing or shortness of breath. (Patient not taking: Reported on 09/11/2018)  . atorvastatin (LIPITOR) 20 MG tablet Take 1 tablet (20 mg total) by mouth daily. (Patient not taking: Reported on 09/11/2018)  . Magnesium 500 MG TABS Take 500 mg by mouth daily.  . [DISCONTINUED] Nebulizers (COMPRESSOR/NEBULIZER) MISC 1 each by Does not apply route as needed.   No facility-administered encounter medications on file as of 09/11/2018.    An After Visit Summary was printed and given to the patient.  Follow-up: Return in about 5 months (around 02/11/2019) for lab visit in 2 months; 5 month chronic issues.   Antony Blackbird, MD

## 2018-09-12 LAB — HEMOGLOBIN A1C
Est. average glucose Bld gHb Est-mCnc: 117 mg/dL
Hgb A1c MFr Bld: 5.7 % — ABNORMAL HIGH (ref 4.8–5.6)

## 2018-09-12 MED FILL — ALBUTEROL SUL 2.5 MG/3 ML S: (2.5 MG/3ML | 6 days supply | Qty: 75 | Fill #0

## 2018-09-26 ENCOUNTER — Encounter: Payer: Self-pay | Admitting: *Deleted

## 2018-09-28 ENCOUNTER — Other Ambulatory Visit: Payer: Self-pay

## 2018-09-28 ENCOUNTER — Ambulatory Visit
Admission: EM | Admit: 2018-09-28 | Discharge: 2018-09-28 | Disposition: A | Discharge status: Home / Self Care | Payer: Self-pay | Attending: Emergency Medicine | Admitting: Emergency Medicine

## 2018-09-28 DIAGNOSIS — B9689 Other specified bacterial agents as the cause of diseases classified elsewhere: Secondary | ICD-10-CM

## 2018-09-28 DIAGNOSIS — L02212 Cutaneous abscess of back [any part, except buttock]: Secondary | ICD-10-CM

## 2018-09-28 MED ORDER — DOXYCYCLINE HYCLATE 100 MG PO CAPS: 100.0000 mg | ORAL_CAPSULE | Freq: Two times a day (BID) | ORAL | 0 refills | Status: DC

## 2018-09-28 NOTE — ED Notes (Signed)
Pt's bandage removed from abscess. White purulent drainage present. Bandage had dark brown cream which patient states he has been placing on it under a bandage.

## 2018-09-28 NOTE — ED Provider Notes (Signed)
[facility]    CSN: [id number] Arrival date & time: 09/28/18  1514      History   Chief Complaint Chief Complaint  Patient presents with  . Abscess    leaking    HPI Robert [name] is a 63 y.o. male with history of prediabetes, hypertension, morbid obesity presenting for leaking abscess on his upper back.  States that he noticed swelling on his upper back about 2 to 3 weeks ago, though noticed an abscess about a week ago.  States he has been trying topical creams, hot compress once daily without significant relief.  States he has had to have antibiotics for abscesses in the past.  Denies previous abscess in the specific spot.  Denies neck stiffness, decreased range of motion, fever.  Past Medical History:  Diagnosis Date  . 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 Elevated hemoglobin A1c   . Elevated PSA 03/16/2016  . Hyperlipidemia   . Hypertension   . Personal history of colonic adenomas 12/28/2007  . Pre-diabetes   . Prostate cancer ([place]) 03/16/2016  . Vitamin D deficiency     Patient Active Problem List   Diagnosis Date Noted  . COPD exacerbation ([place]) 01/29/2018  . FH: hypertension 09/19/2017  . 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 Prediabetes 07/22/2016  . Smoker   . Prostate cancer ([place]) 06/28/2016  . Malignant neoplasm of prostate ([place]) 05/20/2016  . Medication management 07/03/2014  . Encounter for long-term (current) use of medications 12/18/2013  . Obesity (BMI 35.55) 03/19/2013  . Testosterone deficiency 12/05/2012  . Essential hypertension   . Hyperlipidemia, mixed   . Vitamin D deficiency   . Elevated hemoglobin A1c   . Personal history of colonic adenomas 12/28/2007    Past Surgical History:  Procedure Laterality Date  . COLONOSCOPY    . LYMPHADENECTOMY N/A 06/28/2016   Procedure: LYMPHADENECTOMY;  Surgeon: Raynelle Bring, MD;  Location: [facility];  Service: Urology;  Laterality: N/A;  . PROSTATE BIOPSY  03/16/2016   Surgical Pathology Gross  and Microscopic Examination for Prostate Needle  . ROBOT ASSISTED LAPAROSCOPIC RADICAL PROSTATECTOMY N/A 06/28/2016   Procedure: XI ROBOTIC ASSISTED LAPAROSCOPIC RADICAL PROSTATECTOMY LEVEL 2 EXCISION OF PENILE LESION;  Surgeon: Raynelle Bring, MD;  Location: [facility];  Service: Urology;  Laterality: N/A;       Home Medications    Prior to Admission medications   Medication Sig Start Date End Date Taking? Authorizing Provider  albuterol (PROAIR HFA) 108 (90 Base) MCG/ACT inhaler Inhale 2 puffs into the lungs every 6 (six) hours as needed for wheezing or shortness of breath. 09/11/18   Fulp, Cammie, MD  albuterol (PROVENTIL) (2.5 MG/3ML) 0.083% nebulizer solution Take 3 mLs (2.5 mg total) by nebulization every 6 (six) hours as needed for wheezing or shortness of breath. 09/11/18   Fulp, Cammie, MD  aspirin EC 81 MG tablet Take 81 mg by mouth daily.    [provider]  atorvastatin (LIPITOR) 20 MG tablet Take 1 tablet (20 mg total) by mouth daily. To lower cholesterol 09/11/18   Fulp, Cammie, MD  benazepril-hydrochlorthiazide (LOTENSIN HCT) 20-25 MG tablet Take 1 tablet by mouth daily. 09/11/18   Fulp, Cammie, MD  buPROPion (WELLBUTRIN XL) 300 MG 24 hr tablet Take 1 tablet (300 mg total) by mouth daily. 03/06/18   Scot Jun, FNP  doxycycline (VIBRAMYCIN) 100 MG capsule Take 1 capsule (100 mg total) by mouth 2 (two) times daily. 09/28/18   Hall-Potvin, [place], PA-C  fluticasone furoate-vilanterol (BREO  ELLIPTA) 100-25 MCG/INH AEPB INHALE ONE DOSE BY MOUTH INTO THE LUNGS DAILY 09/11/18   Fulp, Cammie, MD  Magnesium 500 MG TABS Take 500 mg by mouth daily.    [provider]    Family History Family History  Problem Relation Age of Onset  . Cancer Mother        lung  . Lung disease Neg Hx     Social History Social History   Tobacco Use  . Smoking status: Current Every Day Smoker    Packs/day: 0.25    Years: 40.00    Pack years: 10.00    Types: Cigarettes  .  Smokeless tobacco: Never Used  . Tobacco comment: has decreased smoking significantly with goal of quitting  Substance Use Topics  . Alcohol use: Yes    Alcohol/week: 4.0 standard drinks    Types: 4 Cans of beer per week    Comment: occasionally  . Drug use: No     Allergies   Codeine and Isovue [iopamidol]   Review of Systems Review of Systems  Constitutional: Negative for fatigue and fever.  Respiratory: Negative for cough and shortness of breath.   Cardiovascular: Negative for chest pain and palpitations.  Gastrointestinal: Negative for abdominal pain, diarrhea and vomiting.  Musculoskeletal: Negative for arthralgias and myalgias.  Skin: Positive for wound. Negative for rash.  Neurological: Negative for speech difficulty and headaches.  All other systems reviewed and are negative.    Physical Exam Triage Vital Signs ED Triage Vitals  Enc Vitals Group     BP      Pulse      Resp      Temp      Temp src      SpO2      Weight      Height      Head Circumference      Peak Flow      Pain Score      Pain Loc      Pain Edu?      Excl. in [place]?    No data found.  Updated Vital Signs BP 120/72 (BP Location: Left Arm)   Pulse 79   Temp 98.5 F (36.9 C) (Oral)   Resp 16   SpO2 92%   Visual Acuity Right Eye Distance:   Left Eye Distance:   Bilateral Distance:    Right Eye Near:   Left Eye Near:    Bilateral Near:     Physical Exam Constitutional:      General: He is not in acute distress. HENT:     Head: Normocephalic and atraumatic.     Mouth/Throat:     Mouth: Mucous membranes are moist.     Pharynx: Oropharynx is clear.  Eyes:     General: No scleral icterus.    Conjunctiva/sclera: Conjunctivae normal.     Pupils: Pupils are equal, round, and reactive to light.  Neck:     Musculoskeletal: Normal range of motion and neck supple. No muscular tenderness.  Cardiovascular:     Rate and Rhythm: Normal rate.  Pulmonary:     Effort: Pulmonary effort  is normal.  Lymphadenopathy:     Cervical: No cervical adenopathy.  Skin:    Coloration: Skin is not jaundiced or pale.     Comments: 8 cm lesion of induration with left of center fluctuance.  Open wound on right with purulent discharge.  Area is warm, TTP  Neurological:     Mental Status: He is  alert and oriented to person, place, and time.      UC Treatments / Results  Labs (all labs ordered are listed, but only abnormal results are displayed) Labs Reviewed - No data to display  EKG   Radiology No results found.  Procedures Incision and Drainage  Date/Time: 09/28/2018 4:14 PM Performed by: Quincy Sheehan, PA-C Authorized by: Quincy Sheehan, PA-C   Consent:    Consent obtained:  Verbal   Consent given by:  Patient   Risks discussed:  Bleeding, incomplete drainage, pain, damage to other organs and infection   Alternatives discussed:  No treatment and delayed treatment Universal protocol:    Patient identity confirmed:  Verbally with patient Location:    Type:  Abscess   Size:  8 cm   Location: upper back, intrascapular. Pre-procedure details:    Skin preparation:  Chloraprep Anesthesia (see MAR for exact dosages):    Anesthesia method:  Local infiltration   Local anesthetic:  Lidocaine 2% WITH epi Procedure type:    Complexity:  Complex Procedure details:    Incision types:  Single straight   Incision depth:  Subcutaneous   Scalpel blade:  11   Wound management:  Probed and deloculated, irrigated with saline and extensive cleaning   Drainage:  Purulent, bloody and serosanguinous   Drainage amount:  Copious   Wound treatment:  Wound left open   Packing materials:  None Post-procedure details:    Patient tolerance of procedure:  Tolerated well, no immediate complications   (including critical care time)  Medications Ordered in UC Medications - No data to display  Initial Impression / Assessment and Plan / UC Course  I have reviewed the  triage vital signs and the nursing notes.  Pertinent labs & imaging results that were available during my care of the patient were reviewed by me and considered in my medical decision making (see chart for details).     1.  Abscess of upper back excluding scapular region Large, loculated, complex abscess with intralesional fistula(s).  Successful I&D done in office for pain relief: patient tolerated well.  Patient to start doxycycline today, follow-up with surgery next week for further management.  Return precautions discussed, patient verbalized understanding and is agreeable to plan. Final Clinical Impressions(s) / UC Diagnoses   Final diagnoses:  Abscess of upper back excluding scapular region     Discharge Instructions     Keep the area clean and dry. Use hot compresses for 5 minutes 3-4 times a day. Take antibiotic as prescribed with food.    ED Prescriptions    Medication Sig Dispense Auth. Provider   doxycycline (VIBRAMYCIN) 100 MG capsule Take 1 capsule (100 mg total) by mouth 2 (two) times daily. 20 capsule Hall-Potvin, [place], PA-C     PDMP not reviewed this encounter.   Hall-Potvin, [place], [place] 09/28/18 1615

## 2018-09-28 NOTE — Discharge Instructions (Signed)
Keep the area clean and dry. Use hot compresses for 5 minutes 3-4 times a day. Take antibiotic as prescribed with food.

## 2018-09-28 NOTE — ED Triage Notes (Signed)
Pt presents to UC w/ c/o leaking abscess on upper back x1 week. Pt states he's been putting a dark cream on it and covering with bandages. Pt states he has had abscesses in the past. Abscess is open and oozing white drainage.

## 2018-09-29 ENCOUNTER — Telehealth: Payer: Self-pay

## 2018-10-03 ENCOUNTER — Ambulatory Visit: Payer: Self-pay | Admitting: Internal Medicine

## 2018-10-10 MED FILL — BENAZEPRIL-HYDROCHLOROTHIAZ: 20-25 | 30 days supply | Qty: 30 | Fill #1

## 2018-10-10 MED FILL — BREO ELLIPTA 100-25 MCG INH: 100-25 | 30 days supply | Qty: 60 | Fill #1

## 2018-10-10 MED FILL — ATORVASTATIN 20 MG TABLET: 20 | 30 days supply | Qty: 30 | Fill #1

## 2018-10-16 ENCOUNTER — Ambulatory Visit: Payer: Self-pay

## 2018-10-23 ENCOUNTER — Ambulatory Visit: Payer: Self-pay

## 2018-10-26 IMAGING — DX DG HAND COMPLETE 3+V*L*
3 series · 3 of 3 positions shown · non-contrast
Comparison: None.

CLINICAL DATA: Snake bite left third digit

EXAM:
LEFT HAND - COMPLETE 3+ VIEW

[x hand pa left]
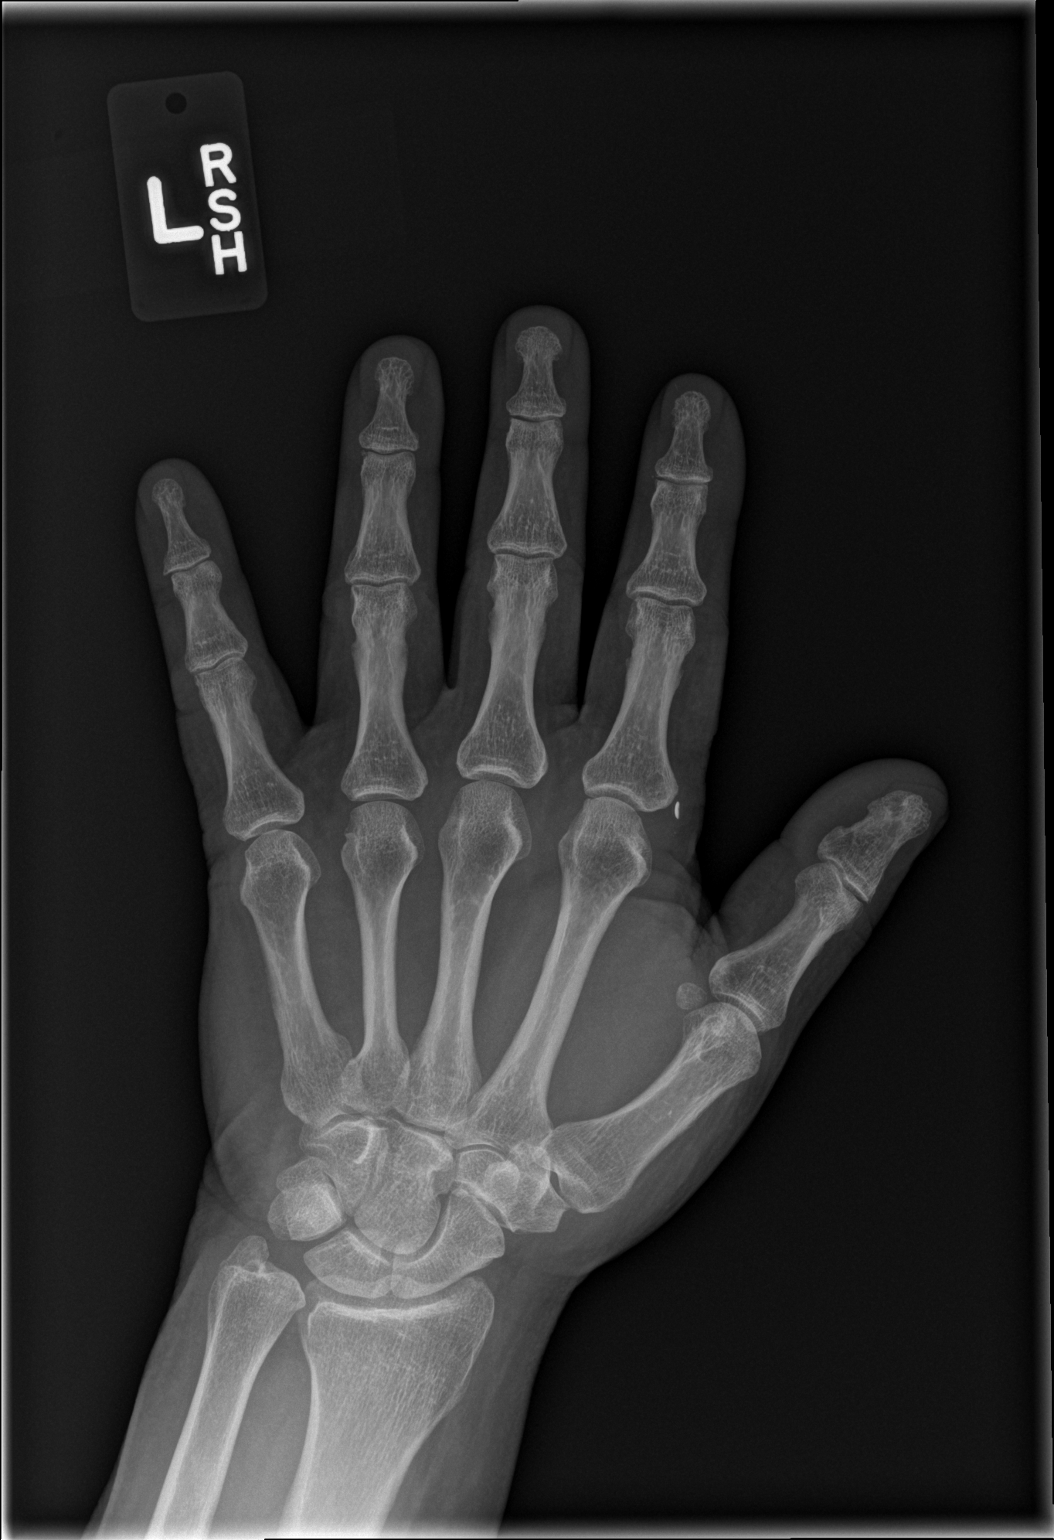

[x hand obl left]
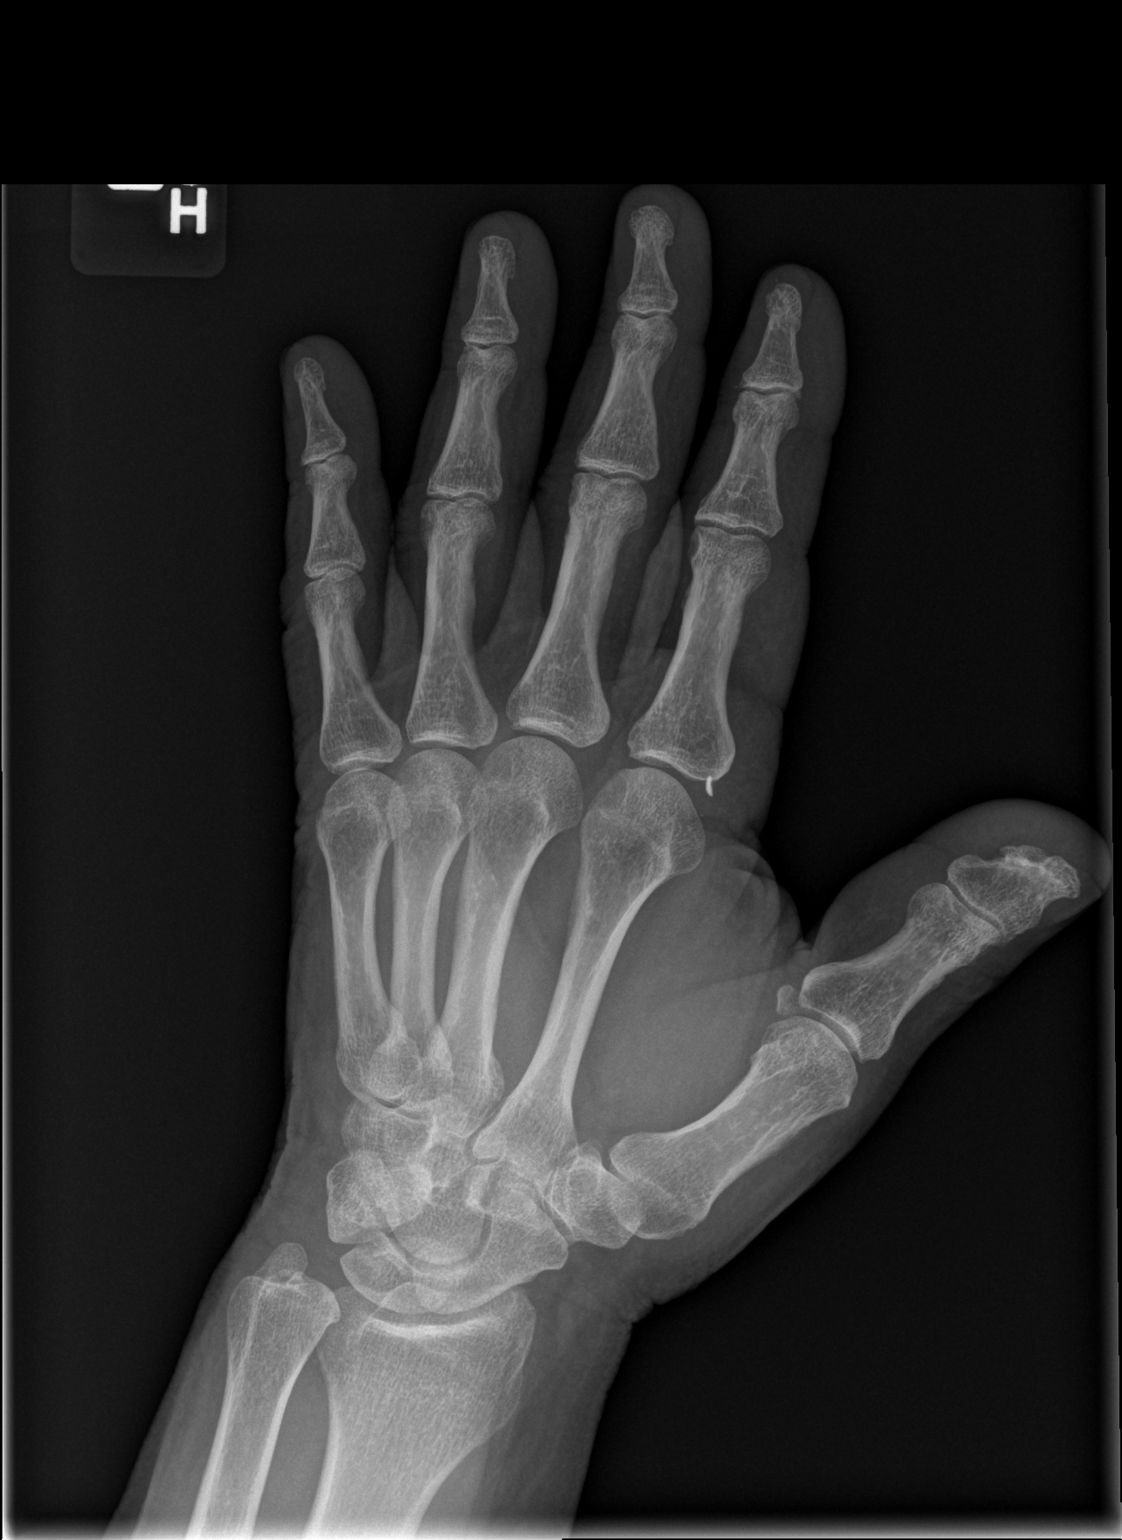

[x hand lat left]
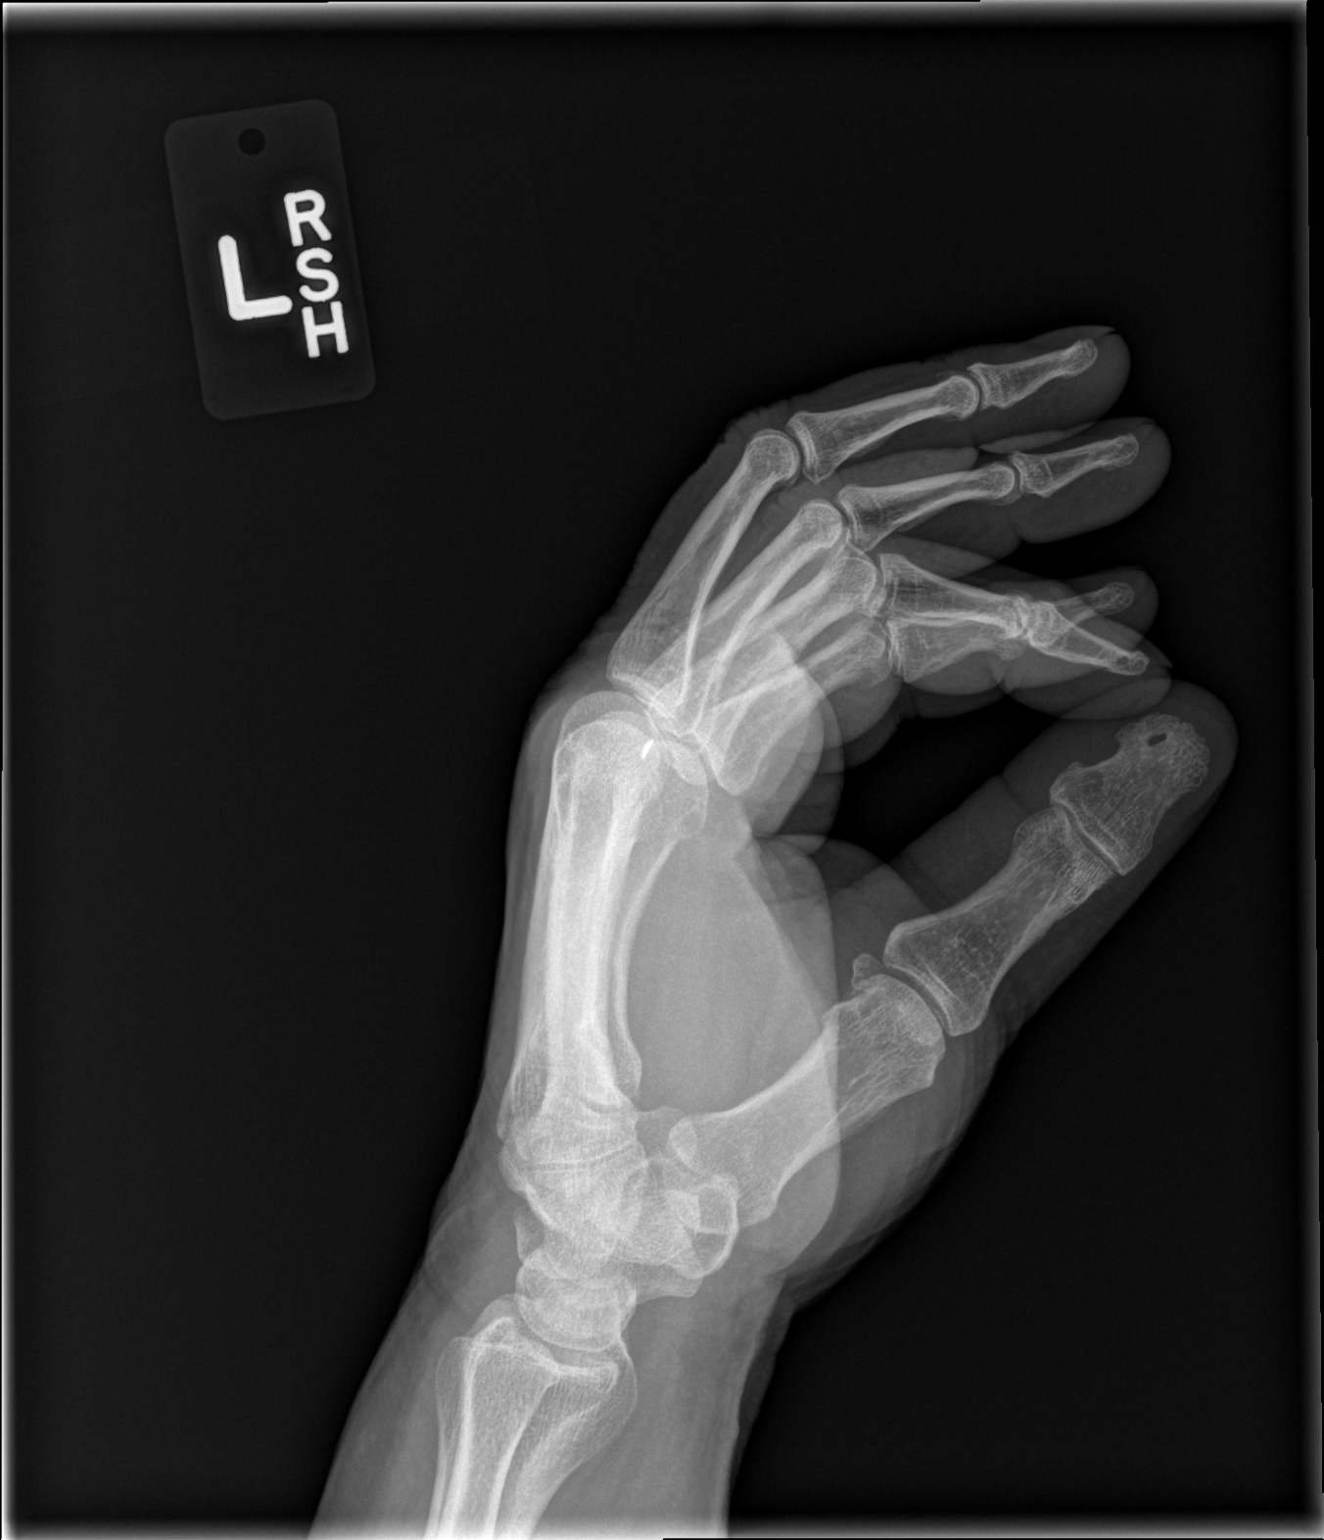

[3 of 3 positions shown; findings below may reference images not displayed]

FINDINGS: Small radiopaque soft tissue foreign body noted along the left
second MCP joint.

Normal alignment without acute osseous finding, fracture,
subluxation, or dislocation. No significant joint abnormality. No
acute finding at the wrist.
IMPRESSION: Small punctate subcentimeter radiopaque foreign body along the left
second MCP joint.

No other acute osseous finding.

## 2018-11-06 ENCOUNTER — Other Ambulatory Visit: Payer: Self-pay

## 2018-11-06 DIAGNOSIS — Z79899 Other long term (current) drug therapy: Secondary | ICD-10-CM

## 2018-11-06 DIAGNOSIS — I10 Essential (primary) hypertension: Secondary | ICD-10-CM

## 2018-11-06 DIAGNOSIS — E782 Mixed hyperlipidemia: Secondary | ICD-10-CM

## 2018-11-06 DIAGNOSIS — R7303 Prediabetes: Secondary | ICD-10-CM

## 2018-11-06 NOTE — Progress Notes (Signed)
Patient here for repeat labs. 

## 2018-11-07 ENCOUNTER — Telehealth: Payer: Self-pay | Admitting: Family Medicine

## 2018-11-07 LAB — COMPREHENSIVE METABOLIC PANEL WITH GFR
ALT: 19 IU/L (ref 0–44)
AST: 16 IU/L (ref 0–40)
Albumin/Globulin Ratio: 1.9 (ref 1.2–2.2)
Albumin: 4.2 g/dL (ref 3.8–4.8)
Alkaline Phosphatase: 85 IU/L (ref 39–117)
BUN/Creatinine Ratio: 23 (ref 10–24)
BUN: 16 mg/dL (ref 8–27)
Bilirubin Total: 0.2 mg/dL (ref 0.0–1.2)
CO2: 27 mmol/L (ref 20–29)
Calcium: 9.5 mg/dL (ref 8.6–10.2)
Chloride: 97 mmol/L (ref 96–106)
Creatinine, Ser: 0.71 mg/dL — ABNORMAL LOW (ref 0.76–1.27)
GFR calc Af Amer: 117 mL/min/1.73
GFR calc non Af Amer: 101 mL/min/1.73
Globulin, Total: 2.2 g/dL (ref 1.5–4.5)
Glucose: 102 mg/dL — ABNORMAL HIGH (ref 65–99)
Potassium: 4.2 mmol/L (ref 3.5–5.2)
Sodium: 137 mmol/L (ref 134–144)
Total Protein: 6.4 g/dL (ref 6.0–8.5)

## 2018-11-07 LAB — LIPID PANEL
Chol/HDL Ratio: 2.4 ratio (ref 0.0–5.0)
Cholesterol, Total: 146 mg/dL (ref 100–199)
HDL: 60 mg/dL
LDL Chol Calc (NIH): 68 mg/dL (ref 0–99)
Triglycerides: 99 mg/dL (ref 0–149)
VLDL Cholesterol Cal: 18 mg/dL (ref 5–40)

## 2018-11-07 MED FILL — !BREO ELLIPTA 100-25 MCG IN: 100-25 | 30 days supply | Qty: 60 | Fill #2

## 2018-11-08 NOTE — Progress Notes (Signed)
Normal lab letter mailed.

## 2018-11-08 NOTE — Telephone Encounter (Signed)
Patient no longer using [facility] pharmacy, now using Kristopher Oppenheim at [facility].

## 2018-11-20 ENCOUNTER — Other Ambulatory Visit: Payer: Self-pay | Admitting: Internal Medicine

## 2018-11-20 DIAGNOSIS — I10 Essential (primary) hypertension: Secondary | ICD-10-CM

## 2019-02-02 ENCOUNTER — Telehealth: Payer: Self-pay

## 2019-02-02 NOTE — Telephone Encounter (Signed)
Called patient to do their pre-visit COVID screening.  Call went to voicemail. Unable to do prescreening.  

## 2019-02-05 ENCOUNTER — Ambulatory Visit (INDEPENDENT_AMBULATORY_CARE_PROVIDER_SITE_OTHER): Payer: BC Managed Care – PPO | Admitting: Family Medicine

## 2019-02-05 ENCOUNTER — Other Ambulatory Visit: Payer: Self-pay

## 2019-02-05 VITALS — BP 118/74 | HR 73 | Temp 98.0°F | Resp 18 | Ht 65.0 in | Wt 252.0 lb

## 2019-02-05 DIAGNOSIS — I10 Essential (primary) hypertension: Secondary | ICD-10-CM | POA: Diagnosis not present

## 2019-02-05 DIAGNOSIS — Z8546 Personal history of malignant neoplasm of prostate: Secondary | ICD-10-CM

## 2019-02-05 DIAGNOSIS — J449 Chronic obstructive pulmonary disease, unspecified: Secondary | ICD-10-CM

## 2019-02-05 DIAGNOSIS — Z720 Tobacco use: Secondary | ICD-10-CM | POA: Diagnosis not present

## 2019-02-05 DIAGNOSIS — R7303 Prediabetes: Secondary | ICD-10-CM

## 2019-02-05 DIAGNOSIS — Z1211 Encounter for screening for malignant neoplasm of colon: Secondary | ICD-10-CM

## 2019-02-05 LAB — POCT GLYCOSYLATED HEMOGLOBIN (HGB A1C): Hemoglobin A1C: 5.6 % (ref 4.0–5.6)

## 2019-02-05 MED ORDER — BREO ELLIPTA 100-25 MCG/INH IN AEPB: INHALATION_SPRAY | RESPIRATORY_TRACT | 5 refills | Status: DC

## 2019-02-05 NOTE — Patient Instructions (Signed)

## 2019-02-05 NOTE — Progress Notes (Signed)
Established Patient Office Visit  Subjective:  Patient ID: Robert [name], male    DOB: [birth year]  Age: 64 y.o. MRN: [id number]  CC:  Chief Complaint  Patient presents with  . Follow-up    HPI Robert [name] , 64 year old male who is seen in follow-up of visit from 09/11/2018 in follow-up of prediabetes.  Patient had hemoglobin A1c at last visit of 5.7.  Hemoglobin in February 2020 was elevated at 5.9.  Other labs done at the time of his last visit include comprehensive metabolic panel which was normal and normal lipid panel.  He additionally has hypertension for which he is currently on benazepril/hydrochlorothiazide, COPD, history of prostate cancer status post prostatectomy and hyperlipidemia.  Overall he feels well.  He reports compliance with his medications.  He does require refill of Breo for treatment of his COPD.  He reports that he does continue to smoke.  He states that nicotine patches did not help at all with his past efforts to stop smoking.  He is currently on Wellbutrin which he has been on for several years and he does not really feel that this decreases his desire to smoke.         He is taking his blood pressure medicine and denies headaches or dizziness related to his blood pressure.  No issues with peripheral edema.  He denies any increased muscle or joint pain with the use of atorvastatin.  He continues to take a daily aspirin and has had no unusual bruising or bleeding and no abdominal pain.  He has made dietary changes and he denies any increased thirst, no urinary frequency, no blurred vision and no numbness in the hands or feet related to his diabetes.  He reports a history of prostate cancer for which he had removal of the prostate.  He currently has to get up approximately once per night to urinate.  He does not believe that he has had recent follow-up with urology.  He also had past colonoscopy with removal of colon polyps  (in 2009 review of chart) but did not have  subsequent repeat colonoscopy.  He denies any blood in the stool and no black stools.  No chest pain or palpitations, no shortness of breath.  He does have occasional mild cough.    Past Medical History:  Diagnosis Date  . 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 Elevated hemoglobin A1c   . Elevated PSA 03/16/2016  . Hyperlipidemia   . Hypertension   . Personal history of colonic adenomas 12/28/2007  . Pre-diabetes   . Prostate cancer ([place]) 03/16/2016  . Vitamin D deficiency     Past Surgical History:  Procedure Laterality Date  . COLONOSCOPY    . LYMPHADENECTOMY N/A 06/28/2016   Procedure: LYMPHADENECTOMY;  Surgeon: Raynelle Bring, MD;  Location: [facility];  Service: Urology;  Laterality: N/A;  . PROSTATE BIOPSY  03/16/2016   Surgical Pathology Gross and Microscopic Examination for Prostate Needle  . ROBOT ASSISTED LAPAROSCOPIC RADICAL PROSTATECTOMY N/A 06/28/2016   Procedure: XI ROBOTIC ASSISTED LAPAROSCOPIC RADICAL PROSTATECTOMY LEVEL 2 EXCISION OF PENILE LESION;  Surgeon: Raynelle Bring, MD;  Location: [facility];  Service: Urology;  Laterality: N/A;    Family History  Problem Relation Age of Onset  . Cancer Mother        lung  . Lung disease Neg Hx     Social History   Socioeconomic History  . Marital status: Married    Spouse name: Not on file  . Number  of children: Not on file  . Years of education: Not on file  . Highest education level: Not on file  Occupational History  . Not on file  Tobacco Use  . Smoking status: Current Every Day Smoker    Packs/day: 0.25    Years: 40.00    Pack years: 10.00    Types: Cigarettes  . Smokeless tobacco: Never Used  . Tobacco comment: has decreased smoking significantly with goal of quitting  Substance and Sexual Activity  . Alcohol use: Yes    Alcohol/week: 4.0 standard drinks    Types: 4 Cans of beer per week    Comment: occasionally  . Drug use: No  . Sexual activity: Yes  Other Topics Concern  . Not on file  Social  History Narrative   [facility] (06/29/16):   Currently works as a Furniture conservator/restorer. Exposure primarily to aluminum and steel. No history of bird or mold exposure. No recent hot tub exposure. Does have 2 dogs.   Social Determinants of Health   Financial Resource Strain:   . Difficulty of Paying Living Expenses: Not on file  Food Insecurity:   . Worried About Charity fundraiser in the Last Year: Not on file  . Ran Out of Food in the Last Year: Not on file  Transportation Needs:   . Lack of Transportation (Medical): Not on file  . Lack of Transportation (Non-Medical): Not on file  Physical Activity:   . Days of Exercise per Week: Not on file  . Minutes of Exercise per Session: Not on file  Stress:   . Feeling of Stress : Not on file  Social Connections:   . Frequency of Communication with Friends and Family: Not on file  . Frequency of Social Gatherings with Friends and Family: Not on file  . Attends Religious Services: Not on file  . Active Member of Clubs or Organizations: Not on file  . Attends Archivist Meetings: Not on file  . Marital Status: Not on file  Intimate Partner Violence:   . Fear of Current or Ex-Partner: Not on file  . Emotionally Abused: Not on file  . Physically Abused: Not on file  . Sexually Abused: Not on file    Outpatient Medications Prior to Visit  Medication Sig Dispense Refill  . albuterol (PROAIR HFA) 108 (90 Base) MCG/ACT inhaler Inhale 2 puffs into the lungs every 6 (six) hours as needed for wheezing or shortness of breath. 18 g 5  . albuterol (PROVENTIL) (2.5 MG/3ML) 0.083% nebulizer solution Take 3 mLs (2.5 mg total) by nebulization every 6 (six) hours as needed for wheezing or shortness of breath. 75 mL 12  . aspirin EC 81 MG tablet Take 81 mg by mouth daily.    Marland Kitchen atorvastatin (LIPITOR) 20 MG tablet Take 1 tablet (20 mg total) by mouth daily. To lower cholesterol 90 tablet 1  . benazepril-hydrochlorthiazide (LOTENSIN HCT) 20-25 MG tablet  Take 1 tablet by mouth daily. 90 tablet 1  . buPROPion (WELLBUTRIN XL) 300 MG 24 hr tablet Take 1 tablet (300 mg total) by mouth daily. 30 tablet 4  . fluticasone furoate-vilanterol (BREO ELLIPTA) 100-25 MCG/INH AEPB INHALE ONE DOSE BY MOUTH INTO THE LUNGS DAILY 60 each 5  . Magnesium 500 MG TABS Take 500 mg by mouth daily.    Marland Kitchen doxycycline (VIBRAMYCIN) 100 MG capsule Take 1 capsule (100 mg total) by mouth 2 (two) times daily. 20 capsule 0   No facility-administered medications prior to visit.  Allergies  Allergen Reactions  . Codeine Other (See Comments)    REACTION: Itching  . Isovue [Iopamidol] Hives    Pt. Developed 1 hive after injection; Dr. Kris Hartmann spoke with patient;recommends 13 hr prep in the future    ROS Review of Systems  Constitutional: Negative for chills and fever.  HENT: Negative for sore throat and trouble swallowing.   Eyes: Negative for photophobia and visual disturbance.  Respiratory: Negative for cough and shortness of breath.   Cardiovascular: Negative for chest pain, palpitations and leg swelling.  Gastrointestinal: Negative for abdominal pain, blood in stool, constipation, diarrhea and nausea.  Endocrine: Negative for polydipsia, polyphagia and polyuria.  Genitourinary: Negative for dysuria and frequency.  Musculoskeletal: Negative for arthralgias and back pain.  Skin: Negative for rash and wound.  Neurological: Negative for dizziness, numbness and headaches.  Hematological: Negative for adenopathy. Does not bruise/bleed easily.  Psychiatric/Behavioral: Negative for self-injury.      Objective:    Physical Exam  Constitutional: He is oriented to person, place, and time. He appears well-developed and well-nourished.  Well-nourished well-developed obese older male in no acute distress.  Patient is wearing mask as per office [id number] policy.  Patient also wears glasses.  Neck: No JVD present. No thyromegaly present.  Cardiovascular: Normal rate and  regular rhythm.  Heart sounds are somewhat distant which may be related to body habitus; no carotid bruits appreciated on exam  Pulmonary/Chest: Effort normal and breath sounds normal.  Abdominal: Soft. There is no abdominal tenderness. There is no rebound and no guarding.  Musculoskeletal:        General: No tenderness or edema.     Cervical back: Normal range of motion.     Comments: No CVA tenderness  Lymphadenopathy:    He has no cervical adenopathy.  Neurological: He is alert and oriented to person, place, and time.  Psychiatric: He has a normal mood and affect. His behavior is normal.  Nursing note and vitals reviewed.   BP 118/74 (BP Location: Right Arm, Patient Position: Sitting, Cuff Size: Normal)   Pulse 73   Temp 98 F (36.7 C) (Oral)   Resp 18   Ht 5\' 5"  (1.651 m)   Wt 252 lb (114.3 kg)   SpO2 94%   BMI 41.93 kg/m  Wt Readings from Last 3 Encounters:  02/05/19 252 lb (114.3 kg)  09/11/18 241 lb (109.3 kg)  02/20/18 233 lb (105.7 kg)     Health Maintenance Due  Topic Date Due  . COLONOSCOPY  03/04/2018      Lab Results  Component Value Date   TSH 1.210 02/20/2018   Lab Results  Component Value Date   WBC 10.4 01/30/2018   HGB 16.2 01/30/2018   HCT 48.0 01/30/2018   MCV 99.0 01/30/2018   PLT 158 01/30/2018   Lab Results  Component Value Date   NA 137 11/06/2018   K 4.2 11/06/2018   CO2 27 11/06/2018   GLUCOSE 102 (H) 11/06/2018   BUN 16 11/06/2018   CREATININE 0.71 (L) 11/06/2018   BILITOT 0.2 11/06/2018   ALKPHOS 85 11/06/2018   AST 16 11/06/2018   ALT 19 11/06/2018   PROT 6.4 11/06/2018   ALBUMIN 4.2 11/06/2018   CALCIUM 9.5 11/06/2018   ANIONGAP 9 01/30/2018   Lab Results  Component Value Date   CHOL 146 11/06/2018   Lab Results  Component Value Date   HDL 60 11/06/2018   Lab Results  Component Value Date   LDLCALC 68  11/06/2018   Lab Results  Component Value Date   TRIG 99 11/06/2018   Lab Results  Component Value Date    CHOLHDL 2.4 11/06/2018   Lab Results  Component Value Date   HGBA1C 5.7 (H) 09/11/2018      Assessment & Plan:  1. Screening for colon cancer Discussed with the patient that our review of chart, he had colonoscopy in 2009 with removal of 3 adenomatous colon polyps and that 5-year colonoscopy was recommended as these polyps can become cancerous.  Patient agreed to be referred back to gastroenterology.  Patient per chart saw Dr. Carlean Purl in the past. - Ambulatory referral to Gastroenterology  2. Chronic obstructive pulmonary disease, unspecified COPD type ([place]); 6. Tobacco dependence He feels that his COPD is stable on his current Breo.  New prescription provided.  Discussed the need for smoking cessation including options for smoking cessation but patient reports that he is currently on Wellbutrin which has not greatly helped, he has been reluctant to take Chantix.  He also did not feel that nicotine patches were of any benefit.  He was offered referral to the clinical pharmacist for further discussion treatment options for smoking cessation which patient will consider but declines at this time.  Approximately 5 minutes spent on smoking cessation discussion/counseling.  - fluticasone furoate-vilanterol (BREO ELLIPTA) 100-25 MCG/INH AEPB; INHALE ONE DOSE BY MOUTH INTO THE LUNGS DAILY  Dispense: 60 each; Refill: 5  3. Prediabetes Patient with hemoglobin A1c on 09/11/2018 of 5.7 consistent with prediabetes.  Hemoglobin A1c obtained at today's visit and was normal at 5.6 which was discussed with the patient.  He should still continue to follow a low carbohydrate diet and regular exercise with goal of weight loss to help reduce risk of becoming diabetic/reduction in insulin resistance. - HgB A1c  4. Essential hypertension Blood pressure is currently well controlled on benazepril hydrochlorothiazide which patient will continue along with low-sodium diet and regular cardiovascular exercise such as  walking encouraged.  5. History of prostate cancer Patient with history of prostate cancer and reports no recent follow-up with urology therefore referral placed and per patient he was seen in the past by Dr. Alinda Money at [facility]. - Ambulatory referral to Urology   An After Visit Summary was printed and given to the patient.  Follow-up: Return in about 5 months (around 07/06/2019) for chronic issues and sooner if needed.   Antony Blackbird, MD

## 2019-02-19 DIAGNOSIS — Z20822 Contact with and (suspected) exposure to covid-19: Secondary | ICD-10-CM | POA: Diagnosis not present

## 2019-02-23 ENCOUNTER — Other Ambulatory Visit: Payer: Self-pay | Admitting: Family Medicine

## 2019-02-23 DIAGNOSIS — E782 Mixed hyperlipidemia: Secondary | ICD-10-CM

## 2019-03-02 ENCOUNTER — Other Ambulatory Visit: Payer: Self-pay | Admitting: Family Medicine

## 2019-03-02 DIAGNOSIS — I10 Essential (primary) hypertension: Secondary | ICD-10-CM

## 2019-04-02 ENCOUNTER — Encounter: Payer: Self-pay | Admitting: Internal Medicine

## 2019-04-09 ENCOUNTER — Other Ambulatory Visit: Payer: Self-pay

## 2019-04-09 ENCOUNTER — Ambulatory Visit (AMBULATORY_SURGERY_CENTER): Payer: Self-pay | Admitting: *Deleted

## 2019-04-09 VITALS — Temp 97.4°F | Ht 65.0 in | Wt 240.4 lb

## 2019-04-09 DIAGNOSIS — Z86010 Personal history of colonic polyps: Secondary | ICD-10-CM

## 2019-04-09 NOTE — Progress Notes (Signed)
Patient denies any allergies to egg or soy products. Patient denies complications with anesthesia/sedation.  Patient denies oxygen use at home and denies diet medications. Emmi instructions for colonoscopy/endoscopy explained and given to patient.   Patient had covid vaccinations, last one on 03/27/19.

## 2019-04-19 ENCOUNTER — Encounter: Payer: Self-pay | Admitting: Internal Medicine

## 2019-04-23 ENCOUNTER — Other Ambulatory Visit: Payer: Self-pay

## 2019-04-23 ENCOUNTER — Encounter: Payer: Self-pay | Admitting: Internal Medicine

## 2019-04-23 ENCOUNTER — Ambulatory Visit (AMBULATORY_SURGERY_CENTER): Payer: BC Managed Care – PPO | Admitting: Internal Medicine

## 2019-04-23 VITALS — BP 144/77 | HR 66 | Temp 97.7°F | Resp 23 | Ht 65.0 in | Wt 240.0 lb

## 2019-04-23 DIAGNOSIS — D120 Benign neoplasm of cecum: Secondary | ICD-10-CM | POA: Diagnosis not present

## 2019-04-23 DIAGNOSIS — Z86010 Personal history of colonic polyps: Secondary | ICD-10-CM | POA: Diagnosis not present

## 2019-04-23 DIAGNOSIS — D123 Benign neoplasm of transverse colon: Secondary | ICD-10-CM

## 2019-04-23 DIAGNOSIS — D128 Benign neoplasm of rectum: Secondary | ICD-10-CM | POA: Diagnosis not present

## 2019-04-23 DIAGNOSIS — D127 Benign neoplasm of rectosigmoid junction: Secondary | ICD-10-CM | POA: Diagnosis not present

## 2019-04-23 DIAGNOSIS — Z1211 Encounter for screening for malignant neoplasm of colon: Secondary | ICD-10-CM | POA: Diagnosis not present

## 2019-04-23 DIAGNOSIS — D125 Benign neoplasm of sigmoid colon: Secondary | ICD-10-CM | POA: Diagnosis not present

## 2019-04-23 DIAGNOSIS — D122 Benign neoplasm of ascending colon: Secondary | ICD-10-CM

## 2019-04-23 MED ORDER — SODIUM CHLORIDE 0.9 % IV SOLN: 500.0000 mL | Freq: Once | INTRAVENOUS | Status: DC

## 2019-04-23 NOTE — Op Note (Signed)
[facility] Patient Name: Robert [name] Procedure Date: 04/23/2019 2:41 PM MRN: [id number] Endoscopist: Gatha Mayer , MD Age: 64 Referring MD:  Date of Birth: 07-15-1955 Gender: Male Account #: [id number] Procedure:                Colonoscopy Indications:              Surveillance: Personal history of adenomatous                            polyps on last colonoscopy > 5 years ago, Last                            colonoscopy: 2009 Procedure:                Pre-Anesthesia Assessment:                           - Prior to the procedure, a History and Physical                            was performed, and patient medications and                            allergies were reviewed. The patient's tolerance of                            previous anesthesia was also reviewed. The risks                            and benefits of the procedure and the sedation                            options and risks were discussed with the patient.                            All questions were answered, and informed consent                            was obtained. Prior Anticoagulants: The patient has                            taken no previous anticoagulant or antiplatelet                            agents. ASA Grade Assessment: III - A patient with                            severe systemic disease. After reviewing the risks                            and benefits, the patient was deemed in                            satisfactory condition to undergo the procedure.  After obtaining informed consent, the colonoscope                            was passed under direct vision. Throughout the                            procedure, the patient's blood pressure, pulse, and                            oxygen saturations were monitored continuously. The                            Colonoscope was introduced through the anus and                            advanced to the the cecum,  identified by                            appendiceal orifice and ileocecal valve. The                            colonoscopy was performed without difficulty. The                            patient tolerated the procedure well. The quality                            of the bowel preparation was good. The ileocecal                            valve, appendiceal orifice, and rectum were                            photographed. The bowel preparation used was                            Miralax via split dose instruction. Scope In: 3:39:34 PM Scope Out: 4:05:01 PM Scope Withdrawal Time: 0 hours 22 minutes 56 seconds  Total Procedure Duration: 0 hours 25 minutes 27 seconds  Findings:                 The perianal examination was normal.                           The digital rectal exam findings include surgically                            absent prostate.                           Nine sessile polyps were found in the rectum,                            sigmoid colon, transverse colon, ascending colon  and cecum. The polyps were diminutive in size.                            These polyps were removed with a cold snare.                            Resection and retrieval were complete. Verification                            of patient identification for the specimen was                            done. Estimated blood loss was minimal.                           Multiple small and large-mouthed diverticula were                            found in the sigmoid colon, descending colon and                            ascending colon.                           The exam was otherwise without abnormality on                            direct and retroflexion views. Complications:            No immediate complications. Estimated Blood Loss:     Estimated blood loss was minimal. Impression:               - A surgically absent prostate found on digital                             rectal exam.                           - Nine diminutive polyps in the rectum, in the                            sigmoid colon, in the transverse colon, in the                            ascending colon and in the cecum, removed with a                            cold snare. Resected and retrieved.                           - Diverticulosis in the sigmoid colon, in the                            descending colon and in the ascending colon.                           -  The examination was otherwise normal on direct                            and retroflexion views.                           - Personal history of colonic polyps 3 diminutive                            adenomas 2009. Recommendation:           - Patient has a contact number available for                            emergencies. The signs and symptoms of potential                            delayed complications were discussed with the                            patient. Return to normal activities tomorrow.                            Written discharge instructions were provided to the                            patient.                           - Resume previous diet.                           - Continue present medications.                           - Repeat colonoscopy is recommended for                            surveillance. The colonoscopy date will be                            determined after pathology results from today's                            exam become available for review. Gatha Mayer, MD 04/23/2019 4:14:07 PM This report has been signed electronically.

## 2019-04-23 NOTE — Progress Notes (Signed)
Pt's states no medical or surgical changes since previsit or office visit. Temp by ADB. VS by DT.

## 2019-04-23 NOTE — Patient Instructions (Addendum)
I found and removed 9 very small polyps today. All look benign but at least some will be pre-cancerous. You also have a condition called diverticulosis - common and not usually a problem. Please read the handout provided.  I will let you know pathology results and when to have another routine colonoscopy by mail and/or My Chart.  I appreciate the opportunity to care for you. Gatha Mayer, MD, FACG   YOU HAD AN ENDOSCOPIC PROCEDURE TODAY AT [facility]:   Refer to the procedure report that was given to you for any specific questions about what was found during the examination.  If the procedure report does not answer your questions, please call your gastroenterologist to clarify.  If you requested that your care partner not be given the details of your procedure findings, then the procedure report has been included in a sealed envelope for you to review at your convenience later.  YOU SHOULD EXPECT: Some feelings of bloating in the abdomen. Passage of more gas than usual.  Walking can help get rid of the air that was put into your GI tract during the procedure and reduce the bloating. If you had a lower endoscopy (such as a colonoscopy or flexible sigmoidoscopy) you may notice spotting of blood in your stool or on the toilet paper. If you underwent a bowel prep for your procedure, you may not have a normal bowel movement for a few days.  Please Note:  You might notice some irritation and congestion in your nose or some drainage.  This is from the oxygen used during your procedure.  There is no need for concern and it should clear up in a day or so.  SYMPTOMS TO REPORT IMMEDIATELY:   Following lower endoscopy (colonoscopy or flexible sigmoidoscopy):  Excessive amounts of blood in the stool  Significant tenderness or worsening of abdominal pains  Swelling of the abdomen that is new, acute  Fever of 100F or higher   For urgent or emergent issues, a gastroenterologist can be  reached at any hour by calling [phone]. Do not use MyChart messaging for urgent concerns.    DIET:  We do recommend a small meal at first, but then you may proceed to your regular diet.  Drink plenty of fluids but you should avoid alcoholic beverages for 24 hours.  ACTIVITY:  You should plan to take it easy for the rest of today and you should NOT DRIVE or use heavy machinery until tomorrow (because of the sedation medicines used during the test).    FOLLOW UP: Our staff will call the number listed on your records 48-72 hours following your procedure to check on you and address any questions or concerns that you may have regarding the information given to you following your procedure. If we do not reach you, we will leave a message.  We will attempt to reach you two times.  During this call, we will ask if you have developed any symptoms of COVID 19. If you develop any symptoms (ie: fever, flu-like symptoms, shortness of breath, cough etc.) before then, please call [phone].  If you test positive for Covid 19 in the 2 weeks post procedure, please call and report this information to [place].    If any biopsies were taken you will be contacted by phone or by letter within the next 1-3 weeks.  Please call us at [phone] if you have not heard about the biopsies in 3 weeks.    SIGNATURES/CONFIDENTIALITY:  You and/or your care partner have signed paperwork which will be entered into your electronic medical record.  These signatures attest to the fact that that the information above on your After Visit Summary has been reviewed and is understood.  Full responsibility of the confidentiality of this discharge information lies with you and/or your care-partner.

## 2019-04-23 NOTE — Progress Notes (Signed)
Report given to PACU, vss 

## 2019-04-23 NOTE — Progress Notes (Signed)
Called to room to assist during endoscopic procedure.  Patient ID and intended procedure confirmed with present staff. Received instructions for my participation in the procedure from the performing physician.  

## 2019-04-25 ENCOUNTER — Telehealth: Payer: Self-pay | Admitting: *Deleted

## 2019-04-25 NOTE — Telephone Encounter (Signed)
First follow up call attempt.  Message left call if any questions or concerns.

## 2019-04-25 NOTE — Telephone Encounter (Signed)
  Follow up Call-  Call back number 04/23/2019  Post procedure Call Back phone  # [phone]  Permission to leave phone message Yes  Some recent data might be hidden     Patient questions:  Do you have a fever, pain , or abdominal swelling? No. Pain Score  0 *  Have you tolerated food without any problems? Yes.    Have you been able to return to your normal activities? Yes.    Do you have any questions about your discharge instructions: Diet   No. Medications  No. Follow up visit  No.  Do you have questions or concerns about your Care? No.  Actions: * If pain score is 4 or above: No action needed, pain <4.  1. Have you developed a fever since your procedure? no  2.   Have you had an respiratory symptoms (SOB or cough) since your procedure? no  3.   Have you tested positive for COVID 19 since your procedure no  4.   Have you had any family members/close contacts diagnosed with the COVID 19 since your procedure?  no   If yes to any of these questions please route to Joylene John, RN and Erenest Rasher, RN

## 2019-04-30 ENCOUNTER — Encounter: Payer: Self-pay | Admitting: Internal Medicine

## 2019-04-30 DIAGNOSIS — Z86010 Personal history of colonic polyps: Secondary | ICD-10-CM

## 2019-06-01 ENCOUNTER — Other Ambulatory Visit: Payer: Self-pay | Admitting: Family Medicine

## 2019-06-01 DIAGNOSIS — E782 Mixed hyperlipidemia: Secondary | ICD-10-CM

## 2019-06-12 ENCOUNTER — Other Ambulatory Visit: Payer: Self-pay | Admitting: Family Medicine

## 2019-06-12 DIAGNOSIS — J449 Chronic obstructive pulmonary disease, unspecified: Secondary | ICD-10-CM

## 2019-07-09 ENCOUNTER — Telehealth (INDEPENDENT_AMBULATORY_CARE_PROVIDER_SITE_OTHER): Payer: BC Managed Care – PPO | Admitting: Internal Medicine

## 2019-07-09 ENCOUNTER — Encounter: Payer: Self-pay | Admitting: Internal Medicine

## 2019-07-09 DIAGNOSIS — E782 Mixed hyperlipidemia: Secondary | ICD-10-CM | POA: Diagnosis not present

## 2019-07-09 DIAGNOSIS — B070 Plantar wart: Secondary | ICD-10-CM | POA: Diagnosis not present

## 2019-07-09 DIAGNOSIS — I10 Essential (primary) hypertension: Secondary | ICD-10-CM

## 2019-07-09 DIAGNOSIS — R7303 Prediabetes: Secondary | ICD-10-CM | POA: Diagnosis not present

## 2019-07-09 NOTE — Progress Notes (Signed)
Virtual Visit via Telephone Note  I connected with Robert [name], on 07/09/2019 at 2:55 PM by telephone due to the COVID-19 pandemic and verified that I am speaking with the correct person using two identifiers.   Consent: I discussed the limitations, risks, security and privacy concerns of performing an evaluation and management service by telephone and the availability of in person appointments. I also discussed with the patient that there may be a patient responsible charge related to this service. The patient expressed understanding and agreed to proceed.   Location of Patient: Home   Location of Provider: Clinic    Persons participating in Telemedicine visit: Sourish Allender [facility] Dr. Juleen China      History of Present Illness: Patient has a visit to follow up on chronic medical conditions.   Chronic HTN Disease Monitoring:  Home BP Monitoring - Not monitoring because was always good in past per patient.  Chest pain- no  Dyspnea- no Headache - no  Medications: Benazepril 20 mg, HCTZ 25 mg  Compliance- yes Lightheadedness- no  Edema- no   Plantar Wart: Present on bottom of left foot. Present for about one month. Painful. Has tried the OTC wart removal pads. Has had one in past that was removed.   HYPERLIPIDEMIA  Symptoms Chest pain on exertion:  no    Leg claudication:   no  Medication Monitoring Compliance- yes   Right upper quadrant pain- no   Muscle aches- no    Past Medical History:  Diagnosis Date  . Arthritis    hands/knees  . 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 COPD (chronic obstructive pulmonary disease) ([place])   . Elevated hemoglobin A1c   . Elevated PSA 03/16/2016  . Hyperlipidemia   . Hypertension   . Personal history of colonic adenomas 12/28/2007  . Pre-diabetes   . Prostate cancer ([place]) 03/16/2016  . Vitamin D deficiency    Allergies  Allergen Reactions  . Codeine Other (See Comments)    REACTION: Itching   . Isovue [Iopamidol] Hives    Pt. Developed 1 hive after injection; Dr. Kris Hartmann spoke with patient;recommends 13 hr prep in the future    Current Outpatient Medications on File Prior to Visit  Medication Sig Dispense Refill  . albuterol (VENTOLIN HFA) 108 (90 Base) MCG/ACT inhaler INHALE 2 PUFFS BY MOUTH INTO THE LUNGS EVERY 6 HOURS AS NEEDED FOR WHEEZING OR SHORTNESS OF BREATH 8.5 g 3  . aspirin EC 81 MG tablet Take 81 mg by mouth daily.    Marland Kitchen atorvastatin (LIPITOR) 20 MG tablet TAKE ONE TABLET BY MOUTH DAILY TO LOWER CHOLESTEROL 90 tablet 0  . benazepril-hydrochlorthiazide (LOTENSIN HCT) 20-25 MG tablet TAKE ONE TABLET BY MOUTH DAILY 90 tablet 1  . fluticasone furoate-vilanterol (BREO ELLIPTA) 100-25 MCG/INH AEPB INHALE ONE DOSE BY MOUTH INTO THE LUNGS DAILY 60 each 5  . Magnesium 500 MG TABS Take 500 mg by mouth daily.     No current facility-administered medications on file prior to visit.    Observations/Objective: NAD. Speaking clearly.  Work of breathing normal.  Alert and oriented. Mood appropriate.   Assessment and Plan: 1. Prediabetes A1c was 5.6% in January and has been very well controlled for several years. Not on any medications. Discussed we can likely due less frequency of screening and check A1c with his annual labs in October.   2. Essential hypertension BP has been well controlled in the past. Asymptomatic. Continue with current regimen.  Counseled on blood pressure goal of  less than 130/80, low-sodium, DASH diet, medication compliance, 150 minutes of moderate intensity exercise per week. Discussed medication compliance, adverse effects.  3. Mixed hyperlipidemia LDL at goal with result of 68 in Oct 2020. Continue statin therapy.   4. Plantar wart of left foot Appointment scheduled for outpatient visit with surgeon at [facility] for evaluation and potential removal on 7/13.    Follow Up Instructions: Gen Surg visit; f/u for annual exam Oct 2021    I discussed the  assessment and treatment plan with the patient. The patient was provided an opportunity to ask questions and all were answered. The patient agreed with the plan and demonstrated an understanding of the instructions.   The patient was advised to call back or seek an in-person evaluation if the symptoms worsen or if the condition fails to improve as anticipated.     I provided 14 minutes total of non-face-to-face time during this encounter including median intraservice time, reviewing previous notes, investigations, ordering medications, medical decision making, coordinating care and patient verbalized understanding at the end of the visit.    Phill Myron, D.O. Primary Care at [facility]  07/09/2019, 2:55 PM

## 2019-07-24 ENCOUNTER — Ambulatory Visit: Payer: BC Managed Care – PPO | Admitting: General Surgery

## 2019-08-02 ENCOUNTER — Ambulatory Visit: Payer: BC Managed Care – PPO | Attending: General Surgery | Admitting: General Surgery

## 2019-08-02 ENCOUNTER — Ambulatory Visit: Payer: BC Managed Care – PPO | Admitting: General Surgery

## 2019-08-02 ENCOUNTER — Encounter: Payer: Self-pay | Admitting: General Surgery

## 2019-08-02 ENCOUNTER — Other Ambulatory Visit: Payer: Self-pay

## 2019-08-02 VITALS — Ht 66.0 in | Wt 247.0 lb

## 2019-08-02 DIAGNOSIS — B079 Viral wart, unspecified: Secondary | ICD-10-CM | POA: Diagnosis not present

## 2019-08-02 DIAGNOSIS — B070 Plantar wart: Secondary | ICD-10-CM | POA: Diagnosis not present

## 2019-08-02 NOTE — Progress Notes (Signed)
Here for consult wart removal L foot

## 2019-08-02 NOTE — Progress Notes (Signed)
Acute Office Visit  Subjective:    Patient ID: Robert [name], male    DOB: 07-14-1955, 64 y.o.   MRN: [id number]  No chief complaint on file.   HPI Patient is in today for plantar wart on the left foot.  Not diabetic.  Has been removed before.  Past Medical History:  Diagnosis Date  . Arthritis    hands/knees  . 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 COPD (chronic obstructive pulmonary disease) ([place])   . Elevated hemoglobin A1c   . Elevated PSA 03/16/2016  . Hyperlipidemia   . Hypertension   . Personal history of colonic adenomas 12/28/2007  . Pre-diabetes   . Prostate cancer ([place]) 03/16/2016  . Vitamin D deficiency     Past Surgical History:  Procedure Laterality Date  . COLONOSCOPY  2009   CG  hems, TAs  . LYMPHADENECTOMY N/A 06/28/2016   Procedure: LYMPHADENECTOMY;  Surgeon: Raynelle Bring, MD;  Location: [facility];  Service: Urology;  Laterality: N/A;  . PROSTATE BIOPSY  03/16/2016   Surgical Pathology Gross and Microscopic Examination for Prostate Needle  . ROBOT ASSISTED LAPAROSCOPIC RADICAL PROSTATECTOMY N/A 06/28/2016   Procedure: XI ROBOTIC ASSISTED LAPAROSCOPIC RADICAL PROSTATECTOMY LEVEL 2 EXCISION OF PENILE LESION;  Surgeon: Raynelle Bring, MD;  Location: [facility];  Service: Urology;  Laterality: N/A;    Family History  Problem Relation Age of Onset  . Cancer Mother        lung  . Lung disease Neg Hx   . Colon cancer Neg Hx   . Rectal cancer Neg Hx   . Stomach cancer Neg Hx     Social History   Socioeconomic History  . Marital status: Married    Spouse name: Not on file  . Number of children: Not on file  . Years of education: Not on file  . Highest education level: Not on file  Occupational History  . Not on file  Tobacco Use  . Smoking status: Current Every Day Smoker    Packs/day: 0.50    Years: 40.00    Pack years: 20.00    Types: Cigarettes  . Smokeless tobacco: Never Used  . Tobacco comment: has decreased smoking significantly with goal  of quitting  Vaping Use  . Vaping Use: Never used  Substance and Sexual Activity  . Alcohol use: Yes    Alcohol/week: 4.0 standard drinks    Types: 4 Cans of beer per week    Comment: occasionally  . Drug use: No  . Sexual activity: Yes  Other Topics Concern  . Not on file  Social History Narrative   [facility] (06/29/16):   Currently works as a Furniture conservator/restorer. Exposure primarily to aluminum and steel. No history of bird or mold exposure. No recent hot tub exposure. Does have 2 dogs.   Social Determinants of Health   Financial Resource Strain:   . Difficulty of Paying Living Expenses:   Food Insecurity:   . Worried About Charity fundraiser in the Last Year:   . Arboriculturist in the Last Year:   Transportation Needs:   . Film/video editor (Medical):   Marland Kitchen Lack of Transportation (Non-Medical):   Physical Activity:   . Days of Exercise per Week:   . Minutes of Exercise per Session:   Stress:   . Feeling of Stress :   Social Connections:   . Frequency of Communication with Friends and Family:   . Frequency of Social Gatherings with Friends and  Family:   . Attends Religious Services:   . Active Member of Clubs or Organizations:   . Attends Archivist Meetings:   Marland Kitchen Marital Status:   Intimate Partner Violence:   . Fear of Current or Ex-Partner:   . Emotionally Abused:   Marland Kitchen Physically Abused:   . Sexually Abused:     Outpatient Medications Prior to Visit  Medication Sig Dispense Refill  . albuterol (VENTOLIN HFA) 108 (90 Base) MCG/ACT inhaler INHALE 2 PUFFS BY MOUTH INTO THE LUNGS EVERY 6 HOURS AS NEEDED FOR WHEEZING OR SHORTNESS OF BREATH 8.5 g 3  . aspirin EC 81 MG tablet Take 81 mg by mouth daily.    Marland Kitchen atorvastatin (LIPITOR) 20 MG tablet TAKE ONE TABLET BY MOUTH DAILY TO LOWER CHOLESTEROL 90 tablet 0  . benazepril-hydrochlorthiazide (LOTENSIN HCT) 20-25 MG tablet TAKE ONE TABLET BY MOUTH DAILY 90 tablet 1  . fluticasone furoate-vilanterol (BREO ELLIPTA)  100-25 MCG/INH AEPB INHALE ONE DOSE BY MOUTH INTO THE LUNGS DAILY 60 each 5  . Magnesium 500 MG TABS Take 500 mg by mouth daily.     No facility-administered medications prior to visit.    Allergies  Allergen Reactions  . Codeine Other (See Comments)    REACTION: Itching  . Isovue [Iopamidol] Hives    Pt. Developed 1 hive after injection; Dr. Kris Hartmann spoke with patient;recommends 13 hr prep in the future    Review of Systems  All other systems reviewed and are negative.      Objective:    Physical Exam Musculoskeletal:       Feet:         Ht 5\' 6"  (1.676 m)   Wt (!) 247 lb (112 kg)   BMI 39.87 kg/m  Wt Readings from Last 3 Encounters:  08/02/19 (!) 247 lb (112 kg)  04/23/19 240 lb (108.9 kg)  04/09/19 240 lb 6.4 oz (109 kg)    Health Maintenance Due  Topic Date Due  . COVID-19 Vaccine (1) Never done    There are no preventive care reminders to display for this patient.   Lab Results  Component Value Date   TSH 1.210 02/20/2018   Lab Results  Component Value Date   WBC 10.4 01/30/2018   HGB 16.2 01/30/2018   HCT 48.0 01/30/2018   MCV 99.0 01/30/2018   PLT 158 01/30/2018   Lab Results  Component Value Date   NA 137 11/06/2018   K 4.2 11/06/2018   CO2 27 11/06/2018   GLUCOSE 102 (H) 11/06/2018   BUN 16 11/06/2018   CREATININE 0.71 (L) 11/06/2018   BILITOT 0.2 11/06/2018   ALKPHOS 85 11/06/2018   AST 16 11/06/2018   ALT 19 11/06/2018   PROT 6.4 11/06/2018   ALBUMIN 4.2 11/06/2018   CALCIUM 9.5 11/06/2018   ANIONGAP 9 01/30/2018   Lab Results  Component Value Date   CHOL 146 11/06/2018   Lab Results  Component Value Date   HDL 60 11/06/2018   Lab Results  Component Value Date   LDLCALC 68 11/06/2018   Lab Results  Component Value Date   TRIG 99 11/06/2018   Lab Results  Component Value Date   CHOLHDL 2.4 11/06/2018   Lab Results  Component Value Date   HGBA1C 5.6 02/05/2019       Assessment & Plan:   1.0 cm left foot  plantar wart  Excised with 1.0% xylocaine with epi, betadine prep.  3 cm excisional site, closed with 3-0 Nylon.  Sterile dressing applied. Problem List Items Addressed This Visit    None       No orders of the defined types were placed in this encounter.    Judeth Horn, MD

## 2019-08-02 NOTE — Patient Instructions (Signed)
Stay off foot for next two days.  Leave dressing intact x 2 days.  RTC 2 weeks.  Will keep off work x 48 hours.

## 2019-08-06 LAB — ANATOMIC PATHOLOGY REPORT

## 2019-08-12 NOTE — Progress Notes (Signed)
History of Present Illness:       This very nice 64 y.o.  WWM presents  To re-establish care after a 1&1/2 year absence and who had been followed with HTN, HLD, Pre-Diabetes, hx/o Ca Prostate, COPD and Vitamin D Deficiency.   In  June 2018, patient underwent  Robotic Prostatectomy for Prostate Cancer  by Dr Dutch Gray. Patient's COPD is stable on Breo.      Patient is treated for HTN since 2004 & BP has been controlled at home. Today's BP is at goal - 122/72. Patient has had no complaints of any cardiac type chest pain, palpitations, dyspnea / orthopnea / PND, dizziness, claudication, or dependent edema.      Hyperlipidemia is controlled with diet & Atorvastatin. Patient denies myalgias or other med SE's. Last Lipids were at goal:  Lab Results  Component Value Date   CHOL 146 11/06/2018   HDL 60 11/06/2018   LDLCALC 68 11/06/2018   TRIG 99 11/06/2018   CHOLHDL 2.4 11/06/2018    Also, the patient has history of  Morbid Obesity (BMI 39.87) and consequent  PreDiabetes (A1c 5.7% / 2009 & 5.8% / 2012)  and has had no symptoms of reactive hypoglycemia, diabetic polys, paresthesias or visual blurring.  Last A1c was Normal & at goal:  Lab Results  Component Value Date   HGBA1C 5.6 02/05/2019           Further, the patient also has history of Vitamin D Deficiency ("21" / 2008)  and supplements vitamin D without any suspected side-effects. Last vitamin D was still slightly low:  Lab Results  Component Value Date   VD25OH 47.7 02/20/2018    Current Outpatient Medications on File Prior to Visit  Medication Sig  . albuterol (VENTOLIN HFA) 108 (90 Base) MCG/ACT inhaler INHALE 2 PUFFS BY MOUTH INTO THE LUNGS EVERY 6 HOURS AS NEEDED FOR WHEEZING OR SHORTNESS OF BREATH  . aspirin EC 81 MG tablet Take 81 mg by mouth daily.  Marland Kitchen atorvastatin (LIPITOR) 20 MG tablet TAKE ONE TABLET BY MOUTH DAILY TO LOWER CHOLESTEROL  . benazepril-hydrochlorthiazide (LOTENSIN HCT) 20-25 MG tablet TAKE ONE  TABLET BY MOUTH DAILY  . fluticasone furoate-vilanterol (BREO ELLIPTA) 100-25 MCG/INH AEPB INHALE ONE DOSE BY MOUTH INTO THE LUNGS DAILY  . Magnesium 500 MG TABS Take 500 mg by mouth daily.   No current facility-administered medications on file prior to visit.    Allergies  Allergen Reactions  . Codeine Other (See Comments)    REACTION: Itching  . Isovue [Iopamidol] Hives    Pt. Developed 1 hive after injection; Dr. Kris Hartmann spoke with patient;recommends 13 hr prep in the future    PMHx:   Past Medical History:  Diagnosis Date  . Arthritis    hands/knees  . 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 COPD (chronic obstructive pulmonary disease) ([place])   . Elevated hemoglobin A1c   . Elevated PSA 03/16/2016  . Hyperlipidemia   . Hypertension   . Personal history of colonic adenomas 12/28/2007  . Pre-diabetes   . Prostate cancer ([place]) 03/16/2016  . Vitamin D deficiency     Immunization History  Administered Date(s) Administered  . Influenza,inj,Quad PF,6+ Mos 12/27/2017, 12/15/2018  . Janssen (J&J) SARS-COV-2 Vaccination 03/27/2019  . Pneumococcal Polysaccharide-23 01/11/1997, 12/27/2017  . Td 01/12/2004  . Tdap 07/09/2016  . Zoster 01/12/2008    Past Surgical History:  Procedure Laterality Date  . COLONOSCOPY  2009   CG  hems, TAs  .  LYMPHADENECTOMY N/A 06/28/2016   Procedure: LYMPHADENECTOMY;  Surgeon: Raynelle Bring, MD;  Location: [facility];  Service: Urology;  Laterality: N/A;  . PROSTATE BIOPSY  03/16/2016   Surgical Pathology Gross and Microscopic Examination for Prostate Needle  . ROBOT ASSISTED LAPAROSCOPIC RADICAL PROSTATECTOMY N/A 06/28/2016   Procedure: XI ROBOTIC ASSISTED LAPAROSCOPIC RADICAL PROSTATECTOMY LEVEL 2 EXCISION OF PENILE LESION;  Surgeon: Raynelle Bring, MD;  Location: [facility];  Service: Urology;  Laterality: N/A;    FHx:    Reviewed / unchanged  SHx:    Reviewed / unchanged   Systems Review:  Constitutional: Denies fever, chills, wt changes,  headaches, insomnia, fatigue, night sweats, change in appetite. Eyes: Denies redness, blurred vision, diplopia, discharge, itchy, watery eyes.  ENT: Denies discharge, congestion, post nasal drip, epistaxis, sore throat, earache, hearing loss, dental pain, tinnitus, vertigo, sinus pain, snoring.  CV: Denies chest pain, palpitations, irregular heartbeat, syncope, dyspnea, diaphoresis, orthopnea, PND, claudication or edema. Respiratory: denies cough, dyspnea, DOE, pleurisy, hoarseness, laryngitis, wheezing.  Gastrointestinal: Denies dysphagia, odynophagia, heartburn, reflux, water brash, abdominal pain or cramps, nausea, vomiting, bloating, diarrhea, constipation, hematemesis, melena, hematochezia  or hemorrhoids. Genitourinary: Denies dysuria, frequency, urgency, nocturia, hesitancy, discharge, hematuria or flank pain. Musculoskeletal: Denies arthralgias, myalgias, stiffness, jt. swelling, pain, limping or strain/sprain.  Skin: Denies pruritus, rash, hives, warts, acne, eczema or change in skin lesion(s). Neuro: No weakness, tremor, incoordination, spasms, paresthesia or pain. Psychiatric: Denies confusion, memory loss or sensory loss. Endo: Denies change in weight, skin or hair change.  Heme/Lymph: No excessive bleeding, bruising or enlarged lymph nodes.  Physical Exam  BP 122/72   Pulse 72   Temp (!) 96.5 F (35.8 C)   Resp 16   Ht 5\' 6"  (1.676 m)   Wt 246 lb 3.2 oz (111.7 kg)   BMI 39.74 kg/m   Appears  over nourished and in no distress.  Eyes: PERRLA, EOMs, conjunctiva no swelling or erythema. Sinuses: No frontal/maxillary tenderness ENT/Mouth: EAC's clear, TM's nl w/o erythema, bulging. Nares clear w/o erythema, swelling, exudates. Oropharynx clear without erythema or exudates. Oral hygiene is good. Tongue normal, non obstructing. Hearing intact.  Neck: Supple. Thyroid not palpable. Car 2+/2+ without bruits, nodes or JVD. Chest: Respirations nl with BS clear & equal w/o rales,  rhonchi, wheezing or stridor.  Cor: Heart sounds normal w/ regular rate and rhythm without sig. murmurs, gallops, clicks or rubs. Peripheral pulses normal and equal  without edema.  Abdomen: Soft, obese & bowel sounds normal. Non-tender w/o guarding, rebound, hernias, masses or organomegaly.  Lymphatics: Unremarkable.  Musculoskeletal: Full ROM all peripheral extremities, joint stability, 5/5 strength and normal gait.  Skin: Warm, dry without exposed rashes, lesions or ecchymosis apparent.  Neuro: Cranial nerves intact, reflexes equal bilaterally. Sensory-motor testing grossly intact. Tendon reflexes grossly intact.  Pysch: Alert & oriented x 3.  Insight and judgement nl & appropriate. No ideations.  Assessment and Plan:  1. Essential hypertension  - Continue medication, monitor blood pressure at home.  - Continue DASH diet.  Reminder to go to the ER if any CP,  SOB, nausea, dizziness, severe HA, changes vision/speech.  - CBC with Differential/Platelet - COMPLETE METABOLIC PANEL WITH GFR - Magnesium - TSH  2. Hyperlipidemia, mixed  - Continue diet/meds, exercise,& lifestyle modifications.  - Continue monitor periodic cholesterol/liver & renal functions   - Lipid panel - TSH  3. Abnormal glucose  - Continue diet, exercise  - Lifestyle modifications.  - Monitor appropriate labs.  - Hemoglobin A1c - Insulin,  random  4. Vitamin D deficiency  - Continue supplementation.  - VITAMIN D 25 Hydroxy  5. Prediabetes  - Hemoglobin A1c - Insulin, random  6. Chronic bronchitis (HCC)  7. 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 Medication management  - CBC with Differential/Platelet - COMPLETE METABOLIC PANEL WITH GFR - Magnesium - Lipid panel - TSH - Hemoglobin A1c - Insulin, random - VITAMIN D 25 Hydroxy        Discussed  regular exercise, BP monitoring, weight  control to achieve/maintain BMI less than 25 and discussed med and SE's. Recommended labs to assess and monitor clinical status with further disposition pending results of labs.  I discussed the assessment and treatment plan with the patient. The patient was provided an opportunity to ask questions and all were answered. The patient agreed with the plan and demonstrated an understanding of the instructions.  I provided over 30 minutes of exam, counseling, chart review and  complex critical decision making.   Kirtland Bouchard, MD

## 2019-08-13 ENCOUNTER — Other Ambulatory Visit: Payer: Self-pay

## 2019-08-13 ENCOUNTER — Encounter: Payer: Self-pay | Admitting: Internal Medicine

## 2019-08-13 ENCOUNTER — Ambulatory Visit: Payer: BC Managed Care – PPO | Admitting: Internal Medicine

## 2019-08-13 VITALS — BP 122/72 | HR 72 | Temp 96.5°F | Resp 16 | Ht 66.0 in | Wt 246.2 lb

## 2019-08-13 DIAGNOSIS — Z6839 Body mass index (BMI) 39.0-39.9, adult: Secondary | ICD-10-CM

## 2019-08-13 DIAGNOSIS — Z79899 Other long term (current) drug therapy: Secondary | ICD-10-CM | POA: Diagnosis not present

## 2019-08-13 DIAGNOSIS — I10 Essential (primary) hypertension: Secondary | ICD-10-CM

## 2019-08-13 DIAGNOSIS — E782 Mixed hyperlipidemia: Secondary | ICD-10-CM

## 2019-08-13 DIAGNOSIS — R7303 Prediabetes: Secondary | ICD-10-CM | POA: Diagnosis not present

## 2019-08-13 DIAGNOSIS — R7309 Other abnormal glucose: Secondary | ICD-10-CM | POA: Diagnosis not present

## 2019-08-13 DIAGNOSIS — J42 Unspecified chronic bronchitis: Secondary | ICD-10-CM

## 2019-08-13 DIAGNOSIS — E661 Drug-induced obesity: Secondary | ICD-10-CM

## 2019-08-13 DIAGNOSIS — E559 Vitamin D deficiency, unspecified: Secondary | ICD-10-CM | POA: Diagnosis not present

## 2019-08-13 NOTE — Patient Instructions (Signed)

## 2019-08-14 ENCOUNTER — Ambulatory Visit: Payer: BC Managed Care – PPO | Attending: General Surgery | Admitting: General Surgery

## 2019-08-14 ENCOUNTER — Encounter: Payer: Self-pay | Admitting: General Surgery

## 2019-08-14 ENCOUNTER — Other Ambulatory Visit: Payer: Self-pay

## 2019-08-14 DIAGNOSIS — Z09 Encounter for follow-up examination after completed treatment for conditions other than malignant neoplasm: Secondary | ICD-10-CM

## 2019-08-14 LAB — CBC WITH DIFFERENTIAL/PLATELET
Absolute Monocytes: 956 cells/uL — ABNORMAL HIGH (ref 200–950)
Basophils Absolute: 63 cells/uL (ref 0–200)
Basophils Relative: 0.6 %
Eosinophils Absolute: 221 cells/uL (ref 15–500)
Eosinophils Relative: 2.1 %
HCT: 45.3 % (ref 38.5–50.0)
Hemoglobin: 15.3 g/dL (ref 13.2–17.1)
Lymphs Abs: 2562 cells/uL (ref 850–3900)
MCH: 32.2 pg (ref 27.0–33.0)
MCHC: 33.8 g/dL (ref 32.0–36.0)
MCV: 95.4 fL (ref 80.0–100.0)
MPV: 9.9 fL (ref 7.5–12.5)
Monocytes Relative: 9.1 %
Neutro Abs: 6699 cells/uL (ref 1500–7800)
Neutrophils Relative %: 63.8 %
Platelets: 214 10*3/uL (ref 140–400)
RBC: 4.75 10*6/uL (ref 4.20–5.80)
RDW: 11.4 % (ref 11.0–15.0)
Total Lymphocyte: 24.4 %
WBC: 10.5 10*3/uL (ref 3.8–10.8)

## 2019-08-14 LAB — COMPLETE METABOLIC PANEL WITH GFR
AG Ratio: 1.5 (calc) (ref 1.0–2.5)
ALT: 18 U/L (ref 9–46)
AST: 18 U/L (ref 10–35)
Albumin: 3.9 g/dL (ref 3.6–5.1)
Alkaline phosphatase (APISO): 64 U/L (ref 35–144)
BUN/Creatinine Ratio: 24 (calc) — ABNORMAL HIGH (ref 6–22)
BUN: 16 mg/dL (ref 7–25)
CO2: 29 mmol/L (ref 20–32)
Calcium: 8.9 mg/dL (ref 8.6–10.3)
Chloride: 101 mmol/L (ref 98–110)
Creat: 0.68 mg/dL — ABNORMAL LOW (ref 0.70–1.25)
GFR, Est African American: 119 mL/min/{1.73_m2} (ref >=60)
GFR, Est Non African American: 102 mL/min/{1.73_m2} (ref >=60)
Globulin: 2.6 g/dL (calc) (ref 1.9–3.7)
Glucose, Bld: 117 mg/dL — ABNORMAL HIGH (ref 65–99)
Potassium: 4 mmol/L (ref 3.5–5.3)
Sodium: 135 mmol/L (ref 135–146)
Total Bilirubin: 0.3 mg/dL (ref 0.2–1.2)
Total Protein: 6.5 g/dL (ref 6.1–8.1)

## 2019-08-14 LAB — LIPID PANEL
Cholesterol: 141 mg/dL (ref <200)
HDL: 54 mg/dL (ref >=40)
LDL Cholesterol (Calc): 70 mg/dL (calc)
Non-HDL Cholesterol (Calc): 87 mg/dL (calc) (ref <130)
Total CHOL/HDL Ratio: 2.6 (calc) (ref <5.0)
Triglycerides: 87 mg/dL (ref <150)

## 2019-08-14 LAB — VITAMIN D 25 HYDROXY (VIT D DEFICIENCY, FRACTURES): Vit D, 25-Hydroxy: 51 ng/mL (ref 30–100)

## 2019-08-14 LAB — HEMOGLOBIN A1C
Hgb A1c MFr Bld: 5.5 % of total Hgb (ref <5.7)
Mean Plasma Glucose: 111 (calc)
eAG (mmol/L): 6.2 (calc)

## 2019-08-14 LAB — MAGNESIUM: Magnesium: 1.9 mg/dL (ref 1.5–2.5)

## 2019-08-14 LAB — TSH: TSH: 1.34 mIU/L (ref 0.40–4.50)

## 2019-08-14 LAB — INSULIN, RANDOM: Insulin: 58.3 u[IU]/mL — ABNORMAL HIGH

## 2019-08-14 NOTE — Progress Notes (Signed)
=========================================================  -    Total Chol = 141  and LDL Chol = 70 - Both  Excellent   - Very low risk for Heart Attack  / Stroke =============================================================  - A1c = 5.5% - Back in Normal Non-Diabetic Range - Great  ! =========================================================  - Vitamin D = 51 - Great  =========================================================  - All Else - CBC - Kidneys - Electrolytes - Liver - Magnesium & Thyroid    - all  Normal / OK ====================================================

## 2019-08-14 NOTE — Patient Instructions (Signed)
Continue to stay off foot as much as possible.  May open, but return if problematic.  Used padded dressings.

## 2019-08-14 NOTE — Progress Notes (Signed)
Here for postoperative visit for suture removal.  No foul-smell to wound.  No Pseudomonas smell.  I mention this specifically because the greenish discoloration of the peri incisional wound.  This came from the Band-Aid that was used.   Sutures removed, wound with small gap.  No drainage or infection.  Mildly tender.  Successful postopertive visit  Robert [name]

## 2019-08-21 ENCOUNTER — Ambulatory Visit: Payer: BC Managed Care – PPO | Admitting: General Surgery

## 2019-09-07 ENCOUNTER — Other Ambulatory Visit: Payer: Self-pay | Admitting: Family Medicine

## 2019-09-07 DIAGNOSIS — I10 Essential (primary) hypertension: Secondary | ICD-10-CM

## 2019-09-12 ENCOUNTER — Other Ambulatory Visit: Payer: Self-pay | Admitting: Internal Medicine

## 2019-09-12 DIAGNOSIS — I10 Essential (primary) hypertension: Secondary | ICD-10-CM

## 2019-09-12 DIAGNOSIS — E782 Mixed hyperlipidemia: Secondary | ICD-10-CM

## 2019-09-12 DIAGNOSIS — J449 Chronic obstructive pulmonary disease, unspecified: Secondary | ICD-10-CM

## 2019-09-12 MED ORDER — ATORVASTATIN CALCIUM 20 MG PO TABS: ORAL_TABLET | ORAL | 0 refills | Status: DC

## 2019-09-12 MED ORDER — BENAZEPRIL-HYDROCHLOROTHIAZIDE 20-25 MG PO TABS: ORAL_TABLET | ORAL | 0 refills | Status: DC

## 2019-09-12 MED ORDER — BREO ELLIPTA 100-25 MCG/INH IN AEPB: INHALATION_SPRAY | RESPIRATORY_TRACT | 0 refills | Status: DC

## 2019-11-15 ENCOUNTER — Encounter: Payer: Self-pay | Admitting: Adult Health Nurse Practitioner

## 2019-11-15 ENCOUNTER — Other Ambulatory Visit: Payer: Self-pay

## 2019-11-15 ENCOUNTER — Ambulatory Visit (INDEPENDENT_AMBULATORY_CARE_PROVIDER_SITE_OTHER): Payer: BC Managed Care – PPO | Admitting: Adult Health Nurse Practitioner

## 2019-11-15 VITALS — BP 120/68 | Ht 66.0 in | Wt 248.0 lb

## 2019-11-15 DIAGNOSIS — Z6839 Body mass index (BMI) 39.0-39.9, adult: Secondary | ICD-10-CM

## 2019-11-15 DIAGNOSIS — R7309 Other abnormal glucose: Secondary | ICD-10-CM

## 2019-11-15 DIAGNOSIS — E661 Drug-induced obesity: Secondary | ICD-10-CM

## 2019-11-15 DIAGNOSIS — I10 Essential (primary) hypertension: Secondary | ICD-10-CM

## 2019-11-15 DIAGNOSIS — Z79899 Other long term (current) drug therapy: Secondary | ICD-10-CM

## 2019-11-15 DIAGNOSIS — E782 Mixed hyperlipidemia: Secondary | ICD-10-CM | POA: Diagnosis not present

## 2019-11-15 DIAGNOSIS — E559 Vitamin D deficiency, unspecified: Secondary | ICD-10-CM

## 2019-11-15 DIAGNOSIS — J449 Chronic obstructive pulmonary disease, unspecified: Secondary | ICD-10-CM | POA: Diagnosis not present

## 2019-11-15 NOTE — Progress Notes (Signed)
FOLLOW UP 3 MONTH   Assessment and Plan:   Robert [name] was seen today for follow-up.  Diagnoses and all orders for this visit:  Essential hypertension Continue current medications: benazepril/HCTZ 20-25mg  daily in am. Monitor blood pressure at home; call if consistently over 130/80 Continue DASH diet.   Reminder to go to the ER if any CP, SOB, nausea, dizziness, severe HA, changes vision/speech, left arm numbness and tingling and jaw pain.  Mixed hyperlipidemia Continue medications: atorvastatin 20mg  nightly  Discussed dietary and exercise modifications Low fat diet  Chronic obstructive pulmonary disease, unspecified COPD type ([place]) Continue Breo Reminder to rinse mouth after use No triggers, well controlled symptoms, cont to monitor Has PRN albuterol, Rx in date  Abnormal glucose Discussed dietary and exercise modifications  Vitamin D deficiency Continue supplementation to maintain goal of 70-100 No supplementation at this time.  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   Continue diet and meds as discussed. Further disposition pending results of labs. Discussed med's effects and SE's.  Patient agrees with plan of care and opportunity to ask questions/voice concerns. Over 30 minutes of face to face interview, chart review, face to face interview, exam, counseling, and critical decision making was performed.   Future Appointments  Date Time Provider [facility]  03/21/2020 11:00 AM Unk [name], MD [facility] None    ----------------------------------------------------------------------------------------------------------------------  HPI 64 y.o. male  presents for 3 month follow up on HTN, HLD, abnormal glucose with history of pre-diabetes, weight and  vitamin D deficiency.   Reports overall he is doing well.  He has been managing foot pain.  Reports this summer he had a wart removed on the bottom on his left foot.  Reports though it has cleared up he is still having pain.  He is considering further follow up.  He recently has labs drawn at work: K 4.6, Na 136, Ca 9.1 Cre 0.68, GFR 102 AST 22, ALT 18 A1c 5.6% Cholesterol: total 139 Trig 70 HDL 53 LDL 72  BMI is Body mass index is 40.03 kg/m., he has not been working on diet and exercise. Wt Readings from Last 3 Encounters:  11/15/19 248 lb (112.5 kg)  08/13/19 246 lb 3.2 oz (111.7 kg)  08/02/19 (!) 247 lb (112 kg)     His blood pressure has been controlled at home, today their BP is BP: 120/68  He does not workout. He denies any cardiac symptoms, chest pains, palpitations, shortness of breath, dizziness or lower extremity edema.      He is on cholesterol medication Atorvastatin and denies myalgias.   His cholesterol is at goal. The cholesterol last visit was:   Lab Results  Component Value Date   CHOL 141 08/13/2019   HDL 54 08/13/2019   LDLCALC 70 08/13/2019   TRIG 87 08/13/2019   CHOLHDL 2.6 08/13/2019    He has not been working on diet and exercise for prediabetes, and denies polydipsia, polyuria, visual disturbances, vomiting and weight loss. Last A1C in the office was:  Lab Results  Component Value Date   HGBA1C 5.5 08/13/2019     Patient does not have history of CKD.  Their last GFR was:  Lab Results  Component Value Date   GFRNONAA 102 08/13/2019   Lab Results  Component Value Date   GFRAA 119 08/13/2019  Patient is not on Vitamin D supplement for deficiency. Lab Results  Component Value Date   VD25OH 51 08/13/2019        Current Medications:  Current Outpatient Medications on File Prior to Visit  Medication Sig  . albuterol (VENTOLIN HFA) 108 (90 Base) MCG/ACT inhaler INHALE 2 PUFFS BY MOUTH INTO THE LUNGS EVERY 6 HOURS AS NEEDED FOR  WHEEZING OR SHORTNESS OF BREATH  . aspirin EC 81 MG tablet Take 81 mg by mouth daily.  Marland Kitchen atorvastatin (LIPITOR) 20 MG tablet Take 1 tablet     Daily     for Cholesterol  . benazepril-hydrochlorthiazide (LOTENSIN HCT) 20-25 MG tablet Take 1 tablet     Daily     for BP  . fluticasone furoate-vilanterol (BREO ELLIPTA) 100-25 MCG/INH AEPB Use 1 inhalation     Daily  . Magnesium 500 MG TABS Take 500 mg by mouth daily.   No current facility-administered medications on file prior to visit.    Allergies:  Allergies  Allergen Reactions  . Codeine Other (See Comments)    REACTION: Itching  . Isovue [Iopamidol] Hives    Pt. Developed 1 hive after injection; Dr. Kris Hartmann spoke with patient;recommends 13 hr prep in the future      Medical History:  Past Medical History:  Diagnosis Date  . Arthritis    hands/knees  . 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 COPD (chronic obstructive pulmonary disease) ([place])   . Elevated hemoglobin A1c   . Elevated PSA 03/16/2016  . Hyperlipidemia   . Hypertension   . Personal history of colonic adenomas 12/28/2007  . Pre-diabetes   . Prostate cancer ([place]) 03/16/2016  . Vitamin D deficiency      Family history- Reviewed and unchanged Family History  Problem Relation Age of Onset  . Cancer Mother        lung  . Lung disease Neg Hx   . Colon cancer Neg Hx   . Rectal cancer Neg Hx   . Stomach cancer Neg Hx      Social history- Reviewed and unchanged Social History   Tobacco Use  . Smoking status: Current Every Day Smoker    Packs/day: 0.50    Years: 40.00    Pack years: 20.00    Types: Cigarettes  . Smokeless tobacco: Never Used  . Tobacco comment: has decreased smoking significantly with goal of quitting  Vaping Use  . Vaping Use: Never used  Substance Use Topics  . Alcohol use: Yes    Alcohol/week: 4.0 standard drinks    Types: 4 Cans of beer per week    Comment: occasionally  . Drug use: No     Names of Other  Physician/Practitioners you currently use: 1. [facility] here for primary care 2. Eye Exam: Due for 2021 3. Dental Exam UTD 2021   Patient Care Team: Unk [name], MD as PCP - General (Internal Medicine) Wyatt Portela, MD as Consulting Physician (Oncology) Tyler Pita, MD as Consulting Physician (Radiation Oncology)    Screening Tests: Immunization History  Administered Date(s) Administered  . Influenza,inj,Quad PF,6+ Mos 12/27/2017, 12/15/2018  . Janssen (J&J) SARS-COV-2 Vaccination 03/27/2019  . Pneumococcal Polysaccharide-23 01/11/1997, 12/27/2017  . Td 01/12/2004  . Tdap 07/09/2016  . Zoster 01/12/2008     Preventative Care: Last colonoscopy: 04/2019 Hep C screening (1945-1965): 12/05/2012 Negative COPD: PFT?    Review of Systems:  Review of Systems  Constitutional: Negative for chills, diaphoresis, fever, malaise/fatigue and weight loss.  HENT: Negative for congestion, ear discharge, ear pain, hearing loss, nosebleeds, sinus pain, sore throat and tinnitus.   Eyes: Negative for blurred vision, double vision, photophobia, pain, discharge and redness.  Respiratory: Negative for cough, hemoptysis, sputum production, shortness of breath, wheezing and stridor.   Cardiovascular: Negative for chest pain, palpitations, orthopnea, claudication, leg swelling and PND.  Gastrointestinal: Negative for abdominal pain, blood in stool, constipation, diarrhea, heartburn, melena, nausea and vomiting.  Genitourinary: Negative for dysuria, flank pain, frequency, hematuria and urgency.  Musculoskeletal: Negative for back pain, falls, joint pain, myalgias and neck pain.  Skin: Negative for itching and rash.  Neurological: Negative for dizziness, tingling, tremors, sensory change, speech change, focal weakness, seizures, loss of consciousness, weakness and headaches.  Endo/Heme/Allergies: Negative for environmental allergies and polydipsia.  Does not bruise/bleed easily.  Psychiatric/Behavioral: Negative for depression, hallucinations, memory loss, substance abuse and suicidal ideas. The patient is not nervous/anxious and does not have insomnia.       Physical Exam: BP 120/68   Ht 5\' 6"  (1.676 m)   Wt 248 lb (112.5 kg)   BMI 40.03 kg/m  Wt Readings from Last 3 Encounters:  11/15/19 248 lb (112.5 kg)  08/13/19 246 lb 3.2 oz (111.7 kg)  08/02/19 (!) 247 lb (112 kg)   General Appearance: Well nourished, in no apparent distress. Eyes: PERRLA, EOMs, conjunctiva no swelling or erythema Sinuses: No Frontal/maxillary tenderness ENT/Mouth: Ext aud canals clear, TMs without erythema, bulging. No erythema, swelling, or exudate on post pharynx.  Tonsils not swollen or erythematous. Hearing normal.  Neck: Supple, thyroid normal.  Respiratory: Respiratory effort normal, BS equal bilaterally without rales, rhonchi, wheezing or stridor.  Cardio: RRR with no MRGs. Brisk peripheral pulses without edema.  Abdomen: Soft, + BS.  Non tender, no guarding, rebound, hernias, masses. Lymphatics: Non tender without lymphadenopathy.  Musculoskeletal: Full ROM, 5/5 strength, Normal gait Skin: Warm, dry without rashes, lesions, ecchymosis.  Neuro: Cranial nerves intact. No cerebellar symptoms.  Psych: Awake and oriented X 3, normal affect, Insight and Judgment appropriate.    Garnet Sierras, Laqueta Jean, DNP [facility] 11/15/2019   4:45 PM

## 2019-11-26 ENCOUNTER — Other Ambulatory Visit: Payer: Self-pay

## 2019-11-26 DIAGNOSIS — J449 Chronic obstructive pulmonary disease, unspecified: Secondary | ICD-10-CM

## 2019-11-26 MED ORDER — BREO ELLIPTA 100-25 MCG/INH IN AEPB: INHALATION_SPRAY | RESPIRATORY_TRACT | 0 refills | Status: DC

## 2019-12-19 ENCOUNTER — Other Ambulatory Visit: Payer: Self-pay | Admitting: Internal Medicine

## 2019-12-19 DIAGNOSIS — E782 Mixed hyperlipidemia: Secondary | ICD-10-CM

## 2019-12-19 DIAGNOSIS — I10 Essential (primary) hypertension: Secondary | ICD-10-CM

## 2020-03-20 ENCOUNTER — Encounter: Payer: Self-pay | Admitting: Internal Medicine

## 2020-03-20 NOTE — Progress Notes (Signed)
Annual  Screening/Preventative Visit  & Comprehensive Evaluation & Examination      This very nice 65 y.o. WWM presents for a Screening /Preventative Visit & comprehensive evaluation and management of multiple medical co-morbidities.  Patient has been followed for HTN, HLD, Prediabetes and Vitamin D Deficiency. Patient  is s/p Robotic Prostatectomy for Ca (June 2018).       HTN predates since 2000. Patient's BP has been controlled at home.  Today's BP is at goal -  128/86. Patient denies any cardiac symptoms as chest pain, palpitations, shortness of breath, dizziness or ankle swelling.      Patient's hyperlipidemia is controlled with diet and medications. Patient denies myalgias or other medication SE's. Last lipids were at goal:  Lab Results  Component Value Date   CHOL 141 08/13/2019   HDL 54 08/13/2019   LDLCALC 70 08/13/2019   TRIG 87 08/13/2019   CHOLHDL 2.6 08/13/2019       Patient has hx/o Morbid Obesity (BMI 36+) and  PreDiabetes (A1c 5.7%/2009 & 5.8%/2012)  and patient denies reactive hypoglycemic symptoms, visual blurring, diabetic polys or paresthesias. Last A1c was normal & at goal:  Lab Results  Component Value Date   HGBA1C 5.5 08/13/2019        Finally, patient has history of Vitamin D Deficiency ("21"/2008) and last vitamin D was slightly low:  Lab Results  Component Value Date   VD25OH 51 08/13/2019    Current Outpatient Medications on File Prior to Visit  Medication Sig  . albuterol HFA  inhaler INHALE 2 PUFFS EVERY 6 HRS AS NEEDED   . aspirin EC 81 MG  Take daily.  Marland Kitchen atorvastatin 20 MG TAKE ONE TABLET DAILY   . benazepril-hctz) 20-25 MG  TAKE ONE TABLET DAILY  . BREO ELLIPTA  100-25  Use 1 inhalation Daily  . Magnesium 500 MG  Take  daily.    Allergies  Allergen Reactions  . Codeine Other (See Comments)    REACTION: Itching  . Isovue [Iopamidol] Hives    Pt. Developed 1 hive after injection; Dr. Kris Hartmann spoke with patient;recommends 13 hr prep in  the future    Past Medical History:  Diagnosis Date  . Arthritis    hands/knees  . 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 COPD (chronic obstructive pulmonary disease) ([place])   . Elevated hemoglobin A1c   . Elevated PSA 03/16/2016  . Hyperlipidemia   . Hypertension   . Personal history of colonic adenomas 12/28/2007  . Pre-diabetes   . Prostate cancer ([place]) 03/16/2016  . Vitamin D deficiency     Health Maintenance  Topic Date Due  . COVID-19 Vaccine (2 - Booster for Janssen series) 05/22/2019  . INFLUENZA VACCINE  08/12/2019  . COLONOSCOPY (Pts 45-50yrs Insurance coverage will need to be confirmed)  04/23/2022  . TETANUS/TDAP  07/10/2026  . Hepatitis C Screening  Completed  . HIV Screening  Completed  . HPV VACCINES  Aged Out    Immunization History  Administered Date(s) Administered  . Influenza,inj,Quad PF,6+ Mos 12/27/2017, 12/15/2018  . Janssen (J&J) SARS-COV-2 Vaccination 03/27/2019  . Pneumococcal Polysaccharide-23 01/11/1997, 12/27/2017  . Td 01/12/2004  . Tdap 07/09/2016  . Zoster 01/12/2008    Last Colon - 04/23/2019 - Dr Carlean Purl - Recc 3 year f/u due Apr 2024  Past Surgical History:  Procedure Laterality Date  . COLONOSCOPY  2009   CG  hems, TAs  . LYMPHADENECTOMY N/A 06/28/2016   Procedure: LYMPHADENECTOMY;  Surgeon: Raynelle Bring, MD;  Location: [facility];  Service: Urology;  Laterality: N/A;  . PROSTATE BIOPSY  03/16/2016   Surgical Pathology Gross and Microscopic Examination for Prostate Needle  . ROBOT ASSISTED LAPAROSCOPIC RADICAL PROSTATECTOMY N/A 06/28/2016   Procedure: XI ROBOTIC ASSISTED LAPAROSCOPIC RADICAL PROSTATECTOMY LEVEL 2 EXCISION OF PENILE LESION;  Surgeon: Raynelle Bring, MD;  Location: [facility];  Service: Urology;  Laterality: N/A;    Family History  Problem Relation Age of Onset  . Cancer Mother        lung  . Lung disease Neg Hx   . Colon cancer Neg Hx   . Rectal cancer Neg Hx   . Stomach cancer Neg Hx     Social History    Socioeconomic History  . Marital status: Widowed  . Number of children: None  Occupational History  .  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 Smoking status: Current Every Day Smoker    Packs/day: 0.50    Years: 40.00    Pack years: 20.00    Types: Cigarettes  . Smokeless tobacco: Never Used  . Tobacco comment: has decreased smoking significantly with goal of quitting  Vaping Use  . Vaping Use: Never used  Substance and Sexual Activity  . Alcohol use: Yes    Alcohol/week: 4.0 standard drinks    Types: 4 Cans of beer per week    Comment: occasionally  . Drug use: No  . Sexual activity: Yes  Social History Narrative   [facility] (06/29/16):   Currently works as a Furniture conservator/restorer. Exposure primarily to aluminum and steel. No history of bird or mold exposure. No recent hot tub exposure. Does have 2 dogs.     ROS Constitutional: Denies fever, chills, weight loss/gain, headaches, insomnia,  night sweats or change in appetite. Does c/o fatigue. Eyes: Denies redness, blurred vision, diplopia, discharge, itchy or watery eyes.  ENT: Denies discharge, congestion, post nasal drip, epistaxis, sore throat, earache, hearing loss, dental pain, Tinnitus, Vertigo, Sinus pain or snoring.  Cardio: Denies chest pain, palpitations, irregular heartbeat, syncope, dyspnea, diaphoresis, orthopnea, PND, claudication or edema Respiratory: denies cough, dyspnea, DOE, pleurisy, hoarseness, laryngitis or wheezing.  Gastrointestinal: Denies dysphagia, heartburn, reflux, water brash, pain, cramps, nausea, vomiting, bloating, diarrhea, constipation, hematemesis, melena, hematochezia, jaundice or hemorrhoids Genitourinary: Denies dysuria, frequency, urgency, nocturia, hesitancy, discharge, hematuria or flank pain Musculoskeletal: Denies arthralgia, myalgia, stiffness, Jt. Swelling, pain, limp or strain/sprain. Denies Falls. Skin: Denies puritis, rash, hives, warts, acne,  eczema or change in skin lesion Neuro: No weakness, tremor, incoordination, spasms, paresthesia or pain Psychiatric: Denies confusion, memory loss or sensory loss. Denies Depression. Endocrine: Denies change in weight, skin, hair change, nocturia, and paresthesia, diabetic polys, visual blurring or hyper / hypo glycemic episodes.  Heme/Lymph: No excessive bleeding, bruising or enlarged lymph nodes.  Physical Exam  BP 128/86   Temp 97.9 F (36.6 C)   Ht 5' 6.5" (1.689 m)   Wt 249 lb (112.9 kg)   BMI 39.59 kg/m   General Appearance:  Over nourished and well groomed and in no apparent distress.  Eyes: PERRLA, EOMs, conjunctiva no swelling or erythema, normal fundi and vessels. Sinuses: No frontal/maxillary tenderness ENT/Mouth: EACs patent / TMs  nl. Nares clear without erythema, swelling, mucoid exudates. Oral hygiene is good. No erythema, swelling, or exudate. Tongue normal, non-obstructing. Tonsils not swollen or erythematous. Hearing normal.  Neck: Supple, thyroid not palpable. No bruits, nodes or JVD. Respiratory: Respiratory effort normal.  BS equal and clear bilateral without rales, rhonci, wheezing or stridor. Cardio: Heart sounds are normal with regular rate and rhythm and no murmurs, rubs or gallops. Peripheral pulses are normal and equal bilaterally without edema. No aortic or femoral bruits. Chest: symmetric with normal excursions and percussion.  Abdomen: Soft, with Nl bowel sounds. Nontender, no guarding, rebound, hernias, masses, or organomegaly.  Lymphatics: Non tender without lymphadenopathy.  Musculoskeletal: Full ROM all peripheral extremities, joint stability, 5/5 strength, and normal gait. Skin: Warm and dry without rashes, lesions, cyanosis, clubbing or  ecchymosis.  Neuro: Cranial nerves intact, reflexes equal bilaterally. Normal muscle tone, no cerebellar symptoms. Sensation intact.  Pysch: Alert and oriented x 3 with normal affect, insight and judgment appropriate.    Assessment and Plan  1. Annual Preventative/Screening Exam    2. Essential hypertension  - EKG 12-Lead - [place], RETROPERITNL ABD,  LTD - CBC with Differential/Platelet - COMPLETE METABOLIC PANEL WITH GFR - Magnesium - TSH  3. Hyperlipidemia, mixed  - EKG 12-Lead - [place], RETROPERITNL ABD,  LTD - Lipid panel - TSH  4. Abnormal glucose  - EKG 12-Lead - [place], RETROPERITNL ABD,  LTD - Hemoglobin A1c - Insulin, random  5. Vitamin D deficiency  - VITAMIN D 25 Hydroxy   6. Class 2 severe obesity due to excess calories with serious  comorbidity and body mass index (BMI) of 39.0 to 39.9 in adult (HCC)  - TSH  7. Testosterone deficiency  - Testosterone  8. History of prostate cancer   9. Prostate cancer screening  - PSA  10. Screening for colorectal cancer  - POC Hemoccult Bld/Stl   11. Screening-pulmonary TB  - TB Skin Test  12. Screening for ischemic heart disease  - EKG 12-Lead  13. Smoker  - EKG 12-Lead - [place], RETROPERITNL ABD,  LTD  14. Screening for AAA (aortic abdominal aneurysm)  - [place], RETROPERITNL ABD,  LTD  15. Fatigue, unspecified type  - Iron,Total/Total Iron Binding Cap - Vitamin B12 - CBC with Differential/Platelet - TSH  16. Medication management  - Urinalysis, Routine w reflex microscopic - Microalbumin / creatinine urine ratio - CBC with Differential/Platelet - COMPLETE METABOLIC PANEL WITH GFR - Magnesium - Lipid panel - TSH - Hemoglobin A1c - Insulin, random - VITAMIN D 25 Hydroxy         Patient was counseled in prudent diet, weight control to achieve/maintain BMI less than 25, BP monitoring, regular exercise and medications as discussed.  Discussed med effects and SE's. Routine screening labs and tests as requested with regular follow-up as recommended. Over 40 minutes of exam, counseling, chart review and high complex critical decision making was performed   Kirtland Bouchard, MD

## 2020-03-20 NOTE — Patient Instructions (Signed)

## 2020-03-21 ENCOUNTER — Other Ambulatory Visit: Payer: Self-pay

## 2020-03-21 ENCOUNTER — Ambulatory Visit (INDEPENDENT_AMBULATORY_CARE_PROVIDER_SITE_OTHER): Payer: BC Managed Care – PPO | Admitting: Internal Medicine

## 2020-03-21 ENCOUNTER — Encounter: Payer: Self-pay | Admitting: Internal Medicine

## 2020-03-21 VITALS — BP 128/86 | Temp 97.9°F | Ht 66.5 in | Wt 249.0 lb

## 2020-03-21 DIAGNOSIS — Z79899 Other long term (current) drug therapy: Secondary | ICD-10-CM

## 2020-03-21 DIAGNOSIS — Z1211 Encounter for screening for malignant neoplasm of colon: Secondary | ICD-10-CM

## 2020-03-21 DIAGNOSIS — R5383 Other fatigue: Secondary | ICD-10-CM

## 2020-03-21 DIAGNOSIS — Z1329 Encounter for screening for other suspected endocrine disorder: Secondary | ICD-10-CM | POA: Diagnosis not present

## 2020-03-21 DIAGNOSIS — Z0000 Encounter for general adult medical examination without abnormal findings: Secondary | ICD-10-CM

## 2020-03-21 DIAGNOSIS — Z111 Encounter for screening for respiratory tuberculosis: Secondary | ICD-10-CM | POA: Diagnosis not present

## 2020-03-21 DIAGNOSIS — Z13220 Encounter for screening for lipoid disorders: Secondary | ICD-10-CM

## 2020-03-21 DIAGNOSIS — Z125 Encounter for screening for malignant neoplasm of prostate: Secondary | ICD-10-CM

## 2020-03-21 DIAGNOSIS — E559 Vitamin D deficiency, unspecified: Secondary | ICD-10-CM

## 2020-03-21 DIAGNOSIS — F17200 Nicotine dependence, unspecified, uncomplicated: Secondary | ICD-10-CM

## 2020-03-21 DIAGNOSIS — Z131 Encounter for screening for diabetes mellitus: Secondary | ICD-10-CM | POA: Diagnosis not present

## 2020-03-21 DIAGNOSIS — Z8546 Personal history of malignant neoplasm of prostate: Secondary | ICD-10-CM

## 2020-03-21 DIAGNOSIS — Z1389 Encounter for screening for other disorder: Secondary | ICD-10-CM | POA: Diagnosis not present

## 2020-03-21 DIAGNOSIS — I10 Essential (primary) hypertension: Secondary | ICD-10-CM

## 2020-03-21 DIAGNOSIS — R7309 Other abnormal glucose: Secondary | ICD-10-CM

## 2020-03-21 DIAGNOSIS — Z130 Encounter for screening for diseases of the blood and blood-forming organs and certain disorders involving the immune mechanism: Secondary | ICD-10-CM

## 2020-03-21 DIAGNOSIS — Z136 Encounter for screening for cardiovascular disorders: Secondary | ICD-10-CM | POA: Diagnosis not present

## 2020-03-21 DIAGNOSIS — Z6839 Body mass index (BMI) 39.0-39.9, adult: Secondary | ICD-10-CM

## 2020-03-21 DIAGNOSIS — E349 Endocrine disorder, unspecified: Secondary | ICD-10-CM

## 2020-03-21 DIAGNOSIS — N401 Enlarged prostate with lower urinary tract symptoms: Secondary | ICD-10-CM

## 2020-03-21 DIAGNOSIS — E782 Mixed hyperlipidemia: Secondary | ICD-10-CM

## 2020-03-21 DIAGNOSIS — R350 Frequency of micturition: Secondary | ICD-10-CM

## 2020-03-21 DIAGNOSIS — Z0001 Encounter for general adult medical examination with abnormal findings: Secondary | ICD-10-CM

## 2020-03-21 DIAGNOSIS — Z8249 Family history of ischemic heart disease and other diseases of the circulatory system: Secondary | ICD-10-CM

## 2020-03-21 MED ORDER — BREZTRI AEROSPHERE 160-9-4.8 MCG/ACT IN AERO: INHALATION_SPRAY | RESPIRATORY_TRACT | 1 refills | Status: DC

## 2020-03-21 NOTE — Addendum Note (Signed)
Addended by: Unk [name] on: 03/21/2020 08:00 PM   Modules accepted: Orders

## 2020-03-21 NOTE — Progress Notes (Signed)
AortaScan PER PROVIDER WNL  Diameter: less than 3 cm

## 2020-03-22 NOTE — Progress Notes (Signed)
============================================================ -   Test results slightly outside the reference range are not unusual. If there is anything important, I will review this with you,  otherwise it is considered normal test values.  If you have further questions,  please do not hesitate to contact me at the office or via My Chart.  ============================================================ ============================================================  -  Iron Levels are Normal  ============================================================ ============================================================  -   -  Vitamin B12 =  437 -     Very Low  (Ideal or Goal Vit B12 is between 450 - 1,100)   Low Vit B12 may be associated with Anemia , Fatigue,   Peripheral Neuropathy, Dementia, "Brain Fog", & Depression  - Recommend take a sub-lingual form of Vitamin B12 tablet   1,000 to 5,000 mcg tab that you dissolve under your tongue /Daily   - Can get Baron Sane - best price at [employer] or on [employer] ============================================================ ============================================================  -  PSA - Undetectable - Wonderful ! ============================================================ ============================================================  -  Testosterone - Normal   ============================================================ ============================================================  -  Total Chol = 160    and LDL chol = 79      -     Both     Excellent   - Very low risk for Heart Attack  / Stroke ============================================================ ============================================================  -  A1c - Normal - Great - No Diabetes  !  ============================================================ ============================================================  -  Vitamin D = 47 - very Low   - Vitamin D goal is  between 70-100.  Xxxxxxxxxxxxxxxxxxxxxxxxxxxxxxxxxxxxxxxxxxxxxxxxxxxxxxxxxxxxxxxxxxxxxx Xxxxxxxxxxxxxxxxxxxxxxxxxxxxxxxxxxxxxxxxxxxxxxxxxxxxxxxxxxxxxxxxxxxxxx   - Please Take  Vitamin D 5,000 unit capsules x 2 capsules = 10,000 units  /day   Xxxxxxxxxxxxxxxxxxxxxxxxxxxxxxxxxxxxxxxxxxxxxxxxxxxxxxxxxxxxxxxxxxxxxx Xxxxxxxxxxxxxxxxxxxxxxxxxxxxxxxxxxxxxxxxxxxxxxxxxxxxxxxxxxxxxxxxxxxxxx  - It is very important as a natural anti-inflammatory and helping the  immune system protect against viral infections, like the Covid-19    helping hair, skin, and nails, as well as reducing stroke and  heart attack risk.   - It helps your bones and helps with mood.  - It also decreases numerous cancer risks so please  take it as directed.   - Low Vit D is associated with a 200-300% higher risk for  CANCER   and 200-300% higher risk for HEART   ATTACK  &  STROKE.    - It is also associated with higher death rate at younger ages,   autoimmune diseases like Rheumatoid arthritis, Lupus,  Multiple Sclerosis.     - Also many other serious conditions, like depression, Alzheimer's  Dementia, infertility, muscle aches, fatigue, fibromyalgia   - just to name a few. ============================================================ ============================================================  -  All Else - CBC - Kidneys - Electrolytes - Liver - Magnesium & Thyroid    - all  Normal / OK ============================================================ ============================================================

## 2020-03-24 LAB — CBC WITH DIFFERENTIAL/PLATELET
Absolute Monocytes: 679 cells/uL (ref 200–950)
Basophils Absolute: 49 cells/uL (ref 0–200)
Basophils Relative: 0.5 %
Eosinophils Absolute: 146 cells/uL (ref 15–500)
Eosinophils Relative: 1.5 %
HCT: 51 % — ABNORMAL HIGH (ref 38.5–50.0)
Hemoglobin: 17.4 g/dL — ABNORMAL HIGH (ref 13.2–17.1)
Lymphs Abs: 1765 cells/uL (ref 850–3900)
MCH: 32.4 pg (ref 27.0–33.0)
MCHC: 34.1 g/dL (ref 32.0–36.0)
MCV: 95 fL (ref 80.0–100.0)
MPV: 9.6 fL (ref 7.5–12.5)
Monocytes Relative: 7 %
Neutro Abs: 7062 cells/uL (ref 1500–7800)
Neutrophils Relative %: 72.8 %
Platelets: 222 10*3/uL (ref 140–400)
RBC: 5.37 10*6/uL (ref 4.20–5.80)
RDW: 11.3 % (ref 11.0–15.0)
Total Lymphocyte: 18.2 %
WBC: 9.7 10*3/uL (ref 3.8–10.8)

## 2020-03-24 LAB — URINALYSIS, ROUTINE W REFLEX MICROSCOPIC
Bacteria, UA: NONE SEEN /HPF
Bilirubin Urine: NEGATIVE
Glucose, UA: NEGATIVE
Hgb urine dipstick: NEGATIVE
Hyaline Cast: NONE SEEN /LPF
Ketones, ur: NEGATIVE
Leukocytes,Ua: NEGATIVE
Nitrite: NEGATIVE
Specific Gravity, Urine: 1.022 (ref 1.001–1.03)
Squamous Epithelial / HPF: NONE SEEN /HPF (ref <=5)
WBC, UA: NONE SEEN /HPF (ref 0–5)
pH: 6 (ref 5.0–8.0)

## 2020-03-24 LAB — LIPID PANEL
Cholesterol: 160 mg/dL (ref <200)
HDL: 59 mg/dL (ref >=40)
LDL Cholesterol (Calc): 79 mg/dL (calc)
Non-HDL Cholesterol (Calc): 101 mg/dL (calc) (ref <130)
Total CHOL/HDL Ratio: 2.7 (calc) (ref <5.0)
Triglycerides: 122 mg/dL (ref <150)

## 2020-03-24 LAB — INSULIN, RANDOM: Insulin: 108 u[IU]/mL — ABNORMAL HIGH

## 2020-03-24 LAB — PSA: PSA: <0.04 ng/mL (ref <=4.0)

## 2020-03-24 LAB — COMPLETE METABOLIC PANEL WITH GFR
AG Ratio: 1.5 (calc) (ref 1.0–2.5)
ALT: 17 U/L (ref 9–46)
AST: 16 U/L (ref 10–35)
Albumin: 4.3 g/dL (ref 3.6–5.1)
Alkaline phosphatase (APISO): 73 U/L (ref 35–144)
BUN/Creatinine Ratio: 20 (calc) (ref 6–22)
BUN: 14 mg/dL (ref 7–25)
CO2: 32 mmol/L (ref 20–32)
Calcium: 9.4 mg/dL (ref 8.6–10.3)
Chloride: 97 mmol/L — ABNORMAL LOW (ref 98–110)
Creat: 0.69 mg/dL — ABNORMAL LOW (ref 0.70–1.25)
GFR, Est African American: 117 mL/min/{1.73_m2} (ref >=60)
GFR, Est Non African American: 101 mL/min/{1.73_m2} (ref >=60)
Globulin: 2.9 g/dL (calc) (ref 1.9–3.7)
Glucose, Bld: 128 mg/dL — ABNORMAL HIGH (ref 65–99)
Potassium: 4 mmol/L (ref 3.5–5.3)
Sodium: 137 mmol/L (ref 135–146)
Total Bilirubin: 0.6 mg/dL (ref 0.2–1.2)
Total Protein: 7.2 g/dL (ref 6.1–8.1)

## 2020-03-24 LAB — TSH: TSH: 1.11 mIU/L (ref 0.40–4.50)

## 2020-03-24 LAB — VITAMIN D 25 HYDROXY (VIT D DEFICIENCY, FRACTURES): Vit D, 25-Hydroxy: 47 ng/mL (ref 30–100)

## 2020-03-24 LAB — IRON, TOTAL/TOTAL IRON BINDING CAP
%SAT: 26 % (calc) (ref 20–48)
Iron: 83 ug/dL (ref 50–180)
TIBC: 324 mcg/dL (calc) (ref 250–425)

## 2020-03-24 LAB — MICROALBUMIN / CREATININE URINE RATIO
Creatinine, Urine: 178 mg/dL (ref 20–320)
Microalb Creat Ratio: 249 mcg/mg creat — ABNORMAL HIGH (ref <30)
Microalb, Ur: 44.4 mg/dL

## 2020-03-24 LAB — HEMOGLOBIN A1C
Hgb A1c MFr Bld: 5.5 % of total Hgb (ref <5.7)
Mean Plasma Glucose: 111 mg/dL
eAG (mmol/L): 6.2 mmol/L

## 2020-03-24 LAB — TESTOSTERONE: Testosterone: 576 ng/dL (ref 250–827)

## 2020-03-24 LAB — VITAMIN B12: Vitamin B-12: 437 pg/mL (ref 200–1100)

## 2020-03-24 LAB — MAGNESIUM: Magnesium: 1.9 mg/dL (ref 1.5–2.5)

## 2020-03-26 ENCOUNTER — Other Ambulatory Visit: Payer: Self-pay | Admitting: Internal Medicine

## 2020-03-26 LAB — TB SKIN TEST
Induration: 0 mm
TB Skin Test: NEGATIVE

## 2020-03-26 MED ORDER — DEXAMETHASONE 4 MG PO TABS: ORAL_TABLET | ORAL | 0 refills | Status: DC

## 2020-03-26 MED ORDER — MELOXICAM 15 MG PO TABS: ORAL_TABLET | ORAL | 0 refills | Status: DC

## 2020-04-07 ENCOUNTER — Other Ambulatory Visit: Payer: Self-pay | Admitting: Internal Medicine

## 2020-04-07 DIAGNOSIS — E782 Mixed hyperlipidemia: Secondary | ICD-10-CM

## 2020-04-07 DIAGNOSIS — I10 Essential (primary) hypertension: Secondary | ICD-10-CM

## 2020-04-07 MED ORDER — BENAZEPRIL-HYDROCHLOROTHIAZIDE 20-25 MG PO TABS: ORAL_TABLET | ORAL | 0 refills | Status: DC

## 2020-07-15 ENCOUNTER — Other Ambulatory Visit: Payer: Self-pay | Admitting: Internal Medicine

## 2020-07-15 DIAGNOSIS — I10 Essential (primary) hypertension: Secondary | ICD-10-CM

## 2020-07-15 MED ORDER — BENAZEPRIL-HYDROCHLOROTHIAZIDE 20-25 MG PO TABS: ORAL_TABLET | ORAL | 0 refills | Status: DC

## 2020-10-13 ENCOUNTER — Other Ambulatory Visit: Payer: Self-pay | Admitting: Internal Medicine

## 2020-10-13 DIAGNOSIS — I10 Essential (primary) hypertension: Secondary | ICD-10-CM

## 2020-10-13 MED ORDER — BENAZEPRIL-HYDROCHLOROTHIAZIDE 20-25 MG PO TABS: ORAL_TABLET | ORAL | 3 refills | Status: DC

## 2020-10-22 NOTE — Progress Notes (Signed)
FOLLOW UP 3 MONTH   Assessment and Plan:   Robert [name] was seen today for follow-up.  Diagnoses and all orders for this visit:  Essential hypertension -     CBC with Differential/Platelet - continue medications, DASH diet, exercise and monitor at home. Call if greater than 130/80.   Go to the ER if any chest pain, shortness of breath, nausea, dizziness, severe HA, changes vision/speech   Hyperlipidemia, mixed -     COMPLETE METABOLIC PANEL WITH GFR -     Lipid panel -     TSH Continue medication, low saturated fat diet and exercise  Abnormal glucose -     Hemoglobin A1c Continue diet and exercise  Class 2 severe obesity due to excess calories with serious comorbidity and body mass index (BMI) of 39.0 to 39.9 in adult [facility])  Long discussion on diet and exercise  Stress importance of fresh fruits/vegetables and fibers. Limit processed carbs, sugars and saturated fats  Vitamin D deficiency -     VITAMIN D 25 Hydroxy (Vit-D Deficiency, Fractures) Pt is not currently on supplementation  Smoker -     CT CHEST LUNG CA SCREEN LOW DOSE W/O CM; Future Strongly encouraged discontinuation of smoking. Pt counseled on risks  Chronic obstructive pulmonary disease, unspecified COPD type (HCC) -     albuterol (VENTOLIN HFA) 108 (90 Base) MCG/ACT inhaler; Inhale 2 puffs into the lungs every 6 (six) hours as needed for wheezing or shortness of breath. Continue Breztri daily and Albuterol PRN  Screening for hematuria or proteinuria -     Urinalysis, Routine w reflex microscopic -     Microalbumin / creatinine urine ratio  Medication management -     CBC with Differential/Platelet -     COMPLETE METABOLIC PANEL WITH GFR -     Lipid panel -     TSH -     Hemoglobin A1c -     VITAMIN D 25 Hydroxy (Vit-D Deficiency, Fractures) -     Magnesium -     Urinalysis, Routine w reflex microscopic -     Microalbumin / creatinine urine ratio   Skin Tag Area cleansed with alcohol and snipped with  scissors, silver nitrate applied and covered with bandaid.  Pt advised to keep area clean and dry.    Continue diet and meds as discussed. Further disposition pending results of labs. Discussed med's effects and SE's.  Patient agrees with plan of care and opportunity to ask questions/voice concerns. Over 30 minutes of face to face interview, chart review, face to face interview, exam, counseling, and critical decision making was performed.   Future Appointments  Date Time Provider Amo  03/31/2021 10:00 AM Unk [name], MD [facility] None    ----------------------------------------------------------------------------------------------------------------------  HPI 65 y.o. male  presents for 3 month follow up on HTN, HLD, abnormal glucose with history of pre-diabetes, weight and vitamin D deficiency.   Reports overall he is doing well.  He still has mild pain on bottom of left foot.  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   BMI is Body mass index is 41.24 kg/m., he has not been working on diet and exercise. Has not been exercising. Wt Readings from Last 3 Encounters:  10/23/20 259 lb 6.4 oz (117.7 kg)  03/21/20 249 lb (112.9 kg)  11/15/19 248 lb (112.5 kg)  Pt has skin tag on right cheek that he wants removed.    His blood pressure has been controlled at home, today their BP is BP: 134/80  He does not workout. He denies any cardiac symptoms, chest pains, palpitations, shortness of breath, dizziness or lower extremity edema.      He is on cholesterol medication Atorvastatin and denies myalgias.   His cholesterol is at goal. The cholesterol last visit was:   Lab Results  Component Value Date   CHOL 160 03/21/2020   HDL 59 03/21/2020   LDLCALC 79 03/21/2020   TRIG 122 03/21/2020   CHOLHDL 2.7 03/21/2020    He has not been working on diet and  exercise for prediabetes, and denies polydipsia, polyuria, visual disturbances, vomiting and weight loss. Last A1C in the office was:  Lab Results  Component Value Date   HGBA1C 5.5 03/21/2020     Patient does not have history of CKD.  Their last GFR was:  Lab Results  Component Value Date   GFRNONAA 101 03/21/2020     Patient is not on Vitamin D supplement for deficiency. Lab Results  Component Value Date   VD25OH 47 03/21/2020        Current Medications:  Current Outpatient Medications on File Prior to Visit  Medication Sig   aspirin EC 81 MG tablet Take 81 mg by mouth daily.   atorvastatin (LIPITOR) 20 MG tablet TAKE ONE TABLET BY MOUTH DAILY FOR CHOLESTEROL   benazepril-hydrochlorthiazide (LOTENSIN HCT) 20-25 MG tablet Take  1 tablet  Daily  for BP & Fluid Retention /Ankle Swelling   Budeson-Glycopyrrol-Formoterol (BREZTRI AEROSPHERE) 160-9-4.8 MCG/ACT AERO Use  2 Inhalations  15 minutes  Apart  2 x /day (every 12 hours)   Magnesium 500 MG TABS Take 500 mg by mouth daily.   No current facility-administered medications on file prior to visit.    Allergies:  Allergies  Allergen Reactions   Codeine Other (See Comments)    REACTION: Itching   Isovue [Iopamidol] Hives    Pt. Developed 1 hive after injection; Dr. 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   COPD (chronic obstructive pulmonary disease) (HCC)    Elevated hemoglobin A1c    Elevated PSA 03/16/2016   Hypertension    Personal history of colonic adenomas 12/28/2007   Vitamin D deficiency      Family history- Reviewed and unchanged Family History  Problem Relation Age of Onset   Cancer Mother        lung   Lung disease Neg Hx    Colon cancer Neg Hx    Rectal cancer Neg Hx    Stomach cancer Neg Hx      Social history- Reviewed and unchanged Social  History   Tobacco Use   Smoking status: Every Day    Packs/day: 0.50    Years: 40.00    Pack years: 20.00    Types: Cigarettes   Smokeless tobacco: Never   Tobacco comments:    has decreased smoking significantly with goal of quitting  Vaping Use   Vaping Use: Never used  Substance Use Topics   Alcohol use: Yes    Alcohol/week: 4.0 standard drinks    Types: 4 Cans of beer per week    Comment: occasionally   Drug use: No  Names of Other Physician/Practitioners you currently use: 1. [facility] here for primary care 2. Eye Exam: Due for 2021 3. Dental Exam UTD 2021   Patient Care Team: Unk [name], MD as PCP - General (Internal Medicine) Wyatt Portela, MD as Consulting Physician (Oncology) Tyler Pita, MD as Consulting Physician (Radiation Oncology)    Screening Tests: Immunization History  Administered Date(s) Administered   Influenza,inj,Quad PF,6+ Mos 12/27/2017, 12/15/2018   Janssen (J&J) SARS-COV-2 Vaccination 03/27/2019   PPD Test 03/21/2020   Pneumococcal Polysaccharide-23 01/11/1997, 12/27/2017   Td 01/12/2004   Tdap 07/09/2016   Zoster, Live 01/12/2008     Preventative Care: Last colonoscopy: 04/2019 Hep C screening (1945-1965): 12/05/2012 Negative COPD: PFT?   Review of Systems  Constitutional:  Negative for chills, fever and weight loss.  HENT:  Negative for congestion and hearing loss.   Eyes:  Negative for blurred vision and double vision.  Respiratory:  Negative for cough and shortness of breath.   Cardiovascular:  Negative for chest pain, palpitations, orthopnea and leg swelling.  Gastrointestinal:  Negative for abdominal pain, constipation, diarrhea, heartburn, nausea and vomiting.  Genitourinary:  Negative for dysuria.  Musculoskeletal:  Negative for falls, joint pain and myalgias.  Skin:  Negative for rash.       Skin tag right cheek  Neurological:  Negative for dizziness, tingling,  tremors, loss of consciousness and headaches.  Endo/Heme/Allergies:  Does not bruise/bleed easily.  Psychiatric/Behavioral:  Negative for depression, memory loss and suicidal ideas.       Physical Exam: BP 134/80   Pulse 72   Temp 97.7 F (36.5 C)   Wt 259 lb 6.4 oz (117.7 kg)   SpO2 93%   BMI 41.24 kg/m  Wt Readings from Last 3 Encounters:  10/23/20 259 lb 6.4 oz (117.7 kg)  03/21/20 249 lb (112.9 kg)  11/15/19 248 lb (112.5 kg)   General Appearance: Well nourished, in no apparent distress. Eyes: PERRLA, EOMs, conjunctiva no swelling or erythema Sinuses: No Frontal/maxillary tenderness ENT/Mouth: Ext aud canals clear, TMs without erythema, bulging. No erythema, swelling, or exudate on post pharynx.  Tonsils not swollen or erythematous. Hearing normal.  Neck: Supple, thyroid normal.  Respiratory: Respiratory effort normal, BS equal bilaterally without rales, rhonchi, wheezing or stridor.  Cardio: RRR with no MRGs. Brisk peripheral pulses without edema.  Abdomen: Soft, + BS.  Non tender, no guarding, rebound, hernias, masses. Lymphatics: Non tender without lymphadenopathy.  Musculoskeletal: Full ROM, 5/5 strength, Normal gait Skin: Warm, dry . Skin tag on right cheek. Area cleansed with alcohol snipped with scissors and silver nitrate applied Neuro: Cranial nerves intact. No cerebellar symptoms.  Psych: Awake and oriented X 3, normal affect, Insight and Judgment appropriate.   S: The patient complains of symptomatic skin tags on the right cheek. These are irritated by clothing, jewelry and rubbing.  O: Patient appears well. One benign skin tags are noted on the right cheek.   A: Skin tags   P: Skin tags are snipped off using Alcohol for cleansing and sterile iris scissors. Local anesthesia was not used. These pathognomonic lesions are not sent for pathology.   Robert [name] ANP-C  [facility]  10/23/2020

## 2020-10-23 ENCOUNTER — Ambulatory Visit: Payer: BC Managed Care – PPO | Admitting: Nurse Practitioner

## 2020-10-23 ENCOUNTER — Encounter: Payer: Self-pay | Admitting: Nurse Practitioner

## 2020-10-23 ENCOUNTER — Other Ambulatory Visit: Payer: Self-pay

## 2020-10-23 VITALS — BP 134/80 | HR 72 | Temp 97.7°F | Wt 259.4 lb

## 2020-10-23 DIAGNOSIS — R7309 Other abnormal glucose: Secondary | ICD-10-CM

## 2020-10-23 DIAGNOSIS — Z79899 Other long term (current) drug therapy: Secondary | ICD-10-CM

## 2020-10-23 DIAGNOSIS — Z6839 Body mass index (BMI) 39.0-39.9, adult: Secondary | ICD-10-CM

## 2020-10-23 DIAGNOSIS — D485 Neoplasm of uncertain behavior of skin: Secondary | ICD-10-CM | POA: Diagnosis not present

## 2020-10-23 DIAGNOSIS — J449 Chronic obstructive pulmonary disease, unspecified: Secondary | ICD-10-CM

## 2020-10-23 DIAGNOSIS — I10 Essential (primary) hypertension: Secondary | ICD-10-CM

## 2020-10-23 DIAGNOSIS — E782 Mixed hyperlipidemia: Secondary | ICD-10-CM | POA: Diagnosis not present

## 2020-10-23 DIAGNOSIS — L918 Other hypertrophic disorders of the skin: Secondary | ICD-10-CM

## 2020-10-23 DIAGNOSIS — Z1389 Encounter for screening for other disorder: Secondary | ICD-10-CM

## 2020-10-23 DIAGNOSIS — F17200 Nicotine dependence, unspecified, uncomplicated: Secondary | ICD-10-CM

## 2020-10-23 DIAGNOSIS — E559 Vitamin D deficiency, unspecified: Secondary | ICD-10-CM

## 2020-10-23 MED ORDER — ALBUTEROL SULFATE HFA 108 (90 BASE) MCG/ACT IN AERS: 2.0000 | INHALATION_SPRAY | Freq: Four times a day (QID) | RESPIRATORY_TRACT | 3 refills | Status: DC | PRN

## 2020-10-23 NOTE — Patient Instructions (Signed)
Skin Tag, Adult  A skin tag (acrochordon) is a soft, extra growth of skin. Most skin tags are skin-colored and rarely bigger than a pencil eraser. They commonly form in areas where there is frequent rubbing, or friction, on the skin. This may be where there are folds in the skin, such as the eyelids, neck, armpit, or groin. Skin tags are not dangerous, and they do not spread from person to person (are not contagious). You may have one skin tag or several. Skin tags do not require treatment. However, your health care provider may recommend removal of a skin tag if it: Gets irritated from clothing or jewelry. Bleeds. Is visible and unsightly. What are the causes? This condition is linked with: Increasing age. Pregnancy. Diabetes. Obesity. What are the signs or symptoms? Skin tags usually do not cause symptoms unless they get irritated by items touching your skin, such as clothing or jewelry. When this happens, you may have pain, itching, or bleeding. How is this diagnosed? This condition is diagnosed with an evaluation from your health care provider. No testing is needed for diagnosis. How is this treated? Treatment for this condition depends on whether you have symptoms. If a skin tag needs to be removed, your health care provider can remove it with: A simple surgical procedure using scissors. A procedure that involves freezing your skin tag with a gas in liquid form (liquid nitrogen). A procedure that uses heat to destroy your skin tag (electrodessication). Your health care provider may also remove your skin tag if it is visible or unsightly, Follow these instructions at home: Watch for any changes in your skin tag. A normal skin tag does not require any other special care at home. Take over-the-counter and prescription medicines only as told by your health care provider. Keep all follow-up visits as told by your health care provider. This is important. Contact a health care provider  if: You have a skin tag that: Becomes painful. Changes color. Bleeds. Swells. Summary Skin tags are soft, extra growths of skin found in areas of frequent rubbing or friction. Skin tags usually do not cause symptoms. If symptoms occur, you may have pain, itching, or bleeding. If your skin tag causes symptoms or is unsightly, your health care provider can remove it. This information is not intended to replace advice given to you by your health care provider. Make sure you discuss any questions you have with your health care provider. Document Revised: 10/30/2018 Document Reviewed: 10/30/2018 Elsevier Patient Education  2022 Elsevier Inc.  

## 2020-10-24 LAB — COMPLETE METABOLIC PANEL WITH GFR
AG Ratio: 1.5 (calc) (ref 1.0–2.5)
ALT: 18 U/L (ref 9–46)
AST: 18 U/L (ref 10–35)
Albumin: 3.9 g/dL (ref 3.6–5.1)
Alkaline phosphatase (APISO): 54 U/L (ref 35–144)
BUN: 15 mg/dL (ref 7–25)
CO2: 31 mmol/L (ref 20–32)
Calcium: 9.2 mg/dL (ref 8.6–10.3)
Chloride: 100 mmol/L (ref 98–110)
Creat: 0.76 mg/dL (ref 0.70–1.35)
Globulin: 2.6 g/dL (calc) (ref 1.9–3.7)
Glucose, Bld: 97 mg/dL (ref 65–99)
Potassium: 4.2 mmol/L (ref 3.5–5.3)
Sodium: 137 mmol/L (ref 135–146)
Total Bilirubin: 0.5 mg/dL (ref 0.2–1.2)
Total Protein: 6.5 g/dL (ref 6.1–8.1)
eGFR: 101 mL/min/{1.73_m2} (ref >=60)

## 2020-10-24 LAB — TSH: TSH: 1.5 mIU/L (ref 0.40–4.50)

## 2020-10-24 LAB — CBC WITH DIFFERENTIAL/PLATELET
Absolute Monocytes: 1071 cells/uL — ABNORMAL HIGH (ref 200–950)
Basophils Absolute: 51 cells/uL (ref 0–200)
Basophils Relative: 0.6 %
Eosinophils Absolute: 221 cells/uL (ref 15–500)
Eosinophils Relative: 2.6 %
HCT: 43.7 % (ref 38.5–50.0)
Hemoglobin: 14.8 g/dL (ref 13.2–17.1)
Lymphs Abs: 2134 cells/uL (ref 850–3900)
MCH: 32.5 pg (ref 27.0–33.0)
MCHC: 33.9 g/dL (ref 32.0–36.0)
MCV: 95.8 fL (ref 80.0–100.0)
MPV: 9.3 fL (ref 7.5–12.5)
Monocytes Relative: 12.6 %
Neutro Abs: 5024 cells/uL (ref 1500–7800)
Neutrophils Relative %: 59.1 %
Platelets: 271 10*3/uL (ref 140–400)
RBC: 4.56 10*6/uL (ref 4.20–5.80)
RDW: 11.6 % (ref 11.0–15.0)
Total Lymphocyte: 25.1 %
WBC: 8.5 10*3/uL (ref 3.8–10.8)

## 2020-10-24 LAB — URINALYSIS, ROUTINE W REFLEX MICROSCOPIC
Bilirubin Urine: NEGATIVE
Glucose, UA: NEGATIVE
Hgb urine dipstick: NEGATIVE
Ketones, ur: NEGATIVE
Leukocytes,Ua: NEGATIVE
Nitrite: NEGATIVE
Protein, ur: NEGATIVE
Specific Gravity, Urine: 1.011 (ref 1.001–1.035)
pH: 7 (ref 5.0–8.0)

## 2020-10-24 LAB — LIPID PANEL
Cholesterol: 152 mg/dL (ref <200)
HDL: 54 mg/dL (ref >=40)
LDL Cholesterol (Calc): 78 mg/dL (calc)
Non-HDL Cholesterol (Calc): 98 mg/dL (calc) (ref <130)
Total CHOL/HDL Ratio: 2.8 (calc) (ref <5.0)
Triglycerides: 112 mg/dL (ref <150)

## 2020-10-24 LAB — MICROALBUMIN / CREATININE URINE RATIO
Creatinine, Urine: 47 mg/dL (ref 20–320)
Microalb Creat Ratio: 62 mcg/mg creat — ABNORMAL HIGH (ref <30)
Microalb, Ur: 2.9 mg/dL

## 2020-10-24 LAB — HEMOGLOBIN A1C
Hgb A1c MFr Bld: 5.7 % of total Hgb — ABNORMAL HIGH (ref <5.7)
Mean Plasma Glucose: 117 mg/dL
eAG (mmol/L): 6.5 mmol/L

## 2020-10-24 LAB — VITAMIN D 25 HYDROXY (VIT D DEFICIENCY, FRACTURES): Vit D, 25-Hydroxy: 59 ng/mL (ref 30–100)

## 2020-10-24 LAB — MAGNESIUM: Magnesium: 1.9 mg/dL (ref 1.5–2.5)

## 2020-12-07 ENCOUNTER — Other Ambulatory Visit: Payer: Self-pay | Admitting: Internal Medicine

## 2020-12-07 MED ORDER — BREZTRI AEROSPHERE 160-9-4.8 MCG/ACT IN AERO: INHALATION_SPRAY | RESPIRATORY_TRACT | 1 refills | Status: DC

## 2021-03-30 ENCOUNTER — Encounter: Payer: Self-pay | Admitting: Internal Medicine

## 2021-03-30 NOTE — Patient Instructions (Signed)
Due to recent changes in healthcare laws, you may see the results of your imaging and laboratory studies on MyChart before your provider has had a chance to review them.  We understand that in some cases there may be results that are confusing or concerning to you. Not all laboratory results come back in the same time frame and the provider may be waiting for multiple results in order to interpret others.  Please give [place] 48 hours in order for your provider to thoroughly review all the results before contacting the office for clarification of your results.  ? ?++++++++++++++++++++++++++++++++++++++ ? Vit D  & ?Vit C 1,000 mg   ?are recommended to help protect  ?against the Covid-19 and other Corona viruses.  ? ? Also it's recommended  ?to take  ?Zinc 50 mg  ?to help  ?protect against the Covid-19   ?and best place to get ? is also on [employer]  ?and don't pay more than 6-8 cents /pill !  ?=============================== ?Coronavirus (COVID-19) Are you at risk? ? ?Are you at risk for the Coronavirus (COVID-19)? ? ?To be considered HIGH RISK for Coronavirus (COVID-19), you have to meet the following criteria: ? ?Traveled to [place], [place], [place], [place] or [place]; or in the [place] to [place], [place], [place]  ?or [place]; and have fever, cough, and shortness of breath within the last 2 weeks of travel OR ?Been in close contact with a person diagnosed with COVID-19 within the last 2 weeks and have  ?fever, cough,and shortness of breath ? ?IF YOU DO NOT MEET THESE CRITERIA, YOU ARE CONSIDERED LOW RISK FOR COVID-19. ? ?What to do if you are HIGH RISK for COVID-19? ? ?If you are having a medical emergency, call 911. ?Seek medical care right away. Before you go to a doctor?s office, urgent care or emergency department, ? call ahead and tell them about your recent travel, contact with someone diagnosed with COVID-19  ? and your symptoms.  ?You should receive instructions from your physician?s office  regarding next steps of care.  ?When you arrive at healthcare provider, tell the healthcare staff immediately you have returned from  ?visiting [place], [place], [place], [place] or [place]; or traveled in the [place] to [place], [place],  ?[place] or [place] in the last two weeks or you have been in close contact with a person diagnosed with  ?COVID-19 in the last 2 weeks.   ?Tell the health care staff about your symptoms: fever, cough and shortness of breath. ?After you have been seen by a medical provider, you will be either: ?Tested for (COVID-19) and discharged home on quarantine except to seek medical care if  ?symptoms worsen, and asked to  ?Stay home and avoid contact with others until you get your results (4-5 days)  ?Avoid travel on public transportation if possible (such as bus, train, or airplane) or ?Sent to the Emergency Department by EMS for evaluation, COVID-19 testing  and  ?possible admission depending on your condition and test results. ? ?What to do if you are LOW RISK for COVID-19? ? ?Reduce your risk of any infection by using the same precautions used for avoiding the common cold or flu:  ?Wash your hands often with soap and warm water for at least 20 seconds.  If soap and water are not readily available,  ?use an alcohol-based hand sanitizer with at least 60% alcohol.  ?If coughing or sneezing, cover your mouth and nose by coughing  or sneezing into the elbow areas of your shirt or coat, ? into a tissue or into your sleeve (not your hands). ?Avoid shaking hands with others and consider head nods or verbal greetings only. ?Avoid touching your eyes, nose, or mouth with unwashed hands.  ?Avoid close contact with people who are sick. ?Avoid places or events with large numbers of people in one location, like concerts or sporting events. ?Carefully consider travel plans you have or are making. ?If you are planning any travel outside or inside the [place], visit the CDC?s Travelers? Health  webpage for the latest health notices. ?If you have some symptoms but not all symptoms, continue to monitor at home and seek medical attention  ?if your symptoms worsen. ?If you are having a medical emergency, call 911. ?>>>>>>>>>>>>>>>>>>>>>>>>>>>> ?Preventive Care for Adults ? ?A healthy lifestyle and preventive care can promote health and wellness. Preventive health guidelines for men include the following key practices: ?A routine yearly physical is a good way to check with your health care provider about your health and preventative screening. It is a chance to share any concerns and updates on your health and to receive a thorough exam. ?Visit your dentist for a routine exam and preventative care every 6 months. Brush your teeth twice a day and floss once a day. Good oral hygiene prevents tooth decay and gum disease. ?The frequency of eye exams is based on your age, health, family medical history, use of contact lenses, and other factors. Follow your health care provider's recommendations for frequency of eye exams. ?Eat a healthy diet. Foods such as vegetables, fruits, whole grains, low-fat dairy products, and lean protein foods contain the nutrients you need without too many calories. Decrease your intake of foods high in solid fats, added sugars, and salt. Eat the right amount of calories for you. Get information about a proper diet from your health care provider, if necessary. ?Regular physical exercise is one of the most important things you can do for your health. Most adults should get at least 150 minutes of moderate-intensity exercise (any activity that increases your heart rate and causes you to sweat) each week. In addition, most adults need muscle-strengthening exercises on 2 or more days a week. ?Maintain a healthy weight. The body mass index (BMI) is a screening tool to identify possible weight problems. It provides an estimate of body fat based on height and weight. Your health care provider can  find your BMI and can help you achieve or maintain a healthy weight. For adults 20 years and older: ?A BMI below 18.5 is considered underweight. ?A BMI of 18.5 to 24.9 is normal. ?A BMI of 25 to 29.9 is considered overweight. ?A BMI of 30 and above is considered obese. ?Maintain normal blood lipids and cholesterol levels by exercising and minimizing your intake of saturated fat. Eat a balanced diet with plenty of fruit and vegetables. Blood tests for lipids and cholesterol should begin at age 88 and be repeated every 5 years. If your lipid or cholesterol levels are high, you are over 50, or you are at high risk for heart disease, you may need your cholesterol levels checked more frequently. Ongoing high lipid and cholesterol levels should be treated with medicines if diet and exercise are not working. ?If you smoke, find out from your health care provider how to quit. If you do not use tobacco, do not start. ?Lung cancer screening is recommended for adults aged 51-80 years who are at high risk for  developing lung cancer because of a history of smoking. A yearly low-dose CT scan of the lungs is recommended for people who have at least a 30-pack-year history of smoking and are a current smoker or have quit within the past 15 years. A pack year of smoking is smoking an average of 1 pack of cigarettes a day for 1 year (for example: 1 pack a day for 30 years or 2 packs a day for 15 years). Yearly screening should continue until the smoker has stopped smoking for at least 15 years. Yearly screening should be stopped for people who develop a health problem that would prevent them from having lung cancer treatment. ?If you choose to drink alcohol, do not have more than 2 drinks per day. One drink is considered to be 12 ounces (355 mL) of beer, 5 ounces (148 mL) of wine, or 1.5 ounces (44 mL) of liquor. ?Avoid use of street drugs. Do not share needles with anyone. Ask for help if you need support or instructions about  stopping the use of drugs. ?High blood pressure causes heart disease and increases the risk of stroke. Your blood pressure should be checked at least every 1-2 years. Ongoing high blood pressure should be treated

## 2021-03-30 NOTE — Progress Notes (Signed)
? ?Annual  Screening/Preventative Visit  ?& Comprehensive Evaluation & Examination ? ?Future Appointments  ?Date Time Provider Department  ?03/31/2021 10:00 AM Unk [name], MD [facility]  ?04/06/2022 10:00 AM Unk [name], MD [facility]  ? ?    ?     This very nice 66 y.o. WWM  presents for a Screening /Preventative Visit & comprehensive evaluation and management of multiple medical co-morbidities.  Patient has been followed for HTN, HLD, Prediabetes and Vitamin D Deficiency.   In June, 2018, patient  underwent a Robotic Prostatectomy for Ca .  ? ? ?    HTN predates circa 2004. Patient's BP has been controlled at home.  Today's BP was initially elevated & rechecked at  goal  - 134/76. Patient denies any cardiac symptoms as chest pain, palpitations, shortness of breath, dizziness or ankle swelling. ? ? ?    Patient's hyperlipidemia is controlled with diet and Atorvastatin.   Patient denies myalgias or other medication SE's. Last lipids were at goal : ? ?Lab Results  ?Component Value Date  ? CHOL 152 10/23/2020  ? HDL 54 10/23/2020  ? [place]ier 78 10/23/2020  ? TRIG 112 10/23/2020  ? CHOLHDL 2.8 10/23/2020  ? ? ? ?    Patient has hx/o  Morbid Obesity  (BMI 36+) and consequent prediabetes (A1c 5.7%/2009 & 5.8%/2012)  and patient denies reactive hypoglycemic symptoms, visual blurring, diabetic polys or paresthesias. Last A1c was near goal : ?  ?Lab Results  ?Component Value Date  ? HGBA1C 5.7 (H) 10/23/2020  ?  ? ? ?    Finally, patient has history of Vitamin D Deficiency ("21"/2008) and last vitamin D was at goal : ?  ?Lab Results  ?Component Value Date  ? VD25OH 59 10/23/2020  ? ? ? ?Current Outpatient Medications on File Prior to Visit  ?Medication Sig  ? albuterol HFA  inhaler Inhale 2 puffs  every 6  hours as needed   ? aspirin EC 81 MG tablet Take  daily.  ? atorvastatin 20 MG tablet TAKE ONE TABLET DAILY  ? benazepril-hctz 20-25 MG tablet Take  1 tablet  Daily  ? BREZTRI 160-9-4.8  Use  2 Inhalations   2 x  /day (every 12 hours)  ? Magnesium 500 MG TABS Take  daily.  ? ? ? ?Allergies  ?Allergen Reactions  ? Codeine Other (See Comments)  ?  REACTION: Itching  ? Isovue [Iopamidol] Hives  ?  Pt. Developed 1 hive after injection; Dr. Kris Hartmann spoke with patient; recommends 13 hr prep in the future  ? ? ? ?Past Medical History:  ?Diagnosis Date  ? Arthritis   ? hands/knees  ? 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 COPD (chronic obstructive pulmonary disease) ([place])   ? Elevated hemoglobin A1c   ? Elevated PSA 03/16/2016  ? Hypertension   ? Personal history of colonic adenomas 12/28/2007  ? Vitamin D deficiency   ? ? ? ?Health Maintenance  ?Topic Date Due  ? Zoster Vaccines- Shingrix (1 of 2) Never done  ? COVID-19 Vaccine (2 - Janssen risk series) 04/24/2019  ? INFLUENZA VACCINE  08/11/2020  ? TETANUS/TDAP  07/10/2026  ? Hepatitis C Screening  Completed  ? HIV Screening  Completed  ? HPV VACCINES  Aged Out  ? ? ? ?Immunization History  ?Administered Date(s) Administered  ? Influenza,inj,Quad  12/27/2017, 12/15/2018  ? Janssen (J&J) SARS-COV-2 Vacc 03/27/2019  ? PPD Test 03/21/2020  ? Pneumococcal -23 01/11/1997, 12/27/2017  ? Td 01/12/2004  ? Tdap 07/09/2016  ?  Zoster, Live 01/12/2008  ? ? ?Last Colon - 04/23/2019 - Dr Carlean Purl - Recc recall 3 year F/U - Apr 20214 ? ? ?Past Surgical History:  ?Procedure Laterality Date  ? COLONOSCOPY  2009  ? CG  hems, TAs  ? LYMPHADENECTOMY N/A 06/28/2016  ? Procedure: Orlan Leavens, MD  ? PROSTATE BIOPSY  03/16/2016  ? Surgical Pathology Gross and Microscopic Examination for Prostate Needle  ? ROBOT ASSISTED LAPAROSCOPIC RADICAL PROSTATECTOMY N/A 06/28/2016  ? Procedure: XI ROBOTIC ASSISTED LAPAROSCOPIC RADICAL PROSTATECTOMY LEVEL 2 EXCISION OF PENILE LESION;   Raynelle Bring, MD  ? ? ? ?Family History  ?Problem Relation Age of Onset  ? Cancer Mother   ?     lung  ? Lung disease Neg Hx   ? Colon cancer Neg Hx   ? Rectal cancer Neg Hx   ? Stomach cancer Neg Hx   ? ? ? ?Social  History  ? ?Socioeconomic History  ? Marital status: Widowed  ? Number of children: None  ?Occupational History  ?  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 No history of bird or mold exposure. No recent hot tub exposure. Does have 2 dogs. ?Tobacco Use  ? Smoking status: Every Day  ?  Packs/day: 0.50  ?  Years: 40.00  ?  Pack years: 20.00  ?  Types: Cigarettes  ? Smokeless tobacco: Never  ? Tobacco comments:  ?  has decreased smoking significantly with goal of quitting  ?Vaping Use  ? Vaping Use: Never used  ?Substance Use Topics  ? Alcohol use: Yes  ?  Alcohol/week: 4.0 standard drinks  ?  Types: 4 Cans of beer per week  ?  Comment: occasionally  ? Drug use: No  ? ? ? ? ROS ?Constitutional: Denies fever, chills, weight loss/gain, headaches, insomnia,  night sweats or change in appetite. Does c/o fatigue. ?Eyes: Denies redness, blurred vision, diplopia, discharge, itchy or watery eyes.  ?ENT: Denies discharge, congestion, post nasal drip, epistaxis, sore throat, earache, hearing loss, dental pain, Tinnitus, Vertigo, Sinus pain or snoring.  ?Cardio: Denies chest pain, palpitations, irregular heartbeat, syncope, dyspnea, diaphoresis, orthopnea, PND, claudication or edema ?Respiratory: denies cough, dyspnea, DOE, pleurisy, hoarseness, laryngitis or wheezing.  ?Gastrointestinal: Denies dysphagia, heartburn, reflux, water brash, pain, cramps, nausea, vomiting, bloating, diarrhea, constipation, hematemesis, melena, hematochezia, jaundice or hemorrhoids ?Genitourinary: Denies dysuria, frequency, urgency, nocturia, hesitancy, discharge, hematuria or flank pain ?Musculoskeletal: Denies arthralgia, myalgia, stiffness, Jt. Swelling, pain, limp or strain/sprain. Denies Falls. ?Skin: Denies puritis, rash, hives, warts, acne, eczema or change in skin lesion ?Neuro: No weakness, tremor, incoordination, spasms, paresthesia or pain ?Psychiatric: Denies confusion, memory  loss or sensory loss. Denies Depression. ?Endocrine: Denies change in weight, skin, hair change, nocturia, and paresthesia, diabetic polys, visual blurring or hyper / hypo glycemic episodes.  ?Heme/Lymph: No excessive bleeding, bruising or enlarged lymph nodes. ? ? ?Physical Exam ? ?BP 134/76   Pulse 73   Temp 97.9 ?F (36.6 ?C)   Resp 16   Ht '5\' 6"'$  (1.676 m)   Wt 259 lb 3.2 oz (117.6 kg)   SpO2 95%   BMI 41.84 kg/m?  ? ?General Appearance: Well nourished and well groomed and in no apparent distress. ? ?Eyes: PERRLA, EOMs, conjunctiva no swelling or erythema, normal fundi and vessels. ?Sinuses: No frontal/maxillary tenderness ?ENT/Mouth: EACs patent / TMs  nl. Nares clear without erythema, swelling, mucoid exudates. Oral hygiene is good. No  erythema, swelling, or exudate. Tongue normal, non-obstructing. Tonsils not swollen or erythematous. Hearing normal.  ?Neck: Supple, thyroid not palpable. No bruits, nodes or JVD. ?Respiratory: Respiratory effort normal.  BS equal and clear bilateral without rales, rhonci, wheezing or stridor. ?Cardio: Heart sounds are normal with regular rate and rhythm and no murmurs, rubs or gallops. Peripheral pulses are normal and equal bilaterally without edema. No aortic or femoral bruits. ?Chest: symmetric with normal excursions and percussion.  ?Abdomen: Soft, with Nl bowel sounds. Nontender, no guarding, rebound, hernias, masses, or organomegaly.  ?Lymphatics: Non tender without lymphadenopathy.  ?Musculoskeletal: Full ROM all peripheral extremities, joint stability, 5/5 strength, and normal gait. ?Skin: Warm and dry without rashes, lesions, cyanosis, clubbing or  ecchymosis.  ?Neuro: Cranial nerves intact, reflexes equal bilaterally. Normal muscle tone, no cerebellar symptoms. Sensation intact.  ?Pysch: Alert and oriented X 3 with normal affect, insight and judgment appropriate.  ? ?Assessment and Plan ? ?1. Annual Preventative/Screening Exam  ? ? ?2. Essential hypertension ? ?-  EKG 12-Lead ?- [place], RETROPERITNL ABD,  LTD ?- Urinalysis, Routine w reflex microscopic ?- Microalbumin / creatinine urine ratio ?- CBC with Differential/Platelet ?- COMPLETE METABOLIC PANEL WITH GFR ?- Magnesi

## 2021-03-31 ENCOUNTER — Encounter: Payer: Self-pay | Admitting: Internal Medicine

## 2021-03-31 ENCOUNTER — Ambulatory Visit: Payer: BC Managed Care – PPO | Admitting: Internal Medicine

## 2021-03-31 ENCOUNTER — Other Ambulatory Visit: Payer: Self-pay

## 2021-03-31 VITALS — BP 134/76 | HR 73 | Temp 97.9°F | Resp 16 | Ht 66.0 in | Wt 259.2 lb

## 2021-03-31 DIAGNOSIS — R350 Frequency of micturition: Secondary | ICD-10-CM

## 2021-03-31 DIAGNOSIS — I700 Atherosclerosis of aorta: Secondary | ICD-10-CM

## 2021-03-31 DIAGNOSIS — Z1211 Encounter for screening for malignant neoplasm of colon: Secondary | ICD-10-CM

## 2021-03-31 DIAGNOSIS — R5383 Other fatigue: Secondary | ICD-10-CM

## 2021-03-31 DIAGNOSIS — Z130 Encounter for screening for diseases of the blood and blood-forming organs and certain disorders involving the immune mechanism: Secondary | ICD-10-CM

## 2021-03-31 DIAGNOSIS — N401 Enlarged prostate with lower urinary tract symptoms: Secondary | ICD-10-CM

## 2021-03-31 DIAGNOSIS — Z8546 Personal history of malignant neoplasm of prostate: Secondary | ICD-10-CM

## 2021-03-31 DIAGNOSIS — Z125 Encounter for screening for malignant neoplasm of prostate: Secondary | ICD-10-CM | POA: Diagnosis not present

## 2021-03-31 DIAGNOSIS — R7309 Other abnormal glucose: Secondary | ICD-10-CM

## 2021-03-31 DIAGNOSIS — Z131 Encounter for screening for diabetes mellitus: Secondary | ICD-10-CM | POA: Diagnosis not present

## 2021-03-31 DIAGNOSIS — Z136 Encounter for screening for cardiovascular disorders: Secondary | ICD-10-CM

## 2021-03-31 DIAGNOSIS — Z111 Encounter for screening for respiratory tuberculosis: Secondary | ICD-10-CM

## 2021-03-31 DIAGNOSIS — Z1329 Encounter for screening for other suspected endocrine disorder: Secondary | ICD-10-CM

## 2021-03-31 DIAGNOSIS — Z0000 Encounter for general adult medical examination without abnormal findings: Secondary | ICD-10-CM

## 2021-03-31 DIAGNOSIS — Z8249 Family history of ischemic heart disease and other diseases of the circulatory system: Secondary | ICD-10-CM | POA: Diagnosis not present

## 2021-03-31 DIAGNOSIS — I10 Essential (primary) hypertension: Secondary | ICD-10-CM | POA: Diagnosis not present

## 2021-03-31 DIAGNOSIS — Z13220 Encounter for screening for lipoid disorders: Secondary | ICD-10-CM

## 2021-03-31 DIAGNOSIS — Z1389 Encounter for screening for other disorder: Secondary | ICD-10-CM

## 2021-03-31 DIAGNOSIS — E559 Vitamin D deficiency, unspecified: Secondary | ICD-10-CM | POA: Diagnosis not present

## 2021-03-31 DIAGNOSIS — E349 Endocrine disorder, unspecified: Secondary | ICD-10-CM

## 2021-03-31 DIAGNOSIS — Z79899 Other long term (current) drug therapy: Secondary | ICD-10-CM | POA: Diagnosis not present

## 2021-03-31 DIAGNOSIS — E782 Mixed hyperlipidemia: Secondary | ICD-10-CM

## 2021-03-31 DIAGNOSIS — F17200 Nicotine dependence, unspecified, uncomplicated: Secondary | ICD-10-CM

## 2021-03-31 DIAGNOSIS — Z0001 Encounter for general adult medical examination with abnormal findings: Secondary | ICD-10-CM

## 2021-03-31 MED ORDER — VARENICLINE TARTRATE 1 MG PO TABS: ORAL_TABLET | ORAL | 1 refills | Status: DC

## 2021-04-01 LAB — CBC WITH DIFFERENTIAL/PLATELET
Absolute Monocytes: 983 cells/uL — ABNORMAL HIGH (ref 200–950)
Basophils Absolute: 64 cells/uL (ref 0–200)
Basophils Relative: 0.7 %
Eosinophils Absolute: 173 cells/uL (ref 15–500)
Eosinophils Relative: 1.9 %
HCT: 47.6 % (ref 38.5–50.0)
Hemoglobin: 16 g/dL (ref 13.2–17.1)
Lymphs Abs: 1947 cells/uL (ref 850–3900)
MCH: 32.1 pg (ref 27.0–33.0)
MCHC: 33.6 g/dL (ref 32.0–36.0)
MCV: 95.6 fL (ref 80.0–100.0)
MPV: 9.6 fL (ref 7.5–12.5)
Monocytes Relative: 10.8 %
Neutro Abs: 5933 cells/uL (ref 1500–7800)
Neutrophils Relative %: 65.2 %
Platelets: 210 10*3/uL (ref 140–400)
RBC: 4.98 10*6/uL (ref 4.20–5.80)
RDW: 11.7 % (ref 11.0–15.0)
Total Lymphocyte: 21.4 %
WBC: 9.1 10*3/uL (ref 3.8–10.8)

## 2021-04-01 LAB — URINALYSIS, ROUTINE W REFLEX MICROSCOPIC
Bacteria, UA: NONE SEEN /HPF
Bilirubin Urine: NEGATIVE
Glucose, UA: NEGATIVE
Hgb urine dipstick: NEGATIVE
Hyaline Cast: NONE SEEN /LPF
Ketones, ur: NEGATIVE
Leukocytes,Ua: NEGATIVE
Nitrite: NEGATIVE
RBC / HPF: NONE SEEN /HPF (ref 0–2)
Specific Gravity, Urine: 1.013 (ref 1.001–1.035)
Squamous Epithelial / HPF: NONE SEEN /HPF (ref <=5)
WBC, UA: NONE SEEN /HPF (ref 0–5)
pH: 7.5 (ref 5.0–8.0)

## 2021-04-01 LAB — LIPID PANEL
Cholesterol: 143 mg/dL (ref <200)
HDL: 54 mg/dL (ref >=40)
LDL Cholesterol (Calc): 75 mg/dL (calc)
Non-HDL Cholesterol (Calc): 89 mg/dL (calc) (ref <130)
Total CHOL/HDL Ratio: 2.6 (calc) (ref <5.0)
Triglycerides: 68 mg/dL (ref <150)

## 2021-04-01 LAB — COMPLETE METABOLIC PANEL WITH GFR
AG Ratio: 1.4 (calc) (ref 1.0–2.5)
ALT: 18 U/L (ref 9–46)
AST: 18 U/L (ref 10–35)
Albumin: 4 g/dL (ref 3.6–5.1)
Alkaline phosphatase (APISO): 66 U/L (ref 35–144)
BUN/Creatinine Ratio: 18 (calc) (ref 6–22)
BUN: 12 mg/dL (ref 7–25)
CO2: 30 mmol/L (ref 20–32)
Calcium: 8.9 mg/dL (ref 8.6–10.3)
Chloride: 100 mmol/L (ref 98–110)
Creat: 0.67 mg/dL — ABNORMAL LOW (ref 0.70–1.35)
Globulin: 2.8 g/dL (calc) (ref 1.9–3.7)
Glucose, Bld: 97 mg/dL (ref 65–99)
Potassium: 4 mmol/L (ref 3.5–5.3)
Sodium: 137 mmol/L (ref 135–146)
Total Bilirubin: 0.5 mg/dL (ref 0.2–1.2)
Total Protein: 6.8 g/dL (ref 6.1–8.1)
eGFR: 104 mL/min/{1.73_m2} (ref >=60)

## 2021-04-01 LAB — MAGNESIUM: Magnesium: 1.9 mg/dL (ref 1.5–2.5)

## 2021-04-01 LAB — IRON, TOTAL/TOTAL IRON BINDING CAP
%SAT: 29 % (calc) (ref 20–48)
Iron: 92 ug/dL (ref 50–180)
TIBC: 320 mcg/dL (calc) (ref 250–425)

## 2021-04-01 LAB — PSA: PSA: <0.04 ng/mL (ref <=4.00)

## 2021-04-01 LAB — HEMOGLOBIN A1C
Hgb A1c MFr Bld: 5.7 % of total Hgb — ABNORMAL HIGH (ref <5.7)
Mean Plasma Glucose: 117 mg/dL
eAG (mmol/L): 6.5 mmol/L

## 2021-04-01 LAB — MICROALBUMIN / CREATININE URINE RATIO
Creatinine, Urine: 68 mg/dL (ref 20–320)
Microalb Creat Ratio: 104 mcg/mg creat — ABNORMAL HIGH (ref <30)
Microalb, Ur: 7.1 mg/dL

## 2021-04-01 LAB — VITAMIN D 25 HYDROXY (VIT D DEFICIENCY, FRACTURES): Vit D, 25-Hydroxy: 38 ng/mL (ref 30–100)

## 2021-04-01 LAB — VITAMIN B12: Vitamin B-12: 423 pg/mL (ref 200–1100)

## 2021-04-01 LAB — TESTOSTERONE: Testosterone: 540 ng/dL (ref 250–827)

## 2021-04-01 LAB — TSH: TSH: 1.27 mIU/L (ref 0.40–4.50)

## 2021-04-01 LAB — INSULIN, RANDOM: Insulin: 26.4 u[IU]/mL — ABNORMAL HIGH

## 2021-04-01 NOTE — Progress Notes (Signed)
<><><><><><><><><><><><><><><><><><><><><><><><><><><><><><><><><> ?<><><><><><><><><><><><><><><><><><><><><><><><><><><><><><><><><> ?- Test results slightly outside the reference range are not unusual. ?If there is anything important, I will review this with you,  ?otherwise it is considered normal test values.  ?If you have further questions,  ?please do not hesitate to contact me at the office or via My Chart.  ?<><><><><><><><><><><><><><><><><><><><><><><><><><><><><><><><><> ?<><><><><><><><><><><><><><><><><><><><><><><><><><><><><><><><><> ? ?-  A1c = 5.7%  Blood sugar and A1c are still elevated in the borderline and  ?early or pre-diabetes range which has the same  ? ?300% increased risk for heart attack, stroke, cancer and  ? ?alzheimer- type vascular dementia as full blown diabetes.  ? ?But the good news is that diet, exercise with  ?weight loss can cure the early diabetes at this point. ?<><><><><><><><><><><><><><><><><><><><><><><><><><><><><><><><><> ?<><><><><><><><><><><><><><><><><><><><><><><><><><><><><><><><><> ? ?-  Iron level is Normal ?<><><><><><><><><><><><><><><><><><><><><><><><><><><><><><><><><> ?<><><><><><><><><><><><><><><><><><><><><><><><><><><><><><><><><> ? ?-   ?-  Vitamin B12 =   423 -    Very Low  ?(Ideal or Goal Vit B12 is between 450 - 1,100)  ? ?Low Vit B12 may be associated with Anemia , Fatigue,  ? ?Peripheral Neuropathy, Dementia, "Brain Fog", & Depression ? ?- Recommend take a sub-lingual form of Vitamin B12 tablet  ? ?1,000 to 5,000 mcg tab that you dissolve under your tongue /Daily  ? ?- Can get Baron Sane - best price at [employer] or on [employer] ?<><><><><><><><><><><><><><><><><><><><><><><><><><><><><><><><><> ?<><><><><><><><><><><><><><><><><><><><><><><><><><><><><><><><><> ? ?-  Total Chol -= 143   &  LDL Chol = 75   - Both  Excellent  ? ?- Very low risk for Heart Attack  /  Stroke ?<><><><><><><><><><><><><><><><><><><><><><><><><><><><><><><><><> ?<><><><><><><><><><><><><><><><><><><><><><><><><><><><><><><><><> ? ?-   Vitamin D = 38 - Very low  ? ?- Vitamin D goal is between 70-100.  ? ?- Please make sure that you are taking your Vitamin D as directed.  ? ?- It is very important as a natural anti-inflammatory and helping the  ?immune system protect against viral infections, like the Covid-19  ? ? ?helping hair, skin, and nails, as well as reducing stroke and  ?heart attack risk.  ? ?- It helps your bones and helps with mood. ? ?- It also decreases numerous cancer risks so please  ?take it as directed.  ? ?- Low Vit D is associated with a 200-300% higher risk for  ?CANCER  ? ?and 200-300% higher risk for HEART   ATTACK  &  STROKE.   ? ?- It is also associated with higher death rate at younger ages,  ? ?autoimmune diseases like Rheumatoid arthritis, Lupus,  ?Multiple Sclerosis.    ? ?- Also many other serious conditions, like depression, Alzheimer's ? ?Dementia, infertility, muscle aches, fatigue, fibromyalgia  ? ?- just to name a few. ?<><><><><><><><><><><><><><><><><><><><><><><><><><><><><><><><><> ?<><><><><><><><><><><><><><><><><><><><><><><><><><><><><><><><><> ?<>0<>0<>0<>0<>0<>0<>0<>0<>0<>0<>0<>0<>0<>0<>0<>0<>0<>0<>0<>0<>0<>0<> ?<>0<>0<>0<>0<>0<>0<>0<>0<>0<>0<>0<>0<>0<>0<>0<>0<>0<>0<>0<>0<>0<>0<> ? ? ? ?-  PLEASE confirm & let me know Exactly how much Vitamin D you take  ?                                                                                                     per Day  & per Week ,  ? ?- So I can determine how to adjust your dose  ? ? ? ?<>  0<>0<>0<>0<>0<>0<>0<>0<>0<>0<>0<>0<>0<>0<>0<>0<>0<>0<>0<>0<>0<>0<> ?<>0<>0<>0<>0<>0<>0<>0<>0<>0<>0<>0<>0<>0<>0<>0<>0<>0<>0<>0<>0<>0<>0<> ?<><><><><><><><><><><><><><><><><><><><><><><><><><><><><><><><><> ?<><><><><><><><><><><><><><><><><><><><><><><><><><><><><><><><><> ? ?-  All Else - CBC - Kidneys - Electrolytes - Liver -  Magnesium & Thyroid   ? ?- all  Normal / OK ? ?<><><><><><><><><><><><><><><><><><><><><><><><><><><><><><><><><> ?<><><><><><><><><><><><><><><><><><><><><><><><><><><><><><><><><> ? ? ? ? ? ? ? ? ? ? ? ? ? ? ? ? ? ? ? ? ? ? ? ? ? ? ? ? ? ? ? ? ? ? ? ? ? ? ? ? ? ? ? ? ? ? ? ? ? ? ? ? ? ? ? ?

## 2021-05-31 ENCOUNTER — Other Ambulatory Visit: Payer: Self-pay | Admitting: Internal Medicine

## 2021-05-31 DIAGNOSIS — E782 Mixed hyperlipidemia: Secondary | ICD-10-CM

## 2021-05-31 MED ORDER — ATORVASTATIN CALCIUM 20 MG PO TABS: ORAL_TABLET | ORAL | 3 refills | Status: DC

## 2021-07-01 ENCOUNTER — Encounter: Payer: Self-pay | Admitting: Nurse Practitioner

## 2021-07-01 ENCOUNTER — Ambulatory Visit: Payer: BC Managed Care – PPO | Admitting: Nurse Practitioner

## 2021-07-01 VITALS — BP 124/68 | HR 73 | Temp 97.7°F | Wt 259.8 lb

## 2021-07-01 DIAGNOSIS — E782 Mixed hyperlipidemia: Secondary | ICD-10-CM | POA: Diagnosis not present

## 2021-07-01 DIAGNOSIS — R7303 Prediabetes: Secondary | ICD-10-CM

## 2021-07-01 DIAGNOSIS — E559 Vitamin D deficiency, unspecified: Secondary | ICD-10-CM

## 2021-07-01 DIAGNOSIS — E669 Obesity, unspecified: Secondary | ICD-10-CM

## 2021-07-01 DIAGNOSIS — C61 Malignant neoplasm of prostate: Secondary | ICD-10-CM | POA: Diagnosis not present

## 2021-07-01 DIAGNOSIS — J449 Chronic obstructive pulmonary disease, unspecified: Secondary | ICD-10-CM

## 2021-07-01 DIAGNOSIS — I10 Essential (primary) hypertension: Secondary | ICD-10-CM

## 2021-07-01 DIAGNOSIS — R239 Unspecified skin changes: Secondary | ICD-10-CM

## 2021-07-01 DIAGNOSIS — E349 Endocrine disorder, unspecified: Secondary | ICD-10-CM

## 2021-07-01 DIAGNOSIS — F17200 Nicotine dependence, unspecified, uncomplicated: Secondary | ICD-10-CM

## 2021-07-01 NOTE — Patient Instructions (Signed)
Smoking Cessation You will learn methods to help you quit smoking and how quitting can help prevent a variety of health problems and improve your health. To view the content, go to this web address: [email or web address]  This video will expire on: 03/31/2023. If you need access to this video following this date, please reach out to the healthcare provider who assigned it to you. This information is not intended to replace advice given to you by your health care provider. Make sure you discuss any questions you have with your health care provider. Elsevier Patient Education  2023 [employer] Inc.  

## 2021-07-01 NOTE — Progress Notes (Incomplete)
FOLLOW UP 3 MONTH   Assessment and Plan:   Willys was seen today for follow-up.  Diagnoses and all orders for this visit:  Essential hypertension -     CBC with Differential/Platelet - continue medications, DASH diet, exercise and monitor at home. Call if greater than 130/80.   Go to the ER if any chest pain, shortness of breath, nausea, dizziness, severe HA, changes vision/speech   Hyperlipidemia, mixed -     COMPLETE METABOLIC PANEL WITH GFR -     Lipid panel -     TSH Continue medication, low saturated fat diet and exercise  Abnormal glucose -     Hemoglobin A1c Continue diet and exercise  Class 2 severe obesity due to excess calories with serious comorbidity and body mass index (BMI) of 39.0 to 39.9 in adult [facility])  Long discussion on diet and exercise  Stress importance of fresh fruits/vegetables and fibers. Limit processed carbs, sugars and saturated fats  Vitamin D deficiency -     VITAMIN D 25 Hydroxy (Vit-D Deficiency, Fractures) Pt is not currently on supplementation  Smoker -     CT CHEST LUNG CA SCREEN LOW DOSE W/O CM; Future Strongly encouraged discontinuation of smoking. Pt counseled on risks  Chronic obstructive pulmonary disease, unspecified COPD type (HCC) -     albuterol (VENTOLIN HFA) 108 (90 Base) MCG/ACT inhaler; Inhale 2 puffs into the lungs every 6 (six) hours as needed for wheezing or shortness of breath. Continue Breztri daily and Albuterol PRN  Screening for hematuria or proteinuria -     Urinalysis, Routine w reflex microscopic -     Microalbumin / creatinine urine ratio  Medication management -     CBC with Differential/Platelet -     COMPLETE METABOLIC PANEL WITH GFR -     Lipid panel -     TSH -     Hemoglobin A1c -     VITAMIN D 25 Hydroxy (Vit-D Deficiency, Fractures) -     Magnesium -     Urinalysis, Routine w reflex microscopic -     Microalbumin / creatinine urine ratio   Skin Tag Area cleansed with alcohol and snipped with  scissors, silver nitrate applied and covered with bandaid.  Pt advised to keep area clean and dry.    Continue diet and meds as discussed. Further disposition pending results of labs. Discussed med's effects and SE's.  Patient agrees with plan of care and opportunity to ask questions/voice concerns. Over 30 minutes of face to face interview, chart review, face to face interview, exam, counseling, and critical decision making was performed.   Future Appointments  Date Time Provider [place]  10/05/2021  4:00 PM Unk [name], MD [facility] None  04/06/2022 10:00 AM Unk [name], MD [facility] None    ----------------------------------------------------------------------------------------------------------------------  HPI 66 y.o. male  presents for 3 month follow up on HTN, HLD, abnormal glucose with history of pre-diabetes, weight and vitamin D deficiency.   Reports overall he is doing well.  He still has mild pain on bottom of left foot.  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   BMI is Body mass index is 41.93 kg/m., he has not been working on diet and exercise. Has not been exercising. Wt Readings from Last 3 Encounters:  07/01/21 259 lb 12.8 oz (117.8 kg)  03/31/21 259  lb 3.2 oz (117.6 kg)  10/23/20 259 lb 6.4 oz (117.7 kg)   Pt has skin tag on right cheek that he wants removed.    His blood pressure has been controlled at home, today their BP is BP: 124/68  He does not workout. He denies any cardiac symptoms, chest pains, palpitations, shortness of breath, dizziness or lower extremity edema.      He is on cholesterol medication Atorvastatin and denies myalgias.   His cholesterol is at goal. The cholesterol last visit was:   Lab Results  Component Value Date   CHOL 143 03/31/2021   HDL 54 03/31/2021   LDLCALC 75 03/31/2021   TRIG 68  03/31/2021   CHOLHDL 2.6 03/31/2021    He has not been working on diet and exercise for prediabetes, and denies polydipsia, polyuria, visual disturbances, vomiting and weight loss. Last A1C in the office was:  Lab Results  Component Value Date   HGBA1C 5.7 (H) 03/31/2021     Patient does not have history of CKD.  Their last GFR was:  Lab Results  Component Value Date   GFRNONAA 101 03/21/2020     Patient is not on Vitamin D supplement for deficiency. Lab Results  Component Value Date   VD25OH 38 03/31/2021        Current Medications:  Current Outpatient Medications on File Prior to Visit  Medication Sig  . albuterol (VENTOLIN HFA) 108 (90 Base) MCG/ACT inhaler Inhale 2 puffs into the lungs every 6 (six) hours as needed for wheezing or shortness of breath.  Marland Kitchen aspirin EC 81 MG tablet Take 81 mg by mouth daily.  Marland Kitchen atorvastatin (LIPITOR) 20 MG tablet Take  1 tablet  Daily  for Cholesterol                                            /                     TAKE                          BY                       MOUTH  . benazepril-hydrochlorthiazide (LOTENSIN HCT) 20-25 MG tablet Take  1 tablet  Daily  for BP & Fluid Retention /Ankle Swelling  . Budeson-Glycopyrrol-Formoterol (BREZTRI AEROSPHERE) 160-9-4.8 MCG/ACT AERO Use  2 Inhalations  15 minutes  Apart  2 x /day (every 12 hours)  . Cholecalciferol (VITAMIN D) 125 MCG (5000 UT) CAPS Take by mouth.  . Magnesium 500 MG TABS Take 500 mg by mouth daily.  . varenicline (CHANTIX CONTINUING MONTH PAK) 1 MG tablet Take 1/2 to 1 tablet 2 x /day for Smoking Cessation (Patient not taking: Reported on 07/01/2021)   No current facility-administered medications on file prior to visit.    Allergies:  Allergies  Allergen Reactions  . Codeine Other (See Comments)    REACTION: Itching  . Isovue [Iopamidol] Hives    Pt. Developed 1 hive after injection; Dr. Kris Hartmann spoke with patient;recommends 13 hr prep in the future      Medical History:   Past Medical History:  Diagnosis Date  . Arthritis    hands/knees  . 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 COPD (chronic obstructive pulmonary disease) (  HCC)   . Elevated hemoglobin A1c   . Elevated PSA 03/16/2016  . Hypertension   . Personal history of colonic adenomas 12/28/2007  . Vitamin D deficiency      Family history- Reviewed and unchanged Family History  Problem Relation Age of Onset  . Cancer Mother        lung  . Lung disease Neg Hx   . Colon cancer Neg Hx   . Rectal cancer Neg Hx   . Stomach cancer Neg Hx      Social history- Reviewed and unchanged Social History   Tobacco Use  . Smoking status: Every Day    Packs/day: 0.50    Years: 40.00    Total pack years: 20.00    Types: Cigarettes  . Smokeless tobacco: Never  . Tobacco comments:    has decreased smoking significantly with goal of quitting  Vaping Use  . Vaping Use: Never used  Substance Use Topics  . Alcohol use: Yes    Alcohol/week: 4.0 standard drinks of alcohol    Types: 4 Cans of beer per week    Comment: occasionally  . Drug use: No     Names of Other Physician/Practitioners you currently use: 1. [facility] here for primary care 2. Eye Exam: Due for 2021 3. Dental Exam UTD 2021   Patient Care Team: Unk [name], MD as PCP - General (Internal Medicine) Wyatt Portela, MD as Consulting Physician (Oncology) Tyler Pita, MD as Consulting Physician (Radiation Oncology)    Screening Tests: Immunization History  Administered Date(s) Administered  . Influenza,inj,Quad PF,6+ Mos 12/27/2017, 12/15/2018  . Janssen (J&J) SARS-COV-2 Vaccination 03/27/2019  . PPD Test 03/21/2020, 03/31/2021  . Pneumococcal Polysaccharide-23 01/11/1997, 12/27/2017  . Td 01/12/2004  . Tdap 07/09/2016  . Zoster, Live 01/12/2008     Preventative Care: Last colonoscopy: 04/2019 Hep C screening (1945-1965): 12/05/2012 Negative COPD: PFT?   Review of  Systems  Constitutional:  Negative for chills, fever and weight loss.  HENT:  Negative for congestion and hearing loss.   Eyes:  Negative for blurred vision and double vision.  Respiratory:  Negative for cough and shortness of breath.   Cardiovascular:  Negative for chest pain, palpitations, orthopnea and leg swelling.  Gastrointestinal:  Negative for abdominal pain, constipation, diarrhea, heartburn, nausea and vomiting.  Genitourinary:  Negative for dysuria.  Musculoskeletal:  Negative for falls, joint pain and myalgias.  Skin:  Negative for rash.       Skin tag right cheek  Neurological:  Negative for dizziness, tingling, tremors, loss of consciousness and headaches.  Endo/Heme/Allergies:  Does not bruise/bleed easily.  Psychiatric/Behavioral:  Negative for depression, memory loss and suicidal ideas.        Physical Exam: BP 124/68   Pulse 73   Temp 97.7 F (36.5 C)   Wt 259 lb 12.8 oz (117.8 kg)   SpO2 91%   BMI 41.93 kg/m  Wt Readings from Last 3 Encounters:  07/01/21 259 lb 12.8 oz (117.8 kg)  03/31/21 259 lb 3.2 oz (117.6 kg)  10/23/20 259 lb 6.4 oz (117.7 kg)   General Appearance: Well nourished, in no apparent distress. Eyes: PERRLA, EOMs, conjunctiva no swelling or erythema Sinuses: No Frontal/maxillary tenderness ENT/Mouth: Ext aud canals clear, TMs without erythema, bulging. No erythema, swelling, or exudate on post pharynx.  Tonsils not swollen or erythematous. Hearing normal.  Neck: Supple, thyroid normal.  Respiratory: Respiratory effort normal, BS equal bilaterally without rales, rhonchi, wheezing or stridor.  Cardio: RRR with no MRGs. Brisk peripheral pulses without edema.  Abdomen: Soft, + BS.  Non tender, no guarding, rebound, hernias, masses. Lymphatics: Non tender without lymphadenopathy.  Musculoskeletal: Full ROM, 5/5 strength, Normal gait Skin: Warm, dry . Skin tag on right cheek. Area cleansed with alcohol snipped with scissors and silver nitrate  applied Neuro: Cranial nerves intact. No cerebellar symptoms.  Psych: Awake and oriented X 3, normal affect, Insight and Judgment appropriate.   S: The patient complains of symptomatic skin tags on the right cheek. These are irritated by clothing, jewelry and rubbing.  O: Patient appears well. One benign skin tags are noted on the right cheek.   A: Skin tags   P: Skin tags are snipped off using Alcohol for cleansing and sterile iris scissors. Local anesthesia was not used. These pathognomonic lesions are not sent for pathology.   Magda Bernheim ANP-C  [facility]  07/01/2021

## 2021-08-07 ENCOUNTER — Other Ambulatory Visit: Payer: Self-pay | Admitting: Internal Medicine

## 2021-08-07 MED ORDER — BREZTRI AEROSPHERE 160-9-4.8 MCG/ACT IN AERO: INHALATION_SPRAY | RESPIRATORY_TRACT | 3 refills | Status: DC

## 2021-10-04 NOTE — Progress Notes (Signed)
Future Appointments  Date Time Provider Department  10/05/2021  4:00 PM Unk [name], MD [facility]  04/06/2022                cpe 10:00 AM Unk [name], MD [facility]        This very nice 66 y.o. WWM presents for 6 month follow up with HTN, HLD, COPD, Pre-Diabetes and Vitamin D Deficiency. In 2018, patient  underwent a Robotic Prostatectomy for Ca .       Patient is treated for HTN (2004) & BP has been controlled at home. Today's BP is  at goal - 136/80 .  Patient has had no complaints of any cardiac type chest pain, palpitations, dyspnea Vertell Limber /PND, dizziness, claudication or dependent edema.      Patient has COPD &  reports improvement with Breztri.       Hyperlipidemia is  controlled with diet & Atorvastatin. Patient denies myalgias or other med SE's. Last Lipids were at goal :   Lab Results  Component Value Date   CHOL 143 03/31/2021   HDL 54 03/31/2021   LDLCALC 75 03/31/2021   TRIG 68 03/31/2021   CHOLHDL 2.6 03/31/2021        Also, the patient has history of Morbid Obesity (BMI 36+) and  PreDiabetes (A1c 5.7%/2009 & 5.8%/2012) and has had no symptoms of reactive hypoglycemia, diabetic polys, paresthesias or visual blurring.  Last A1c was near goal :  Lab Results  Component Value Date   HGBA1C 5.7 (H) 03/31/2021        Further, the patient also has history of Vitamin D Deficiency ("21"/2008) and supplements vitamin D without any suspected side-effects. Last vitamin D was not  at goal (70-100) :   Lab Results  Component Value Date   VD25OH 38 03/31/2021     Current Outpatient Medications on File Prior to Visit  Medication Sig   albuterol (VENTOLIN HFA) 108 (90 Base) MCG/ACT inhaler Inhale 2 puffs into the lungs every 6 (six) hours as needed for wheezing or shortness of breath.   aspirin EC 81 MG tablet Take  daily.   atorvastatin (LIPITOR) 20 MG tablet Take  1 tablet  Daily     benazepril-hctz20-25 MG tablet Take  1 tablet  Daily    BREZTRI  160-9-4.8 Use  2 Inhalations  Apart  2 x /day (every 12 hours)   VITAMIN D 5000 u Take daily   Magnesium 500 MG TABS Take daily.     Allergies  Allergen Reactions   Codeine Other (See Comments)    REACTION: Itching   Isovue [Iopamidol] Hives    Pt. Developed 1 hive after injection;  Dr. 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   COPD (chronic obstructive pulmonary disease) (HCC)    Elevated hemoglobin A1c    Elevated PSA 03/16/2016   Hypertension    Personal history of colonic adenomas 12/28/2007   Vitamin D deficiency    Immunization History  Administered Date(s) Administered   Influenza,inj,Quad  12/27/2017, 12/15/2018   Janssen (J&J) SARS-COV-2 Vacc 03/27/2019   PPD Test 03/21/2020, 03/31/2021   Pneumococcal - 23 01/11/1997, 12/27/2017   Td  01/12/2004   Tdap 07/09/2016   Zoster, Live 01/12/2008   Past Surgical History:  Procedure Laterality Date   COLONOSCOPY  2009   CG  hems, TAs   LYMPHADENECTOMY N/A 06/28/2016   Procedure: LYMPHADENECTOMY;  Surgeon: Raynelle Bring, MD;  Location: [facility];  Service: Urology;  Laterality: N/A;   PROSTATE BIOPSY  03/16/2016   Surgical Pathology Gross and Microscopic Examination for Prostate Needle   ROBOT ASSISTED LAPAROSCOPIC RADICAL PROSTATECTOMY N/A 06/28/2016   Procedure: XI ROBOTIC ASSISTED LAPAROSCOPIC RADICAL PROSTATECTOMY LEVEL 2 EXCISION OF PENILE LESION;  Surgeon: Raynelle Bring, MD;  Location: [facility];  Service: Urology;  Laterality: N/A;   FHx:    Reviewed / unchanged  SHx:    Reviewed / unchanged   Systems Review:  Constitutional: Denies fever, chills, wt changes, headaches, insomnia, fatigue, night sweats, change in appetite. Eyes: Denies redness, blurred vision, diplopia, discharge, itchy, watery eyes.  ENT: Denies discharge, congestion, post nasal  drip, epistaxis, sore throat, earache, hearing loss, dental pain, tinnitus, vertigo, sinus pain, snoring.  CV: Denies chest pain, palpitations, irregular heartbeat, syncope, dyspnea, diaphoresis, orthopnea, PND, claudication or edema. Respiratory: denies cough, dyspnea, DOE, pleurisy, hoarseness, laryngitis, wheezing.  Gastrointestinal: Denies dysphagia, odynophagia, heartburn, reflux, water brash, abdominal pain or cramps, nausea, vomiting, bloating, diarrhea, constipation, hematemesis, melena, hematochezia  or hemorrhoids. Genitourinary: Denies dysuria, frequency, urgency, nocturia, hesitancy, discharge, hematuria or flank pain. Musculoskeletal: Denies arthralgias, myalgias, stiffness, jt. swelling, pain, limping or strain/sprain.  Skin: Denies pruritus, rash, hives, warts, acne, eczema or change in skin lesion(s). Neuro: No weakness, tremor, incoordination, spasms, paresthesia or pain. Psychiatric: Denies confusion, memory loss or sensory loss. Endo: Denies change in weight, skin or hair change.  Heme/Lymph: No excessive bleeding, bruising or enlarged lymph nodes.  Physical Exam  BP 136/80   Pulse 77   Temp 97.8 F (36.6 C)   Resp 16   Ht '5\' 6"'$  (1.676 m)   Wt 255 lb 3.2 oz (115.8 kg)   SpO2 98%   BMI 41.19 kg/m   Appears  over nourished and in no distress.  Eyes: PERRLA, EOMs, conjunctiva no swelling or erythema. Sinuses: No frontal/maxillary tenderness ENT/Mouth: EAC's clear, TM's nl w/o erythema, bulging. Nares clear w/o erythema, swelling, exudates. Oropharynx clear without erythema or exudates. Oral hygiene is good. Tongue normal, non obstructing. Hearing intact.  Neck: Supple. Thyroid not palpable. Car 2+/2+ without bruits, nodes or JVD. Chest: Respirations nl with BS clear & equal w/o rales, rhonchi, wheezing or stridor.  Cor: Heart sounds normal w/ regular rate and rhythm without sig. murmurs, gallops, clicks or rubs. Peripheral pulses normal and equal  without edema.   Abdomen: Soft & bowel sounds normal. Non-tender w/o guarding, rebound, hernias, masses or organomegaly.  Lymphatics: Unremarkable.  Musculoskeletal: Full ROM all peripheral extremities, joint stability, 5/5 strength and normal gait.  Skin: Warm, dry without exposed rashes, lesions or ecchymosis apparent.  Neuro: Cranial nerves intact, reflexes equal bilaterally. Sensory-motor testing grossly intact. Tendon reflexes grossly intact.  Pysch: Alert & oriented x 3.  Insight and judgement nl & appropriate. No ideations.  Assessment and Plan:  1. Essential hypertension  - Continue medication, monitor blood pressure at home.  - Continue DASH diet.  Reminder to go to the ER if any CP,  SOB, nausea, dizziness, severe HA, changes vision/speech.  - COMPLETE METABOLIC PANEL WITH GFR  2. Hyperlipidemia, mixed  - Continue diet/meds, exercise,& lifestyle modifications.  - Continue monitor periodic cholesterol/liver & renal functions   3. Prediabetes  -  Continue diet, exercise, lifestyle modifications.  - Monitor appropriate labs.  - COMPLETE METABOLIC PANEL WITH GFR  4. Vitamin D deficiency  - Continue supplementation.  5. Smoker  - encouraged smoking cessation  6. Medication management  - COMPLETE METABOLIC PANEL WITH GFR       Discussed  regular exercise, BP monitoring, weight control to achieve/maintain BMI less than 25 and discussed med and SE's. Recommended labs to assess and monitor clinical status with further disposition pending results of labs. Over 30 minutes of exam, counseling, chart review was performed.

## 2021-10-04 NOTE — Patient Instructions (Signed)

## 2021-10-05 ENCOUNTER — Encounter: Payer: Self-pay | Admitting: Internal Medicine

## 2021-10-05 ENCOUNTER — Ambulatory Visit: Payer: BC Managed Care – PPO | Admitting: Internal Medicine

## 2021-10-05 VITALS — BP 136/80 | HR 77 | Temp 97.8°F | Resp 16 | Ht 66.0 in | Wt 255.2 lb

## 2021-10-05 DIAGNOSIS — E559 Vitamin D deficiency, unspecified: Secondary | ICD-10-CM

## 2021-10-05 DIAGNOSIS — E782 Mixed hyperlipidemia: Secondary | ICD-10-CM | POA: Diagnosis not present

## 2021-10-05 DIAGNOSIS — I10 Essential (primary) hypertension: Secondary | ICD-10-CM | POA: Diagnosis not present

## 2021-10-05 DIAGNOSIS — J449 Chronic obstructive pulmonary disease, unspecified: Secondary | ICD-10-CM

## 2021-10-05 DIAGNOSIS — Z79899 Other long term (current) drug therapy: Secondary | ICD-10-CM | POA: Diagnosis not present

## 2021-10-05 DIAGNOSIS — F17200 Nicotine dependence, unspecified, uncomplicated: Secondary | ICD-10-CM

## 2021-10-05 DIAGNOSIS — Z6839 Body mass index (BMI) 39.0-39.9, adult: Secondary | ICD-10-CM

## 2021-10-05 MED ORDER — VARENICLINE TARTRATE 1 MG PO TABS
0.5000 mg | ORAL_TABLET | Freq: Two times a day (BID) | ORAL | 1 refills | Status: DC
  Filled 2021-10-05: qty 180, 90d supply, fill #0

## 2021-10-06 ENCOUNTER — Other Ambulatory Visit (HOSPITAL_COMMUNITY): Payer: Self-pay

## 2021-10-06 LAB — CBC WITH DIFFERENTIAL/PLATELET
Absolute Monocytes: 938 cells/uL (ref 200–950)
Basophils Absolute: 71 cells/uL (ref 0–200)
Basophils Relative: 0.7 %
Eosinophils Absolute: 224 cells/uL (ref 15–500)
Eosinophils Relative: 2.2 %
HCT: 48.3 % (ref 38.5–50.0)
Hemoglobin: 16.6 g/dL (ref 13.2–17.1)
Lymphs Abs: 2723 cells/uL (ref 850–3900)
MCH: 32.9 pg (ref 27.0–33.0)
MCHC: 34.4 g/dL (ref 32.0–36.0)
MCV: 95.6 fL (ref 80.0–100.0)
MPV: 9.9 fL (ref 7.5–12.5)
Monocytes Relative: 9.2 %
Neutro Abs: 6242 cells/uL (ref 1500–7800)
Neutrophils Relative %: 61.2 %
Platelets: 211 10*3/uL (ref 140–400)
RBC: 5.05 10*6/uL (ref 4.20–5.80)
RDW: 11.8 % (ref 11.0–15.0)
Total Lymphocyte: 26.7 %
WBC: 10.2 10*3/uL (ref 3.8–10.8)

## 2021-10-06 LAB — COMPLETE METABOLIC PANEL WITH GFR
AG Ratio: 1.6 (calc) (ref 1.0–2.5)
ALT: 20 U/L (ref 9–46)
AST: 16 U/L (ref 10–35)
Albumin: 4.3 g/dL (ref 3.6–5.1)
Alkaline phosphatase (APISO): 65 U/L (ref 35–144)
BUN: 19 mg/dL (ref 7–25)
CO2: 29 mmol/L (ref 20–32)
Calcium: 9.6 mg/dL (ref 8.6–10.3)
Chloride: 99 mmol/L (ref 98–110)
Creat: 0.77 mg/dL (ref 0.70–1.35)
Globulin: 2.7 g/dL (calc) (ref 1.9–3.7)
Glucose, Bld: 99 mg/dL (ref 65–99)
Potassium: 4 mmol/L (ref 3.5–5.3)
Sodium: 136 mmol/L (ref 135–146)
Total Bilirubin: 0.4 mg/dL (ref 0.2–1.2)
Total Protein: 7 g/dL (ref 6.1–8.1)
eGFR: 100 mL/min/{1.73_m2} (ref >=60)

## 2021-10-06 LAB — LIPID PANEL
Cholesterol: 154 mg/dL (ref <200)
HDL: 60 mg/dL (ref >=40)
LDL Cholesterol (Calc): 73 mg/dL (calc)
Non-HDL Cholesterol (Calc): 94 mg/dL (calc) (ref <130)
Total CHOL/HDL Ratio: 2.6 (calc) (ref <5.0)
Triglycerides: 130 mg/dL (ref <150)

## 2021-10-06 LAB — MAGNESIUM: Magnesium: 2 mg/dL (ref 1.5–2.5)

## 2021-10-06 LAB — HEMOGLOBIN A1C
Hgb A1c MFr Bld: 5.6 % of total Hgb (ref <5.7)
Mean Plasma Glucose: 114 mg/dL
eAG (mmol/L): 6.3 mmol/L

## 2021-10-06 LAB — VITAMIN D 25 HYDROXY (VIT D DEFICIENCY, FRACTURES): Vit D, 25-Hydroxy: 63 ng/mL (ref 30–100)

## 2021-10-06 LAB — TSH: TSH: 1.47 mIU/L (ref 0.40–4.50)

## 2021-10-06 LAB — INSULIN, RANDOM: Insulin: 38.3 u[IU]/mL — ABNORMAL HIGH

## 2021-10-06 NOTE — Progress Notes (Signed)
<><><><><><><><><><><><><><><><><><><><><><><><><><><><><><><><><> <><><><><><><><><><><><><><><><><><><><><><><><><><><><><><><><><>  -   Total Chol =  154   &  LDL Chol = 73   - Both  Excellent   - Very low risk for Heart Attack  / Stroke <><><><><><><><><><><><><><><><><><><><><><><><><><><><><><><><><> <><><><><><><><><><><><><><><><><><><><><><><><><><><><><><><><><>  - A1c is Normal now   - No Diabetes  - Great   ! <><><><><><><><><><><><><><><><><><><><><><><><><><><><><><><><><> <><><><><><><><><><><><><><><><><><><><><><><><><><><><><><><><><>  -  Vitamin D = 63  -  Excellent - Pleas keep dose same  <><><><><><><><><><><><><><><><><><><><><><><><><><><><><><><><><> <><><><><><><><><><><><><><><><><><><><><><><><><><><><><><><><><>  - All Else - CBC - Kidneys - Electrolytes - Liver - Magnesium & Thyroid    - all  Normal / OK <><><><><><><><><><><><><><><><><><><><><><><><><><><><><><><><><> <><><><><><><><><><><><><><><><><><><><><><><><><><><><><><><><><>  - Keep up the Great Work  !  <><><><><><><><><><><><><><><><><><><><><><><><><><><><><><><><><> <><><><><><><><><><><><><><><><><><><><><><><><><><><><><><><><><>                 <><><><><><><><><><><><><><><><><><><><><><><><><><><><><><><><><> <><><><><><><><><><><><><><><><><><><><><><><><><><><><><><><><><>  -

## 2021-10-19 DIAGNOSIS — L814 Other melanin hyperpigmentation: Secondary | ICD-10-CM | POA: Diagnosis not present

## 2021-10-19 DIAGNOSIS — I872 Venous insufficiency (chronic) (peripheral): Secondary | ICD-10-CM | POA: Diagnosis not present

## 2021-10-19 DIAGNOSIS — L821 Other seborrheic keratosis: Secondary | ICD-10-CM | POA: Diagnosis not present

## 2021-10-19 DIAGNOSIS — C44629 Squamous cell carcinoma of skin of left upper limb, including shoulder: Secondary | ICD-10-CM | POA: Diagnosis not present

## 2021-10-19 DIAGNOSIS — D485 Neoplasm of uncertain behavior of skin: Secondary | ICD-10-CM | POA: Diagnosis not present

## 2021-10-19 DIAGNOSIS — D1801 Hemangioma of skin and subcutaneous tissue: Secondary | ICD-10-CM | POA: Diagnosis not present

## 2021-10-31 ENCOUNTER — Other Ambulatory Visit: Payer: Self-pay | Admitting: Nurse Practitioner

## 2021-10-31 DIAGNOSIS — J449 Chronic obstructive pulmonary disease, unspecified: Secondary | ICD-10-CM

## 2021-12-09 ENCOUNTER — Other Ambulatory Visit: Payer: Self-pay | Admitting: Internal Medicine

## 2021-12-09 DIAGNOSIS — I10 Essential (primary) hypertension: Secondary | ICD-10-CM

## 2022-01-07 ENCOUNTER — Encounter: Payer: Self-pay | Admitting: Nurse Practitioner

## 2022-01-07 ENCOUNTER — Ambulatory Visit: Payer: BC Managed Care – PPO | Admitting: Nurse Practitioner

## 2022-01-07 VITALS — BP 128/72 | HR 69 | Temp 97.3°F | Ht 66.0 in | Wt 262.2 lb

## 2022-01-07 DIAGNOSIS — E559 Vitamin D deficiency, unspecified: Secondary | ICD-10-CM

## 2022-01-07 DIAGNOSIS — R7303 Prediabetes: Secondary | ICD-10-CM

## 2022-01-07 DIAGNOSIS — C61 Malignant neoplasm of prostate: Secondary | ICD-10-CM

## 2022-01-07 DIAGNOSIS — I10 Essential (primary) hypertension: Secondary | ICD-10-CM

## 2022-01-07 DIAGNOSIS — E782 Mixed hyperlipidemia: Secondary | ICD-10-CM

## 2022-01-07 DIAGNOSIS — Z23 Encounter for immunization: Secondary | ICD-10-CM

## 2022-01-07 DIAGNOSIS — J449 Chronic obstructive pulmonary disease, unspecified: Secondary | ICD-10-CM | POA: Diagnosis not present

## 2022-01-07 DIAGNOSIS — E669 Obesity, unspecified: Secondary | ICD-10-CM

## 2022-01-07 DIAGNOSIS — R239 Unspecified skin changes: Secondary | ICD-10-CM

## 2022-01-07 DIAGNOSIS — E349 Endocrine disorder, unspecified: Secondary | ICD-10-CM

## 2022-01-07 NOTE — Patient Instructions (Signed)
Healthy Living: Tips to Quit Tobacco There are many benefits of not using tobacco. You will learn tips you can use to stop using tobacco. To view the content, go to this web address: https://pe.elsevier.com/[email or web address]  This video will expire on: 09/16/2023. If you need access to this video following this date, please reach out to the healthcare provider who assigned it to you. This information is not intended to replace advice given to you by your health care provider. Make sure you discuss any questions you have with your health care provider. [employer] Patient Education  [employer].

## 2022-01-07 NOTE — Progress Notes (Signed)
FOLLOW UP 3 MONTH  Assessment and Plan:   Robert [name] was seen today for follow-up.  Diagnoses and all orders for this visit:  1. Essential hypertension Controlled  Continue medications;  Discussed DASH (Dietary Approaches to Stop Hypertension) DASH diet is lower in sodium than a typical American diet. Cut back on foods that are high in saturated fat, cholesterol, and trans fats. Eat more whole-grain foods, fish, poultry, and nuts Remain active and exercise as tolerated daily.  Monitor BP at home-Call if greater than 130/80.    2. COPD, severe (HCC)/Smoker Controlled Continue inhalers Smoking cessation discussed.  3. Malignant neoplasm of prostate ([place]) Symptoms controlled Continue to monitor   4. Hyperlipidemia, mixed Controlled Continue medications;  Discussed lifestyle modifications. Recommended diet heavy in fruits and veggies, omega 3's. Decrease consumption of animal meats, cheeses, and dairy products. Remain active and exercise as tolerated. Continue to monitor.   5. Vitamin D deficiency Above normal but not at goal. Continue supplement  6. Testosterone deficiency Continue to monitor  7. Obesity (BMI 35.55) Discussed appropriate BMI Goal of losing 1 lb per month. Diet modification. Physical activity. Encouraged/praised to build confidence.  9. Prediabetes Education: Reviewed 'ABCs' of diabetes management  Discussed goals to be met and/or maintained include A1C (<7) Blood pressure (<130/80) Cholesterol (LDL <70) Continue Eye Exam yearly  Continue Dental Exam Q6 mo Discussed dietary recommendations Discussed Physical Activity recommendations Check A1C   10.  Skin change Requested dermatology report for review of skin cancer type Continue to follow with Dermatology  11.  Need for flu vaccine Administered  Patient tolerate dwell Continue universal precautions.   Labs obtained Q6 mo.  Review of last blood work from 3 mo ago, WNL.   Notify  office for further evaluation and treatment, questions or concerns if any reported s/s fail to improve.   The patient was advised to call back or seek an in-person evaluation if any symptoms worsen or if the condition fails to improve as anticipated.   Further disposition pending results of labs. Discussed med's effects and SE's.    I discussed the assessment and treatment plan with the patient. The patient was provided an opportunity to ask questions and all were answered. The patient agreed with the plan and demonstrated an understanding of the instructions.  Discussed med's effects and SE's. Screening labs and tests as requested with regular follow-up as recommended.  I provided 20 minutes of face-to-face time during this encounter including counseling, chart review, and critical decision making was preformed.   Future Appointments  Date Time Provider [place]  04/06/2022 10:00 AM Unk [name], MD [facility] None    ----------------------------------------------------------------------------------------------------------------------  HPI 66 y.o. male  presents for 3 month follow up on HTN, HLD, abnormal glucose with history of pre-diabetes, weight and vitamin D deficiency.   Reports overall he is doing well.    He had a concern for a skin change area on his right hand and was referred to Dermatology.  Removed area.  Patient reports receiving a report in the mail this week that has diagnosed him with "some type of cancer."     BMI is Body mass index is 42.32 kg/m., he has not been working on diet and exercise.  Wt Readings from Last 3 Encounters:  01/07/22 262 lb 3.2 oz (118.9 kg)  10/05/21 255 lb 3.2 oz (115.8 kg)  07/01/21 259 lb 12.8 oz (117.8 kg)    His blood pressure has been controlled at home, today their BP is BP:  128/72  He does not workout. He denies any cardiac symptoms, chest pains, palpitations, shortness of breath, dizziness or lower extremity edema.       He is on cholesterol medication Atorvastatin and denies myalgias.   His cholesterol is at goal. The cholesterol last visit was:   Lab Results  Component Value Date   CHOL 154 10/05/2021   HDL 60 10/05/2021   LDLCALC 73 10/05/2021   TRIG 130 10/05/2021   CHOLHDL 2.6 10/05/2021    He has not been working on diet and exercise for prediabetes, and denies polydipsia, polyuria, visual disturbances, vomiting and weight loss. Last A1C in the office was:  Lab Results  Component Value Date   HGBA1C 5.6 10/05/2021     Patient does not have history of CKD.  Their last GFR was:  Lab Results  Component Value Date   GFRNONAA 101 03/21/2020     Patient is not on Vitamin D supplement for deficiency. Lab Results  Component Value Date   VD25OH 63 10/05/2021        Current Medications:  Current Outpatient Medications on File Prior to Visit  Medication Sig   aspirin EC 81 MG tablet Take 81 mg by mouth daily.   atorvastatin (LIPITOR) 20 MG tablet Take  1 tablet  Daily  for Cholesterol                                            /                     TAKE                          BY                       MOUTH   benazepril-hydrochlorthiazide (LOTENSIN HCT) 20-25 MG tablet TAKE ONE TABLET BY MOUTH DAILY FOR BLOOD PRESSURE AND FLUID RETENTION/ANKLE SWELLING   Budeson-Glycopyrrol-Formoterol (BREZTRI AEROSPHERE) 160-9-4.8 MCG/ACT AERO Use  2 Inhalations  15 minutes  Apart  2 x /day (every 12 hours)   Cholecalciferol (VITAMIN D) 125 MCG (5000 UT) CAPS Take by mouth.   Magnesium 500 MG TABS Take 500 mg by mouth daily.   varenicline (CHANTIX CONTINUING MONTH PAK) 1 MG tablet Take 0.5-1 tablets (0.5-1 mg total) by mouth 2 (two) times daily for smoking cessation   VENTOLIN HFA 108 (90 Base) MCG/ACT inhaler INHALE TWO PUFFS BY MOUTH INTO LUNGS EVERY 6 HOURS AS NEEDED FOR WHEEZING/SHORTNESS OF BREATH   No current facility-administered medications on file prior to visit.    Allergies:  Allergies   Allergen Reactions   Codeine Other (See Comments)    REACTION: Itching   Isovue [Iopamidol] Hives    Pt. Developed 1 hive after injection; Dr. 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   COPD (chronic obstructive pulmonary disease) (HCC)    Elevated hemoglobin A1c    Elevated PSA 03/16/2016   Hypertension    Personal history of colonic adenomas 12/28/2007   Vitamin D deficiency      Family history- Reviewed and unchanged Family History  Problem Relation Age of Onset  Cancer Mother        lung   Lung disease Neg Hx    Colon cancer Neg Hx    Rectal cancer Neg Hx    Stomach cancer Neg Hx      Social history- Reviewed and unchanged Social History   Tobacco Use   Smoking status: Every Day    Packs/day: 0.50    Years: 40.00    Total pack years: 20.00    Types: Cigarettes   Smokeless tobacco: Never   Tobacco comments:    has decreased smoking significantly with goal of quitting  Vaping Use   Vaping Use: Never used  Substance Use Topics   Alcohol use: Yes    Alcohol/week: 4.0 standard drinks of alcohol    Types: 4 Cans of beer per week    Comment: occasionally   Drug use: No     Names of Other Physician/Practitioners you currently use: 1. [facility] here for primary care 2. Eye Exam: Due for 2021 3. Dental Exam UTD 2021   Patient Care Team: Unk [name], MD as PCP - General (Internal Medicine) Wyatt Portela, MD as Consulting Physician (Oncology) Tyler Pita, MD as Consulting Physician (Radiation Oncology)    Screening Tests: Immunization History  Administered Date(s) Administered   Influenza,inj,Quad PF,6+ Mos 12/27/2017, 12/15/2018, 01/07/2022   Janssen (J&J) SARS-COV-2 Vaccination 03/27/2019   PPD Test 03/21/2020, 03/31/2021   Pneumococcal  Polysaccharide-23 01/11/1997, 12/27/2017   Td 01/12/2004   Tdap 07/09/2016   Zoster, Live 01/12/2008     Review of Systems  Constitutional:  Negative for chills, fever and weight loss.  HENT:  Negative for congestion and hearing loss.   Eyes:  Negative for blurred vision and double vision.  Respiratory:  Negative for cough and shortness of breath.   Cardiovascular:  Negative for chest pain, palpitations, orthopnea and leg swelling.  Gastrointestinal:  Negative for abdominal pain, constipation, diarrhea, heartburn, nausea and vomiting.  Genitourinary:  Negative for dysuria.  Musculoskeletal:  Negative for falls, joint pain and myalgias.  Skin:  Negative for rash.  Neurological:  Negative for dizziness, tingling, tremors, loss of consciousness and headaches.  Endo/Heme/Allergies:  Does not bruise/bleed easily.  Psychiatric/Behavioral:  Negative for depression, memory loss and suicidal ideas.     Physical Exam: BP 128/72   Pulse 69   Temp (!) 97.3 F (36.3 C)   Ht _0  (1.676 m)   Wt 262 lb 3.2 oz (118.9 kg)   SpO2 92%   BMI 42.32 kg/m  Wt Readings from Last 3 Encounters:  01/07/22 262 lb 3.2 oz (118.9 kg)  10/05/21 255 lb 3.2 oz (115.8 kg)  07/01/21 259 lb 12.8 oz (117.8 kg)   General Appearance: Well nourished, in no apparent distress. Eyes: PERRLA, EOMs, conjunctiva no swelling or erythema Sinuses: No Frontal/maxillary tenderness ENT/Mouth: Ext aud canals clear, TMs without erythema, bulging. No erythema, swelling, or exudate on post pharynx.  Tonsils not swollen or erythematous. Hearing normal.  Neck: Supple, thyroid normal.  Respiratory: Respiratory effort normal, BS equal bilaterally without rales, rhonchi, wheezing or stridor.  Cardio: RRR with no MRGs. Brisk peripheral pulses without edema.  Abdomen: Soft, + BS.  Non tender, no guarding, rebound, hernias, masses. Lymphatics: Non tender without lymphadenopathy.  Musculoskeletal: Full ROM, 5/5 strength, Normal  gait Skin: Appropriate color for ethnicity. Warm.  Area cleansed with alcohol snipped with scissors and silver nitrate applied Neuro: Cranial nerves intact. No cerebellar symptoms.  Psych: Awake and oriented X  3, normal affect, Insight and Judgment appropriate.   Darrol Jump, NP [facility]

## 2022-02-14 ENCOUNTER — Inpatient Hospital Stay (HOSPITAL_COMMUNITY)
Admission: EM | Admit: 2022-02-14 | Discharge: 2022-02-17 | DRG: 871 | Disposition: A | Discharge status: Home / Self Care | Payer: BC Managed Care – PPO | Attending: Internal Medicine | Admitting: Internal Medicine

## 2022-02-14 DIAGNOSIS — I10 Essential (primary) hypertension: Secondary | ICD-10-CM | POA: Diagnosis not present

## 2022-02-14 DIAGNOSIS — R7989 Other specified abnormal findings of blood chemistry: Secondary | ICD-10-CM | POA: Diagnosis present

## 2022-02-14 DIAGNOSIS — J440 Chronic obstructive pulmonary disease with acute lower respiratory infection: Secondary | ICD-10-CM | POA: Diagnosis not present

## 2022-02-14 DIAGNOSIS — A419 Sepsis, unspecified organism: Secondary | ICD-10-CM | POA: Diagnosis not present

## 2022-02-14 DIAGNOSIS — Z7982 Long term (current) use of aspirin: Secondary | ICD-10-CM

## 2022-02-14 DIAGNOSIS — J441 Chronic obstructive pulmonary disease with (acute) exacerbation: Secondary | ICD-10-CM | POA: Diagnosis present

## 2022-02-14 DIAGNOSIS — E861 Hypovolemia: Secondary | ICD-10-CM | POA: Diagnosis present

## 2022-02-14 DIAGNOSIS — E785 Hyperlipidemia, unspecified: Secondary | ICD-10-CM | POA: Diagnosis not present

## 2022-02-14 DIAGNOSIS — M1990 Unspecified osteoarthritis, unspecified site: Secondary | ICD-10-CM | POA: Diagnosis not present

## 2022-02-14 DIAGNOSIS — J449 Chronic obstructive pulmonary disease, unspecified: Secondary | ICD-10-CM

## 2022-02-14 DIAGNOSIS — E559 Vitamin D deficiency, unspecified: Secondary | ICD-10-CM | POA: Diagnosis present

## 2022-02-14 DIAGNOSIS — E878 Other disorders of electrolyte and fluid balance, not elsewhere classified: Secondary | ICD-10-CM | POA: Diagnosis present

## 2022-02-14 DIAGNOSIS — Z7951 Long term (current) use of inhaled steroids: Secondary | ICD-10-CM

## 2022-02-14 DIAGNOSIS — R0602 Shortness of breath: Secondary | ICD-10-CM | POA: Diagnosis not present

## 2022-02-14 DIAGNOSIS — Z720 Tobacco use: Secondary | ICD-10-CM | POA: Diagnosis present

## 2022-02-14 DIAGNOSIS — J9602 Acute respiratory failure with hypercapnia: Secondary | ICD-10-CM | POA: Diagnosis present

## 2022-02-14 DIAGNOSIS — J189 Pneumonia, unspecified organism: Secondary | ICD-10-CM

## 2022-02-14 DIAGNOSIS — J9601 Acute respiratory failure with hypoxia: Secondary | ICD-10-CM

## 2022-02-14 DIAGNOSIS — Z28310 Unvaccinated for covid-19: Secondary | ICD-10-CM | POA: Diagnosis not present

## 2022-02-14 DIAGNOSIS — Z79899 Other long term (current) drug therapy: Secondary | ICD-10-CM

## 2022-02-14 DIAGNOSIS — R062 Wheezing: Secondary | ICD-10-CM | POA: Diagnosis not present

## 2022-02-14 DIAGNOSIS — F17210 Nicotine dependence, cigarettes, uncomplicated: Secondary | ICD-10-CM | POA: Diagnosis not present

## 2022-02-14 DIAGNOSIS — E871 Hypo-osmolality and hyponatremia: Secondary | ICD-10-CM | POA: Diagnosis not present

## 2022-02-14 DIAGNOSIS — Z885 Allergy status to narcotic agent status: Secondary | ICD-10-CM | POA: Diagnosis not present

## 2022-02-14 DIAGNOSIS — R7309 Other abnormal glucose: Secondary | ICD-10-CM

## 2022-02-14 DIAGNOSIS — Z1152 Encounter for screening for COVID-19: Secondary | ICD-10-CM

## 2022-02-14 DIAGNOSIS — Z91041 Radiographic dye allergy status: Secondary | ICD-10-CM

## 2022-02-15 ENCOUNTER — Other Ambulatory Visit: Payer: Self-pay

## 2022-02-15 ENCOUNTER — Encounter (HOSPITAL_COMMUNITY): Payer: Self-pay

## 2022-02-15 ENCOUNTER — Emergency Department (HOSPITAL_COMMUNITY): Payer: BC Managed Care – PPO

## 2022-02-15 DIAGNOSIS — J9601 Acute respiratory failure with hypoxia: Secondary | ICD-10-CM | POA: Diagnosis present

## 2022-02-15 DIAGNOSIS — J441 Chronic obstructive pulmonary disease with (acute) exacerbation: Secondary | ICD-10-CM | POA: Diagnosis present

## 2022-02-15 DIAGNOSIS — E785 Hyperlipidemia, unspecified: Secondary | ICD-10-CM | POA: Insufficient documentation

## 2022-02-15 DIAGNOSIS — A419 Sepsis, unspecified organism: Secondary | ICD-10-CM | POA: Diagnosis present

## 2022-02-15 DIAGNOSIS — J189 Pneumonia, unspecified organism: Secondary | ICD-10-CM | POA: Diagnosis present

## 2022-02-15 DIAGNOSIS — Z1152 Encounter for screening for COVID-19: Secondary | ICD-10-CM | POA: Diagnosis not present

## 2022-02-15 DIAGNOSIS — F17210 Nicotine dependence, cigarettes, uncomplicated: Secondary | ICD-10-CM | POA: Diagnosis present

## 2022-02-15 DIAGNOSIS — E871 Hypo-osmolality and hyponatremia: Secondary | ICD-10-CM

## 2022-02-15 DIAGNOSIS — Z79899 Other long term (current) drug therapy: Secondary | ICD-10-CM | POA: Diagnosis not present

## 2022-02-15 DIAGNOSIS — E878 Other disorders of electrolyte and fluid balance, not elsewhere classified: Secondary | ICD-10-CM | POA: Diagnosis present

## 2022-02-15 DIAGNOSIS — J9602 Acute respiratory failure with hypercapnia: Secondary | ICD-10-CM

## 2022-02-15 DIAGNOSIS — J440 Chronic obstructive pulmonary disease with acute lower respiratory infection: Secondary | ICD-10-CM | POA: Diagnosis present

## 2022-02-15 DIAGNOSIS — Z7982 Long term (current) use of aspirin: Secondary | ICD-10-CM | POA: Diagnosis not present

## 2022-02-15 DIAGNOSIS — Z885 Allergy status to narcotic agent status: Secondary | ICD-10-CM | POA: Diagnosis not present

## 2022-02-15 DIAGNOSIS — E861 Hypovolemia: Secondary | ICD-10-CM | POA: Diagnosis present

## 2022-02-15 DIAGNOSIS — R0602 Shortness of breath: Secondary | ICD-10-CM | POA: Diagnosis present

## 2022-02-15 DIAGNOSIS — E559 Vitamin D deficiency, unspecified: Secondary | ICD-10-CM | POA: Diagnosis present

## 2022-02-15 DIAGNOSIS — I10 Essential (primary) hypertension: Secondary | ICD-10-CM | POA: Diagnosis present

## 2022-02-15 DIAGNOSIS — R7989 Other specified abnormal findings of blood chemistry: Secondary | ICD-10-CM | POA: Diagnosis present

## 2022-02-15 DIAGNOSIS — Z28310 Unvaccinated for covid-19: Secondary | ICD-10-CM | POA: Diagnosis not present

## 2022-02-15 DIAGNOSIS — M1990 Unspecified osteoarthritis, unspecified site: Secondary | ICD-10-CM | POA: Diagnosis present

## 2022-02-15 DIAGNOSIS — Z91041 Radiographic dye allergy status: Secondary | ICD-10-CM | POA: Diagnosis not present

## 2022-02-15 DIAGNOSIS — Z7951 Long term (current) use of inhaled steroids: Secondary | ICD-10-CM | POA: Diagnosis not present

## 2022-02-15 LAB — I-STAT ARTERIAL BLOOD GAS, ED
Acid-Base Excess: 1 mmol/L (ref 0.0–2.0)
Bicarbonate: 29 mmol/L — ABNORMAL HIGH (ref 20.0–28.0)
Calcium, Ion: 1.19 mmol/L (ref 1.15–1.40)
HCT: 47 % (ref 39.0–52.0)
Hemoglobin: 16 g/dL (ref 13.0–17.0)
O2 Saturation: 98 %
Patient temperature: 98.6
Potassium: 3.5 mmol/L (ref 3.5–5.1)
Sodium: 131 mmol/L — ABNORMAL LOW (ref 135–145)
TCO2: 31 mmol/L (ref 22–32)
pCO2 arterial: 58.5 mmHg — ABNORMAL HIGH (ref 32–48)
pH, Arterial: 7.303 — ABNORMAL LOW (ref 7.35–7.45)
pO2, Arterial: 117 mmHg — ABNORMAL HIGH (ref 83–108)

## 2022-02-15 LAB — COMPREHENSIVE METABOLIC PANEL
ALT: 19 U/L (ref 0–44)
AST: 23 U/L (ref 15–41)
Albumin: 3.5 g/dL (ref 3.5–5.0)
Alkaline Phosphatase: 68 U/L (ref 38–126)
Anion gap: 15 (ref 5–15)
BUN: 11 mg/dL (ref 8–23)
CO2: 21 mmol/L — ABNORMAL LOW (ref 22–32)
Calcium: 8.6 mg/dL — ABNORMAL LOW (ref 8.9–10.3)
Chloride: 93 mmol/L — ABNORMAL LOW (ref 98–111)
Creatinine, Ser: 0.72 mg/dL (ref 0.61–1.24)
GFR, Estimated: >60 mL/min (ref >60)
Glucose, Bld: 184 mg/dL — ABNORMAL HIGH (ref 70–99)
Potassium: 4.1 mmol/L (ref 3.5–5.1)
Sodium: 129 mmol/L — ABNORMAL LOW (ref 135–145)
Total Bilirubin: 1.4 mg/dL — ABNORMAL HIGH (ref 0.3–1.2)
Total Protein: 7.3 g/dL (ref 6.5–8.1)

## 2022-02-15 LAB — CBC WITH DIFFERENTIAL/PLATELET
Abs Immature Granulocytes: 0.08 10*3/uL — ABNORMAL HIGH (ref 0.00–0.07)
Basophils Absolute: 0.1 10*3/uL (ref 0.0–0.1)
Basophils Relative: 0 %
Eosinophils Absolute: 0 10*3/uL (ref 0.0–0.5)
Eosinophils Relative: 0 %
HCT: 49.7 % (ref 39.0–52.0)
Hemoglobin: 16 g/dL (ref 13.0–17.0)
Immature Granulocytes: 1 %
Lymphocytes Relative: 13 %
Lymphs Abs: 2.1 10*3/uL (ref 0.7–4.0)
MCH: 32.1 pg (ref 26.0–34.0)
MCHC: 32.2 g/dL (ref 30.0–36.0)
MCV: 99.8 fL (ref 80.0–100.0)
Monocytes Absolute: 2.3 10*3/uL — ABNORMAL HIGH (ref 0.1–1.0)
Monocytes Relative: 14 %
Neutro Abs: 12 10*3/uL — ABNORMAL HIGH (ref 1.7–7.7)
Neutrophils Relative %: 72 %
Platelets: 215 10*3/uL (ref 150–400)
RBC: 4.98 MIL/uL (ref 4.22–5.81)
RDW: 12.7 % (ref 11.5–15.5)
WBC: 16.5 10*3/uL — ABNORMAL HIGH (ref 4.0–10.5)
nRBC: 0 % (ref 0.0–0.2)

## 2022-02-15 LAB — CBC
HCT: 45.7 % (ref 39.0–52.0)
Hemoglobin: 15.5 g/dL (ref 13.0–17.0)
MCH: 32.8 pg (ref 26.0–34.0)
MCHC: 33.9 g/dL (ref 30.0–36.0)
MCV: 96.6 fL (ref 80.0–100.0)
Platelets: 189 10*3/uL (ref 150–400)
RBC: 4.73 MIL/uL (ref 4.22–5.81)
RDW: 12.5 % (ref 11.5–15.5)
WBC: 14.2 10*3/uL — ABNORMAL HIGH (ref 4.0–10.5)
nRBC: 0 % (ref 0.0–0.2)

## 2022-02-15 LAB — BRAIN NATRIURETIC PEPTIDE: B Natriuretic Peptide: 114.7 pg/mL — ABNORMAL HIGH (ref 0.0–100.0)

## 2022-02-15 LAB — BASIC METABOLIC PANEL
Anion gap: 9 (ref 5–15)
BUN: 10 mg/dL (ref 8–23)
CO2: 28 mmol/L (ref 22–32)
Calcium: 8.3 mg/dL — ABNORMAL LOW (ref 8.9–10.3)
Chloride: 94 mmol/L — ABNORMAL LOW (ref 98–111)
Creatinine, Ser: 0.76 mg/dL (ref 0.61–1.24)
GFR, Estimated: >60 mL/min (ref >60)
Glucose, Bld: 177 mg/dL — ABNORMAL HIGH (ref 70–99)
Potassium: 4 mmol/L (ref 3.5–5.1)
Sodium: 131 mmol/L — ABNORMAL LOW (ref 135–145)

## 2022-02-15 LAB — RESP PANEL BY RT-PCR (RSV, FLU A&B, COVID)  RVPGX2
Influenza A by PCR: NEGATIVE
Influenza B by PCR: NEGATIVE
Resp Syncytial Virus by PCR: NEGATIVE
SARS Coronavirus 2 by RT PCR: NEGATIVE

## 2022-02-15 LAB — PROTIME-INR
INR: 1.1 (ref 0.8–1.2)
Prothrombin Time: 14.3 seconds (ref 11.4–15.2)

## 2022-02-15 LAB — TROPONIN I (HIGH SENSITIVITY): Troponin I (High Sensitivity): 24 ng/L — ABNORMAL HIGH (ref <18)

## 2022-02-15 LAB — CORTISOL-AM, BLOOD: Cortisol - AM: 45.2 ug/dL — ABNORMAL HIGH (ref 6.7–22.6)

## 2022-02-15 LAB — HIV ANTIBODY (ROUTINE TESTING W REFLEX): HIV Screen 4th Generation wRfx: NONREACTIVE

## 2022-02-15 LAB — PROCALCITONIN: Procalcitonin: <0.1 ng/mL

## 2022-02-15 MED ORDER — ASPIRIN 81 MG PO TBEC
81.0000 mg | DELAYED_RELEASE_TABLET | Freq: Every day | ORAL | Status: DC
  Administered 2022-02-16 – 2022-02-17 (×2): 81 mg via ORAL
  Filled 2022-02-15 (×3): qty 1

## 2022-02-15 MED ORDER — HYDROCOD POLI-CHLORPHE POLI ER 10-8 MG/5ML PO SUER: 5.0000 mL | Freq: Two times a day (BID) | ORAL | Status: DC | PRN

## 2022-02-15 MED ORDER — ACETAMINOPHEN 325 MG PO TABS
650.0000 mg | ORAL_TABLET | Freq: Four times a day (QID) | ORAL | Status: DC | PRN
  Administered 2022-02-15: 650 mg via ORAL
  Filled 2022-02-15: qty 2

## 2022-02-15 MED ORDER — ONDANSETRON HCL 4 MG PO TABS: 4.0000 mg | ORAL_TABLET | Freq: Four times a day (QID) | ORAL | Status: DC | PRN

## 2022-02-15 MED ORDER — SODIUM CHLORIDE 0.9 % IV SOLN
INTRAVENOUS | Status: DC
  Administered 2022-02-16: 70 mL via INTRAVENOUS

## 2022-02-15 MED ORDER — TRAZODONE HCL 50 MG PO TABS: 25.0000 mg | ORAL_TABLET | Freq: Every evening | ORAL | Status: DC | PRN

## 2022-02-15 MED ORDER — SODIUM CHLORIDE 0.9 % IV SOLN: 1.0000 g | Freq: Once | INTRAVENOUS | Status: DC

## 2022-02-15 MED ORDER — ALBUTEROL SULFATE (2.5 MG/3ML) 0.083% IN NEBU
5.0000 mg | INHALATION_SOLUTION | Freq: Once | RESPIRATORY_TRACT | Status: AC
  Administered 2022-02-15: 5 mg via RESPIRATORY_TRACT
  Filled 2022-02-15: qty 6

## 2022-02-15 MED ORDER — SODIUM CHLORIDE 0.9 % IV SOLN
2.0000 g | INTRAVENOUS | Status: DC
  Administered 2022-02-15 – 2022-02-16 (×2): 2 g via INTRAVENOUS
  Filled 2022-02-15 (×2): qty 20

## 2022-02-15 MED ORDER — SODIUM CHLORIDE 0.9 % IV SOLN: 2.0000 g | Freq: Once | INTRAVENOUS | Status: DC

## 2022-02-15 MED ORDER — ONDANSETRON HCL 4 MG/2ML IJ SOLN: 4.0000 mg | Freq: Four times a day (QID) | INTRAMUSCULAR | Status: DC | PRN

## 2022-02-15 MED ORDER — IPRATROPIUM BROMIDE 0.02 % IN SOLN
0.5000 mg | Freq: Once | RESPIRATORY_TRACT | Status: AC
  Administered 2022-02-15: 0.5 mg via RESPIRATORY_TRACT
  Filled 2022-02-15: qty 2.5

## 2022-02-15 MED ORDER — ASPIRIN 81 MG PO CHEW
324.0000 mg | CHEWABLE_TABLET | Freq: Once | ORAL | Status: AC
  Administered 2022-02-15: 324 mg via ORAL
  Filled 2022-02-15: qty 4

## 2022-02-15 MED ORDER — SODIUM CHLORIDE 0.9 % IV SOLN
2.0000 g | Freq: Once | INTRAVENOUS | Status: AC
  Administered 2022-02-15: 2 g via INTRAVENOUS
  Filled 2022-02-15: qty 20

## 2022-02-15 MED ORDER — IPRATROPIUM-ALBUTEROL 0.5-2.5 (3) MG/3ML IN SOLN
3.0000 mL | Freq: Four times a day (QID) | RESPIRATORY_TRACT | Status: DC
  Administered 2022-02-15: 3 mL via RESPIRATORY_TRACT
  Filled 2022-02-15: qty 3

## 2022-02-15 MED ORDER — VARENICLINE TARTRATE 0.5 MG PO TABS
0.5000 mg | ORAL_TABLET | Freq: Two times a day (BID) | ORAL | Status: DC
  Administered 2022-02-15 (×2): 1 mg via ORAL
  Filled 2022-02-15 (×3): qty 2

## 2022-02-15 MED ORDER — SODIUM CHLORIDE 0.9 % IV SOLN
500.0000 mg | Freq: Once | INTRAVENOUS | Status: AC
  Administered 2022-02-15: 500 mg via INTRAVENOUS
  Filled 2022-02-15: qty 5

## 2022-02-15 MED ORDER — SODIUM CHLORIDE 0.9 % IV SOLN
500.0000 mg | INTRAVENOUS | Status: DC
  Administered 2022-02-15 – 2022-02-16 (×2): 500 mg via INTRAVENOUS
  Filled 2022-02-15 (×3): qty 5

## 2022-02-15 MED ORDER — ENOXAPARIN SODIUM 40 MG/0.4ML IJ SOSY
40.0000 mg | PREFILLED_SYRINGE | INTRAMUSCULAR | Status: DC
  Administered 2022-02-15 – 2022-02-17 (×3): 40 mg via SUBCUTANEOUS
  Filled 2022-02-15 (×3): qty 0.4

## 2022-02-15 MED ORDER — GUAIFENESIN ER 600 MG PO TB12
600.0000 mg | ORAL_TABLET | Freq: Two times a day (BID) | ORAL | Status: DC
  Administered 2022-02-15 – 2022-02-17 (×5): 600 mg via ORAL
  Filled 2022-02-15 (×6): qty 1

## 2022-02-15 MED ORDER — HYDROCHLOROTHIAZIDE 25 MG PO TABS
25.0000 mg | ORAL_TABLET | Freq: Every day | ORAL | Status: DC
  Administered 2022-02-15: 25 mg via ORAL
  Filled 2022-02-15: qty 1

## 2022-02-15 MED ORDER — FUROSEMIDE 10 MG/ML IJ SOLN
40.0000 mg | Freq: Once | INTRAMUSCULAR | Status: AC
  Administered 2022-02-15: 40 mg via INTRAVENOUS
  Filled 2022-02-15: qty 4

## 2022-02-15 MED ORDER — MAGNESIUM HYDROXIDE 400 MG/5ML PO SUSP: 30.0000 mL | Freq: Every day | ORAL | Status: DC | PRN

## 2022-02-15 MED ORDER — MAGNESIUM OXIDE -MG SUPPLEMENT 400 (240 MG) MG PO TABS
400.0000 mg | ORAL_TABLET | Freq: Every day | ORAL | Status: DC
  Administered 2022-02-15 – 2022-02-17 (×3): 400 mg via ORAL
  Filled 2022-02-15 (×3): qty 1

## 2022-02-15 MED ORDER — ATORVASTATIN CALCIUM 10 MG PO TABS
20.0000 mg | ORAL_TABLET | Freq: Every day | ORAL | Status: DC
  Administered 2022-02-15 – 2022-02-17 (×3): 20 mg via ORAL
  Filled 2022-02-15 (×3): qty 2

## 2022-02-15 MED ORDER — ACETAMINOPHEN 650 MG RE SUPP: 650.0000 mg | Freq: Four times a day (QID) | RECTAL | Status: DC | PRN

## 2022-02-15 MED ORDER — BENAZEPRIL HCL 20 MG PO TABS
20.0000 mg | ORAL_TABLET | Freq: Every day | ORAL | Status: DC
  Administered 2022-02-15 – 2022-02-17 (×3): 20 mg via ORAL
  Filled 2022-02-15 (×4): qty 1

## 2022-02-15 MED ORDER — METHYLPREDNISOLONE SODIUM SUCC 40 MG IJ SOLR
40.0000 mg | Freq: Two times a day (BID) | INTRAMUSCULAR | Status: DC
  Administered 2022-02-15 – 2022-02-17 (×5): 40 mg via INTRAVENOUS
  Filled 2022-02-15 (×5): qty 1

## 2022-02-15 MED ORDER — IPRATROPIUM-ALBUTEROL 0.5-2.5 (3) MG/3ML IN SOLN
3.0000 mL | Freq: Four times a day (QID) | RESPIRATORY_TRACT | Status: DC
  Administered 2022-02-15 – 2022-02-16 (×3): 3 mL via RESPIRATORY_TRACT
  Filled 2022-02-15 (×3): qty 3

## 2022-02-15 NOTE — Assessment & Plan Note (Signed)
-   I counseled him for smoking cessation and he will receive further counseling here.

## 2022-02-15 NOTE — Assessment & Plan Note (Addendum)
-   The patient will be admitted to a medically monitored bed. - We will place the patient IV steroid therapy with IV Solu-Medrol as well as nebulized bronchodilator therapy with duonebs q.i.d. and q.4 hours p.r.n.Marland Kitchen - Mucolytic therapy will be provided with Mucinex and antibiotic therapy with IV Rocephin and Zithromax. - O2 protocol will be followed. - We will hold off long-acting beta agonist.

## 2022-02-15 NOTE — H&P (Signed)
[place]   PATIENT NAME: Robert [name]    MR#:  [id number]  DATE OF BIRTH:  05/18/1955  DATE OF ADMISSION:  02/14/2022  PRIMARY CARE PHYSICIAN: Unk [name], MD   Patient is coming from: Home  REQUESTING/REFERRING PHYSICIAN: Delora Fuel, MD  CHIEF COMPLAINT:   Chief Complaint  Patient presents with   Shortness of Breath    Pt BIBA for SOB/ 65% on room air when fire dept arrived. Pt hx of COPD. Received albuterol, 2 mags and 2 mg solumedrol by EMS. Pt placed on CPAP by Ems. Pt [id number].     HISTORY OF PRESENT ILLNESS:  Robert [name] is a 67 y.o. Caucasian male with medical history significant for COPD, osteoarthritis and essential hypertension, who presented to the emergency room with acute onset of progressive dyspnea over the last 24 hours.  Per EMS the patient was hardly moving air initially it was noted to have bibasilar rales and expiratory wheezes.  He was given DuoNebs, magnesium sulfate and IV Solu-Medrol.  He admitted to dyspnea with associated dry cough and wheezing since Friday.  He has been having tactile fever and chills.  He has not been vaccinated for COVID-19.  He denies any chest pain or palpitations.  No hemoptysis.  No nausea or vomiting or abdominal pain.  No dysuria, oliguria or hematuria or flank pain.  He continues to smoke 5 to 6 cigarettes/day.  He denies any orthopnea or paroxysmal nocturnal dyspnea or worsening lower extremity edema.  ED Course: When he came to the ER BP was 165/86 with heart rate 115 and respiratory rate of 23 with pulse currently of 99% on CPAP at 50% FiO2 and later on on BiPAP.  ABG showed pH 7.303, pCO2 58.8 and pO2 of 117 with HCO3 29 and O2 saturation of 98%.  BMP showed hyponatremia 129 and hypochloremia of 93 with a CO2 of 21 and blood glucose of 184, total bili 1.4 and otherwise unremarkable CMP.  High sensitive troponin was 24 and BNP was 114.7 CBC showed leukocytosis 16.5 with neutrophilia.  Influenza, RSV and COVID-19 PCR came  back negative. EKG as reviewed by me :  EKG showed sinus tachycardia with rate of 116 with suspected right atrial enlargement, incomplete right bundle branch block and left anterior fascicular block with probable RVH. Imaging: Portable chest x-ray showed increased markings in the lung bases that reflect atelectasis or infection.  The patient was given IV Rocephin and Zithromax, 40 mg of IV Lasix nebulized bronchodilator therapy.  He will be admitted to a medical telemetry bed for further evaluation and management. 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   COPD (chronic obstructive pulmonary disease) (HCC)    Elevated hemoglobin A1c    Elevated PSA 03/16/2016   Hypertension    Personal history of colonic adenomas 12/28/2007   Vitamin D deficiency     PAST SURGICAL HISTORY:   Past Surgical History:  Procedure Laterality Date   COLONOSCOPY  2009   CG  hems, TAs   LYMPHADENECTOMY N/A 06/28/2016   Procedure: LYMPHADENECTOMY;  Surgeon: Raynelle Bring, MD;  Location: [facility];  Service: Urology;  Laterality: N/A;   PROSTATE BIOPSY  03/16/2016   Surgical Pathology Gross and Microscopic Examination for Prostate Needle   ROBOT ASSISTED LAPAROSCOPIC RADICAL PROSTATECTOMY N/A 06/28/2016   Procedure: XI ROBOTIC ASSISTED LAPAROSCOPIC RADICAL PROSTATECTOMY LEVEL 2 EXCISION OF PENILE LESION;  Surgeon: Raynelle Bring, MD;  Location: [facility];  Service: Urology;  Laterality: N/A;    SOCIAL HISTORY:   Social History   Tobacco Use   Smoking status: Every Day    Packs/day: 0.50    Years: 40.00    Total pack years: 20.00    Types: Cigarettes   Smokeless tobacco: Never   Tobacco comments:    has decreased smoking significantly with goal of quitting  Substance Use Topics   Alcohol use: Yes    Alcohol/week: 4.0 standard drinks of alcohol    Types: 4 Cans of beer per week    Comment: occasionally    FAMILY  HISTORY:   Family History  Problem Relation Age of Onset   Cancer Mother        lung   Lung disease Neg Hx    Colon cancer Neg Hx    Rectal cancer Neg Hx    Stomach cancer Neg Hx     DRUG ALLERGIES:   Allergies  Allergen Reactions   Codeine Other (See Comments)    REACTION: Itching   Isovue [Iopamidol] Hives    Pt. Developed 1 hive after injection; Dr. Kris Hartmann spoke with patient;recommends 13 hr prep in the future    REVIEW OF SYSTEMS:   ROS As per history of present illness. All pertinent systems were reviewed above. Constitutional, HEENT, cardiovascular, respiratory, GI, GU, musculoskeletal, neuro, psychiatric, endocrine, integumentary and hematologic systems were reviewed and are otherwise negative/unremarkable except for positive findings mentioned above in the HPI.   MEDICATIONS AT HOME:   Prior to Admission medications   Medication Sig Start Date End Date Taking? Authorizing Provider  aspirin EC 81 MG tablet Take 81 mg by mouth daily.    [provider]  atorvastatin (LIPITOR) 20 MG tablet Take  1 tablet  Daily  for Cholesterol                                            /                     TAKE                          BY                       MOUTH 05/31/21   Unk [name], MD  benazepril-hydrochlorthiazide (LOTENSIN HCT) 20-25 MG tablet TAKE ONE TABLET BY MOUTH DAILY FOR BLOOD PRESSURE AND FLUID RETENTION/ANKLE SWELLING 12/10/21   Alycia Rossetti, NP  Budeson-Glycopyrrol-Formoterol (BREZTRI AEROSPHERE) 160-9-4.8 MCG/ACT AERO Use  2 Inhalations  15 minutes  Apart  2 x /day (every 12 hours) 08/07/21   Unk [name], MD  Cholecalciferol (VITAMIN D) 125 MCG (5000 UT) CAPS Take by mouth.    [provider]  Magnesium 500 MG TABS Take 500 mg by mouth daily.    [provider]  varenicline (CHANTIX CONTINUING MONTH PAK) 1 MG tablet Take 0.5-1 tablets (0.5-1 mg total) by mouth 2 (two) times daily for smoking cessation 10/05/21   Unk [name],  MD  VENTOLIN HFA 108 [phone] Base) MCG/ACT inhaler INHALE TWO PUFFS BY MOUTH INTO LUNGS EVERY 6 HOURS AS NEEDED FOR WHEEZING/SHORTNESS OF BREATH 11/02/21   Alycia Rossetti, NP      VITAL SIGNS:  Blood pressure 127/67,  pulse 83, temperature 99.1 F (37.3 C), resp. rate 20, SpO2 95 %.  PHYSICAL EXAMINATION:  Physical Exam  GENERAL:  67 y.o.-year-old Caucasian male patient lying in the bed with mild respiratory tori distress with conversational dyspnea EYES: Pupils equal, round, reactive to light and accommodation. No scleral icterus. Extraocular muscles intact.  HEENT: Head atraumatic, normocephalic. Oropharynx and nasopharynx clear.  NECK:  Supple, no jugular venous distention. No thyroid enlargement, no tenderness.  LUNGS: Diminished bibasilar breath sounds with bibasal crackles as well as diffuse residual extremity wheezes with diminished EXTR airflow and harsh vesicular breathing.. No use of accessory muscles of respiration.  CARDIOVASCULAR: Regular rate and rhythm, S1, S2 normal. No murmurs, rubs, or gallops.  ABDOMEN: Soft, nondistended, nontender. Bowel sounds present. No organomegaly or mass.  EXTREMITIES: No pedal edema, cyanosis, or clubbing.  NEUROLOGIC: Cranial nerves II through XII are intact. Muscle strength 5/5 in all extremities. Sensation intact. Gait not checked.  PSYCHIATRIC: The patient is alert and oriented x 3.  Normal affect and good eye contact. SKIN: No obvious rash, lesion, or ulcer.   LABORATORY PANEL:   CBC Recent Labs  Lab 02/15/22 0436  WBC 14.2*  HGB 15.5  HCT 45.7  PLT 189   ------------------------------------------------------------------------------------------------------------------  Chemistries  Recent Labs  Lab 02/15/22 0006 02/15/22 0039 02/15/22 0436  NA 129*   < > 131*  K 4.1   < > 4.0  CL 93*  --  94*  CO2 21*  --  28  GLUCOSE 184*  --  177*  BUN 11  --  10  CREATININE 0.72  --  0.76  CALCIUM 8.6*  --  8.3*  AST 23  --   --    ALT 19  --   --   ALKPHOS 68  --   --   BILITOT 1.4*  --   --    < > = values in this interval not displayed.   ------------------------------------------------------------------------------------------------------------------  Cardiac Enzymes No results for input(s): "TROPONINI" in the last 168 hours. ------------------------------------------------------------------------------------------------------------------  RADIOLOGY:  DG Chest Port 1 View  Result Date: 02/15/2022 CLINICAL DATA:  Shortness of breath EXAM: PORTABLE CHEST 1 VIEW COMPARISON:  01/29/2018 FINDINGS: Heart is normal size. Increased markings in the lower lobes bilaterally. No visible effusions. Mediastinal contours within normal limits. No acute bony abnormality. IMPRESSION: Increased markings in the lung bases could reflect atelectasis or infection Electronically Signed   By: Rolm Baptise M.D.   On: 02/15/2022 00:40      IMPRESSION AND PLAN:  Assessment and Plan: * COPD exacerbation ([place]) - The patient will be admitted to a medically monitored bed. - We will place the patient IV steroid therapy with IV Solu-Medrol as well as nebulized bronchodilator therapy with duonebs q.i.d. and q.4 hours p.r.n.Marland Kitchen - Mucolytic therapy will be provided with Mucinex and antibiotic therapy with IV Rocephin and Zithromax. - O2 protocol will be followed. - We will hold off long-acting beta agonist.   CAP (community acquired pneumonia) - The patient will be placed on IV Rocephin and Zithromax. - We will follow blood cultures. - Mucolytic therapy and bronchodilator therapy will be provided.  Sepsis due to undetermined organism [facility]) - This is manifested by tachycardia, tachypnea and leukocytosis, clearly secondary to his pneumonia. - The patient will be hydrated with IV normal saline. - Management otherwise as above. - We will follow blood cultures.  Hyponatremia - This is likely hypovolemic. - He will be hydrated with IV normal  saline. - We  will follow BMPs.  Tobacco abuse - I counseled him for smoking cessation and he will receive further counseling here.  Dyslipidemia - We will do statin therapy.  Essential hypertension - We will continue his antihypertensives.   DVT prophylaxis: Lovenox.  Advanced Care Planning:  Code Status: full code.  Family Communication:  The plan of care was discussed in details with the patient (and family). I answered all questions. The patient agreed to proceed with the above mentioned plan. Further management will depend upon hospital course. Disposition Plan: Back to previous home environment Consults called: none.  All the records are reviewed and case discussed with ED provider.  Status is: Inpatient    At the time of the admission, it appears that the appropriate admission status for this patient is inpatient.  This is judged to be reasonable and necessary in order to provide the required intensity of service to ensure the patient's safety given the presenting symptoms, physical exam findings and initial radiographic and laboratory data in the context of comorbid conditions.  The patient requires inpatient status due to high intensity of service, high risk of further deterioration and high frequency of surveillance required.  I certify that at the time of admission, it is my clinical judgment that the patient will require inpatient hospital care extending more than 2 midnights.                            Dispo: The patient is from: Home              Anticipated d/c is to: Home              Patient currently is not medically stable to d/c.              Difficult to place patient: No  Christel Mormon M.D on 02/15/2022 at 6:15 AM  [facility]   From 7 PM-7 AM, contact night-coverage [email or web address]  CC: Primary care physician; Unk [name], MD

## 2022-02-15 NOTE — Assessment & Plan Note (Signed)
-   The patient will be placed on IV Rocephin and Zithromax. - We will follow blood cultures. - Mucolytic therapy and bronchodilator therapy will be provided.

## 2022-02-15 NOTE — Assessment & Plan Note (Signed)
-   This is likely hypovolemic. - He will be hydrated with IV normal saline. - We will follow BMPs.

## 2022-02-15 NOTE — Assessment & Plan Note (Signed)
-   This is manifested by tachycardia, tachypnea and leukocytosis, clearly secondary to his pneumonia. - The patient will be hydrated with IV normal saline. - Management otherwise as above. - We will follow blood cultures.

## 2022-02-15 NOTE — ED Provider Notes (Signed)
[name]   CSN: [id number] Arrival date & time: 02/14/22  2358     History  Chief Complaint  Patient presents with   Shortness of Breath    Pt BIBA for SOB/ 74% on room air when fire dept arrived. Pt hx of COPD. Received albuterol, 2 mags and 2 mg solumedrol by EMS. Pt placed on CPAP by Ems. Pt [id number].     Robert [name] is a 67 y.o. male.  The history is provided by the patient.  Shortness of Breath Hypertension, COPD and was brought in by ambulance because of difficulty breathing.  He started having difficulty breathing last night and it got worse through the day.  He denies any cough, fever, chills, sweats.  He denies chest pain, heaviness, tightness, pressure.  He did use his albuterol inhaler with minimal relief.  EMS reports when they arrived he had almost no air movement.  He was treated with albuterol, ipratropium, methylprednisolone, magnesium and was placed on CPAP with moderate improvement.  He is now moving air and able to talk.  He denies history of heart failure and denies being on any diuretics.   Home Medications Prior to Admission medications   Medication Sig Start Date End Date Taking? Authorizing Provider  aspirin EC 81 MG tablet Take 81 mg by mouth daily.    [provider]  atorvastatin (LIPITOR) 20 MG tablet Take  1 tablet  Daily  for Cholesterol                                            /                     TAKE                          BY                       MOUTH 05/31/21   Unk [name], MD  benazepril-hydrochlorthiazide (LOTENSIN HCT) 20-25 MG tablet TAKE ONE TABLET BY MOUTH DAILY FOR BLOOD PRESSURE AND FLUID RETENTION/ANKLE SWELLING 12/10/21   Alycia Rossetti, NP  Budeson-Glycopyrrol-Formoterol (BREZTRI AEROSPHERE) 160-9-4.8 MCG/ACT AERO Use  2 Inhalations  15 minutes  Apart  2 x /day (every 12 hours) 08/07/21   Unk [name], MD  Cholecalciferol (VITAMIN D) 125 MCG (5000 UT) CAPS Take  by mouth.    [provider]  Magnesium 500 MG TABS Take 500 mg by mouth daily.    [provider]  varenicline (CHANTIX CONTINUING MONTH PAK) 1 MG tablet Take 0.5-1 tablets (0.5-1 mg total) by mouth 2 (two) times daily for smoking cessation 10/05/21   Unk [name], MD  VENTOLIN HFA 108 [phone] Base) MCG/ACT inhaler INHALE TWO PUFFS BY MOUTH INTO LUNGS EVERY 6 HOURS AS NEEDED FOR WHEEZING/SHORTNESS OF BREATH 11/02/21   Alycia Rossetti, NP      Allergies    Codeine and Isovue [iopamidol]    Review of Systems   Review of Systems  Respiratory:  Positive for shortness of breath.   All other systems reviewed and are negative.   Physical Exam Updated Vital Signs BP (!) 165/56 (BP Location: Right Arm)   Pulse (!) 112   Temp 98.6 F (37 C) (Oral)   Resp Marland Kitchen)  32   SpO2 98%  Physical Exam Vitals and nursing note reviewed.   67 year old male on BiPAP. Vital signs are significant for elevated heart rate and blood pressure. Oxygen saturation is 99%, which is normal. Head is normocephalic and atraumatic. PERRLA, EOMI. Oropharynx is clear. Neck is nontender and supple without adenopathy or JVD. Back is nontender and there is no CVA tenderness.  There is trace presacral edema. Lungs have bibasilar rales and faint expiratory wheezes. Chest is nontender. Heart has regular rate and rhythm without murmur. Abdomen is soft, flat, nontender. Extremities have 2+ edema with moderate venous stasis changes bilaterally. Skin is warm and dry without rash. Neurologic: Mental status is normal, cranial nerves are intact, moves all extremities equally.  ED Results / Procedures / Treatments   Labs (all labs ordered are listed, but only abnormal results are displayed) Labs Reviewed  COMPREHENSIVE METABOLIC PANEL  BRAIN NATRIURETIC PEPTIDE  CBC WITH DIFFERENTIAL/PLATELET  TROPONIN I (HIGH SENSITIVITY)    EKG EKG Interpretation  Date/Time:  Monday February 15 2022 00:00:10  EST Ventricular Rate:  116 PR Interval:  177 QRS Duration: 103 QT Interval:  323 QTC Calculation: 449 R Axis:   -80 Text Interpretation: Sinus tachycardia Consider right atrial enlargement Incomplete RBBB and LAFB Probable right ventricular hypertrophy When compared with ECG of 01/29/2018, HEART RATE has increased Confirmed by Delora Fuel ([id number]) on 02/15/2022 12:08:50 AM  Radiology No results found.  Procedures Procedures  Cardiac monitor shows sinus tachycardia, per my interpretation.  Medications Ordered in ED Medications  albuterol (PROVENTIL) (2.5 MG/3ML) 0.083% nebulizer solution 5 mg (has no administration in time range)  ipratropium (ATROVENT) nebulizer solution 0.5 mg (has no administration in time range)    ED Course/ Medical Decision Making/ A&P                             Medical Decision Making Amount and/or Complexity of Data Reviewed Labs: ordered. Radiology: ordered.  Risk OTC drugs. Prescription drug management. Decision regarding hospitalization.   Dyspnea with findings on exam concerning for COPD exacerbation and also for heart failure.  Also consider pneumonia, respiratory viruses such as RSV or COVID-19 or influenza, occult ACS.  Doubt pulmonary embolism.  Current plan is to maintain him on BiPAP while workup proceeds.  I have ordered additional albuterol and ipratropium and I am ordering oral aspirin.  I have reviewed and interpreted his electrocardiogram, and my interpretation is incomplete right bundle branch block, left anterior fascicular block, unchanged from prior except for faster rate.  I have ordered laboratory workup of CBC, comprehensive metabolic panel, BNP, troponin.  I have ordered a chest x-ray.  I have ordered respiratory pathogen panel.  I have reviewed his past records, and he was admitted on 01/29/2018 for COPD exacerbation.  It is noted that he did have CO2 retention during that hospitalization, so I have ordered arterial blood gas for  today.  I have ordered a dose of furosemide.  Chest x-ray showed increased markings in the lung bases which could reflect atelectasis or infection.  I have independently viewed the image, and I agree with radiologist interpretation but I suspect that it actually represents some mild pulmonary edema.  I have reviewed and interpreted his laboratory test, and my interpretation is mild hyponatremia which is not felt to be clinically significant, elevated random glucose which will need to be followed, leukocytosis which is felt to be stress related and not necessarily indicative  of infection, mildly elevated BNP and mildly elevated troponin.  Troponin is elevation is felt to be demand ischemia and not ACS.  Arterial blood gas did show mild respiratory acidosis with probable underlying chronic CO2 retention.  Patient did very well on BiPAP and was resting comfortably and I have ordered BiPAP to be removed and the patient will be watched closely clinically.  Because of question of respiratory infection, I have ordered antibiotics of ceftriaxone and azithromycin.  I have discussed the case with Dr. Sidney Ace of [facility], who agrees to admit the patient.  Of note, he did have an elevated BNP at a prior hospital admission but this was not pursued and he has no echocardiogram on record.  CRITICAL CARE Performed by: Delora Fuel Total critical care time: 60 minutes Critical care time was exclusive of separately billable procedures and treating other patients. Critical care was necessary to treat or prevent imminent or life-threatening deterioration. Critical care was time spent personally by me on the following activities: development of treatment plan with patient and/or surrogate as well as nursing, discussions with consultants, evaluation of patient's response to treatment, examination of patient, obtaining history from patient or surrogate, ordering and performing treatments and interventions, ordering and  review of laboratory studies, ordering and review of radiographic studies, pulse oximetry and re-evaluation of patient's condition.  Final Clinical Impression(s) / ED Diagnoses Final diagnoses:  Acute respiratory failure with hypoxia and hypercapnia (HCC)  COPD exacerbation (HCC)  Elevated brain natriuretic peptide (BNP) level  Elevated troponin  Hyponatremia  Elevated random blood glucose level    Rx / DC Orders ED Discharge Orders     None         Delora Fuel, MD 91/63/84 [phone]

## 2022-02-15 NOTE — Progress Notes (Signed)
Pt placed on BIPAP per MD order. Pt tolerating well at this time

## 2022-02-15 NOTE — Progress Notes (Signed)
Mobility Specialist Progress Note    02/15/22 1501  Mobility  Activity Ambulated independently in hallway  Level of Assistance Standby assist, set-up cues, supervision of patient - no hands on  Assistive Device None  Distance Ambulated (ft) 500 ft  Activity Response Tolerated well  Mobility Referral Yes  $Mobility charge 1 Mobility   Pre-Mobility: 88 HR, 98% SpO2 During Mobility: 102 HR Post-Mobility: 94 HR, 95% SpO2  Pt received in bed and agreeable. No complaints. Encouraged pursed lip breathing. On 4LO2 SpO2 85-89%. Returned to sitting EOB with call bell in reach. Left on 4LO2. RN notified.   Hildred Alamin Mobility Specialist  Please Psychologist, sport and exercise or Rehab Office at [phone]

## 2022-02-15 NOTE — Plan of Care (Signed)

## 2022-02-15 NOTE — Progress Notes (Signed)
  Progress Note   Patient: Robert [name] ZOX:[id number] DOB: 1955-12-11 DOA: 02/14/2022     0 DOS: the patient was seen and examined on 02/15/2022   Brief hospital course:  Assessment and Plan: * COPD exacerbation ([place]) - IV Azithromycin 500 mg daily  - IV ceftriaxone 1 g daily  - Duoneb 4 times daily  - IV solumedrol  40 mg bid  - Tussionex 23m po q12 hr PRN  - Mucinex 600 mg PO bid   CAP (community acquired pneumonia) - Antibx as above  - Management as above   Sepsis due to undetermined organism ([place] - Management as above   Hyponatremia - IV NS 100 cc/hr   Tobacco abuse - Chantix 0.5-1 mg PO bid   Dyslipidemia - ASA 81 mg PO daily  - Lipitor 20 mg PO daily   Essential hypertension - Benazepril 20 mg PO daily   DVT prophylaxis: Lovenox 40 mg sq daily      Subjective: Pt seen and examined at the bedside. He remains wheezy this morning. He shall remain in the hospital for continued antibx and breathing tx's.  Physical Exam: Vitals:   02/15/22 0215 02/15/22 0334 02/15/22 0818 02/15/22 0827  BP: 130/70 127/67  107/64  Pulse: 91 83 72 70  Resp: '20 20 17 17  '$ Temp:  99.1 F (37.3 C)    TempSrc:      SpO2: 94% 95% 97% 97%   Physical Exam Constitutional:      Appearance: He is well-developed.  HENT:     Head: Normocephalic and atraumatic.  Cardiovascular:     Rate and Rhythm: Normal rate and regular rhythm.  Pulmonary:     Breath sounds: Wheezing present.  Abdominal:     Palpations: Abdomen is soft.  Musculoskeletal:     Cervical back: Neck supple.  Skin:    General: Skin is warm.  Neurological:     Mental Status: He is alert and oriented to person, place, and time.  Psychiatric:        Mood and Affect: Mood normal.    Data Reviewed:   Disposition: Status is: Inpatient  Planned Discharge Destination: Home    Time spent: 35 minutes  Author: ZLucienne Minks, MD 02/15/2022 10:18 AM  For on call review [email or web address]

## 2022-02-15 NOTE — Assessment & Plan Note (Signed)
-   We will do statin therapy.

## 2022-02-15 NOTE — Assessment & Plan Note (Addendum)
-   We will continue his antihypertensives. 

## 2022-02-16 DIAGNOSIS — J441 Chronic obstructive pulmonary disease with (acute) exacerbation: Secondary | ICD-10-CM | POA: Diagnosis not present

## 2022-02-16 LAB — CBC
HCT: 45.4 % (ref 39.0–52.0)
Hemoglobin: 14.9 g/dL (ref 13.0–17.0)
MCH: 32.2 pg (ref 26.0–34.0)
MCHC: 32.8 g/dL (ref 30.0–36.0)
MCV: 98.1 fL (ref 80.0–100.0)
Platelets: 208 10*3/uL (ref 150–400)
RBC: 4.63 MIL/uL (ref 4.22–5.81)
RDW: 12.3 % (ref 11.5–15.5)
WBC: 15.5 10*3/uL — ABNORMAL HIGH (ref 4.0–10.5)
nRBC: 0 % (ref 0.0–0.2)

## 2022-02-16 LAB — COMPREHENSIVE METABOLIC PANEL
ALT: 18 U/L (ref 0–44)
AST: 20 U/L (ref 15–41)
Albumin: 2.9 g/dL — ABNORMAL LOW (ref 3.5–5.0)
Alkaline Phosphatase: 62 U/L (ref 38–126)
Anion gap: 9 (ref 5–15)
BUN: 16 mg/dL (ref 8–23)
CO2: 28 mmol/L (ref 22–32)
Calcium: 8.6 mg/dL — ABNORMAL LOW (ref 8.9–10.3)
Chloride: 98 mmol/L (ref 98–111)
Creatinine, Ser: 0.69 mg/dL (ref 0.61–1.24)
GFR, Estimated: >60 mL/min (ref >60)
Glucose, Bld: 144 mg/dL — ABNORMAL HIGH (ref 70–99)
Potassium: 4 mmol/L (ref 3.5–5.1)
Sodium: 135 mmol/L (ref 135–145)
Total Bilirubin: 0.4 mg/dL (ref 0.3–1.2)
Total Protein: 6.5 g/dL (ref 6.5–8.1)

## 2022-02-16 LAB — MAGNESIUM: Magnesium: 2.1 mg/dL (ref 1.7–2.4)

## 2022-02-16 LAB — C-REACTIVE PROTEIN: CRP: 9.9 mg/dL — ABNORMAL HIGH (ref <1.0)

## 2022-02-16 MED ORDER — IPRATROPIUM-ALBUTEROL 0.5-2.5 (3) MG/3ML IN SOLN: 3.0000 mL | RESPIRATORY_TRACT | Status: DC | PRN

## 2022-02-16 MED ORDER — VARENICLINE TARTRATE 1 MG PO TABS
1.0000 mg | ORAL_TABLET | Freq: Two times a day (BID) | ORAL | Status: DC
  Administered 2022-02-16 – 2022-02-17 (×3): 1 mg via ORAL
  Filled 2022-02-16 (×4): qty 1

## 2022-02-16 NOTE — Progress Notes (Signed)
Bipap not indicated at this time. 

## 2022-02-16 NOTE — Progress Notes (Signed)
  Progress Note   Patient: Robert [name] VQX:[id number] DOB: August 05, 1955 DOA: 02/14/2022     1 DOS: the patient was seen and examined on 02/16/2022   Brief hospital course:  Assessment and Plan: * COPD exacerbation ([place]) - IV Azithromycin 500 mg daily  - IV ceftriaxone 2 g daily  - Duoneb q6 hr - IV solumedrol  40 mg bid  - Tussionex 62m po q12 hr PRN  - Mucinex 600 mg PO bid    CAP (community acquired pneumonia) - Antibx as above  - Management as above    Sepsis due to undetermined organism ([place] - Management as above    Hyponatremia - Resolved    Tobacco abuse - Chantix 0.5-1 mg PO bid    Dyslipidemia - ASA 81 mg PO daily  - Lipitor 20 mg PO daily    Essential hypertension - Benazepril 20 mg PO daily    DVT prophylaxis: Lovenox 40 mg sq daily       Subjective: Pt seen and examined at the bedside.He is feeling better today but he is still wheezing. He shall continue in the hospital for IV steroids and breathing tx's.  Physical Exam: Vitals:   02/15/22 1800 02/15/22 2115 02/16/22 0750 02/16/22 0842  BP: 117/71 (!) 109/58 135/68   Pulse: 71 67 64   Resp: '18 20 17   '$ Temp:  97.7 F (36.5 C) 97.8 F (36.6 C)   TempSrc:  Oral    SpO2: 96% 96% 97% 96%   Constitutional:      Appearance: He is well-developed.  HENT:     Head: Normocephalic and atraumatic.  Cardiovascular:     Rate and Rhythm: Normal rate and regular rhythm.  Pulmonary:     Breath sounds: Wheezing present.  Abdominal:     Palpations: Abdomen is soft.  Musculoskeletal:     Cervical back: Neck supple.  Skin:    General: Skin is warm.  Neurological:     Mental Status: He is alert and oriented to person, place, and time.  Psychiatric:        Mood and Affect: Mood normal.   Data Reviewed:   Disposition: Status is: Inpatient  Planned Discharge Destination: Home    Time spent: 35 minutes  Author: ZLucienne Minks, MD 02/16/2022 11:11 AM  For on call review [email or web address]

## 2022-02-16 NOTE — Plan of Care (Signed)
°  Problem: Respiratory: °Goal: Ability to maintain adequate ventilation will improve °Outcome: Progressing °  °

## 2022-02-17 DIAGNOSIS — J441 Chronic obstructive pulmonary disease with (acute) exacerbation: Secondary | ICD-10-CM | POA: Diagnosis not present

## 2022-02-17 LAB — CBC
HCT: 45.3 % (ref 39.0–52.0)
Hemoglobin: 15.2 g/dL (ref 13.0–17.0)
MCH: 33 pg (ref 26.0–34.0)
MCHC: 33.6 g/dL (ref 30.0–36.0)
MCV: 98.5 fL (ref 80.0–100.0)
Platelets: 223 10*3/uL (ref 150–400)
RBC: 4.6 MIL/uL (ref 4.22–5.81)
RDW: 12.3 % (ref 11.5–15.5)
WBC: 13.6 10*3/uL — ABNORMAL HIGH (ref 4.0–10.5)
nRBC: 0 % (ref 0.0–0.2)

## 2022-02-17 LAB — COMPREHENSIVE METABOLIC PANEL
ALT: 24 U/L (ref 0–44)
AST: 22 U/L (ref 15–41)
Albumin: 2.8 g/dL — ABNORMAL LOW (ref 3.5–5.0)
Alkaline Phosphatase: 53 U/L (ref 38–126)
Anion gap: 3 — ABNORMAL LOW (ref 5–15)
BUN: 14 mg/dL (ref 8–23)
CO2: 33 mmol/L — ABNORMAL HIGH (ref 22–32)
Calcium: 8.4 mg/dL — ABNORMAL LOW (ref 8.9–10.3)
Chloride: 97 mmol/L — ABNORMAL LOW (ref 98–111)
Creatinine, Ser: 0.58 mg/dL — ABNORMAL LOW (ref 0.61–1.24)
GFR, Estimated: >60 mL/min (ref >60)
Glucose, Bld: 161 mg/dL — ABNORMAL HIGH (ref 70–99)
Potassium: 4.6 mmol/L (ref 3.5–5.1)
Sodium: 133 mmol/L — ABNORMAL LOW (ref 135–145)
Total Bilirubin: 0.4 mg/dL (ref 0.3–1.2)
Total Protein: 6 g/dL — ABNORMAL LOW (ref 6.5–8.1)

## 2022-02-17 LAB — MAGNESIUM: Magnesium: 2 mg/dL (ref 1.7–2.4)

## 2022-02-17 LAB — C-REACTIVE PROTEIN: CRP: 3.5 mg/dL — ABNORMAL HIGH (ref <1.0)

## 2022-02-17 MED ORDER — AMOXICILLIN-POT CLAVULANATE 875-125 MG PO TABS: 1.0000 | ORAL_TABLET | Freq: Two times a day (BID) | ORAL | 0 refills | Status: AC

## 2022-02-17 MED ORDER — ALBUTEROL SULFATE HFA 108 (90 BASE) MCG/ACT IN AERS: 2.0000 | INHALATION_SPRAY | Freq: Four times a day (QID) | RESPIRATORY_TRACT | 0 refills | Status: AC | PRN

## 2022-02-17 MED ORDER — AZITHROMYCIN 500 MG PO TABS: 500.0000 mg | ORAL_TABLET | Freq: Every day | ORAL | 0 refills | Status: AC

## 2022-02-17 MED ORDER — GUAIFENESIN ER 600 MG PO TB12: 600.0000 mg | ORAL_TABLET | Freq: Two times a day (BID) | ORAL | 0 refills | Status: AC | PRN

## 2022-02-17 MED ORDER — PREDNISONE 20 MG PO TABS: 20.0000 mg | ORAL_TABLET | Freq: Two times a day (BID) | ORAL | 0 refills | Status: AC

## 2022-02-17 NOTE — Progress Notes (Signed)
SATURATION QUALIFICATIONS: (This note is used to comply with regulatory documentation for home oxygen)  Patient Saturations on Room Air at Rest = 92%  Patient Saturations on Room Air while Ambulating = 78%  Patient Saturations on 2 Liters of oxygen while Ambulating = 88%  Please briefly explain why patient needs home oxygen: patient's oxygenation requirements increase when ambulating and needs supplemental oxygenation to safely discharge

## 2022-02-17 NOTE — Progress Notes (Signed)
Mobility Specialist Progress Note    02/17/22 1037  Mobility  Activity Ambulated independently in hallway  Level of Assistance Standby assist, set-up cues, supervision of patient - no hands on  Assistive Device None  Distance Ambulated (ft) 400 ft  Activity Response Tolerated fair  Mobility Referral Yes  $Mobility charge 1 Mobility   Pre-Mobility: 67 HR, 92% SpO2 During Mobility: 107 HR Post-Mobility: 73 HR, 90% SpO2  Pt received in bed and agreeable. On RA during, SpO2 as low as 78% w/ good pleth. Encouraged pursed lip breathing and took a few short standing rest breaks. SpO2 in 80s throughout session. Returned to sitting EOB. Encouraged incentive spirometer use. Left on RA at rest.   Hildred Alamin Mobility Specialist  Please Contact via SecureChat or Rehab Office at [phone]

## 2022-02-17 NOTE — Discharge Summary (Signed)
Physician Discharge Summary   Patient: Robert [name] MRN: [id number] DOB: 03/08/1955  Admit date:     02/14/2022  Discharge date: 02/17/22  Discharge Physician: Lucienne Minks    PCP: Unk [name], MD   Recommendations at discharge:    Please complete the antibiotics and steroid course at home   Discharge Diagnoses: Principal Problem:   COPD exacerbation ([place]) Active Problems:   CAP (community acquired pneumonia)   Sepsis due to undetermined organism ([place])   Hyponatremia   Tobacco abuse   Essential hypertension   Dyslipidemia   Acute respiratory failure with hypoxia and hypercapnia ([place])  Resolved Problems:   * No resolved hospital problems. *  Hospital Course: 67 yo M treated for COPD exacerbation and CAP. Pt received IV Azithromycin 500 mg daily, IV ceftriaxone 2 g daily, Duoneb q6 hr, IV solumedrol  40 mg bid, Tussionex 68m po q12 hr PRN and Mucinex 600 mg PO bid. WBC and CRP both downtrended. Wheezing improved with the aforementioned treatment. HypoNa+ also improved with IV fluids. Pt was evalauted for home oxygen on 02/17/2022 and he did meet criteria for home oxygen. Pt will be going home with 2L [place]. No smoking or open flames around the oxygen tanks as they can explode and catch fire. Pt should complete the course of oral antibx and oral steroids at home.      Disposition: Home Diet recommendation:  Cardiac diet DISCHARGE MEDICATION: Allergies as of 02/17/2022       Reactions   Codeine Other (See Comments)   REACTION: Itching   Isovue [iopamidol] Hives   Pt. Developed 1 hive after injection; Dr. SKris Hartmannspoke with patient;recommends 13 hr prep in the future        Medication List     TAKE these medications    albuterol 108 (90 Base) MCG/ACT inhaler Commonly known as: VENTOLIN HFA Inhale 2 puffs into the lungs every 6 (six) hours as needed for wheezing or shortness of breath.   amoxicillin-clavulanate 875-125 MG tablet Commonly known as: AUGMENTIN Take 1 tablet  by mouth 2 (two) times daily for 5 days.   aspirin EC 81 MG tablet Take 81 mg by mouth daily.   atorvastatin 20 MG tablet Commonly known as: LIPITOR Take  1 tablet  Daily  for Cholesterol                                            /                     TAKE                          BY                       MOUTH What changed:  how much to take how to take this when to take this additional instructions   azithromycin 500 MG tablet Commonly known as: Zithromax Take 1 tablet (500 mg total) by mouth daily for 3 days. Take 1 tablet daily for 3 days.   benazepril-hydrochlorthiazide 20-25 MG tablet Commonly known as: LOTENSIN HCT TAKE ONE TABLET BY MOUTH DAILY FOR BLOOD PRESSURE AND FLUID RETENTION/ANKLE SWELLING   Breztri Aerosphere 160-9-4.8 MCG/ACT Aero Generic drug: Budeson-Glycopyrrol-Formoterol Use  2 Inhalations  15 minutes  Apart  2 x /day (every  12 hours) What changed:  how much to take how to take this when to take this additional instructions   guaiFENesin 600 MG 12 hr tablet Commonly known as: MUCINEX Take 1 tablet (600 mg total) by mouth 2 (two) times daily as needed for up to 5 days for cough or to loosen phlegm.   Magnesium 500 MG Tabs Take 500 mg by mouth daily.   predniSONE 20 MG tablet Commonly known as: DELTASONE Take 1 tablet (20 mg total) by mouth 2 (two) times daily with a meal for 5 days.   varenicline 1 MG tablet Commonly known as: Chantix Continuing Month Pak Take 0.5-1 tablets (0.5-1 mg total) by mouth 2 (two) times daily for smoking cessation What changed: how much to take   Vitamin D 125 MCG (5000 UT) Caps Take 5,000 Units by mouth daily.               Durable Medical Equipment  (From admission, onward)           Start     Ordered   02/17/22 1106  For home use only DME oxygen  Once       Comments: He is interested in the portable oxygen tanks  Question Answer Comment  Length of Need Lifetime   Mode or (Route) Nasal cannula    Liters per Minute 2   Frequency Continuous (stationary and portable oxygen unit needed)   Oxygen conserving device Yes   Oxygen delivery system Gas      02/17/22 1106            Follow-up Information     Unk [name], MD Follow up.   Specialty: Internal Medicine Contact information: [address] [place] [address] [phone]                Discharge Exam: There were no vitals filed for this visit.  Condition at discharge: fair  The results of significant diagnostics from this hospitalization (including imaging, microbiology, ancillary and laboratory) are listed below for reference.   Imaging Studies: DG Chest Port 1 View  Result Date: 02/15/2022 CLINICAL DATA:  Shortness of breath EXAM: PORTABLE CHEST 1 VIEW COMPARISON:  01/29/2018 FINDINGS: Heart is normal size. Increased markings in the lower lobes bilaterally. No visible effusions. Mediastinal contours within normal limits. No acute bony abnormality. IMPRESSION: Increased markings in the lung bases could reflect atelectasis or infection Electronically Signed   By: Rolm Baptise M.D.   On: 02/15/2022 00:40    Microbiology: Results for orders placed or performed during the hospital encounter of 02/14/22  Resp panel by RT-PCR (RSV, Flu A&B, Covid) Anterior Nasal Swab     Status: None   Collection Time: 02/15/22 12:43 AM   Specimen: Anterior Nasal Swab  Result Value Ref Range Status   SARS Coronavirus 2 by RT PCR NEGATIVE NEGATIVE Final   Influenza A by PCR NEGATIVE NEGATIVE Final   Influenza B by PCR NEGATIVE NEGATIVE Final    Comment: (NOTE) The Xpert Xpress SARS-CoV-2/FLU/RSV plus assay is intended as an aid in the diagnosis of influenza from Nasopharyngeal swab specimens and should not be used as a sole basis for treatment. Nasal washings and aspirates are unacceptable for Xpert Xpress SARS-CoV-2/FLU/RSV testing.  Fact Sheet for  Patients: EntrepreneurPulse.com.au  Fact Sheet for Healthcare Providers: IncredibleEmployment.be  This test is not yet approved or cleared by the [place] FDA and has been authorized for detection and/or diagnosis of SARS-CoV-2 by FDA under an Emergency Use  Authorization (EUA). This EUA will remain in effect (meaning this test can be used) for the duration of the COVID-19 declaration under Section 564(b)(1) of the Act, 21 U.S.C. section 360bbb-3(b)(1), unless the authorization is terminated or revoked.     Resp Syncytial Virus by PCR NEGATIVE NEGATIVE Final    Comment: (NOTE) Fact Sheet for Patients: EntrepreneurPulse.com.au  Fact Sheet for Healthcare Providers: IncredibleEmployment.be  This test is not yet approved or cleared by the [place] FDA and has been authorized for detection and/or diagnosis of SARS-CoV-2 by FDA under an Emergency Use Authorization (EUA). This EUA will remain in effect (meaning this test can be used) for the duration of the COVID-19 declaration under Section 564(b)(1) of the Act, 21 U.S.C. section 360bbb-3(b)(1), unless the authorization is terminated or revoked.  Performed at [facility], [place] [address] [place], [place] [address]     Labs: CBC: Recent Labs  Lab 02/15/22 0006 02/15/22 0039 02/15/22 0436 02/16/22 0329 02/17/22 0411  WBC 16.5*  --  14.2* 15.5* 13.6*  NEUTROABS 12.0*  --   --   --   --   HGB 16.0 16.0 15.5 14.9 15.2  HCT 49.7 47.0 45.7 45.4 45.3  MCV 99.8  --  96.6 98.1 98.5  PLT 215  --  189 208 446   Basic Metabolic Panel: Recent Labs  Lab 02/15/22 0006 02/15/22 0039 02/15/22 0436 02/16/22 0329 02/17/22 0411  NA 129* 131* 131* 135 133*  K 4.1 3.5 4.0 4.0 4.6  CL 93*  --  94* 98 97*  CO2 21*  --  28 28 33*  GLUCOSE 184*  --  177* 144* 161*  BUN 11  --  '10 16 14  '$ CREATININE 0.72  --  0.76 0.69 0.58*  CALCIUM 8.6*  --  8.3*  8.6* 8.4*  MG  --   --   --  2.1 2.0   Liver Function Tests: Recent Labs  Lab 02/15/22 0006 02/16/22 0329 02/17/22 0411  AST '23 20 22  '$ ALT '19 18 24  '$ ALKPHOS 68 62 53  BILITOT 1.4* 0.4 0.4  PROT 7.3 6.5 6.0*  ALBUMIN 3.5 2.9* 2.8*   CBG: No results for input(s): "GLUCAP" in the last 168 hours.  Discharge time spent: greater than 30 minutes.  Signed: Lucienne Minks , MD [facility] 02/17/2022

## 2022-02-17 NOTE — TOC Initial Note (Signed)
Transition of Care [facility]) - Initial/Assessment Note    Patient Details  Name: Robert [name] MRN: [id number] Date of Birth: 1955-07-05  Transition of [facility] CM/SW Contact:    Sharin Mons, RN Phone Number: 02/17/2022, 2:46 PM  Clinical Narrative:                 Presents with COPD exacerbation.From home with son. PTA independent with ADL's , no DME usage. NCM received consult: Please evaluate  for home oxygen. Referral made with [employer] for home oxygen. Equipment will be delivered to bedside prior to d/c.  Toc team following for needs...  Expected Discharge Plan: Home/Self Care     Patient Goals and CMS Choice     Choice offered to / list presented to : Patient      Expected Discharge Plan and Services                         DME Arranged: Oxygen DME Agency: [employer] Date DME Agency Contacted: 02/17/22 Time DME Agency Contacted: [id number] Representative spoke with at DME Agency: Erasmo Downer            Prior Living Arrangements/Services                       Activities of Daily Living Home Assistive Devices/Equipment: None ADL Screening (condition at time of admission) Patient's cognitive ability adequate to safely complete daily activities?: Yes Is the patient deaf or have difficulty hearing?: No Does the patient have difficulty seeing, even when wearing glasses/contacts?: No Does the patient have difficulty concentrating, remembering, or making decisions?: No Patient able to express need for assistance with ADLs?: Yes Does the patient have difficulty dressing or bathing?: No Independently performs ADLs?: Yes (appropriate for developmental age) Does the patient have difficulty walking or climbing stairs?: No Weakness of Legs: None Weakness of Arms/Hands: None  Permission Sought/Granted                  Emotional Assessment              Admission diagnosis:  Hyponatremia [E87.1] Elevated troponin [R79.89] COPD exacerbation  (HCC) [J44.1] Elevated brain natriuretic peptide (BNP) level [R79.89] Elevated random blood glucose level [R73.09] Acute respiratory failure with hypoxia and hypercapnia ([place]) [J96.01, J96.02] Patient Active Problem List   Diagnosis Date Noted   COPD exacerbation ([place]) 02/15/2022   CAP (community acquired pneumonia) 02/15/2022   Sepsis due to undetermined organism ([place]) 02/15/2022   Dyslipidemia 02/15/2022   Acute respiratory failure with hypoxia and hypercapnia ([place]) 02/15/2022   Hyponatremia 02/15/2022   FH: hypertension 09/19/2017   COPD, 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 [place] [address] Phone: [phone] Fax: [phone]     Social Determinants of Health (SDOH) Social History: SDOH Screenings   Food Insecurity: No Food Insecurity (02/16/2022)  Housing: Low Risk  (02/16/2022)  Transportation Needs: No Transportation Needs (02/16/2022)  Utilities:  Not At Risk (02/16/2022)  Depression (PHQ2-9): Low Risk  (03/30/2021)  Tobacco Use: High Risk (02/15/2022)   SDOH Interventions:     Readmission Risk Interventions     No data to display

## 2022-02-17 NOTE — Plan of Care (Signed)
  Problem: Fluid Volume: Goal: Hemodynamic stability will improve Outcome: Progressing   Problem: Clinical Measurements: Goal: Diagnostic test results will improve Outcome: Progressing Goal: Signs and symptoms of infection will decrease Outcome: Progressing   Problem: Respiratory: Goal: Ability to maintain adequate ventilation will improve Outcome: Progressing   Problem: Education: Goal: Knowledge of General Education information will improve Description: Including pain rating scale, medication(s)/side effects and non-pharmacologic comfort measures Outcome: Progressing   Problem: Health Behavior/Discharge Planning: Goal: Ability to manage health-related needs will improve Outcome: Progressing   Problem: Clinical Measurements: Goal: Ability to maintain clinical measurements within normal limits will improve Outcome: Progressing Goal: Will remain free from infection Outcome: Progressing Goal: Diagnostic test results will improve Outcome: Progressing Goal: Respiratory complications will improve Outcome: Progressing Goal: Cardiovascular complication will be avoided Outcome: Progressing   Problem: Activity: Goal: Risk for activity intolerance will decrease Outcome: Progressing

## 2022-03-02 ENCOUNTER — Encounter: Payer: Self-pay | Admitting: Nurse Practitioner

## 2022-03-02 ENCOUNTER — Ambulatory Visit: Payer: BC Managed Care – PPO | Admitting: Nurse Practitioner

## 2022-03-02 VITALS — BP 118/64 | HR 68 | Temp 97.3°F | Ht 66.0 in | Wt 257.0 lb

## 2022-03-02 DIAGNOSIS — Z720 Tobacco use: Secondary | ICD-10-CM

## 2022-03-02 DIAGNOSIS — J189 Pneumonia, unspecified organism: Secondary | ICD-10-CM

## 2022-03-02 DIAGNOSIS — J441 Chronic obstructive pulmonary disease with (acute) exacerbation: Secondary | ICD-10-CM | POA: Diagnosis not present

## 2022-03-02 DIAGNOSIS — Z09 Encounter for follow-up examination after completed treatment for conditions other than malignant neoplasm: Secondary | ICD-10-CM

## 2022-03-02 DIAGNOSIS — A419 Sepsis, unspecified organism: Secondary | ICD-10-CM

## 2022-03-02 DIAGNOSIS — Z79899 Other long term (current) drug therapy: Secondary | ICD-10-CM

## 2022-03-02 DIAGNOSIS — E871 Hypo-osmolality and hyponatremia: Secondary | ICD-10-CM

## 2022-03-02 NOTE — Patient Instructions (Signed)
How to Use a Nebulizer, Adult  A nebulizer is a device that turns liquid medicine into a mist or vapor that you can breathe in (inhale). This medicine helps to open the air passages in your lungs. You may need to use a nebulizer if you have an acute breathing illness, such as pneumonia. A nebulizer may also be used to treat chronic conditions, such as asthma or chronic obstructive pulmonarydisease (COPD). There are different kinds of nebulizers. With some nebulizers, you breathe in medicine through a mouthpiece. With others, you get medicine through a mask that fits over your nose and mouth. What are the risks? If you use a nebulizer that does not fit right or is not cleaned properly, it can cause some problems, including: Infection. Eye irritation. Delivery of too much medicine or not enough medicine. Mouth irritation. Supplies needed: Air compressor (nebulizer machine). Nebulizer medicine cup (reservoir)and tubing. Mouthpiece or face mask. Soap and water. Sterile or distilled water. Clean towel. How to use a nebulizer     Preparing a nebulizer Take these steps before using your nebulizer: Read the manufacturer's instructions for your nebulizer, as machines vary. Check your medicine. Make sure it has not expired and is not damaged in any way. Wash your hands with soap and water. Put all of the parts of your nebulizer on a sturdy, flat surface. Connect the tubing to the nebulizer machine and to the reservoir. Measure the liquid medicine according to instructions from your health care provider. Pour the liquid into the reservoir. Attach the mouthpiece or mask. Test the nebulizer by turning it on to make sure that a spray comes out. Then, turn it off. Using a nebulizer Be sure to stop the machine at any time if you start coughing or if the medicine foams or bubbles. Sit in an upright, relaxed position. If your nebulizer has a mask, put it over your nose and mouth. It should fit  somewhat snugly, with no gaps around the nose or cheeks where medicine could escape. If you use a mouthpiece, put it in your mouth. Press your lips firmly around the mouthpiece. Turn on the nebulizer. Some nebulizers have a finger valve. If yours does, cover up the air hole so the air gets to the nebulizer. Once the medicine begins to mist out, take slow, deep breaths. If there is a finger valve, release it at the end of your breath. Continue taking slow, deep breaths until the medicine in the nebulizer is gone and no mist appears. Cleaning a nebulizer The nebulizer and all of its parts must be kept very clean. If the nebulizer and its parts are not cleaned properly, bacteria can grow inside of them. If you inhale the bacteria, you can get sick. Follow the manufacturer's instructions for cleaning your nebulizer. For most nebulizers, you should follow these guidelines: Clean the mouthpiece or mask and the reservoir by: Rinsing them after each use. Use sterile or distilled water. Washing them 1-2 times a week using soap and warm water. Do not wash the tubing. After you rinse or wash them, place the parts on a clean towel and let them air-dry completely. After they dry, reconnect the pieces and turn the nebulizer on without any medicine in it. Doing this will blow air through the equipment to help dry it out. Store the nebulizer in a clean and dust-free place. Check the filter at least one time every week. Replace the filter if it looks dirty. Follow these instructions at home Use your nebulizer  only as told by your health care provider. Do not use the nebulizer more than directed by your health care provider. Do not use any products that contain nicotine or tobacco, such as cigarettes, e-cigarettes, and chewing tobacco. If you need help quitting, ask your health care provider. Keep all follow-up visits as told by your health care provider. This is important. Where to find more information Allergy &  Asthma Network: allergyasthmanetwork.org American Lung Association: www.lung.org Contact a health care provider if: You have trouble using the nebulizer. Your nebulizer foams or stops working. Your nebulizer does not create a mist after you add medicine and turn it on. Get help right away if: You continue to have trouble breathing. Your breathing gets worse during a nebulizer treatment. These symptoms may represent a serious problem that is an emergency. Do not wait to see if the symptoms will go away. Get medical help right away. Call your local emergency services (911 in the [place] Do not drive yourself to the hospital. Summary A nebulizer is a device that turns liquid medicine into a mist (vapor) that you can breathe in (inhale). Measure the liquid medicine according to instructions from your health care provider. Pour the liquid into the part of the nebulizer that holds the medicine (reservoir). Once the medicine begins to mist out, take slow, deep breaths. Rinse or wash the mouthpiece or mask and the reservoir after each use, and allow them to air-dry completely. This information is not intended to replace advice given to you by your health care provider. Make sure you discuss any questions you have with your health care provider. Document Revised: 09/10/2019 Document Reviewed: 02/07/2019 Elsevier Patient Education  [place].

## 2022-03-02 NOTE — Progress Notes (Signed)
Hospital follow up  Assessment and Plan: Hospital visit follow up for:   Hospital discharge follow-up Reviewed discharge instructions in full including medication changes, diagnostics, labs, and future follow ups appointment. All questions and concerns addressed.   - CBC with Differential/Platelet - COMPLETE METABOLIC PANEL WITH GFR  COPD exacerbation (HCC) Resolved Continue Ventolin. Change Breztri to Trelegy considering use of SABA Albuterol > x2 weekly Instructed to rinse mouth after each use for prevention of oral candidiasis.   Community acquired pneumonia, unspecified laterality Resolving CXR pending to assess for resolution of underlying disease process.   Sepsis due to undetermined organism ([place]) Resolving. Continue to monitor for fever, chills, N/V. Contact office if noticed.  - CBC with Differential/Platelet  Hyponatremia  - COMPLETE METABOLIC PANEL WITH GFR  Tobacco abuse Continue Chantix as directed. Smoking cessation instruction/counseling given:  counseled patient on the dangers of tobacco use, advised patient to stop smoking, and reviewed strategies to maximize success  Medication management All medications discussed and reviewed in full. All questions and concerns regarding medications addressed.    - CBC with Differential/Platelet - COMPLETE METABOLIC PANEL WITH GFR   All medications were reviewed with patient and fully reconciled. All questions answered fully, and patient and family members were encouraged to call the office with any further questions or concerns. Discussed goal to avoid readmission related to this diagnosis.   Over 40 minutes of exam, counseling, chart review, and complex, high/moderate level critical decision making was performed this visit.   Future Appointments  Date Time Provider [place]  04/06/2022 10:00 AM Unk [name], MD [facility] None     HPI 67 y.o.male presents for follow up for transition from recent  hospitalization or SNIF stay. Admit date to the hospital was 02/14/22, patient was discharged from the hospital on 02/17/22 and our clinical staff contacted the office the day after discharge to set up a follow up appointment. The discharge summary, medications, and diagnostic test results were reviewed before meeting with the patient. The patient was admitted for CAP.   Patient's medical history significant for COPD, osteoarthritis and essential hypertension, who presented to the emergency room with acute onset of progressive dyspnea for 24 hours onset 02/14/22.  EMS was contacted and patient transported to ER.  EMS reports that the patient was hardly moving air initially it was noted to have bibasilar rales and expiratory wheezes.  He was given DuoNebs, magnesium sulfate and IV Solu-Medrol.  He admitted to ER with dyspnea and associated dry cough and wheezing.  He had been having tactile fever and chills.  He has not been vaccinated for COVID-19.  Denied hemoptysis.  Denied nausea or vomiting or abdominal pain.  He continues to smoke 5 to 6 cigarettes/day.  He denied any orthopnea or paroxysmal nocturnal dyspnea or worsening lower extremity edema.   ER BP was 165/86 with heart rate 115 and respiratory rate of 23 with pulse currently of 99% on CPAP at 50% FiO2 and later on on BiPAP. ABG showed pH 7.303, pCO2 58.8 and pO2 of 117 with HCO3 29 and O2 saturation of 98%. BMP showed hyponatremia 129 and hypochloremia of 93 with a CO2 of 21 and blood glucose of 184, total bili 1.4 and otherwise unremarkable CMP. High sensitive troponin was 24 and BNP was 114.7 CBC showed leukocytosis 16.5 with neutrophilia. Influenza, RSV and COVID-19 PCR came back negative.   EKG showed sinus tachycardia with rate of 116 with suspected right atrial enlargement, incomplete right bundle branch block and left anterior  fascicular block with probable RVH.   Chest x-ray showed increased markings in the lung bases that reflect atelectasis or  infection.   The patient was given IV Rocephin and Zithromax, 40 mg of IV Lasix nebulized bronchodilator therapy and admitted to a medical telemetry bed.  He was treated for COPD exacerbation: IV steroid therapy with IV Solu-Medrol as well as nebulized bronchodilator therapy with duonebs q.i.d. and q.4 hours p.r.n.Marland Kitchen - Mucolytic therapy was provided with Mucinex and antibiotic therapy with IV Rocephin and Zithromax.  CAP: - The patient was placed on IV Rocephin and Zithromax. - Followed blood cultures. - Mucolytic therapy and bronchodilator therapy was provided.  Sepsis manifested by tachycardia, tachypnea and leukocytosis, clearly secondary to pneumonia. - IV hydration administered.  Hyponatremia -noted to be r/t hypovolemic -IV hydration and follow BMPs  During today's visit he admits to completing his oral antibiotics  upon discharge.  Has been using Breztri inhaler with >2 x weekly SABA with occasional DOE.  He does have a nebulizer machine at home but need a mask.  He will order one from [place].    Overall he reports feeling well.     Home health is not involved.   Images while in the hospital: [facility] Chest Port 1 View  Result Date: 02/15/2022 CLINICAL DATA:  Shortness of breath EXAM: PORTABLE CHEST 1 VIEW COMPARISON:  01/29/2018 FINDINGS: Heart is normal size. Increased markings in the lower lobes bilaterally. No visible effusions. Mediastinal contours within normal limits. No acute bony abnormality. IMPRESSION: Increased markings in the lung bases could reflect atelectasis or infection Electronically Signed   By: Rolm Baptise M.D.   On: 02/15/2022 00:40      Current Outpatient Medications (Cardiovascular):    atorvastatin (LIPITOR) 20 MG tablet, Take  1 tablet  Daily  for Cholesterol                                            /                     TAKE                          BY                       MOUTH (Patient taking differently: Take 20 mg by mouth daily. For  cholesterol)   benazepril-hydrochlorthiazide (LOTENSIN HCT) 20-25 MG tablet, TAKE ONE TABLET BY MOUTH DAILY FOR BLOOD PRESSURE AND FLUID RETENTION/ANKLE SWELLING  Current Outpatient Medications (Respiratory):    albuterol (VENTOLIN HFA) 108 (90 Base) MCG/ACT inhaler, Inhale 2 puffs into the lungs every 6 (six) hours as needed for wheezing or shortness of breath.   Budeson-Glycopyrrol-Formoterol (BREZTRI AEROSPHERE) 160-9-4.8 MCG/ACT AERO, Use  2 Inhalations  15 minutes  Apart  2 x /day (every 12 hours) (Patient taking differently: Inhale 2 puffs into the lungs every 12 (twelve) hours.)  Current Outpatient Medications (Analgesics):    aspirin EC 81 MG tablet, Take 81 mg by mouth daily.   Current Outpatient Medications (Other):    Cholecalciferol (VITAMIN D) 125 MCG (5000 UT) CAPS, Take 5,000 Units by mouth daily.   Magnesium 500 MG TABS, Take 500 mg by mouth daily.   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 Developed 1 hive after injection; Dr. Kris Hartmann spoke with patient;recommends 13 hr prep in the future    ROS: all negative except above.   Physical Exam: Filed Weights   03/02/22 1605  Weight: 257 lb (116.6 kg)   BP 118/64   Pulse 68   Temp (!) 97.3 F (36.3 C)   Ht 5' 6"$  (1.676 m)   Wt 257 lb (116.6 kg)   SpO2 92%   BMI 41.48 kg/m  General Appearance: Well nourished, in no apparent  distress. Eyes: PERRLA, EOMs, conjunctiva no swelling or erythema Sinuses: No Frontal/maxillary tenderness ENT/Mouth: Ext aud canals clear, TMs without erythema, bulging. No erythema, swelling, or exudate on post pharynx.  Tonsils not swollen or erythematous. Hearing normal.  Neck: Supple, thyroid normal.  Respiratory: Respiratory effort normal, BS equal bilaterally without rales, rhonchi, wheezing or stridor.  Cardio: RRR with no MRGs. Brisk peripheral pulses without edema.  Abdomen: Soft, + BS.  Non tender, no guarding, rebound, hernias, masses. Lymphatics: Non tender without lymphadenopathy.  Musculoskeletal: Full ROM, 5/5 strength, normal gait.  Skin: Warm, dry without rashes, lesions, ecchymosis.  Neuro: Cranial nerves intact. Normal muscle tone, no cerebellar symptoms. Sensation intact.  Psych: Awake and oriented X 3, normal affect, Insight and Judgment appropriate.     Darrol Jump, NP 4:31 PM [facility]

## 2022-03-03 LAB — CBC WITH DIFFERENTIAL/PLATELET
Absolute Monocytes: 1288 cells/uL — ABNORMAL HIGH (ref 200–950)
Basophils Absolute: 45 cells/uL (ref 0–200)
Basophils Relative: 0.4 %
Eosinophils Absolute: 136 cells/uL (ref 15–500)
Eosinophils Relative: 1.2 %
HCT: 45 % (ref 38.5–50.0)
Hemoglobin: 15.5 g/dL (ref 13.2–17.1)
Lymphs Abs: 2350 cells/uL (ref 850–3900)
MCH: 33.1 pg — ABNORMAL HIGH (ref 27.0–33.0)
MCHC: 34.4 g/dL (ref 32.0–36.0)
MCV: 96.2 fL (ref 80.0–100.0)
MPV: 9.5 fL (ref 7.5–12.5)
Monocytes Relative: 11.4 %
Neutro Abs: 7481 cells/uL (ref 1500–7800)
Neutrophils Relative %: 66.2 %
Platelets: 212 10*3/uL (ref 140–400)
RBC: 4.68 10*6/uL (ref 4.20–5.80)
RDW: 11.9 % (ref 11.0–15.0)
Total Lymphocyte: 20.8 %
WBC: 11.3 10*3/uL — ABNORMAL HIGH (ref 3.8–10.8)

## 2022-03-03 LAB — COMPLETE METABOLIC PANEL WITH GFR
AG Ratio: 1.6 (calc) (ref 1.0–2.5)
ALT: 23 U/L (ref 9–46)
AST: 17 U/L (ref 10–35)
Albumin: 3.9 g/dL (ref 3.6–5.1)
Alkaline phosphatase (APISO): 60 U/L (ref 35–144)
BUN: 17 mg/dL (ref 7–25)
CO2: 31 mmol/L (ref 20–32)
Calcium: 9.1 mg/dL (ref 8.6–10.3)
Chloride: 97 mmol/L — ABNORMAL LOW (ref 98–110)
Creat: 0.77 mg/dL (ref 0.70–1.35)
Globulin: 2.5 g/dL (calc) (ref 1.9–3.7)
Glucose, Bld: 116 mg/dL — ABNORMAL HIGH (ref 65–99)
Potassium: 4.1 mmol/L (ref 3.5–5.3)
Sodium: 135 mmol/L (ref 135–146)
Total Bilirubin: 0.4 mg/dL (ref 0.2–1.2)
Total Protein: 6.4 g/dL (ref 6.1–8.1)
eGFR: 99 mL/min/{1.73_m2} (ref >=60)

## 2022-03-18 DIAGNOSIS — J441 Chronic obstructive pulmonary disease with (acute) exacerbation: Secondary | ICD-10-CM | POA: Diagnosis not present

## 2022-03-18 DIAGNOSIS — A419 Sepsis, unspecified organism: Secondary | ICD-10-CM | POA: Diagnosis not present

## 2022-03-18 DIAGNOSIS — J189 Pneumonia, unspecified organism: Secondary | ICD-10-CM | POA: Diagnosis not present

## 2022-04-05 ENCOUNTER — Encounter: Payer: Self-pay | Admitting: Internal Medicine

## 2022-04-05 NOTE — Patient Instructions (Signed)

## 2022-04-05 NOTE — Progress Notes (Unsigned)
Annual  Screening/Preventative Visit  & Comprehensive Evaluation & Examination   Future Appointments  Date Time Provider Department  04/06/2022 10:00 AM Unk [name], MD [facility]  04/07/2023 10:00 AM Unk [name], MD [facility]            This very nice 67 y.o. WWM  presents for a Screening /Preventative Visit & comprehensive evaluation and management of multiple medical co-morbidities.  Patient has been followed for HTN, HLD, Prediabetes and Vitamin D Deficiency.   In June, 2018, patient  underwent a Robotic Prostatectomy for Ca .         Patient has severe COPD & has hospitalized about 6 weeks ago with CAP & exacerbation of chronic bronchitis. He was discharged home & weaned off of Oxygen.         HTN predates circa 2004. Patient's BP has been controlled at home.  Today's BP was initially elevated & rechecked at  goal  -   138/84  .    Patient denies any cardiac symptoms as chest pain, palpitations, shortness of breath, dizziness or ankle swelling.       Patient's hyperlipidemia is controlled with diet and Atorvastatin.   Patient denies myalgias or other medication SE's. Last lipids were at goal :  Lab Results  Component Value Date   CHOL 154 10/05/2021   HDL 60 10/05/2021   LDLCALC 73 10/05/2021   TRIG 130 10/05/2021   CHOLHDL 2.6 10/05/2021         Patient has hx/o  Morbid Obesity  (BMI 36+) and consequent prediabetes (A1c 5.7%/2009 & 5.8%/2012)  and patient denies reactive hypoglycemic symptoms, visual blurring, diabetic polys or paresthesias. Last A1c was at goal :   Lab Results  Component Value Date   HGBA1C 5.6 10/05/2021         Finally, patient has history of Vitamin D Deficiency ("21"/2008) and last vitamin D was at goal :   Lab Results  Component Value Date   VD25OH 63 10/05/2021       Current Outpatient Medications on File Prior to Visit  Medication Sig   albuterol HFA  inhaler Inhale 2 puffs  every 6  hours as needed    aspirin EC 81 MG  tablet Take  daily.   atorvastatin 20 MG tablet TAKE ONE TABLET DAILY   benazepril-hctz 20-25 MG tablet Take  1 tablet  Daily   BREZTRI 160-9-4.8  Use  2 Inhalations   2 x /day (every 12 hours)   Magnesium 500 MG TABS Take  daily.     Allergies  Allergen Reactions   Codeine Other (See Comments)    REACTION: Itching   Isovue [Iopamidol] Hives    Pt. Developed 1 hive after injection; Dr. 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   COPD (chronic obstructive pulmonary disease) (HCC)    Elevated hemoglobin A1c    Elevated PSA 03/16/2016   Hypertension    Personal history of colonic adenomas 12/28/2007   Vitamin D deficiency      Health Maintenance  Topic Date Due   Zoster Vaccines- Shingrix (1 of 2) Never done   COVID-19 Vaccine (2 - Janssen risk series) 04/24/2019   INFLUENZA VACCINE  08/11/2020   TETANUS/TDAP  07/10/2026   Hepatitis C Screening  Completed   HIV Screening  Completed   HPV  VACCINES  Aged Out     Immunization History  Administered Date(s) Administered   Influenza,inj,Quad  12/27/2017, 12/15/2018   Janssen (J&J) SARS-COV-2 Vacc 03/27/2019   PPD Test 03/21/2020   Pneumococcal -23 01/11/1997, 12/27/2017   Td 01/12/2004   Tdap 07/09/2016   Zoster, Live 01/12/2008    Last Colon - 04/23/2019 - Dr Carlean Purl - Recc recall 3 year F/U - Apr 2024   Past Surgical History:  Procedure Laterality Date   COLONOSCOPY  2009   CG  hems, TAs   LYMPHADENECTOMY N/A 06/28/2016   Procedure: Orlan Leavens, MD   PROSTATE BIOPSY  03/16/2016   Surgical Pathology Gross and Microscopic Examination for Prostate Needle   ROBOT ASSISTED LAPAROSCOPIC RADICAL PROSTATECTOMY N/A 06/28/2016   Procedure: XI ROBOTIC ASSISTED LAPAROSCOPIC RADICAL PROSTATECTOMY LEVEL 2 EXCISION OF PENILE LESION;   Raynelle Bring, MD     Family History   Problem Relation Age of Onset   Cancer Mother        lung   Lung disease Neg Hx    Colon cancer Neg Hx    Rectal cancer Neg Hx    Stomach cancer Neg Hx      Social History   Socioeconomic History   Marital status: Widowed   Number of children: None  Occupational History    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 No history of bird or mold exposure. No recent hot tub exposure. Does have 2 dogs. Tobacco Use   Smoking status: Every Day    Packs/day: 0.50    Years: 40.00    Pack years: 20.00    Types: Cigarettes   Smokeless tobacco: Never   Tobacco comments:    has decreased smoking significantly with goal of quitting  Vaping Use   Vaping Use: Never used  Substance Use Topics   Alcohol use: Yes    Alcohol/week: 4.0 standard drinks    Types: 4 Cans of beer per week    Comment: occasionally   Drug use: No      ROS Constitutional: Denies fever, chills, weight loss/gain, headaches, insomnia,  night sweats or change in appetite. Does c/o fatigue. Eyes: Denies redness, blurred vision, diplopia, discharge, itchy or watery eyes.  ENT: Denies discharge, congestion, post nasal drip, epistaxis, sore throat, earache, hearing loss, dental pain, Tinnitus, Vertigo, Sinus pain or snoring.  Cardio: Denies chest pain, palpitations, irregular heartbeat, syncope, dyspnea, diaphoresis, orthopnea, PND, claudication or edema Respiratory: denies cough, dyspnea, DOE, pleurisy, hoarseness, laryngitis or wheezing.  Gastrointestinal: Denies dysphagia, heartburn, reflux, water brash, pain, cramps, nausea, vomiting, bloating, diarrhea, constipation, hematemesis, melena, hematochezia, jaundice or hemorrhoids Genitourinary: Denies dysuria, frequency, urgency, nocturia, hesitancy, discharge, hematuria or flank pain Musculoskeletal: Denies arthralgia, myalgia, stiffness, Jt. Swelling, pain, limp or strain/sprain. Denies Falls. Skin: Denies  puritis, rash, hives, warts, acne, eczema or change in skin lesion Neuro: No weakness, tremor, incoordination, spasms, paresthesia or pain Psychiatric: Denies confusion, memory loss or sensory loss. Denies Depression. Endocrine: Denies change in weight, skin, hair change, nocturia, and paresthesia, diabetic polys, visual blurring or hyper / hypo glycemic episodes.  Heme/Lymph: No excessive bleeding, bruising or enlarged lymph nodes.   Physical Exam  BP 138/84   Pulse 71   Temp 97.9 F (36.6 C)   Resp 16   Ht 5\' 6"  (1.676 m)   Wt 260 lb 6.4 oz (118.1 kg)   SpO2 96%   BMI 42.03 kg/m  General Appearance: Well nourished and well groomed and in no apparent distress.  Eyes: PERRLA, EOMs, conjunctiva no swelling or erythema, normal fundi and vessels. Sinuses: No frontal/maxillary tenderness ENT/Mouth: EACs patent / TMs  nl. Nares clear without erythema, swelling, mucoid exudates. Oral hygiene is good. No erythema, swelling, or exudate. Tongue normal, non-obstructing. Tonsils not swollen or erythematous. Hearing normal.  Neck: Supple, thyroid not palpable. No bruits, nodes or JVD. Respiratory: Respiratory effort normal.  BS equal and clear bilateral without rales, rhonci, wheezing or stridor. Cardio: Heart sounds are normal with regular rate and rhythm and no murmurs, rubs or gallops. Peripheral pulses are normal and equal bilaterally without edema. No aortic or femoral bruits. Chest: symmetric with normal excursions and percussion.  Abdomen: Soft, with Nl bowel sounds. Nontender, no guarding, rebound, hernias, masses, or organomegaly.  Lymphatics: Non tender without lymphadenopathy.  Musculoskeletal: Full ROM all peripheral extremities, joint stability, 5/5 strength, and normal gait. Skin: Warm and dry without rashes, lesions, cyanosis, clubbing or  ecchymosis.  Neuro: Cranial nerves intact, reflexes equal bilaterally. Normal muscle tone, no cerebellar symptoms. Sensation intact.  Pysch:  Alert and oriented X 3 with normal affect, insight and judgment appropriate.   Assessment and Plan  1. Annual Preventative/Screening Exam    2. Essential hypertension  - EKG 12-Lead - [place], RETROPERITNL ABD,  LTD - Urinalysis, Routine w reflex microscopic - Microalbumin / creatinine urine ratio - CBC with Differential/Platelet - COMPLETE METABOLIC PANEL WITH GFR - Magnesium - TSH  3. Hyperlipidemia, mixed  - EKG 12-Lead - [place], RETROPERITNL ABD,  LTD - Lipid panel - TSH  4. Abnormal glucose  - EKG 12-Lead - [place], RETROPERITNL ABD,  LTD - Hemoglobin A1c - Insulin, random  5. Vitamin D deficiency  - VITAMIN D 25 Hydroxy  6. COPD, severe ([place])   7. History of prostate cancer  - PSA  8. Personal history of colonic adenomas  - Ambulatory referral to Gastroenterology  9. Class 2 severe obesity due to excess calories with serious  comorbidity and body mass index (BMI) of 39.0 to 39.9 in adult ([place])   10. Prostate cancer screening  - PSA  11. Screening for colorectal cancer  - Ambulatory referral to Gastroenterology  12. Screening for heart disease  - EKG 12-Lead  13. Smoker  - EKG 12-Lead - [place], RETROPERITNL ABD,  LTD  14. FH: hypertension  - EKG 12-Lead - [place], RETROPERITNL ABD,  LTD  15. Screening for AAA (aortic abdominal aneurysm)  - [place], RETROPERITNL ABD,  LTD  16. Medication management  - Urinalysis, Routine w reflex microscopic - Microalbumin / creatinine urine ratio - CBC with Differential/Platelet - COMPLETE METABOLIC PANEL WITH GFR - Magnesium - Lipid panel - TSH - Hemoglobin A1c - Insulin, random - VITAMIN D 25 Hydroxy          Patient was counseled in prudent diet, weight control to achieve/maintain BMI less than 25, BP monitoring, regular exercise and medications as discussed.  Discussed med effects and SE's. Routine screening labs and tests as requested with regular follow-up as recommended. Over 40 minutes of exam, counseling,  chart review and high complex critical decision making was performed   Kirtland Bouchard, MD

## 2022-04-06 ENCOUNTER — Ambulatory Visit: Payer: BC Managed Care – PPO | Admitting: Internal Medicine

## 2022-04-06 ENCOUNTER — Encounter: Payer: Self-pay | Admitting: Internal Medicine

## 2022-04-06 VITALS — BP 138/84 | HR 71 | Temp 97.9°F | Resp 16 | Ht 66.0 in | Wt 260.4 lb

## 2022-04-06 DIAGNOSIS — I700 Atherosclerosis of aorta: Secondary | ICD-10-CM

## 2022-04-06 DIAGNOSIS — Z0001 Encounter for general adult medical examination with abnormal findings: Secondary | ICD-10-CM

## 2022-04-06 DIAGNOSIS — Z125 Encounter for screening for malignant neoplasm of prostate: Secondary | ICD-10-CM | POA: Diagnosis not present

## 2022-04-06 DIAGNOSIS — Z1389 Encounter for screening for other disorder: Secondary | ICD-10-CM | POA: Diagnosis not present

## 2022-04-06 DIAGNOSIS — N401 Enlarged prostate with lower urinary tract symptoms: Secondary | ICD-10-CM | POA: Diagnosis not present

## 2022-04-06 DIAGNOSIS — E559 Vitamin D deficiency, unspecified: Secondary | ICD-10-CM

## 2022-04-06 DIAGNOSIS — Z8546 Personal history of malignant neoplasm of prostate: Secondary | ICD-10-CM

## 2022-04-06 DIAGNOSIS — Z8249 Family history of ischemic heart disease and other diseases of the circulatory system: Secondary | ICD-10-CM

## 2022-04-06 DIAGNOSIS — E782 Mixed hyperlipidemia: Secondary | ICD-10-CM

## 2022-04-06 DIAGNOSIS — Z1211 Encounter for screening for malignant neoplasm of colon: Secondary | ICD-10-CM

## 2022-04-06 DIAGNOSIS — Z0000 Encounter for general adult medical examination without abnormal findings: Secondary | ICD-10-CM

## 2022-04-06 DIAGNOSIS — Z13220 Encounter for screening for lipoid disorders: Secondary | ICD-10-CM

## 2022-04-06 DIAGNOSIS — Z131 Encounter for screening for diabetes mellitus: Secondary | ICD-10-CM

## 2022-04-06 DIAGNOSIS — Z86010 Personal history of colonic polyps: Secondary | ICD-10-CM

## 2022-04-06 DIAGNOSIS — Z136 Encounter for screening for cardiovascular disorders: Secondary | ICD-10-CM

## 2022-04-06 DIAGNOSIS — R7309 Other abnormal glucose: Secondary | ICD-10-CM

## 2022-04-06 DIAGNOSIS — F17200 Nicotine dependence, unspecified, uncomplicated: Secondary | ICD-10-CM

## 2022-04-06 DIAGNOSIS — J449 Chronic obstructive pulmonary disease, unspecified: Secondary | ICD-10-CM

## 2022-04-06 DIAGNOSIS — I10 Essential (primary) hypertension: Secondary | ICD-10-CM

## 2022-04-06 DIAGNOSIS — Z79899 Other long term (current) drug therapy: Secondary | ICD-10-CM | POA: Diagnosis not present

## 2022-04-06 DIAGNOSIS — R350 Frequency of micturition: Secondary | ICD-10-CM

## 2022-04-07 LAB — CBC WITH DIFFERENTIAL/PLATELET
Absolute Monocytes: 722 cells/uL (ref 200–950)
Basophils Absolute: 49 cells/uL (ref 0–200)
Basophils Relative: 0.6 %
Eosinophils Absolute: 164 cells/uL (ref 15–500)
Eosinophils Relative: 2 %
HCT: 46 % (ref 38.5–50.0)
Hemoglobin: 15.8 g/dL (ref 13.2–17.1)
Lymphs Abs: 1853 cells/uL (ref 850–3900)
MCH: 32.6 pg (ref 27.0–33.0)
MCHC: 34.3 g/dL (ref 32.0–36.0)
MCV: 95 fL (ref 80.0–100.0)
MPV: 9.4 fL (ref 7.5–12.5)
Monocytes Relative: 8.8 %
Neutro Abs: 5412 cells/uL (ref 1500–7800)
Neutrophils Relative %: 66 %
Platelets: 218 10*3/uL (ref 140–400)
RBC: 4.84 10*6/uL (ref 4.20–5.80)
RDW: 11.6 % (ref 11.0–15.0)
Total Lymphocyte: 22.6 %
WBC: 8.2 10*3/uL (ref 3.8–10.8)

## 2022-04-07 LAB — COMPLETE METABOLIC PANEL WITH GFR
AG Ratio: 1.5 (calc) (ref 1.0–2.5)
ALT: 17 U/L (ref 9–46)
AST: 18 U/L (ref 10–35)
Albumin: 4.1 g/dL (ref 3.6–5.1)
Alkaline phosphatase (APISO): 67 U/L (ref 35–144)
BUN/Creatinine Ratio: 16 (calc) (ref 6–22)
BUN: 10 mg/dL (ref 7–25)
CO2: 31 mmol/L (ref 20–32)
Calcium: 8.9 mg/dL (ref 8.6–10.3)
Chloride: 100 mmol/L (ref 98–110)
Creat: 0.62 mg/dL — ABNORMAL LOW (ref 0.70–1.35)
Globulin: 2.7 g/dL (calc) (ref 1.9–3.7)
Glucose, Bld: 122 mg/dL — ABNORMAL HIGH (ref 65–99)
Potassium: 4.1 mmol/L (ref 3.5–5.3)
Sodium: 137 mmol/L (ref 135–146)
Total Bilirubin: 0.7 mg/dL (ref 0.2–1.2)
Total Protein: 6.8 g/dL (ref 6.1–8.1)
eGFR: 106 mL/min/{1.73_m2} (ref >=60)

## 2022-04-07 LAB — URINALYSIS, ROUTINE W REFLEX MICROSCOPIC
Bacteria, UA: NONE SEEN /HPF
Bilirubin Urine: NEGATIVE
Glucose, UA: NEGATIVE
Hgb urine dipstick: NEGATIVE
Hyaline Cast: NONE SEEN /LPF
Ketones, ur: NEGATIVE
Leukocytes,Ua: NEGATIVE
Nitrite: NEGATIVE
RBC / HPF: NONE SEEN /HPF (ref 0–2)
Specific Gravity, Urine: 1.009 (ref 1.001–1.035)
Squamous Epithelial / HPF: NONE SEEN /HPF (ref <=5)
WBC, UA: NONE SEEN /HPF (ref 0–5)
pH: 6.5 (ref 5.0–8.0)

## 2022-04-07 LAB — LIPID PANEL
Cholesterol: 147 mg/dL (ref <200)
HDL: 59 mg/dL (ref >=40)
LDL Cholesterol (Calc): 73 mg/dL (calc)
Non-HDL Cholesterol (Calc): 88 mg/dL (calc) (ref <130)
Total CHOL/HDL Ratio: 2.5 (calc) (ref <5.0)
Triglycerides: 66 mg/dL (ref <150)

## 2022-04-07 LAB — MICROALBUMIN / CREATININE URINE RATIO
Creatinine, Urine: 53 mg/dL (ref 20–320)
Microalb Creat Ratio: 153 mg/g creat — ABNORMAL HIGH (ref <30)
Microalb, Ur: 8.1 mg/dL

## 2022-04-07 LAB — TSH: TSH: 1.71 mIU/L (ref 0.40–4.50)

## 2022-04-07 LAB — INSULIN, RANDOM: Insulin: 64.3 u[IU]/mL — ABNORMAL HIGH

## 2022-04-07 LAB — HEMOGLOBIN A1C
Hgb A1c MFr Bld: 5.9 % of total Hgb — ABNORMAL HIGH (ref <5.7)
Mean Plasma Glucose: 123 mg/dL
eAG (mmol/L): 6.8 mmol/L

## 2022-04-07 LAB — VITAMIN D 25 HYDROXY (VIT D DEFICIENCY, FRACTURES): Vit D, 25-Hydroxy: 61 ng/mL (ref 30–100)

## 2022-04-07 LAB — MAGNESIUM: Magnesium: 2 mg/dL (ref 1.5–2.5)

## 2022-04-07 LAB — MICROSCOPIC MESSAGE

## 2022-04-07 LAB — PSA: PSA: <0.04 ng/mL (ref <=4.00)

## 2022-04-07 NOTE — Progress Notes (Signed)
<><><><><><><><><><><><><><><><><><><><><><><><><><><><><><><><><> <><><><><><><><><><><><><><><><><><><><><><><><><><><><><><><><><> - Test results slightly outside the reference range are not unusual. If there is anything important, I will review this with you,  otherwise it is considered normal test values.  If you have further questions,  please do not hesitate to contact me at the office or via My Chart.  <><><><><><><><><><><><><><><><><><><><><><><><><><><><><><><><><> <><><><><><><><><><><><><><><><><><><><><><><><><><><><><><><><><>  -  A1c = 5.9%   ( Goal is less than 5.7%N 5.7%)  & Blood sugar and A1c are           elevated in the borderline and early or pre-diabetes range which has the same   300% increased risk for heart attack, stroke, cancer and                                            alzheimer- type vascular dementia as full blown diabetes.   But the good news is that diet, exercise with weight loss can cure                                                                                       the early diabetes at this point.   -  it is very important that you work harder with diet by                               avoiding all foods that are white except chicken, fish & calliflower.  - Avoid white rice  (brown & wild rice is OK),   - Avoid white potatoes  (sweet potatoes in moderation is OK),   White bread or wheat bread or anything made out of white flour like                                                                 bagels, donuts, rolls, buns, biscuits, cake  - pastries, cookies, pizza crust, and pasta (made from white flour & egg whites)   - vegetarian pasta or spinach or wheat pasta is OK.  - Multigrain breads like Arnold's, Pepperidge Farm or   multigrain sandwich thins or high fiber breads like   Eureka bread or "Dave's Killer" breads that are 4 to 5 grams fiber per slice !  are best.    Diet, exercise and weight loss can reverse and cure   diabetes in the early stages.    - Diet, exercise and weight loss is very important in the   control and prevention of complications of diabetes which  affects every system in your body <><><><><><><><><><><><><><><><><><><><><><><><><><><><><><><><><> <><><><><><><><><><><><><><><><><><><><><><><><><><><><><><><><><>  -  PSA  - Undetectable  - Great  ! <><><><><><><><><><><><><><><><><><><><><><><><><><><><><><><><><>  -  Total Chol = 147   &  LDL Chol = 73  - Both  Excellent   - Very low risk for Heart Attack  / Stroke <><><><><><><><><><><><><><><><><><><><><><><><><><><><><><><><><> <><><><><><><><><><><><><><><><><><><><><><><><><><><><><><><><><>  -  Vitamin D = 61 -- Great !  - Please keep dose same  <><><><><><><><><><><><><><><><><><><><><><><><><><><><><><><><><>  -  All Else - CBC - Kidneys - Electrolytes - Liver - Magnesium & Thyroid    - all  Normal / OK <><><><><><><><><><><><><><><><><><><><><><><><><><><><><><><><><> <><><><><><><><><><><><><><><><><><><><><><><><><><><><><><><><><>  -  Keep up the Great Work  !  <><><><><><><><><><><><><><><><><><><><><><><><><><><><><><><><><> <><><><><><><><><><><><><><><><><><><><><><><><><><><><><><><><><>

## 2022-04-13 ENCOUNTER — Other Ambulatory Visit: Payer: Self-pay | Admitting: Internal Medicine

## 2022-04-13 DIAGNOSIS — F17200 Nicotine dependence, unspecified, uncomplicated: Secondary | ICD-10-CM

## 2022-04-13 MED ORDER — VARENICLINE TARTRATE 1 MG PO TABS: ORAL_TABLET | ORAL | 3 refills | Status: DC

## 2022-04-18 DIAGNOSIS — J441 Chronic obstructive pulmonary disease with (acute) exacerbation: Secondary | ICD-10-CM | POA: Diagnosis not present

## 2022-04-18 DIAGNOSIS — J189 Pneumonia, unspecified organism: Secondary | ICD-10-CM | POA: Diagnosis not present

## 2022-04-18 DIAGNOSIS — A419 Sepsis, unspecified organism: Secondary | ICD-10-CM | POA: Diagnosis not present

## 2022-04-19 ENCOUNTER — Other Ambulatory Visit: Payer: Self-pay | Admitting: Internal Medicine

## 2022-04-19 DIAGNOSIS — E669 Obesity, unspecified: Secondary | ICD-10-CM

## 2022-04-19 MED ORDER — SEMAGLUTIDE-WEIGHT MANAGEMENT 0.25 MG/0.5ML ~~LOC~~ SOAJ
0.2500 mg | SUBCUTANEOUS | 0 refills | Status: DC
Start: 2022-04-19 — End: 2022-05-25

## 2022-04-26 ENCOUNTER — Telehealth: Payer: Self-pay

## 2022-04-26 NOTE — Telephone Encounter (Signed)
Wegovy 0.25mg /0.41ml has been approved for the patient for 4 pens per 180 days with titration through all strengths using 4 pens of each strength every 28 days. A message was left for the patient to contact his pharmacy to fill this medications.

## 2022-05-18 DIAGNOSIS — J189 Pneumonia, unspecified organism: Secondary | ICD-10-CM | POA: Diagnosis not present

## 2022-05-18 DIAGNOSIS — A419 Sepsis, unspecified organism: Secondary | ICD-10-CM | POA: Diagnosis not present

## 2022-05-18 DIAGNOSIS — J441 Chronic obstructive pulmonary disease with (acute) exacerbation: Secondary | ICD-10-CM | POA: Diagnosis not present

## 2022-05-25 ENCOUNTER — Other Ambulatory Visit: Payer: Self-pay | Admitting: Internal Medicine

## 2022-05-25 DIAGNOSIS — E669 Obesity, unspecified: Secondary | ICD-10-CM

## 2022-05-25 MED ORDER — SEMAGLUTIDE-WEIGHT MANAGEMENT 0.5 MG/0.5ML ~~LOC~~ SOAJ
SUBCUTANEOUS | 0 refills | Status: DC
Start: 2022-05-25 — End: 2022-06-23

## 2022-06-04 ENCOUNTER — Encounter: Payer: Self-pay | Admitting: Internal Medicine

## 2022-06-11 DIAGNOSIS — C44629 Squamous cell carcinoma of skin of left upper limb, including shoulder: Secondary | ICD-10-CM | POA: Diagnosis not present

## 2022-06-18 DIAGNOSIS — J189 Pneumonia, unspecified organism: Secondary | ICD-10-CM | POA: Diagnosis not present

## 2022-06-18 DIAGNOSIS — J441 Chronic obstructive pulmonary disease with (acute) exacerbation: Secondary | ICD-10-CM | POA: Diagnosis not present

## 2022-06-18 DIAGNOSIS — A419 Sepsis, unspecified organism: Secondary | ICD-10-CM | POA: Diagnosis not present

## 2022-06-23 ENCOUNTER — Telehealth: Payer: Self-pay | Admitting: Nurse Practitioner

## 2022-06-23 DIAGNOSIS — E669 Obesity, unspecified: Secondary | ICD-10-CM

## 2022-06-23 MED ORDER — SEMAGLUTIDE-WEIGHT MANAGEMENT 0.5 MG/0.5ML ~~LOC~~ SOAJ
SUBCUTANEOUS | 0 refills | Status: DC
Start: 2022-06-23 — End: 2022-07-14

## 2022-06-23 NOTE — Addendum Note (Signed)
Addended by: Dionicio Stall on: 06/23/2022 10:27 AM   Modules accepted: Orders

## 2022-06-23 NOTE — Telephone Encounter (Signed)
Patient is requesting a refill on Wegovy to [facility] [address] [facility]

## 2022-06-27 ENCOUNTER — Other Ambulatory Visit: Payer: Self-pay | Admitting: Internal Medicine

## 2022-06-27 DIAGNOSIS — E782 Mixed hyperlipidemia: Secondary | ICD-10-CM

## 2022-06-28 ENCOUNTER — Telehealth: Payer: Self-pay

## 2022-06-28 NOTE — Telephone Encounter (Signed)
The patient's Wegovy 0.25/0.5 ML has been approved 04/21/22-04/21/23 with titration up in medication as requirements. The patient was LVM with information to contact his pharmacy to fill this medication and to contact this office with any questions.

## 2022-07-14 ENCOUNTER — Ambulatory Visit: Payer: BC Managed Care – PPO | Admitting: Nurse Practitioner

## 2022-07-14 ENCOUNTER — Encounter: Payer: Self-pay | Admitting: Nurse Practitioner

## 2022-07-14 ENCOUNTER — Other Ambulatory Visit: Payer: Self-pay

## 2022-07-14 VITALS — BP 122/72 | HR 68 | Temp 98.5°F | Ht 66.0 in | Wt 259.4 lb

## 2022-07-14 DIAGNOSIS — E782 Mixed hyperlipidemia: Secondary | ICD-10-CM

## 2022-07-14 DIAGNOSIS — I10 Essential (primary) hypertension: Secondary | ICD-10-CM | POA: Diagnosis not present

## 2022-07-14 DIAGNOSIS — R7303 Prediabetes: Secondary | ICD-10-CM | POA: Diagnosis not present

## 2022-07-14 DIAGNOSIS — R239 Unspecified skin changes: Secondary | ICD-10-CM

## 2022-07-14 DIAGNOSIS — C61 Malignant neoplasm of prostate: Secondary | ICD-10-CM

## 2022-07-14 DIAGNOSIS — E349 Endocrine disorder, unspecified: Secondary | ICD-10-CM

## 2022-07-14 DIAGNOSIS — E559 Vitamin D deficiency, unspecified: Secondary | ICD-10-CM

## 2022-07-14 DIAGNOSIS — J449 Chronic obstructive pulmonary disease, unspecified: Secondary | ICD-10-CM

## 2022-07-14 DIAGNOSIS — E669 Obesity, unspecified: Secondary | ICD-10-CM

## 2022-07-14 DIAGNOSIS — Z79899 Other long term (current) drug therapy: Secondary | ICD-10-CM

## 2022-07-14 LAB — CBC WITH DIFFERENTIAL/PLATELET
Hemoglobin: 14.3 g/dL (ref 13.2–17.1)
MCH: 32.1 pg (ref 27.0–33.0)
MCHC: 33.7 g/dL (ref 32.0–36.0)
MCV: 95.3 fL (ref 80.0–100.0)
Neutro Abs: 5818 cells/uL (ref 1500–7800)
Neutrophils Relative %: 60.6 %
RBC: 4.45 10*6/uL (ref 4.20–5.80)
WBC: 9.6 10*3/uL (ref 3.8–10.8)

## 2022-07-14 MED ORDER — WEGOVY 1 MG/0.5ML ~~LOC~~ SOAJ: 1.0000 mg | SUBCUTANEOUS | Status: DC

## 2022-07-14 NOTE — Progress Notes (Signed)
FOLLOW UP 3 MONTH  Assessment and Plan:   Robert [name] was seen today for follow-up.  Diagnoses and all orders for this visit:  1. Essential hypertension Controlled  Continue medications;  Discussed DASH (Dietary Approaches to Stop Hypertension) DASH diet is lower in sodium than a typical American diet. Cut back on foods that are high in saturated fat, cholesterol, and trans fats. Eat more whole-grain foods, fish, poultry, and nuts Remain active and exercise as tolerated daily.  Monitor BP at home-Call if greater than 130/80.    2. COPD, severe (HCC)/Smoker Trelegy sample provided Stop  Breztri Continue SABA PRN Smoking cessation discussed.  3. Malignant neoplasm of prostate (HCC) Symptoms controlled Continue to monitor  4. Hyperlipidemia, mixed Controlled Continue medications;  Discussed lifestyle modifications. Recommended diet heavy in fruits and veggies, omega 3's. Decrease consumption of animal meats, cheeses, and dairy products. Remain active and exercise as tolerated. Continue to monitor.  5. Vitamin D deficiency Above normal but not at goal. Continue supplement  6. Testosterone deficiency Continue to monitor  7. Obesity (BMI 35.55) Discussed appropriate BMI Continue Semaglutide - dosage increased Diet modification. Physical activity. Encouraged/praised to build confidence.  9. Prediabetes Education: Reviewed 'ABCs' of diabetes management  Discussed goals to be met and/or maintained include A1C (<7) Blood pressure (<130/80) Cholesterol (LDL <70) Continue Eye Exam yearly  Continue Dental Exam Q6 mo Discussed dietary recommendations Discussed Physical Activity recommendations Check A1C  10.  Skin change Requested dermatology report for review of skin cancer type Continue to follow with Dermatology  Medication management All medications discussed and reviewed in full. All questions and concerns regarding medications addressed.    Orders Placed This  Encounter  Procedures   CBC with Differential/Platelet   COMPLETE METABOLIC PANEL WITH GFR   Lipid panel   Hemoglobin A1c   Notify office for further evaluation and treatment, questions or concerns if any reported s/s fail to improve.   The patient was advised to call back or seek an in-person evaluation if any symptoms worsen or if the condition fails to improve as anticipated.   Further disposition pending results of labs. Discussed med's effects and SE's.    I discussed the assessment and treatment plan with the patient. The patient was provided an opportunity to ask questions and all were answered. The patient agreed with the plan and demonstrated an understanding of the instructions.  Discussed med's effects and SE's. Screening labs and tests as requested with regular follow-up as recommended.  I provided 20 minutes of face-to-face time during this encounter including counseling, chart review, and critical decision making was preformed.   Future Appointments  Date Time Provider Department Center  10/14/2022  4:00 PM Lucky Cowboy, MD [facility] None  01/18/2023  4:00 PM Adela Glimpse, NP [facility] None  04/20/2023  3:00 PM Lucky Cowboy, MD [facility] None    ----------------------------------------------------------------------------------------------------------------------  HPI 67 y.o. male  presents for 3 month follow up on HTN, HLD, abnormal glucose with history of pre-diabetes, weight and vitamin D deficiency.   Reports overall he is doing well.    He had a concern for a skin change area on his right hand and was referred to Dermatology.  Removed area.  Patient reports receiving a report in the mail this week that has diagnosed him skin cancer.  Returned back for follow up and area has returned negative for any further cancer.   BMI is Body mass index is 41.87 kg/m., he has not been working on diet and exercise.  Wt Readings from Last 3 Encounters:  07/14/22 259  lb 6.4 oz (117.7 kg)  04/06/22 260 lb 6.4 oz (118.1 kg)  03/02/22 257 lb (116.6 kg)    His blood pressure has been controlled at home, today their BP is BP: 122/72  He does not workout. He denies any cardiac symptoms, chest pains, palpitations, shortness of breath, dizziness or lower extremity edema.      He is on cholesterol medication Atorvastatin and denies myalgias.   His cholesterol is at goal. The cholesterol last visit was:   Lab Results  Component Value Date   CHOL 147 04/06/2022   HDL 59 04/06/2022   LDLCALC 73 04/06/2022   TRIG 66 04/06/2022   CHOLHDL 2.5 04/06/2022    He has not been working on diet and exercise for prediabetes, and denies polydipsia, polyuria, visual disturbances, vomiting and weight loss. Last A1C in the office was:  Lab Results  Component Value Date   HGBA1C 5.9 (H) 04/06/2022     Patient does not have history of CKD.  Their last GFR was:  Lab Results  Component Value Date   GFRNONAA >60 02/17/2022     Patient is not on Vitamin D supplement for deficiency. Lab Results  Component Value Date   VD25OH 61 04/06/2022        Current Medications:  Current Outpatient Medications on File Prior to Visit  Medication Sig   albuterol (VENTOLIN HFA) 108 (90 Base) MCG/ACT inhaler Inhale 2 puffs into the lungs every 6 (six) hours as needed for wheezing or shortness of breath.   aspirin EC 81 MG tablet Take 81 mg by mouth daily.   atorvastatin (LIPITOR) 20 MG tablet TAKE 1 TABLET BY MOUTH DAILY FOR CHOLESTEROL   benazepril-hydrochlorthiazide (LOTENSIN HCT) 20-25 MG tablet TAKE ONE TABLET BY MOUTH DAILY FOR BLOOD PRESSURE AND FLUID RETENTION/ANKLE SWELLING   Budeson-Glycopyrrol-Formoterol (BREZTRI AEROSPHERE) 160-9-4.8 MCG/ACT AERO Use  2 Inhalations  15 minutes  Apart  2 x /day (every 12 hours) (Patient taking differently: Inhale 2 puffs into the lungs every 12 (twelve) hours.)   Cholecalciferol (VITAMIN D) 125 MCG (5000 UT) CAPS Take 5,000 Units by  mouth daily.   Magnesium 500 MG TABS Take 500 mg by mouth daily.   Semaglutide-Weight Management 0.5 MG/0.5ML SOAJ Inject   1 pen (0.5 mg)   into Skin  every 7 days   varenicline (CHANTIX CONTINUING MONTH PAK) 1 MG tablet Take  1 tablet 2 x / day for Smoking Cessation   No current facility-administered medications on file prior to visit.    Allergies:  Allergies  Allergen Reactions   Codeine Other (See Comments)    REACTION: Itching   Isovue [Iopamidol] Hives    Pt. Developed 1 hive after injection; Dr. 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   COPD (chronic obstructive pulmonary disease) (HCC)    Elevated hemoglobin A1c    Elevated PSA 03/16/2016   Hypertension    Personal history of colonic adenomas 12/28/2007   Vitamin D deficiency      Family history- Reviewed and unchanged Family History  Problem Relation Age of Onset   Cancer Mother        lung   Lung disease Neg Hx    Colon cancer Neg Hx    Rectal cancer Neg Hx  Stomach cancer Neg Hx      Social history- Reviewed and unchanged Social History   Tobacco Use   Smoking status: Every Day    Packs/day: 0.50    Years: 40.00    Additional pack years: 0.00    Total pack years: 20.00    Types: Cigarettes   Smokeless tobacco: Never   Tobacco comments:    has decreased smoking significantly with goal of quitting  Vaping Use   Vaping Use: Never used  Substance Use Topics   Alcohol use: Yes    Alcohol/week: 4.0 standard drinks of alcohol    Types: 4 Cans of beer per week    Comment: occasionally   Drug use: No    Patient Care Team: Lucky Cowboy, MD as PCP - General (Internal Medicine) Benjiman Core, MD (Inactive) as Consulting Physician (Oncology) Margaretmary Dys, MD as Consulting Physician (Radiation Oncology)    Screening Tests: Immunization  History  Administered Date(s) Administered   Influenza,inj,Quad PF,6+ Mos 12/27/2017, 12/15/2018, 01/07/2022   Janssen (J&J) SARS-COV-2 Vaccination 03/27/2019   PPD Test 03/21/2020, 03/31/2021   Pneumococcal Polysaccharide-23 01/11/1997, 12/27/2017   Td 01/12/2004   Tdap 07/09/2016   Zoster, Live 01/12/2008     Review of Systems  Constitutional:  Negative for chills, fever and weight loss.  HENT:  Negative for congestion and hearing loss.   Eyes:  Negative for blurred vision and double vision.  Respiratory:  Negative for cough and shortness of breath.   Cardiovascular:  Negative for chest pain, palpitations, orthopnea and leg swelling.  Gastrointestinal:  Negative for abdominal pain, constipation, diarrhea, heartburn, nausea and vomiting.  Genitourinary:  Negative for dysuria.  Musculoskeletal:  Negative for falls, joint pain and myalgias.  Skin:  Negative for rash.  Neurological:  Negative for dizziness, tingling, tremors, loss of consciousness and headaches.  Endo/Heme/Allergies:  Does not bruise/bleed easily.  Psychiatric/Behavioral:  Negative for depression, memory loss and suicidal ideas.     Physical Exam: BP 122/72   Pulse 68   Temp 98.5 F (36.9 C)   Ht 5\' 6"  (1.676 m)   Wt 259 lb 6.4 oz (117.7 kg)   SpO2 92%   BMI 41.87 kg/m  Wt Readings from Last 3 Encounters:  07/14/22 259 lb 6.4 oz (117.7 kg)  04/06/22 260 lb 6.4 oz (118.1 kg)  03/02/22 257 lb (116.6 kg)   General Appearance: Well nourished, in no apparent distress. Eyes: PERRLA, EOMs, conjunctiva no swelling or erythema Sinuses: No Frontal/maxillary tenderness ENT/Mouth: Ext aud canals clear, TMs without erythema, bulging. No erythema, swelling, or exudate on post pharynx.  Tonsils not swollen or erythematous. Hearing normal.  Neck: Supple, thyroid normal.  Respiratory: Respiratory effort normal, BS equal bilaterally without rales, rhonchi, wheezing or stridor.  Cardio: RRR with no MRGs. Brisk peripheral  pulses without edema.  Abdomen: Soft, + BS.  Non tender, no guarding, rebound, hernias, masses. Lymphatics: Non tender without lymphadenopathy.  Musculoskeletal: Full ROM, 5/5 strength, Normal gait Skin: Appropriate color for ethnicity. Warm.  Area cleansed with alcohol snipped with scissors and silver nitrate applied Neuro: Cranial nerves intact. No cerebellar symptoms.  Psych: Awake and oriented X 3, normal affect, Insight and Judgment appropriate.   Adela Glimpse, NP [facility]

## 2022-07-14 NOTE — Patient Instructions (Signed)

## 2022-07-15 LAB — COMPLETE METABOLIC PANEL WITH GFR
AG Ratio: 1.7 (calc) (ref 1.0–2.5)
ALT: 20 U/L (ref 9–46)
AST: 17 U/L (ref 10–35)
Albumin: 4 g/dL (ref 3.6–5.1)
Alkaline phosphatase (APISO): 64 U/L (ref 35–144)
BUN/Creatinine Ratio: 31 (calc) — ABNORMAL HIGH (ref 6–22)
BUN: 18 mg/dL (ref 7–25)
CO2: 30 mmol/L (ref 20–32)
Calcium: 9.1 mg/dL (ref 8.6–10.3)
Chloride: 101 mmol/L (ref 98–110)
Creat: 0.58 mg/dL — ABNORMAL LOW (ref 0.70–1.35)
Globulin: 2.3 g/dL (calc) (ref 1.9–3.7)
Glucose, Bld: 93 mg/dL (ref 65–99)
Potassium: 4.3 mmol/L (ref 3.5–5.3)
Sodium: 137 mmol/L (ref 135–146)
Total Bilirubin: 0.3 mg/dL (ref 0.2–1.2)
Total Protein: 6.3 g/dL (ref 6.1–8.1)
eGFR: 108 mL/min/{1.73_m2} (ref >=60)

## 2022-07-15 LAB — CBC WITH DIFFERENTIAL/PLATELET
Absolute Monocytes: 1094 cells/uL — ABNORMAL HIGH (ref 200–950)
Basophils Absolute: 58 cells/uL (ref 0–200)
Basophils Relative: 0.6 %
Eosinophils Absolute: 211 cells/uL (ref 15–500)
Eosinophils Relative: 2.2 %
HCT: 42.4 % (ref 38.5–50.0)
Lymphs Abs: 2419 cells/uL (ref 850–3900)
MPV: 9.4 fL (ref 7.5–12.5)
Monocytes Relative: 11.4 %
Platelets: 198 10*3/uL (ref 140–400)
RDW: 11.6 % (ref 11.0–15.0)
Total Lymphocyte: 25.2 %

## 2022-07-15 LAB — LIPID PANEL
Cholesterol: 120 mg/dL
HDL: 52 mg/dL
LDL Cholesterol (Calc): 54 mg/dL
Non-HDL Cholesterol (Calc): 68 mg/dL
Total CHOL/HDL Ratio: 2.3 (calc)
Triglycerides: 67 mg/dL

## 2022-07-15 LAB — HEMOGLOBIN A1C
Hgb A1c MFr Bld: 5.9 % of total Hgb — ABNORMAL HIGH (ref <5.7)
Mean Plasma Glucose: 123 mg/dL
eAG (mmol/L): 6.8 mmol/L

## 2022-07-18 DIAGNOSIS — A419 Sepsis, unspecified organism: Secondary | ICD-10-CM | POA: Diagnosis not present

## 2022-07-18 DIAGNOSIS — J441 Chronic obstructive pulmonary disease with (acute) exacerbation: Secondary | ICD-10-CM | POA: Diagnosis not present

## 2022-07-18 DIAGNOSIS — J189 Pneumonia, unspecified organism: Secondary | ICD-10-CM | POA: Diagnosis not present

## 2022-07-23 DIAGNOSIS — S61402A Unspecified open wound of left hand, initial encounter: Secondary | ICD-10-CM | POA: Diagnosis not present

## 2022-07-23 DIAGNOSIS — D485 Neoplasm of uncertain behavior of skin: Secondary | ICD-10-CM | POA: Diagnosis not present

## 2022-08-18 DIAGNOSIS — A419 Sepsis, unspecified organism: Secondary | ICD-10-CM | POA: Diagnosis not present

## 2022-08-18 DIAGNOSIS — J189 Pneumonia, unspecified organism: Secondary | ICD-10-CM | POA: Diagnosis not present

## 2022-08-18 DIAGNOSIS — J441 Chronic obstructive pulmonary disease with (acute) exacerbation: Secondary | ICD-10-CM | POA: Diagnosis not present

## 2022-08-24 DIAGNOSIS — R208 Other disturbances of skin sensation: Secondary | ICD-10-CM | POA: Diagnosis not present

## 2022-08-24 DIAGNOSIS — B078 Other viral warts: Secondary | ICD-10-CM | POA: Diagnosis not present

## 2022-08-24 DIAGNOSIS — C44619 Basal cell carcinoma of skin of left upper limb, including shoulder: Secondary | ICD-10-CM | POA: Diagnosis not present

## 2022-08-24 DIAGNOSIS — B353 Tinea pedis: Secondary | ICD-10-CM | POA: Diagnosis not present

## 2022-08-24 DIAGNOSIS — Z789 Other specified health status: Secondary | ICD-10-CM | POA: Diagnosis not present

## 2022-08-24 DIAGNOSIS — D485 Neoplasm of uncertain behavior of skin: Secondary | ICD-10-CM | POA: Diagnosis not present

## 2022-08-24 DIAGNOSIS — L84 Corns and callosities: Secondary | ICD-10-CM | POA: Diagnosis not present

## 2022-08-31 ENCOUNTER — Telehealth: Payer: Self-pay | Admitting: Nurse Practitioner

## 2022-08-31 NOTE — Telephone Encounter (Signed)
Patient is requesting a refill on Wegovy and is ready to increase in dose. Please send to Karin Golden at [facility]. [employer].

## 2022-09-01 ENCOUNTER — Encounter: Payer: Self-pay | Admitting: Nurse Practitioner

## 2022-09-02 ENCOUNTER — Other Ambulatory Visit: Payer: Self-pay | Admitting: Internal Medicine

## 2022-09-02 MED ORDER — WEGOVY 1.7 MG/0.75ML ~~LOC~~ SOAJ: 1.7000 mg | SUBCUTANEOUS | 2 refills | Status: DC

## 2022-09-02 MED ORDER — BREZTRI AEROSPHERE 160-9-4.8 MCG/ACT IN AERO: INHALATION_SPRAY | RESPIRATORY_TRACT | 3 refills | Status: DC

## 2022-09-10 DIAGNOSIS — C44619 Basal cell carcinoma of skin of left upper limb, including shoulder: Secondary | ICD-10-CM | POA: Diagnosis not present

## 2022-09-18 DIAGNOSIS — J441 Chronic obstructive pulmonary disease with (acute) exacerbation: Secondary | ICD-10-CM | POA: Diagnosis not present

## 2022-09-18 DIAGNOSIS — J189 Pneumonia, unspecified organism: Secondary | ICD-10-CM | POA: Diagnosis not present

## 2022-09-18 DIAGNOSIS — A419 Sepsis, unspecified organism: Secondary | ICD-10-CM | POA: Diagnosis not present

## 2022-09-22 ENCOUNTER — Telehealth: Payer: Self-pay

## 2022-09-22 NOTE — Telephone Encounter (Signed)
Prior auth completed and submitted. 

## 2022-09-24 MED ORDER — WEGOVY 1.7 MG/0.75ML ~~LOC~~ SOAJ: 1.7000 mg | SUBCUTANEOUS | 2 refills | Status: DC

## 2022-09-24 NOTE — Telephone Encounter (Signed)
PA approved and script sent to the pharmacy

## 2022-09-24 NOTE — Addendum Note (Signed)
Addended by: Dionicio Stall on: 09/24/2022 10:49 AM   Modules accepted: Orders

## 2022-09-30 DIAGNOSIS — B078 Other viral warts: Secondary | ICD-10-CM | POA: Diagnosis not present

## 2022-09-30 DIAGNOSIS — B079 Viral wart, unspecified: Secondary | ICD-10-CM | POA: Diagnosis not present

## 2022-10-14 ENCOUNTER — Ambulatory Visit (INDEPENDENT_AMBULATORY_CARE_PROVIDER_SITE_OTHER): Payer: BC Managed Care – PPO | Admitting: Internal Medicine

## 2022-10-14 ENCOUNTER — Encounter: Payer: Self-pay | Admitting: Internal Medicine

## 2022-10-14 VITALS — BP 122/70 | HR 73 | Temp 97.9°F | Resp 16 | Ht 66.0 in | Wt 249.4 lb

## 2022-10-14 DIAGNOSIS — Z23 Encounter for immunization: Secondary | ICD-10-CM | POA: Diagnosis not present

## 2022-10-14 DIAGNOSIS — Z79899 Other long term (current) drug therapy: Secondary | ICD-10-CM | POA: Diagnosis not present

## 2022-10-14 DIAGNOSIS — E559 Vitamin D deficiency, unspecified: Secondary | ICD-10-CM

## 2022-10-14 DIAGNOSIS — R7303 Prediabetes: Secondary | ICD-10-CM

## 2022-10-14 DIAGNOSIS — E782 Mixed hyperlipidemia: Secondary | ICD-10-CM | POA: Diagnosis not present

## 2022-10-14 DIAGNOSIS — I10 Essential (primary) hypertension: Secondary | ICD-10-CM

## 2022-10-14 NOTE — Patient Instructions (Signed)

## 2022-10-14 NOTE — Progress Notes (Signed)
Future Appointments  Date Time Provider Department  10/14/2022                             6 mo ov  4:00 PM Lucky Cowboy, MD [facility]  01/18/2023                                9 mo ov  4:00 PM Adela Glimpse, NP [facility]  05/05/2023                              cpe  2:00 PM Lucky Cowboy, MD [facility]       This very nice 67 y.o. [facility] presents for 6 month follow up with HTN, HLD, COPD, Pre-Diabetes and Vitamin D Deficiency. In 2018, patient  underwent a Robotic Prostatectomy for Ca .       Patient is treated for HTN (2004) & BP has been controlled at home. Today's BP is  at goal -  122/70 .  Patient has had no complaints of any cardiac type chest pain, palpitations, dyspnea Pollyann Kennedy /PND, dizziness, claudication or dependent edema.      Patient has COPD &  reports improvement with Breztri.       Hyperlipidemia is  controlled with diet & Atorvastatin. Patient denies myalgias or other med SE's. Last Lipids were at goal :   Lab Results  Component Value Date   CHOL 120 07/14/2022   HDL 52 07/14/2022   LDLCALC 54 07/14/2022   TRIG 67 07/14/2022   CHOLHDL 2.3 07/14/2022        Also, the patient has history of Morbid Obesity (BMI 40+) and  PreDiabetes (A1c 5.7%/2009 & 5.8%/2012) and has had no symptoms of reactive hypoglycemia, diabetic polys, paresthesias or visual blurring.  His weight is down 11# / 3 months   since on [facility] ( July 3)  . Last A1c was near goal :  Lab Results  Component Value Date   HGBA1C 5.9 (H) 07/14/2022   Wt Readings from Last 3 Encounters:  10/14/22 249 lb 6.4 oz (113.1 kg)  07/14/22 259 lb 6.4 oz (117.7 kg)  04/06/22 260 lb 6.4 oz (118.1 kg)        Further, the patient also has history of Vitamin D Deficiency ("21"/2008) and supplements vitamin D without any suspected side-effects. Last vitamin D was not  at goal (70-100) :   Lab Results  Component Value Date   VD25OH 61 04/06/2022       Current Outpatient Medications   Medication Instructions   Albuterol  HFA  inhaler 2 inhalation, Every 6 hours PRN   aspirin EC   81 mg Daily   atorvastatin (LIPITOR) 20 MG tablet TAKE 1 TABLET  DAILY    benazepril-hctz 20-25 MG tablet TAKE ONE TABLET  DAILY    BREZTRI AERO 160-9-4.8 \ Use  2 Inhalations    2 x /day (every 12 hours)   Magnesium   500 mg , Oral, Daily   varenicline (CHANTIX CONTINUING MONTH PAK) 1 MG tablet Take  1 tablet 2 x / day for Smoking Cessation   Vitamin D   5,000 Units , Oral, Daily   Wegovy 1.7 mg,     Subcut, Weekly     Allergies  Allergen Reactions   Codeine Other (See Comments)  REACTION: Itching   Isovue [Iopamidol] Hives    Pt. Developed 1 hive after injection;  Dr. 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   COPD (chronic obstructive pulmonary disease) (HCC)    Elevated hemoglobin A1c    Elevated PSA 03/16/2016   Hypertension    Personal history of colonic adenomas 12/28/2007   Vitamin D deficiency    Immunization History  Administered Date(s) Administered   Influenza,inj,Quad  12/27/2017, 12/15/2018   Janssen (J&J) SARS-COV-2 Vacc 03/27/2019   PPD Test 03/21/2020, 03/31/2021   Pneumococcal - 23 01/11/1997, 12/27/2017   Td 01/12/2004   Tdap 07/09/2016   Zoster, Live 01/12/2008   Past Surgical History:  Procedure Laterality Date   COLONOSCOPY  2009   CG  hems, TAs   LYMPHADENECTOMY N/A 06/28/2016   Procedure: LYMPHADENECTOMY;  Surgeon: Heloise Purpura, MD;  Location: [facility];  Service: Urology;  Laterality: N/A;   PROSTATE BIOPSY  03/16/2016   Surgical Pathology Gross and Microscopic Examination for Prostate Needle   ROBOT ASSISTED LAPAROSCOPIC RADICAL PROSTATECTOMY N/A 06/28/2016   Procedure: XI ROBOTIC ASSISTED LAPAROSCOPIC RADICAL PROSTATECTOMY LEVEL 2 EXCISION OF PENILE LESION;  Surgeon: Heloise Purpura, MD;  Location: [facility];  Service: Urology;  Laterality: N/A;    FHx:    Reviewed / unchanged   SHx:    Reviewed / unchanged    Systems Review:   Constitutional: Denies fever, chills, wt changes, headaches, insomnia, fatigue, night sweats, change in appetite. Eyes: Denies redness, blurred vision, diplopia, discharge, itchy, watery eyes.  ENT: Denies discharge, congestion, post nasal drip, epistaxis, sore throat, earache, hearing loss, dental pain, tinnitus, vertigo, sinus pain, snoring.  CV: Denies chest pain, palpitations, irregular heartbeat, syncope, dyspnea, diaphoresis, orthopnea, PND, claudication or edema. Respiratory: denies cough, dyspnea, DOE, pleurisy, hoarseness, laryngitis, wheezing.  Gastrointestinal: Denies dysphagia, odynophagia, heartburn, reflux, water brash, abdominal pain or cramps, nausea, vomiting, bloating, diarrhea, constipation, hematemesis, melena, hematochezia  or hemorrhoids. Genitourinary: Denies dysuria, frequency, urgency, nocturia, hesitancy, discharge, hematuria or flank pain. Musculoskeletal: Denies arthralgias, myalgias, stiffness, jt. swelling, pain, limping or strain/sprain.  Skin: Denies pruritus, rash, hives, warts, acne, eczema or change in skin lesion(s). Neuro: No weakness, tremor, incoordination, spasms, paresthesia or pain. Psychiatric: Denies confusion, memory loss or sensory loss. Endo: Denies change in weight, skin or hair change.  Heme/Lymph: No excessive bleeding, bruising or enlarged lymph nodes.  Physical Exam  BP 122/70   Pulse 73   Temp 97.9 F (36.6 C)   Resp 16   Ht 5\' 6"  (1.676 m)   Wt 249 lb 6.4 oz (113.1 kg)   SpO2 96%   BMI 40.25 kg/m   Appears  over nourished and in no distress.  Eyes: PERRLA, EOMs, conjunctiva no swelling or erythema. Sinuses: No frontal/maxillary tenderness ENT/Mouth: EAC's clear, TM's nl w/o erythema, bulging. Nares clear w/o erythema, swelling, exudates. Oropharynx clear without erythema  or exudates. Oral hygiene is good. Tongue normal, non obstructing. Hearing intact.  Neck: Supple. Thyroid not palpable. Car 2+/2+ without bruits, nodes or JVD. Chest: Respirations nl with BS clear & equal w/o rales, rhonchi, wheezing or stridor.  Cor: Heart sounds normal w/ regular rate and rhythm without sig. murmurs,  gallops, clicks or rubs. Peripheral pulses normal and equal  without edema.  Abdomen: Soft & bowel sounds normal. Non-tender w/o guarding, rebound, hernias, masses or organomegaly.  Lymphatics: Unremarkable.  Musculoskeletal: Full ROM all peripheral extremities, joint stability, 5/5 strength and normal gait.  Skin: Warm, dry without exposed rashes, lesions or ecchymosis apparent.  Neuro: Cranial nerves intact, reflexes equal bilaterally. Sensory-motor testing grossly intact. Tendon reflexes grossly intact.  Pysch: Alert & oriented x 3.  Insight and judgement nl & appropriate. No ideations.  Assessment and Plan:  1. Essential hypertension  - Continue medication, monitor blood pressure at home.  - Continue DASH diet.  Reminder to go to the ER if any CP,  SOB, nausea, dizziness, severe HA, changes vision/speech.  - COMPLETE METABOLIC PANEL WITH GFR  2. Hyperlipidemia, mixed  - Continue diet/meds, exercise,& lifestyle modifications.  - Continue monitor periodic cholesterol/liver & renal functions   3. Prediabetes  - Continue diet, exercise, lifestyle modifications.  - Monitor appropriate labs.  - COMPLETE METABOLIC PANEL WITH GFR  4. Vitamin D deficiency  - Continue supplementation.  5. Smoker  - encouraged smoking cessation  6. Medication management  - COMPLETE METABOLIC PANEL WITH GFR       Discussed  regular exercise, BP monitoring, weight control to achieve/maintain BMI less than 25 and discussed med and SE's. Recommended labs to assess and monitor clinical status with further disposition pending results of labs. Over 30 minutes of exam, counseling, chart  review was performed.Marland Kitchen

## 2022-10-15 LAB — COMPLETE METABOLIC PANEL WITH GFR
AG Ratio: 1.6 (calc) (ref 1.0–2.5)
ALT: 22 U/L (ref 9–46)
AST: 18 U/L (ref 10–35)
Albumin: 4.2 g/dL (ref 3.6–5.1)
Alkaline phosphatase (APISO): 72 U/L (ref 35–144)
BUN/Creatinine Ratio: 22 (calc) (ref 6–22)
BUN: 12 mg/dL (ref 7–25)
CO2: 32 mmol/L (ref 20–32)
Calcium: 9.4 mg/dL (ref 8.6–10.3)
Chloride: 96 mmol/L — ABNORMAL LOW (ref 98–110)
Creat: 0.55 mg/dL — ABNORMAL LOW (ref 0.70–1.35)
Globulin: 2.6 g/dL (ref 1.9–3.7)
Glucose, Bld: 93 mg/dL (ref 65–99)
Potassium: 3.9 mmol/L (ref 3.5–5.3)
Sodium: 134 mmol/L — ABNORMAL LOW (ref 135–146)
Total Bilirubin: 0.6 mg/dL (ref 0.2–1.2)
Total Protein: 6.8 g/dL (ref 6.1–8.1)
eGFR: 110 mL/min/{1.73_m2} (ref >=60)

## 2022-10-15 LAB — CBC WITH DIFFERENTIAL/PLATELET
Absolute Monocytes: 931 {cells}/uL (ref 200–950)
Basophils Absolute: 49 {cells}/uL (ref 0–200)
Basophils Relative: 0.5 %
Eosinophils Absolute: 186 {cells}/uL (ref 15–500)
Eosinophils Relative: 1.9 %
HCT: 46.1 % (ref 38.5–50.0)
Hemoglobin: 15.3 g/dL (ref 13.2–17.1)
Lymphs Abs: 2401 {cells}/uL (ref 850–3900)
MCH: 32.2 pg (ref 27.0–33.0)
MCHC: 33.2 g/dL (ref 32.0–36.0)
MCV: 97.1 fL (ref 80.0–100.0)
MPV: 9.4 fL (ref 7.5–12.5)
Monocytes Relative: 9.5 %
Neutro Abs: 6233 {cells}/uL (ref 1500–7800)
Neutrophils Relative %: 63.6 %
Platelets: 228 10*3/uL (ref 140–400)
RBC: 4.75 10*6/uL (ref 4.20–5.80)
RDW: 11.8 % (ref 11.0–15.0)
Total Lymphocyte: 24.5 %
WBC: 9.8 10*3/uL (ref 3.8–10.8)

## 2022-10-15 LAB — LIPID PANEL
Cholesterol: 124 mg/dL (ref <200)
HDL: 54 mg/dL (ref >=40)
LDL Cholesterol (Calc): 53 mg/dL
Non-HDL Cholesterol (Calc): 70 mg/dL (ref <130)
Total CHOL/HDL Ratio: 2.3 (calc) (ref <5.0)
Triglycerides: 85 mg/dL (ref <150)

## 2022-10-15 LAB — HEMOGLOBIN A1C
Hgb A1c MFr Bld: 5.6 %{Hb} (ref <5.7)
Mean Plasma Glucose: 114 mg/dL
eAG (mmol/L): 6.3 mmol/L

## 2022-10-15 LAB — INSULIN, RANDOM: Insulin: 38.1 u[IU]/mL — ABNORMAL HIGH

## 2022-10-15 LAB — MAGNESIUM: Magnesium: 1.9 mg/dL (ref 1.5–2.5)

## 2022-10-15 LAB — TSH: TSH: 0.97 m[IU]/L (ref 0.40–4.50)

## 2022-10-15 LAB — VITAMIN D 25 HYDROXY (VIT D DEFICIENCY, FRACTURES): Vit D, 25-Hydroxy: 66 ng/mL (ref 30–100)

## 2022-10-16 NOTE — Progress Notes (Signed)
<>*<>*<>*<>*<>*<>*<>*<>*<>*<>*<>*<>*<>*<>*<>*<>*<>*<>*<>*<>*<>*<>*<>*<>*<> <>*<>*<>*<>*<>*<>*<>*<>*<>*<>*<>*<>*<>*<>*<>*<>*<>*<>*<>*<>*<>*<>*<>*<>*<>  -    Test results slightly outside the reference range are not unusual. If there is anything important, I will review this with you,  otherwise it is considered normal test values.  If you have further questions,  please do not hesitate to contact me at the office or via My Chart.   <>*<>*<>*<>*<>*<>*<>*<>*<>*<>*<>*<>*<>*<>*<>*<>*<>*<>*<>*<>*<>*<>*<>*<>*<> <>*<>*<>*<>*<>*<>*<>*<>*<>*<>*<>*<>*<>*<>*<>*<>*<>*<>*<>*<>*<>*<>*<>*<>*<>  -   Chol = 124  Excellent   - Very low risk for Heart Attack  / Stroke <>*<>*<>*<>*<>*<>*<>*<>*<>*<>*<>*<>*<>*<>*<>*<>*<>*<>*<>*<>*<>*<>*<>*<>*<>  -  A1c is  back to normal Non-Diabetic range - Great !  <>*<>*<>*<>*<>*<>*<>*<>*<>*<>*<>*<>*<>*<>*<>*<>*<>*<>*<>*<>*<>*<>*<>*<>*<>  -  Vitamin D = 66 - Excellent  !     Please keep dosage same <>*<>*<>*<>*<>*<>*<>*<>*<>*<>*<>*<>*<>*<>*<>*<>*<>*<>*<>*<>*<>*<>*<>*<>*<>  -  All Else - CBC - Kidneys - Electrolytes - Liver - Magnesium & Thyroid    - all  Normal / OK <>*<>*<>*<>*<>*<>*<>*<>*<>*<>*<>*<>*<>*<>*<>*<>*<>*<>*<>*<>*<>*<>*<>*<>*<> <>*<>*<>*<>*<>*<>*<>*<>*<>*<>*<>*<>*<>*<>*<>*<>*<>*<>*<>*<>*<>*<>*<>*<>*<>

## 2022-10-18 DIAGNOSIS — J441 Chronic obstructive pulmonary disease with (acute) exacerbation: Secondary | ICD-10-CM | POA: Diagnosis not present

## 2022-10-18 DIAGNOSIS — A419 Sepsis, unspecified organism: Secondary | ICD-10-CM | POA: Diagnosis not present

## 2022-10-18 DIAGNOSIS — J189 Pneumonia, unspecified organism: Secondary | ICD-10-CM | POA: Diagnosis not present

## 2022-10-19 DIAGNOSIS — S51802A Unspecified open wound of left forearm, initial encounter: Secondary | ICD-10-CM | POA: Diagnosis not present

## 2022-10-20 DIAGNOSIS — L814 Other melanin hyperpigmentation: Secondary | ICD-10-CM | POA: Diagnosis not present

## 2022-10-20 DIAGNOSIS — L821 Other seborrheic keratosis: Secondary | ICD-10-CM | POA: Diagnosis not present

## 2022-10-20 DIAGNOSIS — D225 Melanocytic nevi of trunk: Secondary | ICD-10-CM | POA: Diagnosis not present

## 2022-10-20 DIAGNOSIS — Z08 Encounter for follow-up examination after completed treatment for malignant neoplasm: Secondary | ICD-10-CM | POA: Diagnosis not present

## 2022-11-18 DIAGNOSIS — J441 Chronic obstructive pulmonary disease with (acute) exacerbation: Secondary | ICD-10-CM | POA: Diagnosis not present

## 2022-11-18 DIAGNOSIS — A419 Sepsis, unspecified organism: Secondary | ICD-10-CM | POA: Diagnosis not present

## 2022-11-18 DIAGNOSIS — J189 Pneumonia, unspecified organism: Secondary | ICD-10-CM | POA: Diagnosis not present

## 2022-12-18 DIAGNOSIS — A419 Sepsis, unspecified organism: Secondary | ICD-10-CM | POA: Diagnosis not present

## 2022-12-18 DIAGNOSIS — J189 Pneumonia, unspecified organism: Secondary | ICD-10-CM | POA: Diagnosis not present

## 2022-12-18 DIAGNOSIS — J441 Chronic obstructive pulmonary disease with (acute) exacerbation: Secondary | ICD-10-CM | POA: Diagnosis not present

## 2022-12-25 ENCOUNTER — Other Ambulatory Visit: Payer: Self-pay | Admitting: Internal Medicine

## 2022-12-25 DIAGNOSIS — I10 Essential (primary) hypertension: Secondary | ICD-10-CM

## 2022-12-25 MED ORDER — BENAZEPRIL-HYDROCHLOROTHIAZIDE 20-25 MG PO TABS
ORAL_TABLET | ORAL | 3 refills | Status: DC
Start: 2022-12-25 — End: 2023-11-04

## 2023-01-09 ENCOUNTER — Other Ambulatory Visit: Payer: Self-pay | Admitting: Internal Medicine

## 2023-01-09 DIAGNOSIS — J449 Chronic obstructive pulmonary disease, unspecified: Secondary | ICD-10-CM

## 2023-01-09 MED ORDER — BUDESONIDE-FORMOTEROL FUMARATE 160-4.5 MCG/ACT IN AERO
INHALATION_SPRAY | RESPIRATORY_TRACT | 3 refills | Status: DC
Start: 2023-01-09 — End: 2023-05-07

## 2023-01-18 ENCOUNTER — Ambulatory Visit (INDEPENDENT_AMBULATORY_CARE_PROVIDER_SITE_OTHER): Payer: BC Managed Care – PPO | Admitting: Nurse Practitioner

## 2023-01-18 ENCOUNTER — Encounter: Payer: Self-pay | Admitting: Nurse Practitioner

## 2023-01-18 VITALS — BP 112/68 | HR 71 | Temp 97.7°F | Ht 66.0 in | Wt 240.6 lb

## 2023-01-18 DIAGNOSIS — E782 Mixed hyperlipidemia: Secondary | ICD-10-CM | POA: Diagnosis not present

## 2023-01-18 DIAGNOSIS — R7303 Prediabetes: Secondary | ICD-10-CM | POA: Diagnosis not present

## 2023-01-18 DIAGNOSIS — E669 Obesity, unspecified: Secondary | ICD-10-CM

## 2023-01-18 DIAGNOSIS — I10 Essential (primary) hypertension: Secondary | ICD-10-CM | POA: Diagnosis not present

## 2023-01-18 DIAGNOSIS — Z79899 Other long term (current) drug therapy: Secondary | ICD-10-CM

## 2023-01-18 DIAGNOSIS — C61 Malignant neoplasm of prostate: Secondary | ICD-10-CM

## 2023-01-18 DIAGNOSIS — J441 Chronic obstructive pulmonary disease with (acute) exacerbation: Secondary | ICD-10-CM | POA: Diagnosis not present

## 2023-01-18 DIAGNOSIS — A419 Sepsis, unspecified organism: Secondary | ICD-10-CM | POA: Diagnosis not present

## 2023-01-18 DIAGNOSIS — E559 Vitamin D deficiency, unspecified: Secondary | ICD-10-CM

## 2023-01-18 DIAGNOSIS — J189 Pneumonia, unspecified organism: Secondary | ICD-10-CM | POA: Diagnosis not present

## 2023-01-18 DIAGNOSIS — E349 Endocrine disorder, unspecified: Secondary | ICD-10-CM

## 2023-01-18 NOTE — Progress Notes (Signed)
 FOLLOW UP 3 MONTH  Assessment and Plan:   Baer was seen today for follow-up.  Diagnoses and all orders for this visit:  1. Essential hypertension Controlled  Continue medications;  Discussed DASH (Dietary Approaches to Stop Hypertension) DASH diet is lower in sodium than a typical American diet. Cut back on foods that are high in saturated fat, cholesterol, and trans fats. Eat more whole-grain foods, fish, poultry, and nuts Remain active and exercise as tolerated daily.  Monitor BP at home-Call if greater than 130/80.    2. COPD, severe (HCC)/Smoker Trelegy sample provided Stop  Breztri  Continue SABA PRN Smoking cessation discussed.  3. Malignant neoplasm of prostate (HCC) Symptoms controlled Continue to monitor  4. Hyperlipidemia, mixed Controlled Continue medications;  Discussed lifestyle modifications. Recommended diet heavy in fruits and veggies, omega 3's. Decrease consumption of animal meats, cheeses, and dairy products. Remain active and exercise as tolerated. Continue to monitor.  5. Vitamin D  deficiency Above normal but not at goal. Continue supplement  6. Testosterone  deficiency Continue to monitor  7. Obesity (BMI 35.55) Discussed appropriate BMI Continue Semaglutide  - dosage increased Diet modification. Physical activity. Encouraged/praised to build confidence.  9. Prediabetes Education: Reviewed 'ABCs' of diabetes management  Discussed goals to be met and/or maintained include A1C (<7) Blood pressure (<130/80) Cholesterol (LDL <70) Continue Eye Exam yearly  Continue Dental Exam Q6 mo Discussed dietary recommendations Discussed Physical Activity recommendations Check A1C  Medication management All medications discussed and reviewed in full. All questions and concerns regarding medications addressed.    Orders Placed This Encounter  Procedures   CBC with Differential/Platelet   COMPLETE METABOLIC PANEL WITH GFR   Lipid panel    Hemoglobin A1c   Notify office for further evaluation and treatment, questions or concerns if any reported s/s fail to improve.   The patient was advised to call back or seek an in-person evaluation if any symptoms worsen or if the condition fails to improve as anticipated.   Further disposition pending results of labs. Discussed med's effects and SE's.    I discussed the assessment and treatment plan with the patient. The patient was provided an opportunity to ask questions and all were answered. The patient agreed with the plan and demonstrated an understanding of the instructions.  Discussed med's effects and SE's. Screening labs and tests as requested with regular follow-up as recommended.  I provided 30 minutes of face-to-face time during this encounter including counseling, chart review, and critical decision making was preformed.   Future Appointments  Date Time Provider Department Center  05/05/2023  2:00 PM Tonita Fallow, MD [facility] None    ----------------------------------------------------------------------------------------------------------------------  HPI 68 y.o. male  presents for 3 month follow up on HTN, HLD, abnormal glucose with history of pre-diabetes, weight and vitamin D  deficiency.   Reports overall he is doing well.  He has no new concerns at this time.  He is a current everyday smoker, trying to cut down, not interested in quitting fully.  Follows with Dermatology annually - no new concerns.   BMI is Body mass index is 38.83 kg/m., he has not been working on diet and exercise.  Wt Readings from Last 3 Encounters:  01/18/23 240 lb 9.6 oz (109.1 kg)  10/14/22 249 lb 6.4 oz (113.1 kg)  07/14/22 259 lb 6.4 oz (117.7 kg)    His blood pressure has been controlled at home, today their BP is BP: 112/68  He does not workout. He denies any cardiac symptoms, chest pains, palpitations, shortness  of breath, dizziness or lower extremity edema.      He is  on cholesterol medication Atorvastatin  and denies myalgias.   His cholesterol is at goal. The cholesterol last visit was:   Lab Results  Component Value Date   CHOL 124 10/14/2022   HDL 54 10/14/2022   LDLCALC 53 10/14/2022   TRIG 85 10/14/2022   CHOLHDL 2.3 10/14/2022    He has not been working on diet and exercise for prediabetes, and denies polydipsia, polyuria, visual disturbances, vomiting and weight loss. Last A1C in the office was:  Lab Results  Component Value Date   HGBA1C 5.6 10/14/2022     Patient does not have history of CKD.  Their last GFR was:  Lab Results  Component Value Date   GFRNONAA >60 02/17/2022     Patient is not on Vitamin D  supplement for deficiency. Lab Results  Component Value Date   VD25OH 66 10/14/2022        Current Medications:  Current Outpatient Medications on File Prior to Visit  Medication Sig   albuterol  (VENTOLIN  HFA) 108 (90 Base) MCG/ACT inhaler Inhale 2 puffs into the lungs every 6 (six) hours as needed for wheezing or shortness of breath.   aspirin  EC 81 MG tablet Take 81 mg by mouth daily.   atorvastatin  (LIPITOR) 20 MG tablet TAKE 1 TABLET BY MOUTH DAILY FOR CHOLESTEROL   benazepril -hydrochlorthiazide (LOTENSIN  HCT) 20-25 MG tablet Take 1 tablet Daily for BP                                               /                                                                   TAKE                                         BY                                                 MOUTH   Cholecalciferol (VITAMIN D ) 125 MCG (5000 UT) CAPS Take 5,000 Units by mouth daily.   Magnesium  500 MG TABS Take 500 mg by mouth daily.   Semaglutide -Weight Management (WEGOVY ) 1.7 MG/0.75ML SOAJ Inject 1.7 mg into the skin once a week.   budesonide -formoterol  (SYMBICORT ) 160-4.5 MCG/ACT inhaler Use  2 inhalations  30 minutes apart  Once Daily  for COPD (Patient not taking: Reported on 01/18/2023)   varenicline  (CHANTIX  CONTINUING MONTH PAK) 1 MG tablet Take  1  tablet 2 x / day for Smoking Cessation (Patient not taking: Reported on 01/18/2023)   No current facility-administered medications on file prior to visit.    Allergies:  Allergies  Allergen Reactions   Codeine Other (See Comments)    REACTION: Itching   Isovue  [Iopamidol ] Hives    Pt. Developed 1 hive after  injection; Dr. 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   COPD (chronic obstructive pulmonary disease) (HCC)    Elevated hemoglobin A1c    Elevated PSA 03/16/2016   Hypertension    Personal history of colonic adenomas 12/28/2007   Vitamin D  deficiency      Family history- Reviewed and unchanged Family History  Problem Relation Age of Onset   Cancer Mother        lung   Lung disease Neg Hx    Colon cancer Neg Hx    Rectal cancer Neg Hx    Stomach cancer Neg Hx      Social history- Reviewed and unchanged Social History   Tobacco Use   Smoking status: Every Day    Current packs/day: 0.50    Average packs/day: 0.5 packs/day for 40.0 years (20.0 ttl pk-yrs)    Types: Cigarettes   Smokeless tobacco: Never   Tobacco comments:    has decreased smoking significantly with goal of quitting  Vaping Use   Vaping status: Never Used  Substance Use Topics   Alcohol use: Yes    Alcohol/week: 4.0 standard drinks of alcohol    Types: 4 Cans of beer per week    Comment: occasionally   Drug use: No    Patient Care Team: Tonita Fallow, MD as PCP - General (Internal Medicine) Amadeo Windell SAILOR, MD (Inactive) as Consulting Physician (Oncology) Patrcia Cough, MD as Consulting Physician (Radiation Oncology)    Screening Tests: Immunization History  Administered Date(s) Administered   Influenza, High Dose Seasonal PF 10/14/2022   Influenza,inj,Quad PF,6+ Mos 12/27/2017, 12/15/2018, 01/07/2022   Janssen (J&J) SARS-COV-2  Vaccination 03/27/2019   PPD Test 03/21/2020, 03/31/2021   Pneumococcal Polysaccharide-23 01/11/1997, 12/27/2017   Td 01/12/2004   Tdap 07/09/2016   Zoster, Live 01/12/2008     Review of Systems  Constitutional:  Negative for chills, fever and weight loss.  HENT:  Negative for congestion and hearing loss.   Eyes:  Negative for blurred vision and double vision.  Respiratory:  Negative for cough and shortness of breath.   Cardiovascular:  Negative for chest pain, palpitations, orthopnea and leg swelling.  Gastrointestinal:  Negative for abdominal pain, constipation, diarrhea, heartburn, nausea and vomiting.  Genitourinary:  Negative for dysuria.  Musculoskeletal:  Negative for falls, joint pain and myalgias.  Skin:  Negative for rash.  Neurological:  Negative for dizziness, tingling, tremors, loss of consciousness and headaches.  Endo/Heme/Allergies:  Does not bruise/bleed easily.  Psychiatric/Behavioral:  Negative for depression, memory loss and suicidal ideas.     Physical Exam: BP 112/68   Pulse 71   Temp 97.7 F (36.5 C)   Ht 5' 6 (1.676 m)   Wt 240 lb 9.6 oz (109.1 kg)   SpO2 97%   BMI 38.83 kg/m  Wt Readings from Last 3 Encounters:  01/18/23 240 lb 9.6 oz (109.1 kg)  10/14/22 249 lb 6.4 oz (113.1 kg)  07/14/22 259 lb 6.4 oz (117.7 kg)   General Appearance: Well nourished, in no apparent distress. Eyes: PERRLA, EOMs, conjunctiva no swelling or erythema Sinuses: No Frontal/maxillary tenderness ENT/Mouth: Ext aud canals clear, TMs without erythema, bulging. No erythema, swelling, or exudate on post pharynx.  Tonsils not swollen or erythematous. Hearing normal.  Neck: Supple, thyroid  normal.  Respiratory: Respiratory effort normal, BS equal bilaterally without  rales, rhonchi, wheezing or stridor.  Cardio: RRR with no MRGs. Brisk peripheral pulses without edema.  Abdomen: Soft, + BS.  Non tender, no guarding, rebound, hernias, masses. Lymphatics: Non tender without  lymphadenopathy.  Musculoskeletal: Full ROM, 5/5 strength, Normal gait Skin: Appropriate color for ethnicity. Warm.  Area cleansed with alcohol snipped with scissors and silver nitrate applied Neuro: Cranial nerves intact. No cerebellar symptoms.  Psych: Awake and oriented X 3, normal affect, Insight and Judgment appropriate.   Bascom Necessary, NP [facility]

## 2023-01-19 LAB — CBC WITH DIFFERENTIAL/PLATELET
Absolute Lymphocytes: 2901 {cells}/uL (ref 850–3900)
Absolute Monocytes: 1254 {cells}/uL — ABNORMAL HIGH (ref 200–950)
Basophils Absolute: 45 {cells}/uL (ref 0–200)
Basophils Relative: 0.4 %
Eosinophils Absolute: 213 {cells}/uL (ref 15–500)
Eosinophils Relative: 1.9 %
HCT: 46.3 % (ref 38.5–50.0)
Hemoglobin: 15.4 g/dL (ref 13.2–17.1)
MCH: 32.2 pg (ref 27.0–33.0)
MCHC: 33.3 g/dL (ref 32.0–36.0)
MCV: 96.7 fL (ref 80.0–100.0)
MPV: 9.5 fL (ref 7.5–12.5)
Monocytes Relative: 11.2 %
Neutro Abs: 6787 {cells}/uL (ref 1500–7800)
Neutrophils Relative %: 60.6 %
Platelets: 228 10*3/uL (ref 140–400)
RBC: 4.79 10*6/uL (ref 4.20–5.80)
RDW: 11.5 % (ref 11.0–15.0)
Total Lymphocyte: 25.9 %
WBC: 11.2 10*3/uL — ABNORMAL HIGH (ref 3.8–10.8)

## 2023-01-19 LAB — COMPLETE METABOLIC PANEL WITH GFR
AG Ratio: 1.7 (calc) (ref 1.0–2.5)
ALT: 19 U/L (ref 9–46)
AST: 18 U/L (ref 10–35)
Albumin: 4.3 g/dL (ref 3.6–5.1)
Alkaline phosphatase (APISO): 68 U/L (ref 35–144)
BUN/Creatinine Ratio: 23 (calc) — ABNORMAL HIGH (ref 6–22)
BUN: 15 mg/dL (ref 7–25)
CO2: 33 mmol/L — ABNORMAL HIGH (ref 20–32)
Calcium: 9.7 mg/dL (ref 8.6–10.3)
Chloride: 97 mmol/L — ABNORMAL LOW (ref 98–110)
Creat: 0.65 mg/dL — ABNORMAL LOW (ref 0.70–1.35)
Globulin: 2.6 g/dL (ref 1.9–3.7)
Glucose, Bld: 78 mg/dL (ref 65–99)
Potassium: 4.2 mmol/L (ref 3.5–5.3)
Sodium: 136 mmol/L (ref 135–146)
Total Bilirubin: 0.6 mg/dL (ref 0.2–1.2)
Total Protein: 6.9 g/dL (ref 6.1–8.1)
eGFR: 105 mL/min/{1.73_m2} (ref >=60)

## 2023-01-19 LAB — LIPID PANEL
Cholesterol: 127 mg/dL (ref <200)
HDL: 54 mg/dL (ref >=40)
LDL Cholesterol (Calc): 58 mg/dL
Non-HDL Cholesterol (Calc): 73 mg/dL (ref <130)
Total CHOL/HDL Ratio: 2.4 (calc) (ref <5.0)
Triglycerides: 69 mg/dL (ref <150)

## 2023-01-19 LAB — HEMOGLOBIN A1C
Hgb A1c MFr Bld: 5.6 %{Hb} (ref <5.7)
Mean Plasma Glucose: 114 mg/dL
eAG (mmol/L): 6.3 mmol/L

## 2023-01-20 NOTE — Patient Instructions (Signed)

## 2023-02-18 DIAGNOSIS — J441 Chronic obstructive pulmonary disease with (acute) exacerbation: Secondary | ICD-10-CM | POA: Diagnosis not present

## 2023-02-18 DIAGNOSIS — J189 Pneumonia, unspecified organism: Secondary | ICD-10-CM | POA: Diagnosis not present

## 2023-02-18 DIAGNOSIS — A419 Sepsis, unspecified organism: Secondary | ICD-10-CM | POA: Diagnosis not present

## 2023-02-24 DIAGNOSIS — J441 Chronic obstructive pulmonary disease with (acute) exacerbation: Secondary | ICD-10-CM | POA: Diagnosis not present

## 2023-02-24 DIAGNOSIS — R062 Wheezing: Secondary | ICD-10-CM | POA: Diagnosis not present

## 2023-02-24 DIAGNOSIS — E785 Hyperlipidemia, unspecified: Secondary | ICD-10-CM | POA: Diagnosis not present

## 2023-02-24 DIAGNOSIS — I10 Essential (primary) hypertension: Secondary | ICD-10-CM | POA: Diagnosis not present

## 2023-03-14 ENCOUNTER — Other Ambulatory Visit: Payer: Self-pay

## 2023-03-14 MED ORDER — WEGOVY 1.7 MG/0.75ML ~~LOC~~ SOAJ: 1.7000 mg | SUBCUTANEOUS | 2 refills | Status: DC

## 2023-03-18 DIAGNOSIS — A419 Sepsis, unspecified organism: Secondary | ICD-10-CM | POA: Diagnosis not present

## 2023-03-18 DIAGNOSIS — J441 Chronic obstructive pulmonary disease with (acute) exacerbation: Secondary | ICD-10-CM | POA: Diagnosis not present

## 2023-03-18 DIAGNOSIS — J189 Pneumonia, unspecified organism: Secondary | ICD-10-CM | POA: Diagnosis not present

## 2023-03-23 ENCOUNTER — Other Ambulatory Visit (HOSPITAL_COMMUNITY): Payer: Self-pay

## 2023-03-23 ENCOUNTER — Telehealth: Payer: Self-pay

## 2023-03-23 NOTE — Telephone Encounter (Signed)
 Pharmacy Patient Advocate Encounter   Received notification from CoverMyMeds that prior authorization for [facility] 1.7MG /0.75ML auto-injectors is required/requested.   Insurance verification completed.   The patient is insured through [facility] .   Per test claim: PA required; PA submitted to above mentioned insurance via CoverMyMeds Key/confirmation #/EOC [id number] Status is pending

## 2023-03-25 ENCOUNTER — Other Ambulatory Visit (HOSPITAL_COMMUNITY): Payer: Self-pay

## 2023-03-25 NOTE — Telephone Encounter (Signed)
 Pharmacy Patient Advocate Encounter  Received notification from [facility] that Prior Authorization for [facility] 1.7MG /0.75ML auto-injectors has been APPROVED from 03/23/23 to 03/22/24. Ran test claim, Copay is $24.99. This test claim was processed through [facility]- copay amounts may vary at other pharmacies due to pharmacy/plan contracts, or as the patient moves through the different stages of their insurance plan.   PA #/Case ID/Reference #:  [id number]

## 2023-04-07 ENCOUNTER — Encounter: Payer: BC Managed Care – PPO | Admitting: Internal Medicine

## 2023-04-18 DIAGNOSIS — A419 Sepsis, unspecified organism: Secondary | ICD-10-CM | POA: Diagnosis not present

## 2023-04-18 DIAGNOSIS — J441 Chronic obstructive pulmonary disease with (acute) exacerbation: Secondary | ICD-10-CM | POA: Diagnosis not present

## 2023-04-18 DIAGNOSIS — J189 Pneumonia, unspecified organism: Secondary | ICD-10-CM | POA: Diagnosis not present

## 2023-04-20 ENCOUNTER — Encounter: Payer: BC Managed Care – PPO | Admitting: Internal Medicine

## 2023-04-20 DIAGNOSIS — L821 Other seborrheic keratosis: Secondary | ICD-10-CM | POA: Diagnosis not present

## 2023-04-20 DIAGNOSIS — D225 Melanocytic nevi of trunk: Secondary | ICD-10-CM | POA: Diagnosis not present

## 2023-04-20 DIAGNOSIS — L814 Other melanin hyperpigmentation: Secondary | ICD-10-CM | POA: Diagnosis not present

## 2023-04-20 DIAGNOSIS — Z08 Encounter for follow-up examination after completed treatment for malignant neoplasm: Secondary | ICD-10-CM | POA: Diagnosis not present

## 2023-05-02 ENCOUNTER — Inpatient Hospital Stay (HOSPITAL_BASED_OUTPATIENT_CLINIC_OR_DEPARTMENT_OTHER)
Admission: EM | Admit: 2023-05-02 | Discharge: 2023-05-07 | DRG: 435 | Disposition: A | Discharge status: Home / Self Care | Source: Ambulatory Visit | Attending: Student | Admitting: Student

## 2023-05-02 ENCOUNTER — Other Ambulatory Visit: Payer: Self-pay

## 2023-05-02 ENCOUNTER — Encounter (HOSPITAL_BASED_OUTPATIENT_CLINIC_OR_DEPARTMENT_OTHER): Payer: Self-pay | Admitting: Emergency Medicine

## 2023-05-02 ENCOUNTER — Emergency Department (HOSPITAL_BASED_OUTPATIENT_CLINIC_OR_DEPARTMENT_OTHER)

## 2023-05-02 DIAGNOSIS — R972 Elevated prostate specific antigen [PSA]: Secondary | ICD-10-CM | POA: Diagnosis present

## 2023-05-02 DIAGNOSIS — R110 Nausea: Secondary | ICD-10-CM | POA: Diagnosis not present

## 2023-05-02 DIAGNOSIS — C240 Malignant neoplasm of extrahepatic bile duct: Secondary | ICD-10-CM | POA: Diagnosis not present

## 2023-05-02 DIAGNOSIS — K529 Noninfective gastroenteritis and colitis, unspecified: Secondary | ICD-10-CM | POA: Diagnosis present

## 2023-05-02 DIAGNOSIS — E66812 Obesity, class 2: Secondary | ICD-10-CM | POA: Diagnosis not present

## 2023-05-02 DIAGNOSIS — C221 Intrahepatic bile duct carcinoma: Secondary | ICD-10-CM | POA: Diagnosis not present

## 2023-05-02 DIAGNOSIS — E785 Hyperlipidemia, unspecified: Secondary | ICD-10-CM | POA: Diagnosis not present

## 2023-05-02 DIAGNOSIS — N200 Calculus of kidney: Secondary | ICD-10-CM | POA: Diagnosis not present

## 2023-05-02 DIAGNOSIS — K2960 Other gastritis without bleeding: Secondary | ICD-10-CM | POA: Diagnosis not present

## 2023-05-02 DIAGNOSIS — R17 Unspecified jaundice: Secondary | ICD-10-CM | POA: Diagnosis present

## 2023-05-02 DIAGNOSIS — R590 Localized enlarged lymph nodes: Secondary | ICD-10-CM | POA: Diagnosis not present

## 2023-05-02 DIAGNOSIS — N320 Bladder-neck obstruction: Secondary | ICD-10-CM | POA: Diagnosis present

## 2023-05-02 DIAGNOSIS — Z7982 Long term (current) use of aspirin: Secondary | ICD-10-CM

## 2023-05-02 DIAGNOSIS — K439 Ventral hernia without obstruction or gangrene: Secondary | ICD-10-CM | POA: Diagnosis not present

## 2023-05-02 DIAGNOSIS — J449 Chronic obstructive pulmonary disease, unspecified: Secondary | ICD-10-CM | POA: Diagnosis not present

## 2023-05-02 DIAGNOSIS — I10 Essential (primary) hypertension: Secondary | ICD-10-CM | POA: Diagnosis not present

## 2023-05-02 DIAGNOSIS — C61 Malignant neoplasm of prostate: Secondary | ICD-10-CM | POA: Diagnosis not present

## 2023-05-02 DIAGNOSIS — K831 Obstruction of bile duct: Secondary | ICD-10-CM | POA: Diagnosis not present

## 2023-05-02 DIAGNOSIS — J439 Emphysema, unspecified: Secondary | ICD-10-CM | POA: Diagnosis not present

## 2023-05-02 DIAGNOSIS — R748 Abnormal levels of other serum enzymes: Secondary | ICD-10-CM | POA: Diagnosis present

## 2023-05-02 DIAGNOSIS — L299 Pruritus, unspecified: Secondary | ICD-10-CM | POA: Diagnosis present

## 2023-05-02 DIAGNOSIS — C229 Malignant neoplasm of liver, not specified as primary or secondary: Secondary | ICD-10-CM | POA: Diagnosis not present

## 2023-05-02 DIAGNOSIS — R162 Hepatomegaly with splenomegaly, not elsewhere classified: Secondary | ICD-10-CM | POA: Diagnosis not present

## 2023-05-02 DIAGNOSIS — R197 Diarrhea, unspecified: Secondary | ICD-10-CM | POA: Diagnosis not present

## 2023-05-02 DIAGNOSIS — Z8546 Personal history of malignant neoplasm of prostate: Secondary | ICD-10-CM | POA: Diagnosis not present

## 2023-05-02 DIAGNOSIS — F17210 Nicotine dependence, cigarettes, uncomplicated: Secondary | ICD-10-CM | POA: Diagnosis not present

## 2023-05-02 DIAGNOSIS — R9431 Abnormal electrocardiogram [ECG] [EKG]: Secondary | ICD-10-CM | POA: Diagnosis not present

## 2023-05-02 DIAGNOSIS — Z7951 Long term (current) use of inhaled steroids: Secondary | ICD-10-CM

## 2023-05-02 DIAGNOSIS — R7989 Other specified abnormal findings of blood chemistry: Secondary | ICD-10-CM | POA: Diagnosis present

## 2023-05-02 DIAGNOSIS — Z79899 Other long term (current) drug therapy: Secondary | ICD-10-CM

## 2023-05-02 DIAGNOSIS — I700 Atherosclerosis of aorta: Secondary | ICD-10-CM | POA: Diagnosis not present

## 2023-05-02 DIAGNOSIS — E871 Hypo-osmolality and hyponatremia: Secondary | ICD-10-CM | POA: Diagnosis present

## 2023-05-02 DIAGNOSIS — K429 Umbilical hernia without obstruction or gangrene: Secondary | ICD-10-CM | POA: Diagnosis present

## 2023-05-02 DIAGNOSIS — R933 Abnormal findings on diagnostic imaging of other parts of digestive tract: Secondary | ICD-10-CM | POA: Diagnosis not present

## 2023-05-02 DIAGNOSIS — Z7985 Long-term (current) use of injectable non-insulin antidiabetic drugs: Secondary | ICD-10-CM

## 2023-05-02 DIAGNOSIS — E119 Type 2 diabetes mellitus without complications: Secondary | ICD-10-CM | POA: Diagnosis present

## 2023-05-02 DIAGNOSIS — R109 Unspecified abdominal pain: Secondary | ICD-10-CM | POA: Diagnosis not present

## 2023-05-02 DIAGNOSIS — K2970 Gastritis, unspecified, without bleeding: Secondary | ICD-10-CM | POA: Diagnosis present

## 2023-05-02 DIAGNOSIS — M1990 Unspecified osteoarthritis, unspecified site: Secondary | ICD-10-CM | POA: Diagnosis present

## 2023-05-02 DIAGNOSIS — J4489 Other specified chronic obstructive pulmonary disease: Secondary | ICD-10-CM | POA: Diagnosis not present

## 2023-05-02 DIAGNOSIS — Z9079 Acquired absence of other genital organ(s): Secondary | ICD-10-CM

## 2023-05-02 DIAGNOSIS — Z6832 Body mass index (BMI) 32.0-32.9, adult: Secondary | ICD-10-CM

## 2023-05-02 DIAGNOSIS — K838 Other specified diseases of biliary tract: Secondary | ICD-10-CM | POA: Diagnosis not present

## 2023-05-02 DIAGNOSIS — E876 Hypokalemia: Secondary | ICD-10-CM | POA: Diagnosis not present

## 2023-05-02 LAB — CBC
HCT: 39.6 % (ref 39.0–52.0)
Hemoglobin: 14.8 g/dL (ref 13.0–17.0)
MCH: 33.9 pg (ref 26.0–34.0)
MCHC: 37.4 g/dL — ABNORMAL HIGH (ref 30.0–36.0)
MCV: 90.8 fL (ref 80.0–100.0)
Platelets: 190 10*3/uL (ref 150–400)
RBC: 4.36 MIL/uL (ref 4.22–5.81)
RDW: 15.7 % — ABNORMAL HIGH (ref 11.5–15.5)
WBC: 10 10*3/uL (ref 4.0–10.5)
nRBC: 0 % (ref 0.0–0.2)

## 2023-05-02 LAB — COMPREHENSIVE METABOLIC PANEL WITH GFR
ALT: 210 U/L — ABNORMAL HIGH (ref 0–44)
AST: 190 U/L — ABNORMAL HIGH (ref 15–41)
Albumin: 3.8 g/dL (ref 3.5–5.0)
Alkaline Phosphatase: 348 U/L — ABNORMAL HIGH (ref 38–126)
Anion gap: 11 (ref 5–15)
BUN: 18 mg/dL (ref 8–23)
CO2: 26 mmol/L (ref 22–32)
Calcium: 9.7 mg/dL (ref 8.9–10.3)
Chloride: 94 mmol/L — ABNORMAL LOW (ref 98–111)
Creatinine, Ser: 0.87 mg/dL (ref 0.61–1.24)
GFR, Estimated: >60 mL/min (ref >60)
Glucose, Bld: 99 mg/dL (ref 70–99)
Potassium: 3.5 mmol/L (ref 3.5–5.1)
Sodium: 131 mmol/L — ABNORMAL LOW (ref 135–145)
Total Bilirubin: 17 mg/dL — ABNORMAL HIGH (ref 0.0–1.2)
Total Protein: 6.8 g/dL (ref 6.5–8.1)

## 2023-05-02 LAB — HEPATITIS PANEL, ACUTE
HCV Ab: NONREACTIVE
Hep A IgM: NONREACTIVE
Hep B C IgM: NONREACTIVE
Hepatitis B Surface Ag: NONREACTIVE

## 2023-05-02 LAB — LIPASE, BLOOD: Lipase: 16 U/L (ref 11–51)

## 2023-05-02 LAB — BILIRUBIN, DIRECT: Bilirubin, Direct: 9.6 mg/dL — ABNORMAL HIGH (ref 0.0–0.2)

## 2023-05-02 MED ORDER — IOHEXOL 300 MG/ML  SOLN
100.0000 mL | Freq: Once | INTRAMUSCULAR | Status: AC | PRN
  Administered 2023-05-02: 100 mL via INTRAVENOUS

## 2023-05-02 MED ORDER — DIPHENHYDRAMINE HCL 50 MG/ML IJ SOLN
50.0000 mg | Freq: Once | INTRAMUSCULAR | Status: AC
  Administered 2023-05-02: 50 mg via INTRAVENOUS
  Filled 2023-05-02: qty 1

## 2023-05-02 NOTE — ED Provider Notes (Signed)
  Physical Exam  BP (!) 141/74   Pulse 60   Temp 98.6 F (37 C) (Oral)   Resp 14   SpO2 96%   Physical Exam  Procedures  Procedures  ED Course / MDM    Medical Decision Making Amount and/or Complexity of Data Reviewed Labs: ordered. Radiology: ordered. ECG/medicine tests: ordered.  Risk Prescription drug management.   Melody Spurling, assumed care for this patient.  In brief this is 68 year old gentleman who was at his PCPs office for reevaluation after starting Wegovy .  While there, staff noticed that the patient appeared jaundiced and sent him to the ED for further evaluation.  Patient's labs resulted and showed a elevated direct bilirubin, elevated LFTs and alk phos.  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  Labs and imaging correlate well.  Spoke with Dr. Camilo Cella from general surgery believe patient would be able to receive appropriate care within our system.  Is requesting medicine admission.  Have placed a medicine admission request.       Nathanael Baker, DO 05/02/23 [id number]

## 2023-05-02 NOTE — Consult Note (Signed)
 CC/Reason for consult: Possible hilar mass/cholangiocarcinoma, obstructive jaundice  HPI: Robert [name] is an 68 y.o. male with hx of HTN, COPD, who presented to the emergency department 05/02/2023 as a referral from his PCP.  He was at his PCP office for reevaluation after initiating Wegovy  recently.  By his report, he was found to be jaundiced and was subsequently referred to the ER for evaluation.  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   COPD (chronic obstructive pulmonary disease) (HCC)    Elevated hemoglobin A1c    Elevated PSA 03/16/2016   Hypertension    Personal history of colonic adenomas 12/28/2007   Vitamin D  deficiency     Past Surgical History:  Procedure Laterality Date   COLONOSCOPY  2009   CG  hems, TAs   LYMPHADENECTOMY N/A 06/28/2016   Procedure: LYMPHADENECTOMY;  Surgeon: Florencio Hunting, MD;  Location: [facility];  Service: Urology;  Laterality: N/A;   PROSTATE BIOPSY  03/16/2016   Surgical Pathology Gross and Microscopic Examination for Prostate Needle   ROBOT ASSISTED LAPAROSCOPIC RADICAL PROSTATECTOMY N/A 06/28/2016   Procedure: XI ROBOTIC ASSISTED LAPAROSCOPIC RADICAL PROSTATECTOMY LEVEL 2 EXCISION OF PENILE LESION;  Surgeon: Florencio Hunting, MD;  Location: [facility];  Service: Urology;  Laterality: N/A;    Family History  Problem Relation Age of Onset   Cancer Mother        lung   Lung disease Neg Hx    Colon cancer Neg Hx    Rectal cancer Neg Hx    Stomach cancer Neg Hx     Social:  reports that he has been smoking cigarettes. He has a 20 pack-year smoking history. He has never used smokeless tobacco. He reports current alcohol use of about 4.0 standard drinks of alcohol per week. He reports that he does not use drugs.  Allergies:  Allergies  Allergen Reactions   Codeine Other (See Comments)    REACTION: Itching   Isovue  [Iopamidol ] Hives    Pt. Developed 1 hive after injection;  Dr. Gaines Joy spoke with patient;recommends 13 hr prep in the future    Medications: I have reviewed the patient's current medications.  Results for orders placed or performed during the hospital encounter of 05/02/23 (from the past 48 hours)  CBC     Status: Abnormal   Collection Time: 05/02/23  2:33 PM  Result Value Ref Range   WBC 10.0 4.0 - 10.5 K/uL   RBC 4.36 4.22 - 5.81 MIL/uL   Hemoglobin 14.8 13.0 - 17.0 g/dL   HCT 29.5 62.1 - 30.8 %   MCV 90.8 80.0 - 100.0 fL   MCH 33.9 26.0 - 34.0 pg   MCHC 37.4 (H) 30.0 - 36.0 g/dL   RDW 65.7 (H) 84.6 - 96.2 %   Platelets 190 150 - 400 K/uL   nRBC 0.0 0.0 - 0.2 %    Comment: Performed at [facility], [address], [place], [place] [address]  Comprehensive metabolic panel     Status: Abnormal   Collection Time: 05/02/23  2:33 PM  Result Value Ref Range   Sodium 131 (L) 135 - 145 mmol/L   Potassium 3.5 3.5 - 5.1 mmol/L   Chloride 94 (L) 98 - 111 mmol/L   CO2 26 22 - 32 mmol/L   Glucose, Bld 99 70 - 99 mg/dL    Comment: Glucose reference range applies only to samples taken after fasting  for at least 8 hours.   BUN 18 8 - 23 mg/dL   Creatinine, Ser 4.09 0.61 - 1.24 mg/dL   Calcium  9.7 8.9 - 10.3 mg/dL   Total Protein 6.8 6.5 - 8.1 g/dL   Albumin 3.8 3.5 - 5.0 g/dL   AST 811 (H) 15 - 41 U/L   ALT 210 (H) 0 - 44 U/L   Alkaline Phosphatase 348 (H) 38 - 126 U/L   Total Bilirubin 17.0 (H) 0.0 - 1.2 mg/dL   GFR, Estimated >91 >47 mL/min    Comment: (NOTE) Calculated using the CKD-EPI Creatinine Equation (2021)    Anion gap 11 5 - 15    Comment: Performed at [facility], [address], [place], [place] [address]  Lipase, blood     Status: None   Collection Time: 05/02/23  2:33 PM  Result Value Ref Range   Lipase 16 11 - 51 U/L    Comment: Performed at [facility], [address] [place], [place] [address]  Hepatitis panel, acute     Status: None (Preliminary result)    Collection Time: 05/02/23  2:33 PM  Result Value Ref Range   Hepatitis B Surface Ag NON REACTIVE NON REACTIVE    Comment: Performed at [facility], [address] [place], [place] [address]   HCV Ab PENDING NON REACTIVE   Hep A IgM PENDING NON REACTIVE   Hep B C IgM PENDING NON REACTIVE  Bilirubin, direct     Status: Abnormal   Collection Time: 05/02/23  3:42 PM  Result Value Ref Range   Bilirubin, Direct 9.6 (H) 0.0 - 0.2 mg/dL    Comment: Performed at [facility], [address] [place], [place] [address]    CT ABDOMEN PELVIS W CONTRAST Result Date: 05/02/2023 CLINICAL DATA:  Adnexal mass, malignancy suspected seen at PCP in referred to ED due to increased yelling of skin and severe nausea, vomiting, diarrhea. Recently stopped Wegovy . History of prostatectomy for prostate cancer. Allergy to Isovue . Patient given Benadryl  IV pre scan. EXAM: CT ABDOMEN AND PELVIS WITH CONTRAST TECHNIQUE: Multidetector CT imaging of the abdomen and pelvis was performed using the standard protocol following bolus administration of intravenous contrast. RADIATION DOSE REDUCTION: This exam was performed according to the departmental dose-optimization program which includes automated exposure control, adjustment of the mA and/or kV according to patient size and/or use of iterative reconstruction technique. CONTRAST:  OMNIPAQUE  IOHEXOL  300 MG/ML  SOLN COMPARISON:  03/30/2016 FINDINGS: Lower chest: No acute abnormality. Hepatobiliary: Since 2018 there is new moderate intrahepatic biliary dilation. Question enhancing mass within the porta hepatis about the proximal common bile duct (series 4/image 66) measuring 3.0 x 2.2 cm. There is mass effect on the common bile duct in this region with hyperenhancement of the wall of the common bile duct. Hyperdensity throughout the gallbladder. Hyperenhancement about the wall of the cystic duct. No focal hepatic lesion. Pancreas: Unremarkable. No  pancreatic ductal dilatation or surrounding inflammatory changes. Spleen: Unremarkable. Adrenals/Urinary Tract: Normal adrenal glands. Nonobstructing left nephrolithiasis. No urinary calculi or hydronephrosis. Unremarkable bladder. Stomach/Bowel: Normal caliber large and small bowel. No bowel wall thickening. Stomach is within normal limits. Normal appendix. Vascular/Lymphatic: Aortic atherosclerosis. 12 mm left periaortic node (2/21) is not substantially changed from 03/30/2016. Reproductive: Prostatectomy. Other: No free intraperitoneal fluid or air. Fat containing periumbilical hernia. There is fat stranding within the herniated fat. Musculoskeletal: No acute fracture or destructive osseous lesion. IMPRESSION: 1. Moderate intrahepatic biliary dilation. Question  enhancing mass within the porta hepatis about the proximal common bile duct. There is mass effect on the common bile duct in this region with hyperenhancement of the wall of the common bile duct. 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 Recommend further evaluation with MRI abdomen with and without contrast. 2. Hyperdensity throughout the gallbladder, likely sludge. 3. Fat containing periumbilical hernia. There is fat stranding within the herniated fat. Correlate for incarceration. 4. Nonobstructing left nephrolithiasis. 5. Aortic Atherosclerosis (ICD10-I70.0). Electronically Signed   By: Rozell Cornet M.D.   On: 05/02/2023 20:54    ROS - all of the below systems have been reviewed with the patient and positives are indicated with bold text General: chills, fever or night sweats Eyes: blurry vision or double vision ENT: epistaxis or sore throat Allergy/Immunology: itchy/watery eyes or nasal congestion Hematologic/Lymphatic: bleeding problems, blood clots or swollen lymph nodes Endocrine: temperature intolerance or unexpected weight changes Breast: new or changing breast lumps or nipple discharge Resp: cough, shortness of breath, or  wheezing CV: chest pain or dyspnea on exertion GI: as per HPI GU: dysuria, trouble voiding, or hematuria MSK: joint pain or joint stiffness Neuro: TIA or stroke symptoms Derm: pruritus and skin lesion changes Psych: anxiety and depression  PE Blood pressure (!) 141/74, pulse 60, temperature 98.6 F (37 C), temperature source Oral, resp. rate 14, SpO2 96%. Constitutional: NAD; conversant; no deformities Eyes: Moist conjunctiva; no lid lag; anicteric; PERRL Neck: Trachea midline; no thyromegaly Lungs: Normal respiratory effort; no tactile fremitus CV: RRR; no palpable thrills; no pitting edema GI: Abd ***; no palpable hepatosplenomegaly MSK: Normal range of motion of extremities; no clubbing/cyanosis Psychiatric: Appropriate affect; alert and oriented x3 Lymphatic: No palpable cervical or axillary lymphadenopathy  Results for orders placed or performed during the hospital encounter of 05/02/23 (from the past 48 hours)  CBC     Status: Abnormal   Collection Time: 05/02/23  2:33 PM  Result Value Ref Range   WBC 10.0 4.0 - 10.5 K/uL   RBC 4.36 4.22 - 5.81 MIL/uL   Hemoglobin 14.8 13.0 - 17.0 g/dL   HCT 16.1 09.6 - 04.5 %   MCV 90.8 80.0 - 100.0 fL   MCH 33.9 26.0 - 34.0 pg   MCHC 37.4 (H) 30.0 - 36.0 g/dL   RDW 40.9 (H) 81.1 - 91.4 %   Platelets 190 150 - 400 K/uL   nRBC 0.0 0.0 - 0.2 %    Comment: Performed at [facility], [address] [place], [place] [address]  Comprehensive metabolic panel     Status: Abnormal   Collection Time: 05/02/23  2:33 PM  Result Value Ref Range   Sodium 131 (L) 135 - 145 mmol/L   Potassium 3.5 3.5 - 5.1 mmol/L   Chloride 94 (L) 98 - 111 mmol/L   CO2 26 22 - 32 mmol/L   Glucose, Bld 99 70 - 99 mg/dL    Comment: Glucose reference range applies only to samples taken after fasting for at least 8 hours.   BUN 18 8 - 23 mg/dL   Creatinine, Ser 6.21 0.61 - 1.24 mg/dL   Calcium  9.7 8.9 - 10.3 mg/dL   Total Protein 6.8 6.5 - 8.1  g/dL   Albumin 3.8 3.5 - 5.0 g/dL   AST 308 (H) 15 - 41 U/L   ALT 210 (H) 0 - 44 U/L   Alkaline Phosphatase 348 (H) 38 - 126 U/L   Total Bilirubin 17.0 (H) 0.0 - 1.2 mg/dL   GFR,  Estimated >60 >60 mL/min    Comment: (NOTE) Calculated using the CKD-EPI Creatinine Equation (2021)    Anion gap 11 5 - 15    Comment: Performed at [facility], [address], [place], [place] [address]  Lipase, blood     Status: None   Collection Time: 05/02/23  2:33 PM  Result Value Ref Range   Lipase 16 11 - 51 U/L    Comment: Performed at [facility], [address], [place], [place] [address]  Hepatitis panel, acute     Status: None (Preliminary result)   Collection Time: 05/02/23  2:33 PM  Result Value Ref Range   Hepatitis B Surface Ag NON REACTIVE NON REACTIVE    Comment: Performed at [facility], [address], [place], [place] [address]   HCV Ab PENDING NON REACTIVE   Hep A IgM PENDING NON REACTIVE   Hep B C IgM PENDING NON REACTIVE  Bilirubin, direct     Status: Abnormal   Collection Time: 05/02/23  3:42 PM  Result Value Ref Range   Bilirubin, Direct 9.6 (H) 0.0 - 0.2 mg/dL    Comment: Performed at [facility], [address], [place], [place] [address]    CT ABDOMEN PELVIS W CONTRAST Result Date: 05/02/2023 CLINICAL DATA:  Adnexal mass, malignancy suspected seen at PCP in referred to ED due to increased yelling of skin and severe nausea, vomiting, diarrhea. Recently stopped Wegovy . History of prostatectomy for prostate cancer. Allergy to Isovue . Patient given Benadryl  IV pre scan. EXAM: CT ABDOMEN AND PELVIS WITH CONTRAST TECHNIQUE: Multidetector CT imaging of the abdomen and pelvis was performed using the standard protocol following bolus administration of intravenous contrast. RADIATION DOSE REDUCTION: This exam was performed according to the departmental dose-optimization program which includes automated exposure control,  adjustment of the mA and/or kV according to patient size and/or use of iterative reconstruction technique. CONTRAST:  OMNIPAQUE  IOHEXOL  300 MG/ML  SOLN COMPARISON:  03/30/2016 FINDINGS: Lower chest: No acute abnormality. Hepatobiliary: Since 2018 there is new moderate intrahepatic biliary dilation. Question enhancing mass within the porta hepatis about the proximal common bile duct (series 4/image 66) measuring 3.0 x 2.2 cm. There is mass effect on the common bile duct in this region with hyperenhancement of the wall of the common bile duct. Hyperdensity throughout the gallbladder. Hyperenhancement about the wall of the cystic duct. No focal hepatic lesion. Pancreas: Unremarkable. No pancreatic ductal dilatation or surrounding inflammatory changes. Spleen: Unremarkable. Adrenals/Urinary Tract: Normal adrenal glands. Nonobstructing left nephrolithiasis. No urinary calculi or hydronephrosis. Unremarkable bladder. Stomach/Bowel: Normal caliber large and small bowel. No bowel wall thickening. Stomach is within normal limits. Normal appendix. Vascular/Lymphatic: Aortic atherosclerosis. 12 mm left periaortic node (2/21) is not substantially changed from 03/30/2016. Reproductive: Prostatectomy. Other: No free intraperitoneal fluid or air. Fat containing periumbilical hernia. There is fat stranding within the herniated fat. Musculoskeletal: No acute fracture or destructive osseous lesion. IMPRESSION: 1. Moderate intrahepatic biliary dilation. Question enhancing mass within the porta hepatis about the proximal common bile duct. There is mass effect on the common bile duct in this region with hyperenhancement of the wall of the common bile duct. 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 Recommend further evaluation with MRI abdomen with and without contrast. 2. Hyperdensity throughout the gallbladder, likely sludge. 3. Fat containing periumbilical hernia. There is fat stranding within the herniated fat. Correlate  for incarceration. 4. Nonobstructing left nephrolithiasis. 5. Aortic Atherosclerosis (ICD10-I70.0). Electronically Signed   By: Rozell Cornet  M.D.   On: 05/02/2023 20:54    A/P: Aristeo Hankerson is an 68 y.o. male with HTN, COPD here with obstructive jaundice, possible enhancing mass in porta hepatis, concerning for possible cholagiocarcinoma  - Will need MRCP - GI consultation for possible EUS/ERCP - Trend LFTs  I spent a total of *** minutes in both face-to-face and non-face-to-face activities, excluding procedures performed, for this visit on the date of this encounter.  Beatris Lincoln, MD [facility], [facility]

## 2023-05-02 NOTE — Progress Notes (Signed)
   Patient Name: Robert [name], Robert [name] DOB: November 20, 1955 MRN: [id number] Transferring facility: [facility] Requesting provider: stevens, DO Reason for transfer: jaundice 69 yo WM PMHX DM2, HTN, presents to ER with abd pain, N/V. jaundiced. T. bili 17. CT abd shows intrahepatic biliary dilation. 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 EDP will tell patient about potential for cancer. EDP has consuled Dr. Camilo Cella with general surgery and Dr. Cherryl Corona with GI.  keep pt NPO for probably ERCP in AM. Going to: [facility] Admission Status: inpatient Bed Type: med/surg To Do: make sure general surgery and GI consult on patient. Keep NPO for ERCP in AM.  [facility] will assume care on arrival to accepting facility. Until arrival, medical decision making responsibilities remain with the EDP.  However, [facility] available 24/7 for questions and assistance.   Nursing staff please page [facility] Admits and Consults [phone]) as soon as the patient arrives to the hospital.  Unk Garb, DO [facility]

## 2023-05-02 NOTE — ED Notes (Signed)
 Pt stated "he did not have any hives, but had some sort of reaction one time when he got it". MD aware and orders placed. Pt given Benadryl  for possible contrast allergy prior to CT. CT to return shortly.

## 2023-05-02 NOTE — ED Triage Notes (Signed)
 Seen at PCP and referred here d/t increased yellowing of skin and severe n/v/d. Recently stopped Wegovy .

## 2023-05-03 ENCOUNTER — Encounter (HOSPITAL_COMMUNITY): Payer: Self-pay | Admitting: Family Medicine

## 2023-05-03 ENCOUNTER — Inpatient Hospital Stay (HOSPITAL_COMMUNITY)

## 2023-05-03 DIAGNOSIS — K529 Noninfective gastroenteritis and colitis, unspecified: Secondary | ICD-10-CM | POA: Diagnosis present

## 2023-05-03 DIAGNOSIS — E785 Hyperlipidemia, unspecified: Secondary | ICD-10-CM | POA: Diagnosis present

## 2023-05-03 DIAGNOSIS — E66812 Obesity, class 2: Secondary | ICD-10-CM | POA: Diagnosis present

## 2023-05-03 DIAGNOSIS — K838 Other specified diseases of biliary tract: Secondary | ICD-10-CM | POA: Diagnosis not present

## 2023-05-03 DIAGNOSIS — K2970 Gastritis, unspecified, without bleeding: Secondary | ICD-10-CM | POA: Diagnosis present

## 2023-05-03 DIAGNOSIS — R17 Unspecified jaundice: Secondary | ICD-10-CM | POA: Diagnosis present

## 2023-05-03 DIAGNOSIS — Z7951 Long term (current) use of inhaled steroids: Secondary | ICD-10-CM | POA: Diagnosis not present

## 2023-05-03 DIAGNOSIS — Z7985 Long-term (current) use of injectable non-insulin antidiabetic drugs: Secondary | ICD-10-CM | POA: Diagnosis not present

## 2023-05-03 DIAGNOSIS — Z79899 Other long term (current) drug therapy: Secondary | ICD-10-CM | POA: Diagnosis not present

## 2023-05-03 DIAGNOSIS — Z6832 Body mass index (BMI) 32.0-32.9, adult: Secondary | ICD-10-CM | POA: Diagnosis not present

## 2023-05-03 DIAGNOSIS — Z9079 Acquired absence of other genital organ(s): Secondary | ICD-10-CM | POA: Diagnosis not present

## 2023-05-03 DIAGNOSIS — R933 Abnormal findings on diagnostic imaging of other parts of digestive tract: Secondary | ICD-10-CM | POA: Diagnosis not present

## 2023-05-03 DIAGNOSIS — E876 Hypokalemia: Secondary | ICD-10-CM | POA: Diagnosis present

## 2023-05-03 DIAGNOSIS — K429 Umbilical hernia without obstruction or gangrene: Secondary | ICD-10-CM | POA: Diagnosis present

## 2023-05-03 DIAGNOSIS — L299 Pruritus, unspecified: Secondary | ICD-10-CM | POA: Diagnosis present

## 2023-05-03 DIAGNOSIS — Z7982 Long term (current) use of aspirin: Secondary | ICD-10-CM | POA: Diagnosis not present

## 2023-05-03 DIAGNOSIS — C240 Malignant neoplasm of extrahepatic bile duct: Secondary | ICD-10-CM | POA: Diagnosis present

## 2023-05-03 DIAGNOSIS — Z8546 Personal history of malignant neoplasm of prostate: Secondary | ICD-10-CM | POA: Diagnosis not present

## 2023-05-03 DIAGNOSIS — F17210 Nicotine dependence, cigarettes, uncomplicated: Secondary | ICD-10-CM | POA: Diagnosis present

## 2023-05-03 DIAGNOSIS — R162 Hepatomegaly with splenomegaly, not elsewhere classified: Secondary | ICD-10-CM | POA: Diagnosis present

## 2023-05-03 DIAGNOSIS — R109 Unspecified abdominal pain: Secondary | ICD-10-CM | POA: Diagnosis not present

## 2023-05-03 DIAGNOSIS — J449 Chronic obstructive pulmonary disease, unspecified: Secondary | ICD-10-CM | POA: Diagnosis present

## 2023-05-03 DIAGNOSIS — R110 Nausea: Secondary | ICD-10-CM | POA: Diagnosis present

## 2023-05-03 DIAGNOSIS — E871 Hypo-osmolality and hyponatremia: Secondary | ICD-10-CM | POA: Diagnosis present

## 2023-05-03 DIAGNOSIS — K2960 Other gastritis without bleeding: Secondary | ICD-10-CM | POA: Diagnosis not present

## 2023-05-03 DIAGNOSIS — E119 Type 2 diabetes mellitus without complications: Secondary | ICD-10-CM | POA: Diagnosis present

## 2023-05-03 DIAGNOSIS — I10 Essential (primary) hypertension: Secondary | ICD-10-CM | POA: Diagnosis present

## 2023-05-03 DIAGNOSIS — N320 Bladder-neck obstruction: Secondary | ICD-10-CM | POA: Diagnosis present

## 2023-05-03 DIAGNOSIS — K831 Obstruction of bile duct: Secondary | ICD-10-CM | POA: Diagnosis present

## 2023-05-03 LAB — COMPREHENSIVE METABOLIC PANEL WITH GFR
ALT: 204 U/L — ABNORMAL HIGH (ref 0–44)
AST: 206 U/L — ABNORMAL HIGH (ref 15–41)
Albumin: 2.7 g/dL — ABNORMAL LOW (ref 3.5–5.0)
Alkaline Phosphatase: 301 U/L — ABNORMAL HIGH (ref 38–126)
Anion gap: 11 (ref 5–15)
BUN: 10 mg/dL (ref 8–23)
CO2: 25 mmol/L (ref 22–32)
Calcium: 9.1 mg/dL (ref 8.9–10.3)
Chloride: 96 mmol/L — ABNORMAL LOW (ref 98–111)
Creatinine, Ser: 0.76 mg/dL (ref 0.61–1.24)
GFR, Estimated: >60 mL/min (ref >60)
Glucose, Bld: 109 mg/dL — ABNORMAL HIGH (ref 70–99)
Potassium: 3.4 mmol/L — ABNORMAL LOW (ref 3.5–5.1)
Sodium: 132 mmol/L — ABNORMAL LOW (ref 135–145)
Total Bilirubin: 17.2 mg/dL — ABNORMAL HIGH (ref 0.0–1.2)
Total Protein: 6.3 g/dL — ABNORMAL LOW (ref 6.5–8.1)

## 2023-05-03 LAB — CBC
HCT: 39.5 % (ref 39.0–52.0)
Hemoglobin: 14.1 g/dL (ref 13.0–17.0)
MCH: 32.8 pg (ref 26.0–34.0)
MCHC: 35.7 g/dL (ref 30.0–36.0)
MCV: 91.9 fL (ref 80.0–100.0)
Platelets: 208 10*3/uL (ref 150–400)
RBC: 4.3 MIL/uL (ref 4.22–5.81)
RDW: 15.9 % — ABNORMAL HIGH (ref 11.5–15.5)
WBC: 9.5 10*3/uL (ref 4.0–10.5)
nRBC: 0 % (ref 0.0–0.2)

## 2023-05-03 LAB — PROTIME-INR
INR: 1 (ref 0.8–1.2)
Prothrombin Time: 13.6 s (ref 11.4–15.2)

## 2023-05-03 LAB — HIV ANTIBODY (ROUTINE TESTING W REFLEX): HIV Screen 4th Generation wRfx: NONREACTIVE

## 2023-05-03 LAB — MAGNESIUM: Magnesium: 1.8 mg/dL (ref 1.7–2.4)

## 2023-05-03 MED ORDER — BUDESON-GLYCOPYRROL-FORMOTEROL 160-9-4.8 MCG/ACT IN AERO
2.0000 | INHALATION_SPRAY | Freq: Two times a day (BID) | RESPIRATORY_TRACT | Status: DC
  Administered 2023-05-03 – 2023-05-07 (×9): 2 via RESPIRATORY_TRACT
  Filled 2023-05-03: qty 5.9

## 2023-05-03 MED ORDER — HYDRALAZINE HCL 25 MG PO TABS: 25.0000 mg | ORAL_TABLET | Freq: Four times a day (QID) | ORAL | Status: DC | PRN

## 2023-05-03 MED ORDER — OXYCODONE HCL 5 MG PO TABS: 5.0000 mg | ORAL_TABLET | ORAL | Status: DC | PRN

## 2023-05-03 MED ORDER — LACTATED RINGERS IV SOLN: INTRAVENOUS | Status: AC

## 2023-05-03 MED ORDER — FLUTICASONE FUROATE-VILANTEROL 200-25 MCG/ACT IN AEPB: 1.0000 | INHALATION_SPRAY | Freq: Every day | RESPIRATORY_TRACT | Status: DC

## 2023-05-03 MED ORDER — ALBUTEROL SULFATE (2.5 MG/3ML) 0.083% IN NEBU: 2.5000 mg | INHALATION_SOLUTION | Freq: Four times a day (QID) | RESPIRATORY_TRACT | Status: DC | PRN

## 2023-05-03 MED ORDER — PNEUMOCOCCAL 20-VAL CONJ VACC 0.5 ML IM SUSY
0.5000 mL | PREFILLED_SYRINGE | INTRAMUSCULAR | Status: DC
  Filled 2023-05-03: qty 0.5

## 2023-05-03 MED ORDER — BENAZEPRIL-HYDROCHLOROTHIAZIDE 20-25 MG PO TABS: 1.0000 | ORAL_TABLET | Freq: Every day | ORAL | Status: DC

## 2023-05-03 MED ORDER — PROCHLORPERAZINE EDISYLATE 10 MG/2ML IJ SOLN: 5.0000 mg | INTRAMUSCULAR | Status: DC | PRN

## 2023-05-03 MED ORDER — POTASSIUM CHLORIDE CRYS ER 20 MEQ PO TBCR
40.0000 meq | EXTENDED_RELEASE_TABLET | Freq: Once | ORAL | Status: AC
  Administered 2023-05-03: 40 meq via ORAL
  Filled 2023-05-03: qty 2

## 2023-05-03 MED ORDER — BENAZEPRIL HCL 20 MG PO TABS
20.0000 mg | ORAL_TABLET | Freq: Every day | ORAL | Status: DC
  Administered 2023-05-03 – 2023-05-06 (×4): 20 mg via ORAL
  Filled 2023-05-03 (×5): qty 1

## 2023-05-03 MED ORDER — NICOTINE 21 MG/24HR TD PT24
21.0000 mg | MEDICATED_PATCH | Freq: Every day | TRANSDERMAL | Status: DC
  Filled 2023-05-03 (×5): qty 1

## 2023-05-03 MED ORDER — HYDROCHLOROTHIAZIDE 25 MG PO TABS
25.0000 mg | ORAL_TABLET | Freq: Every day | ORAL | Status: DC
  Administered 2023-05-03: 25 mg via ORAL
  Filled 2023-05-03: qty 1

## 2023-05-03 MED ORDER — GADOBUTROL 1 MMOL/ML IV SOLN
10.0000 mL | Freq: Once | INTRAVENOUS | Status: AC | PRN
  Administered 2023-05-03: 10 mL via INTRAVENOUS

## 2023-05-03 MED ORDER — FENTANYL CITRATE PF 50 MCG/ML IJ SOSY: 12.5000 ug | PREFILLED_SYRINGE | INTRAMUSCULAR | Status: DC | PRN

## 2023-05-03 MED ORDER — ACETAMINOPHEN 325 MG PO TABS: 325.0000 mg | ORAL_TABLET | Freq: Three times a day (TID) | ORAL | Status: DC | PRN

## 2023-05-03 NOTE — Consult Note (Addendum)
 Consultation  Referring Provider: [facility]/Yates Primary Care Physician:  [facility] Primary Gastroenterologist:  Dr.Gessner/remote  Reason for Consultation: Jaundice, abdominal pain, abnormal CT imaging concerning for cholangiocarcinoma.   Attending physician's note  I personally saw the patient and performed a substantive portion of this encounter (>50% time spent), including a complete performance of at least one of the key components (MDM, Hx and/or Exam), in conjunction with the APP.  I agree with the APP's note, impression, and recommendations with additional input as follows.    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  No distant metastatic disease  Tentatively scheduled for ERCP with Dr. Brice Campi on Friday Continue diet as tolerated Pain control   The patient was provided an opportunity to ask questions and all were answered. The patient agreed with the plan and demonstrated an understanding of the instructions.  Lorena Rolling , MD [phone]     HPI: Robert [name] is a 69 y.o. male with history of COPD, no O2 use, hypertension, hyperlipidemia, and history of adenomatous colon polyps with last colonoscopy 2009/Gessner. Patient says that he has not been feeling well over the past couple of weeks, has had nausea and a decrease in his appetite.  He had been taking Wegovy  for several months and stopped it when he started having the other GI symptoms, so has been off over the past couple of weeks.  He really has not had any abdominal pain but says late last week he had increase in symptoms with nausea vomiting and diarrhea, no fever that he is aware of.  Because of the symptoms he had an appointment with his PCP yesterday, was seen and noted to be deeply  jaundiced and referred to the ER for admission and further workup.  He says the nausea and vomiting and diarrhea have resolved at this point so he may in fact have had a gastroenteritis accounting for those symptoms. Workup in the ER yesterday with CT of the abdomen pelvis shows new moderate intrahepatic biliary ductal dilation, and a mass in the porta hepatis measuring 3.0 x 2.2 cm with mass effect on the common bile duct and hyperenhancement of the wall of the common bile duct.  Pancreas appears unremarkable.  There is a 12 mm left periaortic node which appears unchanged since 2018, and a periumbilical hernia.  MRI/MRCP has been done, report pending.  Labs on admission WBC of 10/hemoglobin 14.6/hematocrit 39.6/platelets 190 Sodium 131/potassium 3.5/BUN 18/creatinine 0.87 T. bili 17/alk phos 398/AST 190/ALT 210 INR 1.0/pro time 13.6.  Patient is hungry today asking for food, he does endorse itching and dark urine which has come on gradually over the past couple of weeks.  Has had some weight loss but this is also been in the setting of the acute probable gastroenteritis, and Wegovy ..     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   COPD (chronic obstructive pulmonary disease) (HCC)    Elevated hemoglobin A1c    Elevated PSA 03/16/2016   Hypertension    Personal history of colonic adenomas 12/28/2007   Vitamin D  deficiency     Past Surgical History:  Procedure Laterality Date   COLONOSCOPY  2009   CG  hems, TAs   LYMPHADENECTOMY N/A 06/28/2016   Procedure: LYMPHADENECTOMY;  Surgeon: Florencio Hunting, MD;  Location: [facility];  Service: Urology;  Laterality: N/A;   PROSTATE BIOPSY  03/16/2016   Surgical Pathology Gross and Microscopic Examination for Prostate Needle   ROBOT ASSISTED LAPAROSCOPIC RADICAL PROSTATECTOMY N/A 06/28/2016   Procedure: XI ROBOTIC ASSISTED LAPAROSCOPIC RADICAL PROSTATECTOMY LEVEL 2 EXCISION OF PENILE  LESION;  Surgeon: Florencio Hunting, MD;  Location: [facility];  Service: Urology;  Laterality: N/A;    Prior to Admission medications   Medication Sig Start Date End Date Taking? Authorizing Provider  albuterol  (VENTOLIN  HFA) 108 (90 Base) MCG/ACT inhaler Inhale 2 puffs into the lungs every 6 (six) hours as needed for wheezing or shortness of breath. 02/17/22  Yes Sira, Zackery, MD  aspirin  EC 81 MG tablet Take 81 mg by mouth daily.   Yes [provider]  atorvastatin  (LIPITOR) 20 MG tablet TAKE 1 TABLET BY MOUTH DAILY FOR CHOLESTEROL 06/27/22  Yes Wilkinson, Dana E, FNP  benazepril -hydrochlorthiazide (LOTENSIN  HCT) 20-25 MG tablet Take 1 tablet Daily for BP                                               /                                                                   TAKE                                         BY                                                 MOUTH 12/25/22  Yes Vangie Genet, MD  BREZTRI  AEROSPHERE 160-9-4.8 MCG/ACT AERO inhaler Inhale 2 puffs into the lungs 2 (two) times daily. 04/22/23  Yes [provider]  Cholecalciferol (VITAMIN D ) 125 MCG (5000 UT) CAPS Take 5,000 Units by mouth daily.   Yes [provider]  Magnesium  500 MG TABS Take 500 mg by mouth daily.   Yes [provider]  Semaglutide -Weight Management (WEGOVY ) 1.7 MG/0.75ML SOAJ Inject 1.7 mg into the skin once a week. 03/14/23  Yes Webb, Padonda B, FNP  budesonide -formoterol  (SYMBICORT ) 160-4.5 MCG/ACT inhaler Use  2 inhalations  30 minutes apart  Once Daily  for COPD Patient not taking: Reported on 01/18/2023 01/09/23   Vangie Genet, MD    Current Facility-Administered Medications  Medication Dose Route Frequency Provider Last Rate Last Admin   acetaminophen  (TYLENOL ) tablet 325 mg  325 mg Oral Q8H PRN Opyd, Timothy S, MD       albuterol  (PROVENTIL ) (2.5 MG/3ML) 0.083% nebulizer solution 2.5 mg  2.5 mg Nebulization Q6H PRN Opyd, Timothy S, MD        budeson-glycopyrrolate -formoterol  (BREZTRI ) 160-9-4.8 MCG/ACT inhaler 2 puff  2 puff Inhalation BID Opyd, Santana Cue, MD   2 puff at 05/03/23 0804   fentaNYL  (SUBLIMAZE ) injection 12.5-50 mcg  12.5-50 mcg Intravenous Q2H  PRN Opyd, Timothy S, MD       hydrALAZINE  (APRESOLINE ) tablet 25 mg  25 mg Oral Q6H PRN Opyd, Timothy S, MD       lactated ringers  infusion   Intravenous Continuous Opyd, Timothy S, MD 75 mL/hr at 05/03/23 0605 New Bag at 05/03/23 0605   oxyCODONE  (Oxy IR/ROXICODONE ) immediate release tablet 5 mg  5 mg Oral Q4H PRN Opyd, Timothy S, MD       potassium chloride  SA (KLOR-CON  M) CR tablet 40 mEq  40 mEq Oral Once Lorita Rosa, MD       prochlorperazine  (COMPAZINE ) injection 5 mg  5 mg Intravenous Q4H PRN Opyd, Santana Cue, MD        Allergies as of 05/02/2023 - Review Complete 05/02/2023  Allergen Reaction Noted   Codeine Other (See Comments) 12/12/2007   Isovue  [iopamidol ] Hives 03/30/2016    Family History  Problem Relation Age of Onset   Cancer Mother        lung   Lung disease Neg Hx    Colon cancer Neg Hx    Rectal cancer Neg Hx    Stomach cancer Neg Hx     Social History   Socioeconomic History   Marital status: Widowed    Spouse name: Not on file   Number of children: Not on file   Years of education: Not on file   Highest education level: Not on file  Occupational History   Not on file  Tobacco Use   Smoking status: Every Day    Current packs/day: 0.50    Average packs/day: 0.5 packs/day for 40.0 years (20.0 ttl pk-yrs)    Types: Cigarettes   Smokeless tobacco: Never   Tobacco comments:    has decreased smoking significantly with goal of quitting  Vaping Use   Vaping status: Never Used  Substance and Sexual Activity   Alcohol use: Yes    Alcohol/week: 4.0 standard drinks of alcohol    Types: 4 Cans of beer per week    Comment: occasionally   Drug use: No   Sexual activity: Yes  Other Topics Concern   Not on file  Social History Narrative    [facility] (06/29/16):   Currently works as a Chartered certified accountant. Exposure primarily to aluminum and steel. No history of bird or mold exposure. No recent hot tub exposure. Does have 2 dogs.   Social Drivers of Corporate investment banker Strain: Not on file  Food Insecurity: No Food Insecurity (05/03/2023)   Hunger Vital Sign    Worried About Running Out of Food in the Last Year: Never true    Ran Out of Food in the Last Year: Never true  Transportation Needs: No Transportation Needs (05/03/2023)   PRAPARE - Administrator, Civil Service (Medical): No    Lack of Transportation (Non-Medical): No  Physical Activity: Not on file  Stress: Not on file  Social Connections: Unknown (05/03/2023)   Social Connection and Isolation Panel [NHANES]    Frequency of Communication with Friends and Family: More than three times a week    Frequency of Social Gatherings with Friends and Family: Twice a week    Attends Religious Services: Never    Database administrator or Organizations: No    Attends Banker Meetings: Never    Marital Status: Not on file  Intimate Partner Violence: Not At Risk (05/03/2023)   Humiliation, Afraid, Rape, and Kick questionnaire    Fear of Current  or Ex-Partner: No    Emotionally Abused: No    Physically Abused: No    Sexually Abused: No    Review of Systems: Pertinent positive and negative review of systems were noted in the above HPI section.  All other review of systems was otherwise negative.   Physical Exam: Vital signs in last 24 hours: Temp:  [97.9 F (36.6 C)-98.6 F (37 C)] 98.4 F (36.9 C) (04/22 0743) Pulse Rate:  [58-68] 58 (04/22 0804) Resp:  [14-22] 18 (04/22 0804) BP: (108-155)/(60-76) 131/63 (04/22 0743) SpO2:  [94 %-99 %] 94 % (04/22 0804) Last BM Date : 05/02/23 General:   Alert,  Well-developed, well-nourished, jaundiced older male pleasant and cooperative in NAD, wife at bedside Head:  Normocephalic and atraumatic. Eyes:   Sclera icteric Ears:  Normal auditory acuity. Nose:  No deformity, discharge,  or lesions. Mouth:  No deformity or lesions.   Neck:  Supple; no masses or thyromegaly. Lungs:  Clear throughout to auscultation.   No wheezes, crackles, or rhonchi.  Heart:  Regular rate and rhythm; no murmurs, clicks, rubs,  or gallops. Abdomen:  Soft,nontender, BS active,nonpalp mass or hsm.   Rectal: Done today Msk:  Symmetrical without gross deformities. . Pulses:  Normal pulses noted. Extremities:  Without clubbing or edema. Neurologic:  Alert and  oriented x4;  grossly normal neurologically. Skin:  Intact without significant lesions or rashes.. Psych:  Alert and cooperative. Normal mood and affect.  Intake/Output from previous day: No intake/output data recorded. Intake/Output this shift: No intake/output data recorded.  Lab Results: Recent Labs    05/02/23 1433 05/03/23 0528  WBC 10.0 9.5  HGB 14.8 14.1  HCT 39.6 39.5  PLT 190 208   BMET Recent Labs    05/02/23 1433 05/03/23 0528  NA 131* 132*  K 3.5 3.4*  CL 94* 96*  CO2 26 25  GLUCOSE 99 109*  BUN 18 10  CREATININE 0.87 0.76  CALCIUM  9.7 9.1   LFT Recent Labs    05/02/23 1542 05/03/23 0528  PROT  --  6.3*  ALBUMIN  --  2.7*  AST  --  206*  ALT  --  204*  ALKPHOS  --  301*  BILITOT  --  17.2*  BILIDIR 9.6*  --    PT/INR Recent Labs    05/03/23 0528  LABPROT 13.6  INR 1.0   Hepatitis Panel Recent Labs    05/02/23 1433  HEPBSAG NON REACTIVE  HCVAB NON REACTIVE  HEPAIGM NON REACTIVE  HEPBIGM NON REACTIVE     IMPRESSION:  #51 68 year old male with 2-week history of feeling poorly with decreased appetite and some nausea.  He had noticed darkening of his urine and onset of pruritus but was unaware that he was jaundiced until he saw his PCP yesterday.  He did have an acute illness in the midst of this with nausea and vomiting and diarrhea which lasted for a few days and has since resolved.  Likely  viral  Workup since admission with CT imaging and labs showing T. bili of 17 and CT concerning for a cholangiocarcinoma with a mass at the porta hepatis.  MRI/MRCP is pending   #2 obesity-had been on Wegovy , now stopped #3 hypertension #4 hyperlipidemia  Plan; okay for regular diet today Will discuss with our biliary team regarding potential ERCP and stent placements with brushings.  Timing to be determined. I discussed ERCP, brushings and stent placements in detail with the patient and his wife at bedside, including  indications risks and benefits and we reviewed biliary images.  He is agreeable to procedures. CEA and CA 19-9 today  Further recommendations later today, will follow with you      Amy EsterwoodPA-C  05/03/2023, 10:10 AM

## 2023-05-03 NOTE — H&P (Signed)
 History and Physical    Robert [name] ZOX:[id number] DOB: 18-Feb-1955 DOA: 05/02/2023  PCP: [facility]   Patient coming from: Home   Chief Complaint: Jaundice, N/V/D   HPI: Robert [name] is a 68 y.o. male with medical history significant for COPD, hypertension, and prostate cancer status post radical prostatectomy in 2018 who presents for evaluation of jaundice, nausea, vomiting, and loose stools.  Patient had been in his usual state until approximately 1 to 2 weeks ago when he developed nausea, vomiting, and loose stools.  The vomiting resolved after 1 to 2 days and the diarrhea resolved later.  He denies any abdominal pain.  He developed pruritus and dark urine over the past 1 to 2 weeks.  He reports that his COPD has been well-controlled.  [facility] ED Course: Upon arrival to the ED, patient is found to be afebrile and saturating well on room air with normal HR and stable BP.  Labs are most notable for alkaline phosphatase 348, AST 190, ALT 210, total bilirubin 17, normal renal function, normal lipase, and normal WBC.  CT demonstrates moderate intrahepatic biliary dilation with question of enhancing mass within the porta hepatis about the proximal CBD which exerts mass effect on the CBD and is concerning for cholangiocarcinoma.   Surgery and GI were consulted by the ED physician and the patient was transferred to [facility] for admission.  Review of Systems:  All other systems reviewed and apart from HPI, are negative.  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   COPD (chronic obstructive pulmonary disease) (HCC)    Elevated hemoglobin A1c    Elevated PSA 03/16/2016   Hypertension    Personal history of colonic adenomas 12/28/2007   Vitamin D  deficiency     Past Surgical History:  Procedure Laterality Date   COLONOSCOPY  2009   CG  hems, TAs   LYMPHADENECTOMY N/A 06/28/2016   Procedure:  LYMPHADENECTOMY;  Surgeon: Florencio Hunting, MD;  Location: [facility];  Service: Urology;  Laterality: N/A;   PROSTATE BIOPSY  03/16/2016   Surgical Pathology Gross and Microscopic Examination for Prostate Needle   ROBOT ASSISTED LAPAROSCOPIC RADICAL PROSTATECTOMY N/A 06/28/2016   Procedure: XI ROBOTIC ASSISTED LAPAROSCOPIC RADICAL PROSTATECTOMY LEVEL 2 EXCISION OF PENILE LESION;  Surgeon: Florencio Hunting, MD;  Location: [facility];  Service: Urology;  Laterality: N/A;    Social History:   reports that he has been smoking cigarettes. He has a 20 pack-year smoking history. He has never used smokeless tobacco. He reports current alcohol use of about 4.0 standard drinks of alcohol per week. He reports that he does not use drugs.  Allergies  Allergen Reactions   Codeine Other (See Comments)    REACTION: Itching   Isovue  [Iopamidol ] Hives    Pt. Developed 1 hive after injection; Dr. Gaines Joy spoke with patient;recommends 13 hr prep in the future    Family History  Problem Relation Age of Onset   Cancer Mother        lung   Lung disease Neg Hx    Colon cancer Neg Hx    Rectal cancer Neg Hx    Stomach cancer Neg Hx      Prior to Admission medications   Medication Sig Start Date End Date Taking? Authorizing Provider  albuterol  (VENTOLIN  HFA) 108 (90 Base) MCG/ACT inhaler Inhale 2 puffs into the lungs every 6 (six) hours as needed for wheezing or shortness of  breath. 02/17/22   Sira, Zackery, MD  aspirin  EC 81 MG tablet Take 81 mg by mouth daily.    [provider]  atorvastatin  (LIPITOR) 20 MG tablet TAKE 1 TABLET BY MOUTH DAILY FOR CHOLESTEROL 06/27/22   Wilkinson, Dana E, FNP  benazepril -hydrochlorthiazide (LOTENSIN  HCT) 20-25 MG tablet Take 1 tablet Daily for BP                                               /                                                                   TAKE                                         BY                                                 MOUTH 12/25/22   Vangie Genet, MD  budesonide -formoterol  (SYMBICORT ) 160-4.5 MCG/ACT inhaler Use  2 inhalations  30 minutes apart  Once Daily  for COPD Patient not taking: Reported on 01/18/2023 01/09/23   Vangie Genet, MD  Cholecalciferol (VITAMIN D ) 125 MCG (5000 UT) CAPS Take 5,000 Units by mouth daily.    [provider]  Magnesium  500 MG TABS Take 500 mg by mouth daily.    [provider]  Semaglutide -Weight Management (WEGOVY ) 1.7 MG/0.75ML SOAJ Inject 1.7 mg into the skin once a week. 03/14/23   Webb, Padonda B, FNP  varenicline  (CHANTIX  CONTINUING MONTH PAK) 1 MG tablet Take  1 tablet 2 x / day for Smoking Cessation Patient not taking: Reported on 01/18/2023 04/13/22   Vangie Genet, MD    Physical Exam: Vitals:   05/02/23 2330 05/03/23 0052 05/03/23 0100 05/03/23 0200  BP: 134/71 136/66 (!) 140/67 125/76  Pulse: 68 61 61 63  Resp: (!) 22 20  18   Temp:  98 F (36.7 C)  98.4 F (36.9 C)  TempSrc:    Oral  SpO2: 96% 96% 96% 96%    Constitutional: NAD, calm  Eyes: PERTLA, lids normal ENMT: Mucous membranes are moist. Posterior pharynx clear of any exudate or lesions.   Neck: supple, no masses  Respiratory: no wheezing, no crackles. No accessory muscle use.  Cardiovascular: S1 & S2 heard, regular rate and rhythm. No extremity edema.   Abdomen: No distension, no tenderness, soft. Bowel sounds active.  Musculoskeletal: no clubbing / cyanosis. No joint deformity upper and lower extremities.   Skin: Jaundice. Warm, dry, well-perfused. Neurologic: CN 2-12 grossly intact. Moving all extremities. Alert and oriented.  Psychiatric: Pleasant. Cooperative.    Labs and Imaging on Admission: I have personally reviewed following labs and imaging studies  CBC: Recent Labs  Lab 05/02/23 1433  WBC 10.0  HGB 14.8  HCT 39.6  MCV 90.8  PLT 190   Basic Metabolic Panel: Recent  Labs  Lab 05/02/23 1433  NA 131*  K 3.5  CL 94*  CO2 26  GLUCOSE 99  BUN 18  CREATININE 0.87  CALCIUM  9.7    GFR: CrCl cannot be calculated (Unknown ideal weight.). Liver Function Tests: Recent Labs  Lab 05/02/23 1433  AST 190*  ALT 210*  ALKPHOS 348*  BILITOT 17.0*  PROT 6.8  ALBUMIN 3.8   Recent Labs  Lab 05/02/23 1433  LIPASE 16   No results for input(s): "AMMONIA" in the last 168 hours. Coagulation Profile: No results for input(s): "INR", "PROTIME" in the last 168 hours. Cardiac Enzymes: No results for input(s): "CKTOTAL", "CKMB", "CKMBINDEX", "TROPONINI" in the last 168 hours. BNP (last 3 results) No results for input(s): "PROBNP" in the last 8760 hours. HbA1C: No results for input(s): "HGBA1C" in the last 72 hours. CBG: No results for input(s): "GLUCAP" in the last 168 hours. Lipid Profile: No results for input(s): "CHOL", "HDL", "LDLCALC", "TRIG", "CHOLHDL", "LDLDIRECT" in the last 72 hours. Thyroid  Function Tests: No results for input(s): "TSH", "T4TOTAL", "FREET4", "T3FREE", "THYROIDAB" in the last 72 hours. Anemia Panel: No results for input(s): "VITAMINB12", "FOLATE", "FERRITIN", "TIBC", "IRON", "RETICCTPCT" in the last 72 hours. Urine analysis:    Component Value Date/Time   COLORURINE YELLOW 04/06/2022 1004   APPEARANCEUR CLEAR 04/06/2022 1004   LABSPEC 1.009 04/06/2022 1004   PHURINE 6.5 04/06/2022 1004   GLUCOSEU NEGATIVE 04/06/2022 1004   HGBUR NEGATIVE 04/06/2022 1004   BILIRUBINUR NEGATIVE 01/30/2018 0630   KETONESUR NEGATIVE 04/06/2022 1004   PROTEINUR TRACE (A) 04/06/2022 1004   NITRITE NEGATIVE 04/06/2022 1004   LEUKOCYTESUR NEGATIVE 04/06/2022 1004   Sepsis Labs: @LABRCNTIP (procalcitonin:4,lacticidven:4) )No results found for this or any previous visit (from the past 240 hours).   Radiological Exams on Admission: CT ABDOMEN PELVIS W CONTRAST Result Date: 05/02/2023 CLINICAL DATA:  Adnexal mass, malignancy suspected seen at PCP in referred to ED due to increased yelling of skin and severe nausea, vomiting, diarrhea. Recently stopped Wegovy .  History of prostatectomy for prostate cancer. Allergy to Isovue . Patient given Benadryl  IV pre scan. EXAM: CT ABDOMEN AND PELVIS WITH CONTRAST TECHNIQUE: Multidetector CT imaging of the abdomen and pelvis was performed using the standard protocol following bolus administration of intravenous contrast. RADIATION DOSE REDUCTION: This exam was performed according to the departmental dose-optimization program which includes automated exposure control, adjustment of the mA and/or kV according to patient size and/or use of iterative reconstruction technique. CONTRAST:  OMNIPAQUE  IOHEXOL  300 MG/ML  SOLN COMPARISON:  03/30/2016 FINDINGS: Lower chest: No acute abnormality. Hepatobiliary: Since 2018 there is new moderate intrahepatic biliary dilation. Question enhancing mass within the porta hepatis about the proximal common bile duct (series 4/image 66) measuring 3.0 x 2.2 cm. There is mass effect on the common bile duct in this region with hyperenhancement of the wall of the common bile duct. Hyperdensity throughout the gallbladder. Hyperenhancement about the wall of the cystic duct. No focal hepatic lesion. Pancreas: Unremarkable. No pancreatic ductal dilatation or surrounding inflammatory changes. Spleen: Unremarkable. Adrenals/Urinary Tract: Normal adrenal glands. Nonobstructing left nephrolithiasis. No urinary calculi or hydronephrosis. Unremarkable bladder. Stomach/Bowel: Normal caliber large and small bowel. No bowel wall thickening. Stomach is within normal limits. Normal appendix. Vascular/Lymphatic: Aortic atherosclerosis. 12 mm left periaortic node (2/21) is not substantially changed from 03/30/2016. Reproductive: Prostatectomy. Other: No free intraperitoneal fluid or air. Fat containing periumbilical hernia. There is fat stranding within the herniated fat. Musculoskeletal: No acute fracture or destructive osseous lesion. IMPRESSION: 1. Moderate intrahepatic biliary  dilation. Question enhancing mass within  the porta hepatis about the proximal common bile duct. There is mass effect on the common bile duct in this region with hyperenhancement of the wall of the common bile duct. 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 Recommend further evaluation with MRI abdomen with and without contrast. 2. Hyperdensity throughout the gallbladder, likely sludge. 3. Fat containing periumbilical hernia. There is fat stranding within the herniated fat. Correlate for incarceration. 4. Nonobstructing left nephrolithiasis. 5. Aortic Atherosclerosis (ICD10-I70.0). Electronically Signed   By: Rozell Cornet M.D.   On: 05/02/2023 20:54    EKG: Independently reviewed. Sinus rhythm, incomplete RBBB, LAFB.   Assessment/Plan   1. Obstructive jaundice  - CT findings concerning for cholangiocarcinoma  - Surgery and GI consulted by ED  - Continue bowel rest, check MRCP, trend labs, supportive care, follow-up on specialists' recommendations    2. COPD  - Not in exacerbation  - Continue ICS-LABA and as-needed albuterol     3. Hypertension  - Hold HCTZ and treat as-needed only for now    DVT prophylaxis: SCDs  Code Status: Full  Level of Care: Level of care: Med-Surg Family Communication: none present  Disposition Plan:  Patient is from: Home  Anticipated d/c is to: TBD Anticipated d/c date is: TBD Patient currently: Pending MRCP, specialist consultation  Consults called: GI, surgery  Admission status: Inpatient    Walton Guppy, MD [facility]  05/03/2023, 3:04 AM

## 2023-05-03 NOTE — Progress Notes (Signed)
 Progress Note   Patient: Robert [name] ZOX:[id number] DOB: [birth year] DOA: 05/02/2023     0 DOS: the patient was seen and examined on 05/03/2023   Brief hospital course: 68yo with h/o COPD, HTN, and prostate CA s/p radical prostatectomy (2018) who presented on 4/21 with painless jaundice and n/v/d.  Elevated LFTs.  CT with moderate intrahepatic biliary dilation, ?mass within the porta hepatis around the proximal CBD concerning for cholangiocarcinoma.  MRCP completed, surgery and GI consulted.  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   Patch ordered  Hypertension  Resume benazepril -hydrochlorothiazide  Hold ASA  HLD Continue atorvastatin   Class 2 obesity Hold Wegovy  Body mass index is 32.56 kg/m.[name]  Weight loss should be encouraged Outpatient PCP/bariatric medicine f/u encouraged Significantly low or high BMI is associated with higher medical risk including morbidity and mortality     Consultants: Surgery GI  Procedures: ERCP 4/23  Antibiotics: None  30 Day Unplanned Readmission Risk Score    Flowsheet Row ED to Hosp-Admission (Current) from 05/02/2023 in [facility]  30 Day Unplanned Readmission Risk Score (%) 11.77 Filed at 05/03/2023 0401       This score is the patient's risk of an unplanned readmission within 30 days of being discharged (0 -100%). The score is based on dignosis, age, lab data, medications, orders, and past utilization.   Low:  0-14.9   Medium: 15-21.9   High: 22-29.9   Extreme: 30 and above           Subjective: Asymptomatic, still very  jaundiced.   Objective: Vitals:   05/03/23 0804 05/03/23 1230  BP:  131/66  Pulse: (!) 58 65  Resp: 18 18  Temp:  98.7 F (37.1 C)  SpO2: 94% 99%   No intake or output data in the 24 hours ending 05/03/23 1551 Filed Weights   05/03/23 1030  Weight: 94.3 kg    Exam:  General:  Appears calm and comfortable and is in NAD; very jaundiced; smells of tobacco Eyes:   EOMI, normal lids, iris; marked scleral icterus ENT:  grossly normal hearing, lips & tongue, mmm Cardiovascular:  RRR, no m/r/g. No LE edema.  Respiratory:   CTA bilaterally with no wheezes/rales/rhonchi.  Normal respiratory effort. Abdomen:  soft, NT, ND Skin:  no rash or induration seen on limited exam Musculoskeletal:  grossly normal tone BUE/BLE, good ROM, no bony abnormality Psychiatric:  grossly normal mood and affect, speech fluent and appropriate, AOx3 Neurologic:  CN 2-12 grossly intact, moves all extremities in coordinated fashion   Data Reviewed: I have reviewed the patient's lab results since admission.  Pertinent labs for today include:   Na++ 132, not clinically significant K+ 3.4, replated Glucose 109 AP 301 Albumin 2.7 AST 206/ALT 204/Bili 17.2     Family Communication: Niece was present during evaluation  Disposition: Status is: Inpatient Remains inpatient appropriate because: needs further diagnosis and management     Time spent: 50 minutes  Unresulted Labs (From admission, onward)     Start     Ordered   05/03/23 1050  Cancer antigen 19-9  Add-on,   AD       Question:  Specimen collection  method  Answer:  Lab=Lab collect   05/03/23 1049   05/03/23 1050  CEA  Once,   R       Question:  Specimen collection method  Answer:  Lab=Lab collect   05/03/23 1049   05/03/23 0500  Comprehensive metabolic panel  Daily,   R      05/03/23 0241   05/03/23 0500  CBC  Daily,   R      05/03/23 0241             Author: Lorita Rosa, MD 05/03/2023 3:51 PM  For on call review  [email or web address].

## 2023-05-03 NOTE — Hospital Course (Addendum)
 18XW with h/o COPD, HTN, and prostate CA s/p radical prostatectomy (2018) who presented on 4/21 with painless jaundice and n/v/d.  Elevated LFTs.  CT with moderate intrahepatic biliary dilation, ?mass within the porta hepatis around the proximal CBD concerning for cholangiocarcinoma.  MRCP completed, surgery and GI consulted.

## 2023-05-03 NOTE — Plan of Care (Signed)
 Patient AAOx4. LR running at 37mL/hr via new 20gRFA. No complaints of pain this shift. LBM today. Safety precautions maintained.    Problem: Education: Goal: Knowledge of General Education information will improve Description: Including pain rating scale, medication(s)/side effects and non-pharmacologic comfort measures Outcome: Progressing   Problem: Clinical Measurements: Goal: Ability to maintain clinical measurements within normal limits will improve Outcome: Progressing Goal: Will remain free from infection Outcome: Progressing   Problem: Safety: Goal: Ability to remain free from injury will improve Outcome: Progressing

## 2023-05-04 DIAGNOSIS — K831 Obstruction of bile duct: Secondary | ICD-10-CM | POA: Diagnosis not present

## 2023-05-04 LAB — COMPREHENSIVE METABOLIC PANEL WITH GFR
ALT: 227 U/L — ABNORMAL HIGH (ref 0–44)
AST: 227 U/L — ABNORMAL HIGH (ref 15–41)
Albumin: 2.8 g/dL — ABNORMAL LOW (ref 3.5–5.0)
Alkaline Phosphatase: 337 U/L — ABNORMAL HIGH (ref 38–126)
Anion gap: 11 (ref 5–15)
BUN: 8 mg/dL (ref 8–23)
CO2: 26 mmol/L (ref 22–32)
Calcium: 9.4 mg/dL (ref 8.9–10.3)
Chloride: 96 mmol/L — ABNORMAL LOW (ref 98–111)
Creatinine, Ser: 0.53 mg/dL — ABNORMAL LOW (ref 0.61–1.24)
GFR, Estimated: >60 mL/min (ref >60)
Glucose, Bld: 114 mg/dL — ABNORMAL HIGH (ref 70–99)
Potassium: 3.4 mmol/L — ABNORMAL LOW (ref 3.5–5.1)
Sodium: 133 mmol/L — ABNORMAL LOW (ref 135–145)
Total Bilirubin: 19.8 mg/dL (ref 0.0–1.2)
Total Protein: 6.7 g/dL (ref 6.5–8.1)

## 2023-05-04 LAB — CANCER ANTIGEN 19-9: CA 19-9: 2144 U/mL — ABNORMAL HIGH (ref 0–35)

## 2023-05-04 LAB — CBC
HCT: 40.2 % (ref 39.0–52.0)
Hemoglobin: 14.1 g/dL (ref 13.0–17.0)
MCH: 32.3 pg (ref 26.0–34.0)
MCHC: 35.1 g/dL (ref 30.0–36.0)
MCV: 92.2 fL (ref 80.0–100.0)
Platelets: 223 10*3/uL (ref 150–400)
RBC: 4.36 MIL/uL (ref 4.22–5.81)
RDW: 15.9 % — ABNORMAL HIGH (ref 11.5–15.5)
WBC: 9.2 10*3/uL (ref 4.0–10.5)
nRBC: 0 % (ref 0.0–0.2)

## 2023-05-04 LAB — SURGICAL PCR SCREEN
MRSA, PCR: NEGATIVE
Staphylococcus aureus: NEGATIVE

## 2023-05-04 LAB — CEA: CEA: 4.8 ng/mL — ABNORMAL HIGH (ref 0.0–4.7)

## 2023-05-04 MED ORDER — MUPIROCIN 2 % EX OINT
1.0000 | TOPICAL_OINTMENT | Freq: Two times a day (BID) | CUTANEOUS | Status: DC
  Administered 2023-05-04 – 2023-05-07 (×7): 1 via NASAL
  Filled 2023-05-04: qty 22

## 2023-05-04 NOTE — Progress Notes (Signed)
 PROGRESS NOTE    Robert [name]  AOZ:[id number] DOB: 1955-03-30 DOA: 05/02/2023 PCP: [facility]   Brief Narrative:  [id number] with h/o COPD, HTN, and prostate CA s/p radical prostatectomy (2018) who presented on 4/21 with painless jaundice and n/v/d.  Elevated LFTs.  CT with moderate intrahepatic biliary dilation, ?mass within the porta hepatis around the proximal CBD concerning for cholangiocarcinoma.  MRCP completed, surgery and GI consulted.    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   Patch ordered  Mild hypokalemia Replete and reevaluate in a.m.  Mild hyponatremia Continue monitoring, hold HCTZ   Hypertension  Resume benazepril  and hold HCTZ given mild hyponatremia and soft blood pressure readings Hold ASA   HLD Hold atorvastatin  given elevated LFTs   Class 2 obesity Hold Wegovy  Body mass index is 32.56 kg/m.[name]  Weight loss should be encouraged Outpatient PCP/bariatric medicine f/u encouraged Significantly low or high BMI is associated with higher medical risk including morbidity and mortality     DVT prophylaxis:SCDs Code Status: Full Family Communication: None at bedside Disposition Plan:  Status is: Inpatient Remains inpatient appropriate because: Need for inpatient procedure and IV medications.   Consultants:  GI Gen Surg  Procedures:  None  Antimicrobials:  None  Subjective: Patient seen and evaluated today with no new acute complaints or  concerns. No acute concerns or events noted overnight.  He states he is overall feeling well with no abdominal pain, nausea or vomiting and is looking forward to breakfast.  Objective: Vitals:   05/03/23 2000 05/04/23 0500 05/04/23 0511 05/04/23 0755  BP: 133/66  126/62 110/63  Pulse: 60  (!) 53 (!) 57  Resp: 18  16 18   Temp: 99.4 F (37.4 C)  99.4 F (37.4 C) 97.9 F (36.6 C)  TempSrc: Oral  Oral Oral  SpO2: 96%  97% 99%  Weight:  94.1 kg    Height:        Intake/Output Summary (Last 24 hours) at 05/04/2023 0921 Last data filed at 05/04/2023 0441 Gross per 24 hour  Intake 2109.05 ml  Output --  Net 2109.05 ml   Filed Weights   05/03/23 1030 05/04/23 0500  Weight: 94.3 kg 94.1 kg    Examination:  General exam: Appears calm and comfortable, jaundiced Respiratory system: Clear to auscultation. Respiratory effort normal. Cardiovascular system: S1 & S2 heard, RRR.  Gastrointestinal system: Abdomen is soft Central nervous system: Alert and awake Extremities: No edema Skin: No significant lesions noted Psychiatry: Flat affect.    Data Reviewed: I have personally reviewed following labs and imaging studies  CBC: Recent Labs  Lab 05/02/23 1433 05/03/23 0528 05/04/23 0644  WBC 10.0 9.5 9.2  HGB 14.8 14.1 14.1  HCT 39.6 39.5 40.2  MCV 90.8 91.9 92.2  PLT 190 208 223   Basic Metabolic Panel: Recent Labs  Lab 05/02/23 1433 05/03/23 0528 05/04/23 0644  NA 131* 132* 133*  K 3.5 3.4* 3.4*  CL 94* 96* 96*  CO2 26 25 26   GLUCOSE 99 109* 114*  BUN  18 10 8   CREATININE 0.87 0.76 0.53*  CALCIUM  9.7 9.1 9.4  MG  --  1.8  --    GFR: Estimated Creatinine Clearance: 99.3 mL/min (A) (by C-G formula based on SCr of 0.53 mg/dL (L)). Liver Function Tests: Recent Labs  Lab 05/02/23 1433 05/03/23 0528 05/04/23 0644  AST 190* 206* 227*  ALT 210* 204* 227*  ALKPHOS 348* 301* 337*  BILITOT 17.0* 17.2* 19.8*  PROT 6.8 6.3* 6.7  ALBUMIN 3.8 2.7* 2.8*   Recent Labs   Lab 05/02/23 1433  LIPASE 16   No results for input(s): "AMMONIA" in the last 168 hours. Coagulation Profile: Recent Labs  Lab 05/03/23 0528  INR 1.0   Cardiac Enzymes: No results for input(s): "CKTOTAL", "CKMB", "CKMBINDEX", "TROPONINI" in the last 168 hours. BNP (last 3 results) No results for input(s): "PROBNP" in the last 8760 hours. HbA1C: No results for input(s): "HGBA1C" in the last 72 hours. CBG: No results for input(s): "GLUCAP" in the last 168 hours. Lipid Profile: No results for input(s): "CHOL", "HDL", "LDLCALC", "TRIG", "CHOLHDL", "LDLDIRECT" in the last 72 hours. Thyroid  Function Tests: No results for input(s): "TSH", "T4TOTAL", "FREET4", "T3FREE", "THYROIDAB" in the last 72 hours. Anemia Panel: No results for input(s): "VITAMINB12", "FOLATE", "FERRITIN", "TIBC", "IRON", "RETICCTPCT" in the last 72 hours. Sepsis Labs: No results for input(s): "PROCALCITON", "LATICACIDVEN" in the last 168 hours.  No results found for this or any previous visit (from the past 240 hours).       Radiology Studies: MR ABDOMEN MRCP W WO CONTAST Result Date: 05/03/2023 CLINICAL DATA:  Jaundice. History of prostate cancer with prostatectomy. EXAM: MRI ABDOMEN WITHOUT AND WITH CONTRAST (INCLUDING MRCP) TECHNIQUE: Multiplanar multisequence MR imaging of the abdomen was performed both before and after the administration of intravenous contrast. Heavily T2-weighted images of the biliary and pancreatic ducts were obtained, and three-dimensional MRCP images were rendered by post processing. CONTRAST:  10mL GADAVIST  GADOBUTROL  1 MMOL/ML IV SOLN COMPARISON:  Yesterday's CT.  CT of 03/30/2016 FINDINGS: Lower chest: Normal heart size without pericardial or pleural effusion. Hepatobiliary: No suspicious liver lesion. Moderate intrahepatic biliary duct dilatation. Gallbladder sludge without acute surrounding inflammation. Moderate intrahepatic biliary duct dilatation. This continues to the level of  the porta hepatis, were amorphous soft tissue signal mass is identified including on 18/4 and subtracted image 53/11104. Estimated at 3.4 x 2.6 cm on 55/11103. The common duct distal to this mass is normal in caliber, without choledocholithiasis. Pancreas:  Normal, without mass or ductal dilatation. Spleen:  Normal in size, without focal abnormality. Adrenals/Urinary Tract: Mild left adrenal thickening. Normal right adrenal gland and kidneys. Stomach/Bowel: Normal stomach and abdominal bowel loops. Vascular/Lymphatic: Aortic atherosclerosis. Combined celiac and SMA. The portal vein is intimately associated with the porta hepatis mass, but remains patent. Mild porta hepatis adenopathy at 1.6 cm on 14/4 was similar in 2018 (24/2 of that exam), favoring a reactive etiology. Other: No ascites. No evidence of omental or peritoneal disease. Fat containing ventral abdominal wall hernia. Musculoskeletal: No acute osseous abnormality. IMPRESSION: 1. Moderate intrahepatic biliary duct dilatation secondary to an amorphous 3.4 cm soft tissue signal lesion within the porta hepatis, favoring cholangiocarcinoma. 2. Mild porta hepatis adenopathy is present back to 2018, favored to be reactive. No evidence of metastatic disease. 3.  Aortic Atherosclerosis (ICD10-I70.0). 4. Fat containing ventral abdominal wall hernia. Electronically Signed   By: Lore Rode M.D.   On: 05/03/2023 10:40   MR 3D Recon At Scanner Result Date: 05/03/2023 CLINICAL DATA:  Jaundice. History of prostate cancer with prostatectomy. EXAM: MRI ABDOMEN WITHOUT AND WITH CONTRAST (INCLUDING MRCP) TECHNIQUE: Multiplanar multisequence MR imaging of the abdomen was performed both before and after the administration of intravenous contrast. Heavily T2-weighted images of the biliary and pancreatic ducts were obtained, and three-dimensional MRCP images were rendered by post processing. CONTRAST:  10mL GADAVIST  GADOBUTROL  1 MMOL/ML IV SOLN COMPARISON:  Yesterday's CT.   CT of 03/30/2016 FINDINGS: Lower chest: Normal heart size without pericardial or pleural effusion. Hepatobiliary: No suspicious liver lesion. Moderate intrahepatic biliary duct dilatation. Gallbladder sludge without acute surrounding inflammation. Moderate intrahepatic biliary duct dilatation. This continues to the level of the porta hepatis, were amorphous soft tissue signal mass is identified including on 18/4 and subtracted image 53/11104. Estimated at 3.4 x 2.6 cm on 55/11103. The common duct distal to this mass is normal in caliber, without choledocholithiasis. Pancreas:  Normal, without mass or ductal dilatation. Spleen:  Normal in size, without focal abnormality. Adrenals/Urinary Tract: Mild left adrenal thickening. Normal right adrenal gland and kidneys. Stomach/Bowel: Normal stomach and abdominal bowel loops. Vascular/Lymphatic: Aortic atherosclerosis. Combined celiac and SMA. The portal vein is intimately associated with the porta hepatis mass, but remains patent. Mild porta hepatis adenopathy at 1.6 cm on 14/4 was similar in 2018 (24/2 of that exam), favoring a reactive etiology. Other: No ascites. No evidence of omental or peritoneal disease. Fat containing ventral abdominal wall hernia. Musculoskeletal: No acute osseous abnormality. IMPRESSION: 1. Moderate intrahepatic biliary duct dilatation secondary to an amorphous 3.4 cm soft tissue signal lesion within the porta hepatis, favoring cholangiocarcinoma. 2. Mild porta hepatis adenopathy is present back to 2018, favored to be reactive. No evidence of metastatic disease. 3.  Aortic Atherosclerosis (ICD10-I70.0). 4. Fat containing ventral abdominal wall hernia. Electronically Signed   By: Lore Rode M.D.   On: 05/03/2023 10:40   CT ABDOMEN PELVIS W CONTRAST Result Date: 05/02/2023 CLINICAL DATA:  Adnexal mass, malignancy suspected seen at PCP in referred to ED due to increased yelling of skin and severe nausea, vomiting, diarrhea. Recently stopped  Wegovy . History of prostatectomy for prostate cancer. Allergy to Isovue . Patient given Benadryl  IV pre scan. EXAM: CT ABDOMEN AND PELVIS WITH CONTRAST TECHNIQUE: Multidetector CT imaging of the abdomen and pelvis was performed using the standard protocol following bolus administration of intravenous contrast. RADIATION DOSE REDUCTION: This exam was performed according to the departmental dose-optimization program which includes automated exposure control, adjustment of the mA and/or kV according to patient size and/or use of iterative reconstruction technique. CONTRAST:  OMNIPAQUE  IOHEXOL  300 MG/ML  SOLN COMPARISON:  03/30/2016 FINDINGS: Lower chest: No acute abnormality. Hepatobiliary: Since 2018 there is new moderate intrahepatic biliary dilation. Question enhancing mass within the porta hepatis about the proximal common bile duct (series 4/image 66) measuring 3.0 x 2.2 cm. There is mass effect on the common bile duct in this region with hyperenhancement of the wall of the common bile duct. Hyperdensity throughout the gallbladder. Hyperenhancement about the wall of the cystic duct. No focal hepatic lesion. Pancreas: Unremarkable. No pancreatic ductal dilatation or surrounding inflammatory changes. Spleen: Unremarkable. Adrenals/Urinary Tract: Normal adrenal glands. Nonobstructing left nephrolithiasis. No urinary calculi or hydronephrosis. Unremarkable bladder. Stomach/Bowel: Normal caliber large and small bowel. No bowel wall thickening. Stomach is within normal limits. Normal appendix. Vascular/Lymphatic: Aortic atherosclerosis. 12 mm left periaortic node (2/21) is not substantially changed from 03/30/2016. Reproductive: Prostatectomy. Other: No free intraperitoneal fluid or air. Fat containing periumbilical hernia. There is fat stranding within the herniated fat.  Musculoskeletal: No acute fracture or destructive osseous lesion. IMPRESSION: 1. Moderate intrahepatic biliary dilation. Question enhancing mass  within the porta hepatis about the proximal common bile duct. There is mass effect on the common bile duct in this region with hyperenhancement of the wall of the common bile duct. 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 Recommend further evaluation with MRI abdomen with and without contrast. 2. Hyperdensity throughout the gallbladder, likely sludge. 3. Fat containing periumbilical hernia. There is fat stranding within the herniated fat. Correlate for incarceration. 4. Nonobstructing left nephrolithiasis. 5. Aortic Atherosclerosis (ICD10-I70.0). Electronically Signed   By: Rozell Cornet M.D.   On: 05/02/2023 20:54        Scheduled Meds:  benazepril   20 mg Oral Daily   And   hydrochlorothiazide   25 mg Oral Daily   budeson-glycopyrrolate -formoterol   2 puff Inhalation BID   nicotine   21 mg Transdermal Daily   pneumococcal 20-valent conjugate vaccine  0.5 mL Intramuscular Tomorrow-1000     LOS: 1 day    Time spent: 55 minutes    Lakoda Raske Loran Rock, DO [facility]  If 7PM-7AM, please contact night-coverage [email or web address] 05/04/2023, 9:21 AM

## 2023-05-04 NOTE — Plan of Care (Signed)
 Patient AAOx4. No complaints of pain this shift. LBM today. Shower today. Safety precautions maintained.   Problem: Clinical Measurements: Goal: Ability to maintain clinical measurements within normal limits will improve Outcome: Progressing Goal: Diagnostic test results will improve Outcome: Progressing   Problem: Activity: Goal: Risk for activity intolerance will decrease Outcome: Progressing   Problem: Nutrition: Goal: Adequate nutrition will be maintained Outcome: Progressing

## 2023-05-04 NOTE — Plan of Care (Signed)

## 2023-05-05 ENCOUNTER — Encounter: Payer: BC Managed Care – PPO | Admitting: Internal Medicine

## 2023-05-05 DIAGNOSIS — K831 Obstruction of bile duct: Secondary | ICD-10-CM | POA: Diagnosis not present

## 2023-05-05 LAB — COMPREHENSIVE METABOLIC PANEL WITH GFR
ALT: 232 U/L — ABNORMAL HIGH (ref 0–44)
AST: 226 U/L — ABNORMAL HIGH (ref 15–41)
Albumin: 2.7 g/dL — ABNORMAL LOW (ref 3.5–5.0)
Alkaline Phosphatase: 347 U/L — ABNORMAL HIGH (ref 38–126)
Anion gap: 11 (ref 5–15)
BUN: 9 mg/dL (ref 8–23)
CO2: 27 mmol/L (ref 22–32)
Calcium: 9.4 mg/dL (ref 8.9–10.3)
Chloride: 96 mmol/L — ABNORMAL LOW (ref 98–111)
Creatinine, Ser: 0.56 mg/dL — ABNORMAL LOW (ref 0.61–1.24)
GFR, Estimated: >60 mL/min (ref >60)
Glucose, Bld: 112 mg/dL — ABNORMAL HIGH (ref 70–99)
Potassium: 3.5 mmol/L (ref 3.5–5.1)
Sodium: 134 mmol/L — ABNORMAL LOW (ref 135–145)
Total Bilirubin: 20.8 mg/dL (ref 0.0–1.2)
Total Protein: 6.6 g/dL (ref 6.5–8.1)

## 2023-05-05 LAB — CBC
HCT: 40.5 % (ref 39.0–52.0)
Hemoglobin: 14.2 g/dL (ref 13.0–17.0)
MCH: 32.1 pg (ref 26.0–34.0)
MCHC: 35.1 g/dL (ref 30.0–36.0)
MCV: 91.6 fL (ref 80.0–100.0)
Platelets: 240 10*3/uL (ref 150–400)
RBC: 4.42 MIL/uL (ref 4.22–5.81)
RDW: 16.4 % — ABNORMAL HIGH (ref 11.5–15.5)
WBC: 9.3 10*3/uL (ref 4.0–10.5)
nRBC: 0 % (ref 0.0–0.2)

## 2023-05-05 LAB — MAGNESIUM: Magnesium: 1.8 mg/dL (ref 1.7–2.4)

## 2023-05-05 MED ORDER — POTASSIUM CHLORIDE CRYS ER 20 MEQ PO TBCR
40.0000 meq | EXTENDED_RELEASE_TABLET | Freq: Once | ORAL | Status: AC
  Administered 2023-05-05: 40 meq via ORAL
  Filled 2023-05-05: qty 2

## 2023-05-05 NOTE — Progress Notes (Signed)
 Patient ID: Maksim Peregoy, male   DOB: 1955/12/11, 68 y.o.   MRN: [id number]    Progress Note   Subjective   Day #4 CC; jaundice , abdominal pain, abnormal imaging  Patient in good spirits, wife at bedside, no specific complaints today, is not having any abdominal pain has been able to eat without difficulty Afebrile  WBC 9.3/hemoglobin 14.2/hematocrit 40.5/platelets 240 BUN 9/ sodium 130/potassium 3.5 T. bili 20.8/alk phos 347/AST 226/ALT 232 INR was 1.0 on 05/03/2023  CA 19-9 2144 CEA 4.8    Objective   Vital signs in last 24 hours: Temp:  [97.7 F (36.5 C)-98.6 F (37 C)] 98.2 F (36.8 C) (04/24 1213) Pulse Rate:  [54-66] 58 (04/24 1213) Resp:  [18-19] 19 (04/24 1213) BP: (110-135)/(50-70) 127/70 (04/24 1213) SpO2:  [96 %-98 %] 97 % (04/24 1213) Last BM Date : 05/05/23 General:    Jaundiced older white male in NAD seen comfortably in bed, wife at bedside Heart:  Regular rate and rhythm; no murmurs Lungs: Respirations even and unlabored, lungs CTA bilaterally Abdomen:  Soft, nontender and nondistended.  Palpable mass or hepatosplenomegaly normal bowel sounds. Extremities: Jaundiced Neurologic:  Alert and oriented,  grossly normal neurologically. Psych:  Cooperative. Normal mood and affect.  Intake/Output from previous day: No intake/output data recorded. Intake/Output this shift: No intake/output data recorded.  Lab Results: Recent Labs    05/03/23 0528 05/04/23 0644 05/05/23 0703  WBC 9.5 9.2 9.3  HGB 14.1 14.1 14.2  HCT 39.5 40.2 40.5  PLT 208 223 240   BMET Recent Labs    05/03/23 0528 05/04/23 0644 05/05/23 0703  NA 132* 133* 134*  K 3.4* 3.4* 3.5  CL 96* 96* 96*  CO2 25 26 27   GLUCOSE 109* 114* 112*  BUN 10 8 9   CREATININE 0.76 0.53* 0.56*  CALCIUM  9.1 9.4 9.4   LFT Recent Labs    05/05/23 0703  PROT 6.6  ALBUMIN 2.7*  AST 226*  ALT 232*  ALKPHOS 347*  BILITOT 20.8*   PT/INR Recent Labs    05/03/23 0528  LABPROT 13.6  INR 1.0         Assessment / Plan:    #3 68 year old white male dated with 2-week history of feeling poorly with decreased appetite and some nausea, had noticed darkening of his urine and onset of pruritus but was away unaware that he was jaundiced till seen by PCP on 05/02/2023. Did have an acute illness in the midst of this with nausea vomiting and diarrhea which lasted for a few days and has since resolved-likely viral Found to have bilirubin of 17.1 on admission  Workup since admission with CT and MRI/MRCP-shows no suspicious liver lesions, moderate intrahepatic biliary ductal dilation, gallbladder sludge moderate intrahepatic biliary ductal dilation to the level of the porta hepatis where there is an amorphous soft tissue mass estimated at 3.4 x 2.6 cm.  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  Hypertension #4.  Hyperlipidemia  Plan; n.p.o. after midnight Has been scheduled for ERCP, brushings and stent placements with Dr. Brice Campi  for tomorrow 05/06/2023.  Procedures have been reviewed in detail with the patient including indications risk and benefits and he is agreeable to proceed.  Recommendations pending findings at ERCP.    Principal Problem:   Obstructive jaundice Active Problems:   Essential  hypertension   COPD, severe (HCC)     LOS: 2 days   Palestine Mosco EsterwoodPA-C  05/05/2023, 5:21 PM

## 2023-05-05 NOTE — Care Management (Signed)
 Transition of Care [facility]) - Inpatient Brief Assessment   Patient Details  Name: Robert [name] MRN: [id number] Date of Birth: 29-Dec-1955  Transition of Care [facility] CM/SW Contact:    Ronni Colace, RN Phone Number: 05/05/2023, 12:45 PM   Clinical Narrative:   68 year old patient history of Prostate ca, smoker presented with jaundice, potential chologenic carcinoma.  ERCP in AM. Independent, has son to call on. NO needs at this time, The patient will be discussed in daily progressive rounds. If a need is identified, please place a TOC consult.   Transition of Care Asessment: Insurance and Status: Insurance coverage has been reviewed Patient has primary care physician: Yes   Prior level of function:: Independent Prior/Current Home Services: No current home services Social Drivers of Health Review: SDOH reviewed no interventions necessary Readmission risk has been reviewed: Yes Transition of care needs: no transition of care needs at this time

## 2023-05-05 NOTE — Progress Notes (Signed)
 PROGRESS NOTE    Robert [name]  ZOX:[id number] DOB: March 23, 1955 DOA: 05/02/2023 PCP: [facility]   Brief Narrative:   [phone] with h/o COPD, HTN, and prostate CA s/p radical prostatectomy (2018) who presented on 4/21 with painless jaundice and n/v/d.  Elevated LFTs.  CT with moderate intrahepatic biliary dilation, ?mass within the porta hepatis around the proximal CBD concerning for cholangiocarcinoma.  MRCP completed, surgery and GI consulted.  Plan is for ERCP 4/25.  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   Patch ordered  Mild hyponatremia-improving Continue monitoring, hold HCTZ   Hypertension  Resume benazepril  and hold HCTZ given mild hyponatremia and soft blood pressure readings, now stable Hold ASA   HLD Hold atorvastatin  given elevated LFTs   Class 2 obesity Hold Wegovy  Body mass index is 32.56 kg/m.Robert [name]  Weight loss should be encouraged Outpatient PCP/bariatric medicine f/u encouraged Significantly low or high BMI is associated with higher medical risk including morbidity and mortality     DVT prophylaxis:SCDs Code Status: Full Family Communication: None at bedside Disposition Plan:  Status is: Inpatient Remains inpatient appropriate because: Need for inpatient procedure and IV medications.   Consultants:  GI Gen Surg  Procedures:  None  Antimicrobials:  None  Subjective: Patient seen and evaluated today with no new acute  complaints or concerns. No acute concerns or events noted overnight.  He states he is overall feeling well with no abdominal pain, nausea or vomiting and complains of some mild itching.  Objective: Vitals:   05/04/23 2122 05/05/23 0451 05/05/23 0719 05/05/23 0740  BP: 134/70 (!) 110/50 135/70   Pulse: 66 (!) 54 (!) 55   Resp:  18 19   Temp: 98.6 F (37 C) 98 F (36.7 C) 97.7 F (36.5 C)   TempSrc: Oral Oral Oral   SpO2: 96% 97% 97% 98%  Weight:      Height:       No intake or output data in the 24 hours ending 05/05/23 0939  Filed Weights   05/03/23 1030 05/04/23 0500  Weight: 94.3 kg 94.1 kg    Examination:  General exam: Appears calm and comfortable, jaundiced Respiratory system: Clear to auscultation. Respiratory effort normal. Cardiovascular system: S1 & S2 heard, RRR.  Gastrointestinal system: Abdomen is soft Central nervous system: Alert and awake Extremities: No edema Skin: No significant lesions noted Psychiatry: Flat affect.    Data Reviewed: I have personally reviewed following labs and imaging studies  CBC: Recent Labs  Lab 05/02/23 1433 05/03/23 0528 05/04/23 0644 05/05/23 0703  WBC 10.0 9.5 9.2 9.3  HGB 14.8 14.1 14.1 14.2  HCT 39.6 39.5 40.2 40.5  MCV 90.8 91.9 92.2 91.6  PLT 190 208 223 240   Basic Metabolic Panel: Recent Labs  Lab 05/02/23 1433 05/03/23 0528 05/04/23 0644 05/05/23 0703  NA 131* 132* 133* 134*  K 3.5 3.4* 3.4* 3.5  CL 94* 96* 96* 96*  CO2 26 25 26 27   GLUCOSE 99 109* 114* 112*  BUN 18  10 8 9   CREATININE 0.87 0.76 0.53* 0.56*  CALCIUM  9.7 9.1 9.4 9.4  MG  --  1.8  --  1.8   GFR: Estimated Creatinine Clearance: 99.3 mL/min (A) (by C-G formula based on SCr of 0.56 mg/dL (L)). Liver Function Tests: Recent Labs  Lab 05/02/23 1433 05/03/23 0528 05/04/23 0644 05/05/23 0703  AST 190* 206* 227* 226*  ALT 210* 204* 227* 232*  ALKPHOS 348* 301* 337* 347*  BILITOT 17.0* 17.2* 19.8* 20.8*  PROT 6.8 6.3* 6.7 6.6   ALBUMIN 3.8 2.7* 2.8* 2.7*   Recent Labs  Lab 05/02/23 1433  LIPASE 16   No results for input(s): "AMMONIA" in the last 168 hours. Coagulation Profile: Recent Labs  Lab 05/03/23 0528  INR 1.0   Cardiac Enzymes: No results for input(s): "CKTOTAL", "CKMB", "CKMBINDEX", "TROPONINI" in the last 168 hours. BNP (last 3 results) No results for input(s): "PROBNP" in the last 8760 hours. HbA1C: No results for input(s): "HGBA1C" in the last 72 hours. CBG: No results for input(s): "GLUCAP" in the last 168 hours. Lipid Profile: No results for input(s): "CHOL", "HDL", "LDLCALC", "TRIG", "CHOLHDL", "LDLDIRECT" in the last 72 hours. Thyroid  Function Tests: No results for input(s): "TSH", "T4TOTAL", "FREET4", "T3FREE", "THYROIDAB" in the last 72 hours. Anemia Panel: No results for input(s): "VITAMINB12", "FOLATE", "FERRITIN", "TIBC", "IRON", "RETICCTPCT" in the last 72 hours. Sepsis Labs: No results for input(s): "PROCALCITON", "LATICACIDVEN" in the last 168 hours.  Recent Results (from the past 240 hours)  Surgical PCR screen     Status: None   Collection Time: 05/04/23 11:01 AM   Specimen: Nasal Mucosa; Nasal Swab  Result Value Ref Range Status   MRSA, PCR NEGATIVE NEGATIVE Final   Staphylococcus aureus NEGATIVE NEGATIVE Final    Comment: (NOTE) The Xpert SA Assay (FDA approved for NASAL specimens in patients 56 years of age and older), is one component of a comprehensive surveillance program. It is not intended to diagnose infection nor to guide or monitor treatment. Performed at [facility], [address], [place], [place] [address]      Radiology Studies: No results found.     Scheduled Meds:  benazepril   20 mg Oral Daily   budeson-glycopyrrolate -formoterol   2 puff Inhalation BID   mupirocin  ointment  1 Application Nasal BID   nicotine   21 mg Transdermal Daily   pneumococcal 20-valent conjugate vaccine  0.5 mL Intramuscular Tomorrow-1000     LOS: 2 days     Time spent: 55 minutes    Robert [name] Robert Rock, DO [facility]  If 7PM-7AM, please contact night-coverage [email or web address] 05/05/2023, 9:39 AM

## 2023-05-05 NOTE — H&P (View-Only) (Signed)
 Patient ID: Robert [name], male   DOB: 1955/12/11, 68 y.o.   MRN: [id number]    Progress Note   Subjective   Day #4 CC; jaundice , abdominal pain, abnormal imaging  Patient in good spirits, wife at bedside, no specific complaints today, is not having any abdominal pain has been able to eat without difficulty Afebrile  WBC 9.3/hemoglobin 14.2/hematocrit 40.5/platelets 240 BUN 9/ sodium 130/potassium 3.5 T. bili 20.8/alk phos 347/AST 226/ALT 232 INR was 1.0 on 05/03/2023  CA 19-9 2144 CEA 4.8    Objective   Vital signs in last 24 hours: Temp:  [97.7 F (36.5 C)-98.6 F (37 C)] 98.2 F (36.8 C) (04/24 1213) Pulse Rate:  [54-66] 58 (04/24 1213) Resp:  [18-19] 19 (04/24 1213) BP: (110-135)/(50-70) 127/70 (04/24 1213) SpO2:  [96 %-98 %] 97 % (04/24 1213) Last BM Date : 05/05/23 General:    Jaundiced older white male in NAD seen comfortably in bed, wife at bedside Heart:  Regular rate and rhythm; no murmurs Lungs: Respirations even and unlabored, lungs CTA bilaterally Abdomen:  Soft, nontender and nondistended.  Palpable mass or hepatosplenomegaly normal bowel sounds. Extremities: Jaundiced Neurologic:  Alert and oriented,  grossly normal neurologically. Psych:  Cooperative. Normal mood and affect.  Intake/Output from previous day: No intake/output data recorded. Intake/Output this shift: No intake/output data recorded.  Lab Results: Recent Labs    05/03/23 0528 05/04/23 0644 05/05/23 0703  WBC 9.5 9.2 9.3  HGB 14.1 14.1 14.2  HCT 39.5 40.2 40.5  PLT 208 223 240   BMET Recent Labs    05/03/23 0528 05/04/23 0644 05/05/23 0703  NA 132* 133* 134*  K 3.4* 3.4* 3.5  CL 96* 96* 96*  CO2 25 26 27   GLUCOSE 109* 114* 112*  BUN 10 8 9   CREATININE 0.76 0.53* 0.56*  CALCIUM  9.1 9.4 9.4   LFT Recent Labs    05/05/23 0703  PROT 6.6  ALBUMIN 2.7*  AST 226*  ALT 232*  ALKPHOS 347*  BILITOT 20.8*   PT/INR Recent Labs    05/03/23 0528  LABPROT 13.6  INR 1.0         Assessment / Plan:    #3 68 year old white male dated with 2-week history of feeling poorly with decreased appetite and some nausea, had noticed darkening of his urine and onset of pruritus but was away unaware that he was jaundiced till seen by PCP on 05/02/2023. Did have an acute illness in the midst of this with nausea vomiting and diarrhea which lasted for a few days and has since resolved-likely viral Found to have bilirubin of 17.1 on admission  Workup since admission with CT and MRI/MRCP-shows no suspicious liver lesions, moderate intrahepatic biliary ductal dilation, gallbladder sludge moderate intrahepatic biliary ductal dilation to the level of the porta hepatis where there is an amorphous soft tissue mass estimated at 3.4 x 2.6 cm.  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  Hypertension #4.  Hyperlipidemia  Plan; n.p.o. after midnight Has been scheduled for ERCP, brushings and stent placements with Dr. Brice Campi  for tomorrow 05/06/2023.  Procedures have been reviewed in detail with the patient including indications risk and benefits and he is agreeable to proceed.  Recommendations pending findings at ERCP.    Principal Problem:   Obstructive jaundice Active Problems:   Essential  hypertension   COPD, severe (HCC)     LOS: 2 days   Palestine Mosco EsterwoodPA-C  05/05/2023, 5:21 PM

## 2023-05-06 ENCOUNTER — Inpatient Hospital Stay (HOSPITAL_COMMUNITY)

## 2023-05-06 ENCOUNTER — Telehealth: Payer: Self-pay

## 2023-05-06 ENCOUNTER — Encounter (HOSPITAL_COMMUNITY)
Admission: EM | Disposition: A | Discharge status: Home / Self Care | Payer: Self-pay | Source: Ambulatory Visit | Attending: Internal Medicine

## 2023-05-06 ENCOUNTER — Encounter (HOSPITAL_COMMUNITY): Payer: Self-pay | Admitting: Family Medicine

## 2023-05-06 DIAGNOSIS — K831 Obstruction of bile duct: Secondary | ICD-10-CM

## 2023-05-06 DIAGNOSIS — K838 Other specified diseases of biliary tract: Secondary | ICD-10-CM | POA: Diagnosis not present

## 2023-05-06 DIAGNOSIS — C240 Malignant neoplasm of extrahepatic bile duct: Principal | ICD-10-CM

## 2023-05-06 DIAGNOSIS — K2970 Gastritis, unspecified, without bleeding: Secondary | ICD-10-CM

## 2023-05-06 DIAGNOSIS — I10 Essential (primary) hypertension: Secondary | ICD-10-CM | POA: Diagnosis not present

## 2023-05-06 DIAGNOSIS — K2960 Other gastritis without bleeding: Secondary | ICD-10-CM

## 2023-05-06 DIAGNOSIS — J449 Chronic obstructive pulmonary disease, unspecified: Secondary | ICD-10-CM | POA: Diagnosis not present

## 2023-05-06 DIAGNOSIS — T85528A Displacement of other gastrointestinal prosthetic devices, implants and grafts, initial encounter: Secondary | ICD-10-CM

## 2023-05-06 HISTORY — PX: BILIARY BRUSHING: SHX6843

## 2023-05-06 HISTORY — PX: BILIARY STENT PLACEMENT: SHX5538

## 2023-05-06 HISTORY — PX: PANCREATIC STENT PLACEMENT: SHX5539

## 2023-05-06 HISTORY — PX: BIOPSY OF SKIN SUBCUTANEOUS TISSUE AND/OR MUCOUS MEMBRANE: SHX6741

## 2023-05-06 HISTORY — PX: ERCP: SHX5425

## 2023-05-06 LAB — CBC
HCT: 37.8 % — ABNORMAL LOW (ref 39.0–52.0)
Hemoglobin: 13.4 g/dL (ref 13.0–17.0)
MCH: 32.9 pg (ref 26.0–34.0)
MCHC: 35.4 g/dL (ref 30.0–36.0)
MCV: 92.9 fL (ref 80.0–100.0)
Platelets: 219 10*3/uL (ref 150–400)
RBC: 4.07 MIL/uL — ABNORMAL LOW (ref 4.22–5.81)
RDW: 17.2 % — ABNORMAL HIGH (ref 11.5–15.5)
WBC: 8.9 10*3/uL (ref 4.0–10.5)
nRBC: 0 % (ref 0.0–0.2)

## 2023-05-06 LAB — MAGNESIUM: Magnesium: 1.7 mg/dL (ref 1.7–2.4)

## 2023-05-06 LAB — BILIRUBIN, DIRECT: Bilirubin, Direct: 12.3 mg/dL — ABNORMAL HIGH (ref 0.0–0.2)

## 2023-05-06 SURGERY — ERCP, WITH INTERVENTION IF INDICATED
Anesthesia: General

## 2023-05-06 MED ORDER — GLUCAGON HCL RDNA (DIAGNOSTIC) 1 MG IJ SOLR
INTRAMUSCULAR | Status: AC
  Filled 2023-05-06: qty 1

## 2023-05-06 MED ORDER — LIDOCAINE 2% (20 MG/ML) 5 ML SYRINGE
INTRAMUSCULAR | Status: DC | PRN
  Administered 2023-05-06: 60 mg via INTRAVENOUS

## 2023-05-06 MED ORDER — EPHEDRINE SULFATE-NACL 50-0.9 MG/10ML-% IV SOSY
PREFILLED_SYRINGE | INTRAVENOUS | Status: DC | PRN
  Administered 2023-05-06: 5 mg via INTRAVENOUS

## 2023-05-06 MED ORDER — IOHEXOL 350 MG/ML SOLN
50.0000 mL | Freq: Once | INTRAVENOUS | Status: AC | PRN
  Administered 2023-05-06: 50 mL via INTRAVENOUS

## 2023-05-06 MED ORDER — PANTOPRAZOLE SODIUM 40 MG PO TBEC
40.0000 mg | DELAYED_RELEASE_TABLET | Freq: Every day | ORAL | Status: DC
  Administered 2023-05-06 – 2023-05-07 (×2): 40 mg via ORAL
  Filled 2023-05-06 (×2): qty 1

## 2023-05-06 MED ORDER — SUGAMMADEX SODIUM 200 MG/2ML IV SOLN
INTRAVENOUS | Status: DC | PRN
  Administered 2023-05-06: 200 mg via INTRAVENOUS

## 2023-05-06 MED ORDER — DICLOFENAC SUPPOSITORY 100 MG
RECTAL | Status: DC | PRN
  Administered 2023-05-06: 100 mg via RECTAL

## 2023-05-06 MED ORDER — GLUCAGON HCL RDNA (DIAGNOSTIC) 1 MG IJ SOLR
INTRAMUSCULAR | Status: DC | PRN
  Administered 2023-05-06 (×4): .25 mL via INTRAVENOUS

## 2023-05-06 MED ORDER — PHENYLEPHRINE HCL-NACL 20-0.9 MG/250ML-% IV SOLN
INTRAVENOUS | Status: DC | PRN
  Administered 2023-05-06: 50 ug/min via INTRAVENOUS

## 2023-05-06 MED ORDER — DEXAMETHASONE SODIUM PHOSPHATE 10 MG/ML IJ SOLN
INTRAMUSCULAR | Status: DC | PRN
  Administered 2023-05-06: 5 mg via INTRAVENOUS

## 2023-05-06 MED ORDER — ONDANSETRON HCL 4 MG/2ML IJ SOLN
INTRAMUSCULAR | Status: DC | PRN
  Administered 2023-05-06: 4 mg via INTRAVENOUS

## 2023-05-06 MED ORDER — CIPROFLOXACIN IN D5W 400 MG/200ML IV SOLN
INTRAVENOUS | Status: DC | PRN
  Administered 2023-05-06: 400 mg via INTRAVENOUS

## 2023-05-06 MED ORDER — ROCURONIUM BROMIDE 10 MG/ML (PF) SYRINGE
PREFILLED_SYRINGE | INTRAVENOUS | Status: DC | PRN
  Administered 2023-05-06: 70 mg via INTRAVENOUS

## 2023-05-06 MED ORDER — LACTATED RINGERS IV SOLN: INTRAVENOUS | Status: DC | PRN

## 2023-05-06 MED ORDER — CIPROFLOXACIN IN D5W 400 MG/200ML IV SOLN
INTRAVENOUS | Status: AC
  Filled 2023-05-06: qty 200

## 2023-05-06 MED ORDER — DICLOFENAC SUPPOSITORY 100 MG
RECTAL | Status: AC
  Filled 2023-05-06: qty 1

## 2023-05-06 MED ORDER — PHENYLEPHRINE 80 MCG/ML (10ML) SYRINGE FOR IV PUSH (FOR BLOOD PRESSURE SUPPORT)
PREFILLED_SYRINGE | INTRAVENOUS | Status: DC | PRN
  Administered 2023-05-06: 80 ug via INTRAVENOUS

## 2023-05-06 MED ORDER — CIPROFLOXACIN HCL 500 MG PO TABS
500.0000 mg | ORAL_TABLET | Freq: Two times a day (BID) | ORAL | Status: DC
  Administered 2023-05-06: 500 mg via ORAL
  Filled 2023-05-06 (×3): qty 1

## 2023-05-06 MED ORDER — SODIUM CHLORIDE 0.9 % IV SOLN
INTRAVENOUS | Status: DC | PRN
  Administered 2023-05-06: 50 mL

## 2023-05-06 MED ORDER — PROPOFOL 10 MG/ML IV BOLUS
INTRAVENOUS | Status: DC | PRN
  Administered 2023-05-06: 200 mg via INTRAVENOUS

## 2023-05-06 NOTE — Anesthesia Procedure Notes (Signed)
 Procedure Name: Intubation Date/Time: 05/06/2023 11:55 AM  Performed by: Cloy Cozzens A, CRNAPre-anesthesia Checklist: Patient identified, Emergency Drugs available, Suction available and Patient being monitored Patient Re-evaluated:Patient Re-evaluated prior to induction Oxygen  Delivery Method: Circle System Utilized Preoxygenation: Pre-oxygenation with 100% oxygen  Induction Type: IV induction Ventilation: Mask ventilation without difficulty Laryngoscope Size: Mac and 4 Grade View: Grade II Tube type: Oral Tube size: 7.5 mm Number of attempts: 1 Airway Equipment and Method: Stylet and Oral airway Placement Confirmation: ETT inserted through vocal cords under direct vision, positive ETCO2 and breath sounds checked- equal and bilateral Secured at: 22 cm Tube secured with: Tape Dental Injury: Teeth and Oropharynx as per pre-operative assessment

## 2023-05-06 NOTE — Interval H&P Note (Signed)
 History and Physical Interval Note:  05/06/2023 11:41 AM  Robert [name]  has presented today for surgery, with the diagnosis of biliary obstruction, jaundice.  The various methods of treatment have been discussed with the patient and family. After consideration of risks, benefits and other options for treatment, the patient has consented to  Procedure(s): ERCP, WITH INTERVENTION IF INDICATED (N/A) as a surgical intervention.  The patient's history has been reviewed, patient examined, no change in status, stable for surgery.  I have reviewed the patient's chart and labs.  Questions were answered to the patient's satisfaction.    The risks of an ERCP were discussed at length, including but not limited to the risk of perforation, bleeding, abdominal pain, post-ERCP pancreatitis (while usually mild can be severe and even life threatening).  If unable to cannulate/improve biliary obstruction he will need PTBD via IR.    Joni Colegrove Mansouraty Jr

## 2023-05-06 NOTE — Anesthesia Postprocedure Evaluation (Signed)
 Anesthesia Post Note  Patient: Robert [name]  Procedure(s) Performed: ERCP, WITH INTERVENTION IF INDICATED BIOPSY, SKIN, SUBCUTANEOUS TISSUE, OR MUCOUS MEMBRANE BRUSH BIOPSY, BILE DUCT INSERTION, STENT, BILE DUCT INSERTION, STENT, PANCREATIC DUCT     Patient location during evaluation: PACU Anesthesia Type: General Level of consciousness: awake and alert Pain management: pain level controlled Vital Signs Assessment: post-procedure vital signs reviewed and stable Respiratory status: spontaneous breathing, nonlabored ventilation, respiratory function stable and patient connected to nasal cannula oxygen  Cardiovascular status: blood pressure returned to baseline and stable Postop Assessment: no apparent nausea or vomiting Anesthetic complications: no   No notable events documented.  Last Vitals:  Vitals:   05/06/23 1420 05/06/23 1430  BP: 118/64 119/66  Pulse: 66 67  Resp: 16 11  Temp:    SpO2: 94% 97%    Last Pain:  Vitals:   05/06/23 1430  TempSrc:   PainSc: 0-No pain                 Lethaniel Rave

## 2023-05-06 NOTE — Progress Notes (Addendum)
  Progress Note   Patient: Robert [name] UEA:[id number] DOB: Jul 27, 1955 DOA: 05/02/2023     3 DOS: the patient was seen and examined on 05/06/2023 at 10:06AM      Brief hospital course: 68 y.o. M with HTN, HLD, prosCA obesity and COPD not on O2 who presented with jaundice.    Developed diarrhea, itching, vomiting, dark urine recently, >10 BM per day, went to see his PCP who noted painless jaundice, sent him to the ER.  In the ER, Tbili 17, CT showed possible enhancing mass in the porta hepatis.  Admitted for biliary obstruction.    Assessment and Plan: Biliary obstruction Suspected biliary mass CT abdomen showed abnormality in porta hepatis and folow up MRCP showed "Moderate intrahepatic biliary duct dilatation secondary to an amorphous 3.4 cm soft tissue signal lesion within the porta hepatis, favoring cholangiocarcinoma."  GI consulted, ERCP 4/25 with successful stent placement - Trend LFTs and lipase  - ADAT - Follow up biopsy with GI after discharge - Hold statin given AST/ALT >3x ULN    ADDENDUM: GI post-procedure recommendations: - Check liver enzymes (AST, ALT, alkaline phosphatase, bilirubin) in the morning. - Initiate Protonix  40 mg once daily. - Hold heparin  VTE prophylaxis for at least 24 hours - Recommend patient have CT chest for completion of potential staging of what appears to be a malignant [place] tumor - Referral to oncology - KUB 2-view in 10-14 days to ensure pancreatic stent has migrated successfully. If still present at that time will need to be scheduled for EGD with stent pull.     Hypertension Hyperlipidemia BP low normal - Continue benazepril  - Hold hydrochlorothiazide  -Hold statin due to transaminitis  COPD No active symptoms -Continue ICS/LABA/LAMA  Smoking - Continue nicotine  patch  Hyponatremia Mild, asymptomatic - Hold hydrochlorothiazide   Class II obesity BMI 37 > 32 on Wegovy  - Hold Wegovy  at discharge given  vomiting           Subjective: Patient feels well, no significant diarrhea or vomiting today.  No fever.  Going for ERCP shortly.     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

## 2023-05-06 NOTE — Telephone Encounter (Signed)
-----   Message from [facility] sent at 05/06/2023  2:18 PM EDT ----- Regarding: Stent Followup Robert [name], This patient needs KUB in 2-weeks for Pancreatic Stent followup. This patient needs ERCP recall in 78-months for stent exchange. Thanks. GM

## 2023-05-06 NOTE — Transfer of Care (Signed)
 Immediate Anesthesia Transfer of Care Note  Patient: Robert [name]  Procedure(s) Performed: ERCP, WITH INTERVENTION IF INDICATED  Patient Location: PACU  Anesthesia Type:General  Level of Consciousness: awake, alert , and oriented  Airway & Oxygen  Therapy: Patient Spontanous Breathing and Patient connected to nasal cannula oxygen   Post-op Assessment: Report given to RN and Post -op Vital signs reviewed and stable  Post vital signs: Reviewed and stable  Last Vitals:  Vitals Value Taken Time  BP    Temp    Pulse    Resp    SpO2      Last Pain:  Vitals:   05/06/23 1043  TempSrc: Temporal  PainSc: 0-No pain      Patients Stated Pain Goal: 0 (05/03/23 2000)  Complications: No notable events documented.

## 2023-05-06 NOTE — Op Note (Addendum)
 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 Estimated Blood Loss:     Estimated blood loss was minimal. Procedure:                Pre-Anesthesia Assessment:                           - Prior to the procedure, a History and Physical                            was performed, and patient medications and                            allergies were reviewed. The patient's tolerance of                            previous anesthesia was also reviewed. The risks                            and benefits of the procedure and the sedation                            options and risks were discussed with the patient.                            All questions were answered, and informed consent                            was obtained. Prior Anticoagulants: The patient has  taken no anticoagulant or antiplatelet agents                            except for aspirin . ASA Grade Assessment: III - A                            patient with severe systemic disease. After                            reviewing the risks and benefits, the patient was                            deemed in satisfactory condition to  undergo the                            procedure.                           After obtaining informed consent, the scope was                            passed under direct vision. Throughout the                            procedure, the patient's blood pressure, pulse, and                            oxygen  saturations were monitored continuously. The                            [employer] D single use                            duodenoscope was introduced through the mouth, and                            used to inject contrast into and used to cannulate                            the bile duct. The ERCP was technically difficult                            and complex due to challenging cannulation.                            Successful completion of the procedure was aided by                            changing the patient's position, using manual                            pressure, straightening and shortening the scope to  obtain bowel loop reduction and using scope                            torsion. The patient tolerated the procedure. Scope In: Scope Out: Findings:      The scout film was normal.      The upper GI tract was traversed under direct vision without detailed       examination. Patchy moderate inflammation characterized by erosions and       erythema was found in the entire examined stomach - biopsied for H.       pylori assessment. No gross lesions were noted in the duodenal bulb, in       the first portion of the duodenum and in the second portion of the       duodenum. The major papilla was normal.      Repeated attempts at biliary cannulation were not successful while using       a wire-guided approach in the short position in the semilong position       (long position did not allow for adequate visualization of the major       papilla). In the semilong position, eventually this led to placement of       the wire within  the pancreatic duct. Decision was made to pursue a       double-wire approach. The 0.035 inch x 260 cm straight Hydra Jagwire was       left within the pancreatic duct.      The bile duct could not be cannulated further with attempt at double       wire technique this is in a semilong and short positions). We proceeded       with placement of one 4 Fr by 5 cm temporary plastic pancreatic stent       with a single external pigtail was placed into the ventral pancreatic       duct. The stent was in good position.      Subsequently, in a semilong position with the sphincterotome slightly       entered into the major papilla, a 0.035 inch x 260 cm straight Hydra       Jagwire was passed into the biliary tree. The Hydratome sphincterotome       was passed over the guidewire and the bile duct was then deeply       cannulated. Contrast was injected. I personally interpreted the bile       duct images. Ductal flow of contrast was adequate. Image quality was       adequate. Contrast extended to the hepatic ducts. Opacification of the       entire biliary tree except for the cystic duct and gallbladder was       successful. The maximum diameter of the ducts was 6 mm of the common       bile duct. The left and right hepatic ducts with secondary or tertiary       branches of the intrahepatic ducts (Bismuth IV) contained a single       moderate stenosis 15 mm in length. The left and right hepatic ducts and       all intrahepatic branches were moderately dilated, secondary to       aforementioned stricture. The largest diameter was 12 mm. A 7 mm biliary       sphincterotomy was made with a monofilament Hydratome sphincterotome  using ERBE electrocautery. There was no post-sphincterotomy bleeding. A       short 0.025 inch angled revolution Jagwire was passed into the biliary       tree, was felt to be able to enter into the right hepatic system. I also       then placed another 0.025 inch angled  revolution Jagwire into what was       felt to be more of the left system. To discover objects, the biliary       tree was swept with a Hydratome sphincterotome. Small amounts of sludge       were swept from the duct. Cells for cytology were obtained by brushing       in the hepatic duct bifurcation stricture/stenosis (3 separate brushes).       One 7 Fr by 12 cm transpapillary plastic biliary stent with a single       external flap and a single internal flap was placed into the presumed       left hepatic duct. The stent was in good position. One 7 Fr by 9 cm       transpapillary plastic biliary stent with a single external flap and a       single internal flap was placed into the presumed right hepatic duct.       The stent were in good position. I did not feel that the third biliary       stent would fit with the patient's current sphincterotomy site and would       likely require central plasty but as I already had placed stents into       what were the presumed left and right systems, I decided not to try and       place another stent.      A pancreatogram was not performed.      The duodenoscope was withdrawn from the patient. Impression:               - Gastritis. Biopsied.                           - No gross lesions in the duodenal bulb, in the                            first portion of the duodenum and in the second                            portion of the duodenum.                           - The major papilla appeared normal.                           - Difficult cannulation as described above                            requiring double wire and subsequent pancreatic                            stent double wire technique.                           -  A biliary sphincterotomy was performed.                           - The biliary tree was swept and sludge was found.                           - A single moderate biliary stricture was found in                            the  hepatic duct system (Bismuth IV). The stricture                            was malignant appearing. This led to dilation of                            the left and right biliary systems.                           - The stricture site was brushed for cytology.                           - One plastic biliary stent was placed into the                            left hepatic duct.                           - One plastic biliary stent was placed into the                            right hepatic duct.                           - One temporary plastic pancreatic stent was placed                            into the ventral pancreatic duct to aid in                            cannulation and also decreased risk of PEP. Recommendation:           - The patient will be observed post-procedure,                            until all discharge criteria are met.                           - Return patient to hospital ward for ongoing care.                           - Advance diet as tolerated.                           - Observe patient's clinical course.                           -  Check liver enzymes (AST, ALT, alkaline                            phosphatase, bilirubin) in the morning.                           - Watch for pancreatitis, bleeding, perforation,                            and cholangitis.                           - Await cytology results and await path results.                           - Initiate Protonix  40 mg once daily.                           - Hold heparin  VTE prophylaxis for at least 24                            hours to decrease risk of post interventional                            bleeding.                           - Minimize NSAIDs as able for next 1 week.                           - Recommend patient have CT chest for completion of                            potential staging of what appears to be a malignant                            [place] tumor.                            - Referral to oncology should be placed.                           - Patient will need a KUB 2-view in 10-14 days to                            ensure pancreatic stent has migrated successfully.                            If still present at that time will need to be                            scheduled for EGD with stent pull.                           - The  findings and recommendations were discussed                            with the patient.                           - The findings and recommendations were discussed                            with the referring physician. Procedure Code(s):        --- Professional ---                           [phone], Endoscopic retrograde                            cholangiopancreatography (ERCP); with placement of                            endoscopic stent into biliary or pancreatic duct,                            including pre- and post-dilation and guide wire                            passage, when performed, including sphincterotomy,                            when performed, each stent                           43274, 59, Endoscopic retrograde                            cholangiopancreatography (ERCP); with placement of                            endoscopic stent into biliary or pancreatic duct,                            including pre- and post-dilation and guide wire                            passage, when performed, including sphincterotomy,                            when performed, each stent                           43274, 59, Endoscopic retrograde                            cholangiopancreatography (ERCP); with placement of                            endoscopic stent into biliary or pancreatic duct,  including pre- and post-dilation and guide wire                            passage, when performed, including sphincterotomy,                            when performed, each stent                            43264, Endoscopic retrograde                            cholangiopancreatography (ERCP); with removal of                            calculi/debris from biliary/pancreatic duct(s)                           43262, 59, Endoscopic retrograde                            cholangiopancreatography (ERCP); with                            sphincterotomy/papillotomy                           74328, 26, Endoscopic catheterization of the                            biliary ductal system, radiological supervision and                            interpretation Diagnosis Code(s):        --- Professional ---                           K29.70, Gastritis, unspecified, without bleeding                           K83.1, Obstruction of bile duct                           R17, Unspecified jaundice                           R74.8, Abnormal levels of other serum enzymes                           R93.2, Abnormal findings on diagnostic imaging of                            liver and biliary tract CPT copyright 2022 [employer]. All rights reserved. The codes documented in this report are preliminary and upon coder review may  be revised to meet current compliance requirements. Yong Henle, MD 05/06/2023 2:39:12 PM Number of Addenda: 0

## 2023-05-06 NOTE — Telephone Encounter (Signed)
 Recall ERCP has been entered  KUB has been entered

## 2023-05-06 NOTE — Anesthesia Preprocedure Evaluation (Addendum)
 Anesthesia Evaluation  Patient identified by MRN, date of birth, ID band Patient awake    Reviewed: Allergy & Precautions, H&P , NPO status , Patient's Chart, lab work & pertinent test results  Airway Mallampati: II  TM Distance: >3 FB Neck ROM: Full    Dental no notable dental hx.    Pulmonary COPD, Current Smoker and Patient abstained from smoking.   Pulmonary exam normal breath sounds clear to auscultation       Cardiovascular hypertension, Normal cardiovascular exam Rhythm:Regular Rate:Normal     Neuro/Psych  Neuromuscular disease  negative psych ROS   GI/Hepatic Neg liver ROS,,,biliary obstruction   Endo/Other  negative endocrine ROS    Renal/GU negative Renal ROS   Malignant neoplasm of prostate    Musculoskeletal  (+) Arthritis ,    Abdominal   Peds negative pediatric ROS (+)  Hematology negative hematology ROS (+)   Anesthesia Other Findings   Reproductive/Obstetrics negative OB ROS                             Anesthesia Physical Anesthesia Plan  ASA: 3  Anesthesia Plan: General   Post-op Pain Management:    Induction: Intravenous  PONV Risk Score and Plan: 2 and Ondansetron  and Dexamethasone   Airway Management Planned: Oral ETT  Additional Equipment: None  Intra-op Plan:   Post-operative Plan: Extubation in OR  Informed Consent: I have reviewed the patients History and Physical, chart, labs and discussed the procedure including the risks, benefits and alternatives for the proposed anesthesia with the patient or authorized representative who has indicated his/her understanding and acceptance.     Dental advisory given  Plan Discussed with: CRNA  Anesthesia Plan Comments:         Anesthesia Quick Evaluation

## 2023-05-07 DIAGNOSIS — J449 Chronic obstructive pulmonary disease, unspecified: Secondary | ICD-10-CM | POA: Diagnosis not present

## 2023-05-07 DIAGNOSIS — K831 Obstruction of bile duct: Secondary | ICD-10-CM | POA: Diagnosis not present

## 2023-05-07 DIAGNOSIS — K2960 Other gastritis without bleeding: Secondary | ICD-10-CM | POA: Diagnosis not present

## 2023-05-07 DIAGNOSIS — I10 Essential (primary) hypertension: Secondary | ICD-10-CM | POA: Diagnosis not present

## 2023-05-07 LAB — COMPREHENSIVE METABOLIC PANEL WITH GFR
ALT: 186 U/L — ABNORMAL HIGH (ref 0–44)
AST: 127 U/L — ABNORMAL HIGH (ref 15–41)
Albumin: 2.5 g/dL — ABNORMAL LOW (ref 3.5–5.0)
Alkaline Phosphatase: 281 U/L — ABNORMAL HIGH (ref 38–126)
Anion gap: 10 (ref 5–15)
BUN: 13 mg/dL (ref 8–23)
CO2: 26 mmol/L (ref 22–32)
Calcium: 9.2 mg/dL (ref 8.9–10.3)
Chloride: 98 mmol/L (ref 98–111)
Creatinine, Ser: 0.74 mg/dL (ref 0.61–1.24)
GFR, Estimated: >60 mL/min (ref >60)
Glucose, Bld: 122 mg/dL — ABNORMAL HIGH (ref 70–99)
Potassium: 4 mmol/L (ref 3.5–5.1)
Sodium: 134 mmol/L — ABNORMAL LOW (ref 135–145)
Total Bilirubin: 13.7 mg/dL — ABNORMAL HIGH (ref 0.0–1.2)
Total Protein: 6.3 g/dL — ABNORMAL LOW (ref 6.5–8.1)

## 2023-05-07 LAB — CBC
HCT: 37.3 % — ABNORMAL LOW (ref 39.0–52.0)
Hemoglobin: 13.1 g/dL (ref 13.0–17.0)
MCH: 33 pg (ref 26.0–34.0)
MCHC: 35.1 g/dL (ref 30.0–36.0)
MCV: 94 fL (ref 80.0–100.0)
Platelets: 219 10*3/uL (ref 150–400)
RBC: 3.97 MIL/uL — ABNORMAL LOW (ref 4.22–5.81)
RDW: 17.5 % — ABNORMAL HIGH (ref 11.5–15.5)
WBC: 13.6 10*3/uL — ABNORMAL HIGH (ref 4.0–10.5)
nRBC: 0 % (ref 0.0–0.2)

## 2023-05-07 LAB — LIPASE, BLOOD: Lipase: 24 U/L (ref 11–51)

## 2023-05-07 MED ORDER — PANTOPRAZOLE SODIUM 40 MG PO TBEC: 40.0000 mg | DELAYED_RELEASE_TABLET | Freq: Every day | ORAL | 0 refills | Status: DC

## 2023-05-07 MED ORDER — CIPROFLOXACIN HCL 500 MG PO TABS: 500.0000 mg | ORAL_TABLET | Freq: Two times a day (BID) | ORAL | 0 refills | Status: AC

## 2023-05-07 NOTE — Plan of Care (Signed)

## 2023-05-07 NOTE — Discharge Summary (Signed)
 Physician Discharge Summary  Robert [name] ZOX:[id number] DOB: [birth year] DOA: 05/02/2023  PCP: [facility]  Admit date: 05/02/2023 Discharge date: 05/07/23  Admitted From: Home Disposition: Home Recommendations for Outpatient Follow-up:  Follow up with PCP in 1 to 2 weeks GI to arrange outpatient follow-up Oncology to arrange outpatient follow-up.  Dr. Arno Bibles notified Needs to two view KUB in 10 to 14 days for pancreatic stent. Check CBC and CMP at follow-up. Please follow up on the following pending results: Biopsy from ERCP  Home Health: No need identified Equipment/Devices: No need identified  Discharge Condition: Stable CODE STATUS: Full code  Follow-up Information     [facility] Schedule an appointment as soon as possible for a visit in 1 week(s).   Contact information: [address] [place] [address] [phone]                 Hospital course 68 y.o. M with PMH of COPD, prostate cancer, HTN, HLD and obesity sent to ED by PCP after he presented there with jaundice, diarrhea, itching, vomiting and dark urine and admitted with obstructive jaundice/hyperbilirubinemia.  T. bili 17.2.  AST 206.  ALT 204.  ALP 301.  GI consulted.  CT abdomen and pelvis showed moderate intrahepatic biliary dilation with questionable enhancing porta hepatis mass.  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  Patient underwent ERCP showed gastritis (biopsied), malignant appearing single moderate biliary stricture (brushed for cytology) and plastic biliary stents in pancreatic, left and right hepatic ducts.  CT chest without significant finding.  GI recommended Cipro  for 5 days, PPI and referral to oncology.   On the day of discharge, bilirubin and LFT improved and he felt well and ready to go home.  See individual problem list below for  more.   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  CT, MRCP and ERCP as above.  Bilirubin and LFT improved. -Advised to hold statin for about a week -Discontinued Wegovy  -Cipro  for 4 more days-total of 5 days -GI and oncology to arrange outpatient follow-up -Check CBC and CMP at follow-up -Follow-up pathologies  Gastritis - P.o. Protonix  40 mg daily per GI  COPD stable - Continue home meds  Essential hypertension - Continue home meds  Hyperlipidemia -Hold statin at least for a week   Smoking -Encouraged cessation -Continue nicotine  patch   Hyponatremia: Mild   Class I obesity Body mass index is 32.46 kg/m.          Time spent 35 minutes  Vital signs Vitals:   05/06/23 2002 05/07/23 0549 05/07/23 0746 05/07/23 0839  BP: (!) 110/59 (!) 104/55  116/61  Pulse: (!) 58 (!) 56  (!) 50  Temp: 98.1 F (36.7 C) 98.3 F (36.8 C)  98.3 F (36.8 C)  Resp: 20 20  20   Height:      Weight:      SpO2: 98% 98% 100% 98%  TempSrc: Oral Oral    BMI (Calculated):         Discharge exam  GENERAL: No apparent distress.  Nontoxic. HEENT: MMM.  Sclerae icteric NECK: Supple.  No apparent JVD.  RESP:  No IWOB.  Fair aeration bilaterally. CVS:  RRR. Heart sounds normal.  ABD/GI/GU: BS+. Abd soft, NTND.  MSK/EXT:  Moves extremities. No apparent deformity. No edema.  SKIN: Skin jaundice NEURO: Awake and alert.  Oriented appropriately.  No apparent focal neuro deficit. PSYCH: Calm. Normal affect.   Discharge Instructions Discharge Instructions     Ambulatory referral to Hematology / Oncology   Complete by: As directed    Diet - low sodium heart healthy   Complete by: As directed    Discharge instructions   Complete by: As directed    It has been a pleasure taking care of you!  You were hospitalized with jaundice due to biliary obstruction for which you had ERCP and stent  placement.  There is concern about possible biliary cancer but your pathology is still pending.  We have sent referral to oncology.  They will follow-up on the result and arrange outpatient follow-up.  Follow-up with your primary care doctor in 1 to 2 weeks or sooner if needed.  Follow-up with gastroenterologist per their recommendation.   Take care,   Increase activity slowly   Complete by: As directed       Allergies as of 05/07/2023       Reactions   Codeine Itching   Isovue  [iopamidol ] Hives   Pt. Developed 1 hive after injection; Dr. Gaines Joy spoke with patient;recommends 13 hr prep in the future        Medication List     PAUSE taking these medications    atorvastatin  20 MG tablet Wait to take this until: May 14, 2023 Commonly known as: LIPITOR TAKE 1 TABLET BY MOUTH DAILY FOR CHOLESTEROL       STOP taking these medications    budesonide -formoterol  160-4.5 MCG/ACT inhaler Commonly known as: Symbicort    Wegovy  1.7 MG/0.75ML Soaj Generic drug: Semaglutide -Weight Management       TAKE these medications    albuterol  108 (90 Base) MCG/ACT inhaler Commonly known as: VENTOLIN  HFA Inhale 2 puffs into the lungs every 6 (six) hours as needed for wheezing or shortness of breath.   aspirin  EC 81 MG tablet Take 81 mg by mouth daily.   benazepril -hydrochlorthiazide 20-25 MG tablet Commonly known as: LOTENSIN  HCT Take 1 tablet Daily for BP                                               /                                                                   TAKE                                         BY                                                 MOUTH   Breztri  Aerosphere 160-9-4.8 MCG/ACT Aero inhaler Generic drug: budeson-glycopyrrolate -formoterol  Inhale 2 puffs into the lungs 2 (two) times daily.   ciprofloxacin  500 MG tablet Commonly known as: CIPRO  Take 1 tablet (500 mg total) by mouth 2 (two) times daily for  4 days.   Magnesium  500 MG Tabs Take 500 mg by mouth  daily.   pantoprazole  40 MG tablet Commonly known as: PROTONIX  Take 1 tablet (40 mg total) by mouth daily.   Vitamin D  125 MCG (5000 UT) Caps Take 5,000 Units by mouth daily.        Consultations: Gastroenterology  Procedures/Studies: ERCP as above.  CT CHEST W CONTRAST Result Date: 05/06/2023 CLINICAL DATA:  [place] tumor, for staging EXAM: CT CHEST WITH CONTRAST TECHNIQUE: Multidetector CT imaging of the chest was performed during intravenous contrast administration. RADIATION DOSE REDUCTION: This exam was performed according to the departmental dose-optimization program which includes automated exposure control, adjustment of the mA and/or kV according to patient size and/or use of iterative reconstruction technique. CONTRAST:  50mL OMNIPAQUE  IOHEXOL  350 MG/ML SOLN COMPARISON:  Partial comparison to MRI abdomen dated May 03, 2023 and CT abdomen/pelvis dated May 02, 2023 FINDINGS: Cardiovascular: Heart is normal in size.  No pericardial effusion. No evidence of thoracic aortic aneurysm. Atherosclerotic calcifications of the aortic arch. Mild three-vessel coronary atherosclerosis. Mediastinum/Nodes: No suspicious mediastinal lymphadenopathy. Visualized thyroid  is unremarkable. Lungs/Pleura: Evaluation lung parenchyma is constrained by respiratory motion. Within that constraint, there are no suspicious pulmonary nodules. Very mild centrilobular emphysematous changes in the upper lobes. Platelike scarring/atelectasis in the lingula and anterior left lower lobe. No focal consolidation. No pleural effusion or pneumothorax. Upper Abdomen: Better evaluated on recent CT/MR. Indwelling biliary stents with associated pneumobilia. Musculoskeletal: Degenerative changes of the visualized thoracolumbar spine. IMPRESSION: No evidence of metastatic disease in the chest. Aortic Atherosclerosis (ICD10-I70.0) and Emphysema (ICD10-J43.9). Electronically Signed   By: Zadie Herter M.D.   On: 05/06/2023  21:27   DG C-Arm 1-60 Min Result Date: 05/06/2023 CLINICAL DATA:  ERCP EXAM: DG C-ARM 1-60 MIN CONTRAST:  Please see intraoperative nodes FLUOROSCOPY: Fluoroscopy Time:  10 minutes and 30 seconds Radiation Exposure Index (if provided by the fluoroscopic device): 247.69 mGy Number of Acquired Spot Images: 13 COMPARISON:  MRI 05/03/2023 FINDINGS: Endoscope in place. Placement of wires and then biliary stents along the central biliary tree. Imaging was obtained to aid in treatment. Please correlate with real-time fluoroscopy. IMPRESSION: Intraoperative fluoroscopy for ERCP Electronically Signed   By: Adrianna Horde M.D.   On: 05/06/2023 17:34   DG C-Arm 1-60 Min Result Date: 05/06/2023 CLINICAL DATA:  ERCP EXAM: DG C-ARM 1-60 MIN CONTRAST:  Please see intraoperative nodes FLUOROSCOPY: Fluoroscopy Time:  10 minutes and 30 seconds Radiation Exposure Index (if provided by the fluoroscopic device): 247.69 mGy Number of Acquired Spot Images: 13 COMPARISON:  MRI 05/03/2023 FINDINGS: Endoscope in place. Placement of wires and then biliary stents along the central biliary tree. Imaging was obtained to aid in treatment. Please correlate with real-time fluoroscopy. IMPRESSION: Intraoperative fluoroscopy for ERCP Electronically Signed   By: Adrianna Horde M.D.   On: 05/06/2023 17:34   DG ERCP Result Date: 05/06/2023 CLINICAL DATA:  Jaundice and biliary dilatation. EXAM: ERCP TECHNIQUE: Multiple spot images obtained with the fluoroscopic device and submitted for interpretation post-procedure. FLUOROSCOPY: Radiation Exposure Index (as provided by the fluoroscopic device): 247.69 mGy COMPARISON:  CT 05/02/2023 and MRI 05/03/2023 FINDINGS: Placement of a pancreatic stent. Retrograde cholangiogram demonstrates dilated intrahepatic bile ducts. Placement of 2 biliary stents extending into the central intrahepatic bile ducts. IMPRESSION: 1. ERCP with placement of biliary and pancreatic stents. 2. These images were submitted for  radiologic interpretation only. Please refer to the ERCP procedure report. Electronically Signed   By: Gregary Lean.D.  On: 05/06/2023 17:28   MR ABDOMEN MRCP W WO CONTAST Result Date: 05/03/2023 CLINICAL DATA:  Jaundice. History of prostate cancer with prostatectomy. EXAM: MRI ABDOMEN WITHOUT AND WITH CONTRAST (INCLUDING MRCP) TECHNIQUE: Multiplanar multisequence MR imaging of the abdomen was performed both before and after the administration of intravenous contrast. Heavily T2-weighted images of the biliary and pancreatic ducts were obtained, and three-dimensional MRCP images were rendered by post processing. CONTRAST:  10mL GADAVIST  GADOBUTROL  1 MMOL/ML IV SOLN COMPARISON:  Yesterday's CT.  CT of 03/30/2016 FINDINGS: Lower chest: Normal heart size without pericardial or pleural effusion. Hepatobiliary: No suspicious liver lesion. Moderate intrahepatic biliary duct dilatation. Gallbladder sludge without acute surrounding inflammation. Moderate intrahepatic biliary duct dilatation. This continues to the level of the porta hepatis, were amorphous soft tissue signal mass is identified including on 18/4 and subtracted image 53/11104. Estimated at 3.4 x 2.6 cm on 55/11103. The common duct distal to this mass is normal in caliber, without choledocholithiasis. Pancreas:  Normal, without mass or ductal dilatation. Spleen:  Normal in size, without focal abnormality. Adrenals/Urinary Tract: Mild left adrenal thickening. Normal right adrenal gland and kidneys. Stomach/Bowel: Normal stomach and abdominal bowel loops. Vascular/Lymphatic: Aortic atherosclerosis. Combined celiac and SMA. The portal vein is intimately associated with the porta hepatis mass, but remains patent. Mild porta hepatis adenopathy at 1.6 cm on 14/4 was similar in 2018 (24/2 of that exam), favoring a reactive etiology. Other: No ascites. No evidence of omental or peritoneal disease. Fat containing ventral abdominal wall hernia. Musculoskeletal: No  acute osseous abnormality. IMPRESSION: 1. Moderate intrahepatic biliary duct dilatation secondary to an amorphous 3.4 cm soft tissue signal lesion within the porta hepatis, favoring cholangiocarcinoma. 2. Mild porta hepatis adenopathy is present back to 2018, favored to be reactive. No evidence of metastatic disease. 3.  Aortic Atherosclerosis (ICD10-I70.0). 4. Fat containing ventral abdominal wall hernia. Electronically Signed   By: Lore Rode M.D.   On: 05/03/2023 10:40   MR 3D Recon At Scanner Result Date: 05/03/2023 CLINICAL DATA:  Jaundice. History of prostate cancer with prostatectomy. EXAM: MRI ABDOMEN WITHOUT AND WITH CONTRAST (INCLUDING MRCP) TECHNIQUE: Multiplanar multisequence MR imaging of the abdomen was performed both before and after the administration of intravenous contrast. Heavily T2-weighted images of the biliary and pancreatic ducts were obtained, and three-dimensional MRCP images were rendered by post processing. CONTRAST:  10mL GADAVIST  GADOBUTROL  1 MMOL/ML IV SOLN COMPARISON:  Yesterday's CT.  CT of 03/30/2016 FINDINGS: Lower chest: Normal heart size without pericardial or pleural effusion. Hepatobiliary: No suspicious liver lesion. Moderate intrahepatic biliary duct dilatation. Gallbladder sludge without acute surrounding inflammation. Moderate intrahepatic biliary duct dilatation. This continues to the level of the porta hepatis, were amorphous soft tissue signal mass is identified including on 18/4 and subtracted image 53/11104. Estimated at 3.4 x 2.6 cm on 55/11103. The common duct distal to this mass is normal in caliber, without choledocholithiasis. Pancreas:  Normal, without mass or ductal dilatation. Spleen:  Normal in size, without focal abnormality. Adrenals/Urinary Tract: Mild left adrenal thickening. Normal right adrenal gland and kidneys. Stomach/Bowel: Normal stomach and abdominal bowel loops. Vascular/Lymphatic: Aortic atherosclerosis. Combined celiac and SMA. The portal  vein is intimately associated with the porta hepatis mass, but remains patent. Mild porta hepatis adenopathy at 1.6 cm on 14/4 was similar in 2018 (24/2 of that exam), favoring a reactive etiology. Other: No ascites. No evidence of omental or peritoneal disease. Fat containing ventral abdominal wall hernia. Musculoskeletal: No acute osseous abnormality. IMPRESSION: 1. Moderate intrahepatic biliary duct  dilatation secondary to an amorphous 3.4 cm soft tissue signal lesion within the porta hepatis, favoring cholangiocarcinoma. 2. Mild porta hepatis adenopathy is present back to 2018, favored to be reactive. No evidence of metastatic disease. 3.  Aortic Atherosclerosis (ICD10-I70.0). 4. Fat containing ventral abdominal wall hernia. Electronically Signed   By: Lore Rode M.D.   On: 05/03/2023 10:40   CT ABDOMEN PELVIS W CONTRAST Result Date: 05/02/2023 CLINICAL DATA:  Adnexal mass, malignancy suspected seen at PCP in referred to ED due to increased yelling of skin and severe nausea, vomiting, diarrhea. Recently stopped Wegovy . History of prostatectomy for prostate cancer. Allergy to Isovue . Patient given Benadryl  IV pre scan. EXAM: CT ABDOMEN AND PELVIS WITH CONTRAST TECHNIQUE: Multidetector CT imaging of the abdomen and pelvis was performed using the standard protocol following bolus administration of intravenous contrast. RADIATION DOSE REDUCTION: This exam was performed according to the departmental dose-optimization program which includes automated exposure control, adjustment of the mA and/or kV according to patient size and/or use of iterative reconstruction technique. CONTRAST:  OMNIPAQUE  IOHEXOL  300 MG/ML  SOLN COMPARISON:  03/30/2016 FINDINGS: Lower chest: No acute abnormality. Hepatobiliary: Since 2018 there is new moderate intrahepatic biliary dilation. Question enhancing mass within the porta hepatis about the proximal common bile duct (series 4/image 66) measuring 3.0 x 2.2 cm. There is mass  effect on the common bile duct in this region with hyperenhancement of the wall of the common bile duct. Hyperdensity throughout the gallbladder. Hyperenhancement about the wall of the cystic duct. No focal hepatic lesion. Pancreas: Unremarkable. No pancreatic ductal dilatation or surrounding inflammatory changes. Spleen: Unremarkable. Adrenals/Urinary Tract: Normal adrenal glands. Nonobstructing left nephrolithiasis. No urinary calculi or hydronephrosis. Unremarkable bladder. Stomach/Bowel: Normal caliber large and small bowel. No bowel wall thickening. Stomach is within normal limits. Normal appendix. Vascular/Lymphatic: Aortic atherosclerosis. 12 mm left periaortic node (2/21) is not substantially changed from 03/30/2016. Reproductive: Prostatectomy. Other: No free intraperitoneal fluid or air. Fat containing periumbilical hernia. There is fat stranding within the herniated fat. Musculoskeletal: No acute fracture or destructive osseous lesion. IMPRESSION: 1. Moderate intrahepatic biliary dilation. Question enhancing mass within the porta hepatis about the proximal common bile duct. There is mass effect on the common bile duct in this region with hyperenhancement of the wall of the common bile duct. 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 Recommend further evaluation with MRI abdomen with and without contrast. 2. Hyperdensity throughout the gallbladder, likely sludge. 3. Fat containing periumbilical hernia. There is fat stranding within the herniated fat. Correlate for incarceration. 4. Nonobstructing left nephrolithiasis. 5. Aortic Atherosclerosis (ICD10-I70.0). Electronically Signed   By: Rozell Cornet M.D.   On: 05/02/2023 20:54       The results of significant diagnostics from this hospitalization (including imaging, microbiology, ancillary and laboratory) are listed below for reference.     Microbiology: Recent Results (from the past 240 hours)  Surgical PCR screen     Status: None    Collection Time: 05/04/23 11:01 AM   Specimen: Nasal Mucosa; Nasal Swab  Result Value Ref Range Status   MRSA, PCR NEGATIVE NEGATIVE Final   Staphylococcus aureus NEGATIVE NEGATIVE Final    Comment: (NOTE) The Xpert SA Assay (FDA approved for NASAL specimens in patients 45 years of age and older), is one component of a comprehensive surveillance program. It is not intended to diagnose infection nor to guide or monitor treatment. Performed at [facility], [address], [place], [place] [address]      Labs:  CBC: Recent Labs  Lab 05/03/23 0528 05/04/23 0644 05/05/23 0703 05/06/23 0708 05/07/23 0820  WBC 9.5 9.2 9.3 8.9 13.6*  HGB 14.1 14.1 14.2 13.4 13.1  HCT 39.5 40.2 40.5 37.8* 37.3*  MCV 91.9 92.2 91.6 92.9 94.0  PLT 208 223 240 219 219   BMP &GFR Recent Labs  Lab 05/03/23 0528 05/04/23 0644 05/05/23 0703 05/06/23 0708 05/07/23 0820  NA 132* 133* 134* 136 134*  K 3.4* 3.4* 3.5 4.3 4.0  CL 96* 96* 96* 101 98  CO2 25 26 27 26 26   GLUCOSE 109* 114* 112* 92 122*  BUN 10 8 9 10 13   CREATININE 0.76 0.53* 0.56* 0.58* 0.74  CALCIUM  9.1 9.4 9.4 9.3 9.2  MG 1.8  --  1.8 1.7  --    Estimated Creatinine Clearance: 99.3 mL/min (by C-G formula based on SCr of 0.74 mg/dL). Liver & Pancreas: Recent Labs  Lab 05/03/23 0528 05/04/23 0644 05/05/23 0703 05/06/23 0708 05/07/23 0820  AST 206* 227* 226* 202* 127*  ALT 204* 227* 232* 213* 186*  ALKPHOS 301* 337* 347* 301* 281*  BILITOT 17.2* 19.8* 20.8* 20.2* 13.7*  PROT 6.3* 6.7 6.6 6.0* 6.3*  ALBUMIN 2.7* 2.8* 2.7* 2.5* 2.5*   Recent Labs  Lab 05/02/23 1433 05/07/23 0820  LIPASE 16 24   No results for input(s): "AMMONIA" in the last 168 hours. Diabetic: No results for input(s): "HGBA1C" in the last 72 hours. No results for input(s): "GLUCAP" in the last 168 hours. Cardiac Enzymes: No results for input(s): "CKTOTAL", "CKMB", "CKMBINDEX", "TROPONINI" in the last 168 hours. No results for input(s):  "PROBNP" in the last 8760 hours. Coagulation Profile: Recent Labs  Lab 05/03/23 0528  INR 1.0   Thyroid  Function Tests: No results for input(s): "TSH", "T4TOTAL", "FREET4", "T3FREE", "THYROIDAB" in the last 72 hours. Lipid Profile: No results for input(s): "CHOL", "HDL", "LDLCALC", "TRIG", "CHOLHDL", "LDLDIRECT" in the last 72 hours. Anemia Panel: No results for input(s): "VITAMINB12", "FOLATE", "FERRITIN", "TIBC", "IRON", "RETICCTPCT" in the last 72 hours. Urine analysis:    Component Value Date/Time   COLORURINE YELLOW 04/06/2022 1004   APPEARANCEUR CLEAR 04/06/2022 1004   LABSPEC 1.009 04/06/2022 1004   PHURINE 6.5 04/06/2022 1004   GLUCOSEU NEGATIVE 04/06/2022 1004   HGBUR NEGATIVE 04/06/2022 1004   BILIRUBINUR NEGATIVE 01/30/2018 0630   KETONESUR NEGATIVE 04/06/2022 1004   PROTEINUR TRACE (A) 04/06/2022 1004   NITRITE NEGATIVE 04/06/2022 1004   LEUKOCYTESUR NEGATIVE 04/06/2022 1004   Sepsis Labs: Invalid input(s): "PROCALCITONIN", "LACTICIDVEN"   SIGNED:  Theadore Finger, MD  [facility] 05/07/2023, 1:54 PM

## 2023-05-07 NOTE — Progress Notes (Signed)
 Subjective: No complaints after the ERCP.  Feeling well.  Objective: Vital signs in last 24 hours: Temp:  [97.1 F (36.2 C)-98.4 F (36.9 C)] 98.3 F (36.8 C) (04/26 0839) Pulse Rate:  [50-72] 50 (04/26 0839) Resp:  [11-20] 20 (04/26 0839) BP: (104-137)/(55-71) 116/61 (04/26 0839) SpO2:  [94 %-100 %] 98 % (04/26 0839) Weight:  [94 kg] 94 kg (04/25 1043) Last BM Date : 05/05/23  Intake/Output from previous day: 04/25 0701 - 04/26 0700 In: 1200 [I.V.:1200] Out: -  Intake/Output this shift: No intake/output data recorded.  General appearance: alert and no distress GI: soft, non-tender; bowel sounds normal; no masses,  no organomegaly  Lab Results: Recent Labs    05/05/23 0703 05/06/23 0708  WBC 9.3 8.9  HGB 14.2 13.4  HCT 40.5 37.8*  PLT 240 219   BMET Recent Labs    05/05/23 0703 05/06/23 0708  NA 134* 136  K 3.5 4.3  CL 96* 101  CO2 27 26  GLUCOSE 112* 92  BUN 9 10  CREATININE 0.56* 0.58*  CALCIUM  9.4 9.3   LFT Recent Labs    05/06/23 0708  PROT 6.0*  ALBUMIN 2.5*  AST 202*  ALT 213*  ALKPHOS 301*  BILITOT 20.2*  BILIDIR 12.3*   PT/INR No results for input(s): "LABPROT", "INR" in the last 72 hours. Hepatitis Panel No results for input(s): "HEPBSAG", "HCVAB", "HEPAIGM", "HEPBIGM" in the last 72 hours. C-Diff No results for input(s): "CDIFFTOX" in the last 72 hours. Fecal Lactopherrin No results for input(s): "FECLLACTOFRN" in the last 72 hours.  Studies/Results: CT CHEST W CONTRAST Result Date: 05/06/2023 CLINICAL DATA:  Klatskin tumor, for staging EXAM: CT CHEST WITH CONTRAST TECHNIQUE: Multidetector CT imaging of the chest was performed during intravenous contrast administration. RADIATION DOSE REDUCTION: This exam was performed according to the departmental dose-optimization program which includes automated exposure control, adjustment of the mA and/or kV according to patient size and/or use of iterative reconstruction technique. CONTRAST:   50mL OMNIPAQUE  IOHEXOL  350 MG/ML SOLN COMPARISON:  Partial comparison to MRI abdomen dated May 03, 2023 and CT abdomen/pelvis dated May 02, 2023 FINDINGS: Cardiovascular: Heart is normal in size.  No pericardial effusion. No evidence of thoracic aortic aneurysm. Atherosclerotic calcifications of the aortic arch. Mild three-vessel coronary atherosclerosis. Mediastinum/Nodes: No suspicious mediastinal lymphadenopathy. Visualized thyroid  is unremarkable. Lungs/Pleura: Evaluation lung parenchyma is constrained by respiratory motion. Within that constraint, there are no suspicious pulmonary nodules. Very mild centrilobular emphysematous changes in the upper lobes. Platelike scarring/atelectasis in the lingula and anterior left lower lobe. No focal consolidation. No pleural effusion or pneumothorax. Upper Abdomen: Better evaluated on recent CT/MR. Indwelling biliary stents with associated pneumobilia. Musculoskeletal: Degenerative changes of the visualized thoracolumbar spine. IMPRESSION: No evidence of metastatic disease in the chest. Aortic Atherosclerosis (ICD10-I70.0) and Emphysema (ICD10-J43.9). Electronically Signed   By: Zadie Herter M.[name].   On: 05/06/2023 21:27   DG C-Arm 1-60 Min Result Date: 05/06/2023 CLINICAL DATA:  ERCP EXAM: DG C-ARM 1-60 MIN CONTRAST:  Please see intraoperative nodes FLUOROSCOPY: Fluoroscopy Time:  10 minutes and 30 seconds Radiation Exposure Index (if provided by the fluoroscopic device): 247.69 mGy Number of Acquired Spot Images: 13 COMPARISON:  MRI 05/03/2023 FINDINGS: Endoscope in place. Placement of wires and then biliary stents along the central biliary tree. Imaging was obtained to aid in treatment. Please correlate with real-time fluoroscopy. IMPRESSION: Intraoperative fluoroscopy for ERCP Electronically Signed   By: Adrianna Horde M.[name].   On: 05/06/2023 17:34   DG C-Arm 1-60  Min Result Date: 05/06/2023 CLINICAL DATA:  ERCP EXAM: DG C-ARM 1-60 MIN CONTRAST:  Please see  intraoperative nodes FLUOROSCOPY: Fluoroscopy Time:  10 minutes and 30 seconds Radiation Exposure Index (if provided by the fluoroscopic device): 247.69 mGy Number of Acquired Spot Images: 13 COMPARISON:  MRI 05/03/2023 FINDINGS: Endoscope in place. Placement of wires and then biliary stents along the central biliary tree. Imaging was obtained to aid in treatment. Please correlate with real-time fluoroscopy. IMPRESSION: Intraoperative fluoroscopy for ERCP Electronically Signed   By: Adrianna Horde M.[name].   On: 05/06/2023 17:34   DG ERCP Result Date: 05/06/2023 CLINICAL DATA:  Jaundice and biliary dilatation. EXAM: ERCP TECHNIQUE: Multiple spot images obtained with the fluoroscopic device and submitted for interpretation post-procedure. FLUOROSCOPY: Radiation Exposure Index (as provided by the fluoroscopic device): 247.69 mGy COMPARISON:  CT 05/02/2023 and MRI 05/03/2023 FINDINGS: Placement of a pancreatic stent. Retrograde cholangiogram demonstrates dilated intrahepatic bile ducts. Placement of 2 biliary stents extending into the central intrahepatic bile ducts. IMPRESSION: 1. ERCP with placement of biliary and pancreatic stents. 2. These images were submitted for radiologic interpretation only. Please refer to the ERCP procedure report. Electronically Signed   By: Elene Griffes M.[name].   On: 05/06/2023 17:28    Medications: Scheduled:  benazepril   20 mg Oral Daily   budeson-glycopyrrolate -formoterol   2 puff Inhalation BID   ciprofloxacin   500 mg Oral BID   mupirocin  ointment  1 Application Nasal BID   nicotine   21 mg Transdermal Daily   pantoprazole   40 mg Oral Daily   pneumococcal 20-valent conjugate vaccine  0.5 mL Intramuscular Tomorrow-1000   Continuous:  Assessment/Plan: 1) Klatskin's tumor. 2) Biliary obstruction.   The patient is stable and he does not have any sequelae from the ERCP yesterday.  The liver panel for this AM is pending.    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 2) Signing off.  Call with any questions.  LOS: 4 days   Robert [name] 05/07/2023, 8:47 AM

## 2023-05-07 NOTE — Progress Notes (Signed)
 Patient D/c in, waiting to hear back from GI.

## 2023-05-08 ENCOUNTER — Encounter (HOSPITAL_COMMUNITY): Payer: Self-pay | Admitting: Gastroenterology

## 2023-05-09 DIAGNOSIS — K831 Obstruction of bile duct: Secondary | ICD-10-CM | POA: Diagnosis not present

## 2023-05-09 DIAGNOSIS — C221 Intrahepatic bile duct carcinoma: Secondary | ICD-10-CM | POA: Diagnosis not present

## 2023-05-09 DIAGNOSIS — Z09 Encounter for follow-up examination after completed treatment for conditions other than malignant neoplasm: Secondary | ICD-10-CM | POA: Diagnosis not present

## 2023-05-09 DIAGNOSIS — Z9689 Presence of other specified functional implants: Secondary | ICD-10-CM | POA: Diagnosis not present

## 2023-05-09 LAB — SURGICAL PATHOLOGY

## 2023-05-09 LAB — CYTOLOGY - NON PAP

## 2023-05-10 ENCOUNTER — Encounter: Payer: Self-pay | Admitting: *Deleted

## 2023-05-10 ENCOUNTER — Other Ambulatory Visit: Payer: Self-pay

## 2023-05-10 ENCOUNTER — Encounter: Payer: Self-pay | Admitting: Gastroenterology

## 2023-05-10 DIAGNOSIS — C221 Intrahepatic bile duct carcinoma: Secondary | ICD-10-CM

## 2023-05-10 NOTE — Progress Notes (Signed)
 PATIENT NAVIGATOR PROGRESS NOTE  Name: Robert [name] Date: 05/10/2023 MRN: [id number]  DOB: 01-Dec-1955   Reason for visit:  New Pt appt  Comments:  Called and spoke with [name] and have him scheduled for New pt appt with Dr Scherrie Curt on 05/13/23 at 1:40pm  Discussed directions to building and parking as well as what to expect with appt    Time spent counseling/coordinating care: 30-45 minutes

## 2023-05-13 ENCOUNTER — Inpatient Hospital Stay: Attending: Oncology | Admitting: Oncology

## 2023-05-13 VITALS — BP 131/58 | HR 71 | Temp 98.2°F | Resp 18 | Ht 67.0 in | Wt 224.9 lb

## 2023-05-13 DIAGNOSIS — F17210 Nicotine dependence, cigarettes, uncomplicated: Secondary | ICD-10-CM | POA: Insufficient documentation

## 2023-05-13 DIAGNOSIS — C221 Intrahepatic bile duct carcinoma: Secondary | ICD-10-CM | POA: Insufficient documentation

## 2023-05-13 DIAGNOSIS — C240 Malignant neoplasm of extrahepatic bile duct: Secondary | ICD-10-CM | POA: Insufficient documentation

## 2023-05-13 DIAGNOSIS — Z9689 Presence of other specified functional implants: Secondary | ICD-10-CM | POA: Diagnosis not present

## 2023-05-13 DIAGNOSIS — Z860101 Personal history of adenomatous and serrated colon polyps: Secondary | ICD-10-CM | POA: Insufficient documentation

## 2023-05-13 DIAGNOSIS — Z8546 Personal history of malignant neoplasm of prostate: Secondary | ICD-10-CM | POA: Insufficient documentation

## 2023-05-13 DIAGNOSIS — K7689 Other specified diseases of liver: Secondary | ICD-10-CM | POA: Diagnosis not present

## 2023-05-13 DIAGNOSIS — C61 Malignant neoplasm of prostate: Secondary | ICD-10-CM | POA: Insufficient documentation

## 2023-05-13 DIAGNOSIS — K831 Obstruction of bile duct: Secondary | ICD-10-CM | POA: Diagnosis not present

## 2023-05-13 DIAGNOSIS — R634 Abnormal weight loss: Secondary | ICD-10-CM | POA: Insufficient documentation

## 2023-05-13 NOTE — Progress Notes (Signed)
 [facility] New Patient Consult   Requesting MD: [facility] [address] [place],  [place] [address]   Julyen Nyarko 68 y.o.  09/03/1955    Reason for Consult: Cholangiocarcinoma   HPI: Mr. Szafran reports the onset of nausea, vomiting, and diarrhea during the week of 04/25/2023.  He developed clay colored stool and jaundice.  He presented to his primary provider and was referred to the emergency room on 05/02/2023.  A chemistry panel found the liver enzymes and bilirubin to be elevated.  He was admitted for further evaluation.  A CT abdomen/pelvis 05/02/2023 revealed new intrapelvic biliary dilation with a possible mass in the porta hepatis with mass effect on the common duct.  A 12 mm left periotic node is unchanged compared to 03/30/2016.  An MRI/MRCP on 05/03/2023 revealed moderate intrahepatic duct dilation and no suspicious liver lesion.  An amorphous soft tissue mass was noted at the porta hepatis.  He was taken to an ERCP by Dr. Brice Campi on 05/06/2023.  A single stenosis was noted with dilation of the left and right intrahepatic ducts.  A brushing was obtained from the hepatic bifurcation.  Left and right hepatic duct stents were placed. The cytology returned with malignant cells-adenocarcinoma.  Mr. Chesmore reports resolution of the nausea and diarrhea.  Jaundice is improving.  He has been referred to the surgical service at [facility].  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  Prostate cancer, Gleason 7, pT3a,pN0                                                                       06/28/2016   .  Nonmelanoma skin cancer  Past Surgical History:  Procedure Laterality Date   BILIARY BRUSHING   05/06/2023   Procedure: BRUSH BIOPSY, BILE DUCT;  Surgeon: Brice Campi, Albino Alu., MD;  Location: [facility] ENDOSCOPY;  Service: Gastroenterology;;   BILIARY STENT PLACEMENT  05/06/2023   Procedure: INSERTION, STENT, BILE DUCT;  Surgeon: Normie Becton., MD;  Location: [facility] ENDOSCOPY;  Service: Gastroenterology;;   BIOPSY OF SKIN SUBCUTANEOUS TISSUE AND/OR MUCOUS MEMBRANE  05/06/2023   Procedure: BIOPSY, SKIN, SUBCUTANEOUS TISSUE, OR MUCOUS MEMBRANE;  Surgeon: Normie Becton., MD;  Location: [facility] ENDOSCOPY;  Service: Gastroenterology;;   COLONOSCOPY  2009   CG  hems, TAs   ERCP N/A 05/06/2023   Procedure: ERCP, WITH INTERVENTION IF INDICATED;  Surgeon: Normie Becton., MD;  Location: [facility] ENDOSCOPY;  Service: Gastroenterology;  Laterality: N/A;   LYMPHADENECTOMY N/A 06/28/2016   Procedure: LYMPHADENECTOMY;  Surgeon: Florencio Hunting, MD;  Location: [facility] ORS;  Service: Urology;  Laterality: N/A;   PANCREATIC STENT PLACEMENT  05/06/2023   Procedure: INSERTION, STENT, PANCREATIC DUCT;  Surgeon: Brice Campi Albino Alu., MD;  Location: [facility] ENDOSCOPY;  Service: Gastroenterology;;   PROSTATE BIOPSY  03/16/2016   Surgical Pathology Gross and Microscopic Examination for Prostate Needle   ROBOT ASSISTED LAPAROSCOPIC RADICAL PROSTATECTOMY N/A 06/28/2016  Procedure: XI ROBOTIC ASSISTED LAPAROSCOPIC RADICAL PROSTATECTOMY LEVEL 2 EXCISION OF PENILE LESION;  Surgeon: Florencio Hunting, MD;  Location: [facility] ORS;  Service: Urology;  Laterality: N/A;    Medications: Reviewed  Allergies:  Allergies  Allergen Reactions   Codeine Itching   Isovue  [Iopamidol ] Hives    Pt. Developed 1 hive after injection; Dr. Gaines Joy spoke with patient;recommends 13 hr prep in the future    Family history: No family history of cancer  Social History:   He lives with his niece in [place].  He works as a Chartered certified accountant.  He smokes 1/2 pack of cigarettes per day.  Rare alcohol use.  No transfusion history.  No risk factor for HIV  or hepatitis  ROS:   Positives include: Intentional weight loss over the past 6-8 months after starting Wegovy  (discontinued April 2025), nausea/vomiting and diarrhea beginning week of 04/25/2023  A complete ROS was otherwise negative.  Physical Exam:  Blood pressure (!) 131/58, pulse 71, temperature 98.2 F (36.8 C), temperature source Temporal, resp. rate 18, height 5\' 7"  (1.702 m), weight 224 lb 14.4 oz (102 kg), SpO2 96%.  HEENT: Oropharynx without visible mass, neck without mass Lungs: Distant breath sounds, clear bilaterally, no respiratory distress Cardiac: Regular rate and rhythm Abdomen: No hepatosplenomegaly, no mass, nontender  Vascular: No leg edema, chronic stasis change at the lower leg bilaterally Lymph nodes: No cervical, supraclavicular, axillary, or inguinal nodes Neurologic: Alert and oriented, the motor exam appears intact in the upper and lower extremities bilaterally Skin: No rash, brown discoloration at the lower leg bilaterally Musculoskeletal: No spine tenderness   LAB:  CBC  Lab Results  Component Value Date   WBC 13.6 (H) 05/07/2023   HGB 13.1 05/07/2023   HCT 37.3 (L) 05/07/2023   MCV 94.0 05/07/2023   PLT 219 05/07/2023   NEUTROABS 6,787 01/18/2023        CMP  Lab Results  Component Value Date   NA 134 (L) 05/07/2023   K 4.0 05/07/2023   CL 98 05/07/2023   CO2 26 05/07/2023   GLUCOSE 122 (H) 05/07/2023   BUN 13 05/07/2023   CREATININE 0.74 05/07/2023   CALCIUM  9.2 05/07/2023   PROT 6.3 (L) 05/07/2023   ALBUMIN 2.5 (L) 05/07/2023   AST 127 (H) 05/07/2023   ALT 186 (H) 05/07/2023   ALKPHOS 281 (H) 05/07/2023   BILITOT 13.7 (H) 05/07/2023   GFRNONAA >60 05/07/2023   GFRAA 117 03/21/2020     Lab Results  Component Value Date   CEA1 4.8 (H) 05/03/2023   EAV409 2,144 (H) 05/03/2023    Imaging: As per HPI, CT and MRI images were reviewed with Mr. 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 Kozik was admitted with obstructive jaundice 05/02/2023.  He  has been diagnosed with a Klatskin's tumor (Bismuth IV).  There is no clinical or radiologic evidence of distant metastatic disease.  He underwent placement of right and left hepatic duct stents on 05/06/2023.  I discussed treatment of cholangiocarcinomas with Mr. Bourgeois and his family.  He understands surgery is the only potentially curative therapy.  He will be referred to the hepatobiliary surgical service at [facility] to consider the indication for surgical resection. He will return for an office visit after surgery to discuss adjuvant treatment options.  We will see him sooner if the tumor is deemed unresectable or he is not a surgical candidate.  He will call for a fever or chills.  He will continue follow-up with his primary provider and Dr. Deirdre Fate to be sure the bilirubin and liver enzymes continue to improve.  Coni Deep, MD  05/13/2023, 2:14 PM

## 2023-05-14 LAB — LAB REPORT - SCANNED: EGFR (Non-African Amer.): 105

## 2023-05-18 DIAGNOSIS — J189 Pneumonia, unspecified organism: Secondary | ICD-10-CM | POA: Diagnosis not present

## 2023-05-18 DIAGNOSIS — A419 Sepsis, unspecified organism: Secondary | ICD-10-CM | POA: Diagnosis not present

## 2023-05-18 DIAGNOSIS — J441 Chronic obstructive pulmonary disease with (acute) exacerbation: Secondary | ICD-10-CM | POA: Diagnosis not present

## 2023-05-20 ENCOUNTER — Encounter: Payer: Self-pay | Admitting: Gastroenterology

## 2023-05-20 NOTE — Progress Notes (Unsigned)
 Outside laboratories from 05/13/2023  AST/ALT 54/83 Alk phos 193 Total bili 5.5 Sodium 137 Potassium 4.0 BUN/creatinine 15/0.62 Hemoglobin 11.3 Hematocrit 34.7 WBC 9.6 Platelet 253 MCV 99.7  We will get the laboratory scanned in the chart.

## 2023-05-20 NOTE — Telephone Encounter (Signed)
 Spoke with the patient and agrees to the change of location for cholangiocarcinoma for consideration of surgical management. He prefers a [place] location but will go to either one. Prefers an afternoon if possible. Referral faxed to [facility] location [phone] to [facility] GI Oncology referrals department.

## 2023-05-25 ENCOUNTER — Ambulatory Visit (INDEPENDENT_AMBULATORY_CARE_PROVIDER_SITE_OTHER)
Admission: RE | Admit: 2023-05-25 | Discharge: 2023-05-25 | Disposition: A | Discharge status: Home / Self Care | Source: Ambulatory Visit | Attending: Gastroenterology | Admitting: Gastroenterology

## 2023-05-25 DIAGNOSIS — Z4682 Encounter for fitting and adjustment of non-vascular catheter: Secondary | ICD-10-CM | POA: Diagnosis not present

## 2023-05-25 DIAGNOSIS — Z48815 Encounter for surgical aftercare following surgery on the digestive system: Secondary | ICD-10-CM

## 2023-05-25 DIAGNOSIS — T85528A Displacement of other gastrointestinal prosthetic devices, implants and grafts, initial encounter: Secondary | ICD-10-CM

## 2023-05-27 DIAGNOSIS — K838 Other specified diseases of biliary tract: Secondary | ICD-10-CM | POA: Diagnosis not present

## 2023-05-27 DIAGNOSIS — C221 Intrahepatic bile duct carcinoma: Secondary | ICD-10-CM | POA: Diagnosis not present

## 2023-05-29 LAB — COMPREHENSIVE METABOLIC PANEL WITH GFR
ALT: 213 U/L — ABNORMAL HIGH (ref 0–44)
AST: 202 U/L — ABNORMAL HIGH (ref 15–41)
Albumin: 2.5 g/dL — ABNORMAL LOW (ref 3.5–5.0)
Alkaline Phosphatase: 301 U/L — ABNORMAL HIGH (ref 38–126)
Anion gap: 9 (ref 5–15)
BUN: 10 mg/dL (ref 8–23)
CO2: 26 mmol/L (ref 22–32)
Calcium: 9.3 mg/dL (ref 8.9–10.3)
Chloride: 101 mmol/L (ref 98–111)
Creatinine, Ser: 0.58 mg/dL — ABNORMAL LOW (ref 0.61–1.24)
GFR, Estimated: >60 mL/min (ref >60)
Glucose, Bld: 92 mg/dL (ref 70–99)
Potassium: 4.3 mmol/L (ref 3.5–5.1)
Sodium: 136 mmol/L (ref 135–145)
Total Bilirubin: 20.2 mg/dL (ref 0.0–1.2)
Total Protein: 6 g/dL — ABNORMAL LOW (ref 6.5–8.1)

## 2023-05-30 ENCOUNTER — Ambulatory Visit: Payer: Self-pay | Admitting: Gastroenterology

## 2023-05-30 ENCOUNTER — Encounter: Payer: Self-pay | Admitting: *Deleted

## 2023-05-30 DIAGNOSIS — Z9689 Presence of other specified functional implants: Secondary | ICD-10-CM | POA: Diagnosis not present

## 2023-05-30 DIAGNOSIS — T85528A Displacement of other gastrointestinal prosthetic devices, implants and grafts, initial encounter: Secondary | ICD-10-CM

## 2023-05-30 DIAGNOSIS — C221 Intrahepatic bile duct carcinoma: Secondary | ICD-10-CM | POA: Diagnosis not present

## 2023-05-30 DIAGNOSIS — K831 Obstruction of bile duct: Secondary | ICD-10-CM | POA: Diagnosis not present

## 2023-05-30 NOTE — Progress Notes (Signed)
 [facility] testing requested on accession number [id number]

## 2023-05-31 ENCOUNTER — Other Ambulatory Visit: Payer: Self-pay

## 2023-05-31 DIAGNOSIS — K831 Obstruction of bile duct: Secondary | ICD-10-CM | POA: Diagnosis not present

## 2023-05-31 DIAGNOSIS — C221 Intrahepatic bile duct carcinoma: Secondary | ICD-10-CM | POA: Diagnosis not present

## 2023-05-31 DIAGNOSIS — Z9889 Other specified postprocedural states: Secondary | ICD-10-CM | POA: Diagnosis not present

## 2023-05-31 DIAGNOSIS — T85528A Displacement of other gastrointestinal prosthetic devices, implants and grafts, initial encounter: Secondary | ICD-10-CM

## 2023-05-31 LAB — LAB REPORT - SCANNED: EGFR: 117

## 2023-05-31 NOTE — Telephone Encounter (Signed)
 The pt has been advised and set up for EGD on 06/14/23 at 10 am in the [facility].  He has been instructed and all information has been sent to My Chart and mailed to the home address.   Xray order has been entered and pt will complete 1 week prior to EGD

## 2023-06-01 ENCOUNTER — Other Ambulatory Visit: Payer: Self-pay | Admitting: *Deleted

## 2023-06-01 ENCOUNTER — Encounter: Payer: Self-pay | Admitting: Gastroenterology

## 2023-06-01 ENCOUNTER — Inpatient Hospital Stay: Admitting: Oncology

## 2023-06-01 VITALS — BP 137/66 | HR 69 | Temp 98.1°F | Resp 18 | Ht 67.0 in | Wt 216.4 lb

## 2023-06-01 DIAGNOSIS — Z8546 Personal history of malignant neoplasm of prostate: Secondary | ICD-10-CM | POA: Diagnosis not present

## 2023-06-01 DIAGNOSIS — F17210 Nicotine dependence, cigarettes, uncomplicated: Secondary | ICD-10-CM | POA: Diagnosis not present

## 2023-06-01 DIAGNOSIS — C240 Malignant neoplasm of extrahepatic bile duct: Secondary | ICD-10-CM

## 2023-06-01 DIAGNOSIS — Z860101 Personal history of adenomatous and serrated colon polyps: Secondary | ICD-10-CM | POA: Diagnosis not present

## 2023-06-01 DIAGNOSIS — C61 Malignant neoplasm of prostate: Secondary | ICD-10-CM | POA: Diagnosis not present

## 2023-06-01 DIAGNOSIS — R634 Abnormal weight loss: Secondary | ICD-10-CM | POA: Diagnosis not present

## 2023-06-01 DIAGNOSIS — C221 Intrahepatic bile duct carcinoma: Secondary | ICD-10-CM | POA: Diagnosis not present

## 2023-06-01 NOTE — Progress Notes (Signed)
 Robert [name] would like to discuss Advanced Directive--CSW referral placed and placed referral for dietician as well.

## 2023-06-01 NOTE — Progress Notes (Signed)
 [facility] OFFICE PROGRESS NOTE   Diagnosis: Extrahepatic cholangiocarcinoma  INTERVAL HISTORY:   Robert [name] returns as scheduled.  He feels well.  No nausea, abdominal pain, or diarrhea.  Good appetite.  He is working.  No recurrent jaundice. He saw Dr. Wheeler Hammonds on 05/27/2023.  A CT abdomen showed a cystic lesion adjacent to the intrahepatic portal vein bifurcation-unchanged paired to the CT 05/02/2023.  Soft tissue was noted at the hepatic hilum with biliary stents in place with decreased biliary duct dilation.  The soft tissue is not significantly changed. Dr. Wheeler Hammonds feels the tumor is unresectable at present.  He recommends neoadjuvant therapy.  He is presenting Robert [name]'s case at the GI hepatobiliary conference. A plain x-ray 05/25/2023 found the pancreatic duct stent to remain in place.  He is scheduled for a repeat chest x-ray with Dr. Brice Campi next week. Objective:  Vital signs in last 24 hours:  Blood pressure 137/66, pulse 69, temperature 98.1 F (36.7 C), temperature source Temporal, resp. rate 18, height 5\' 7"  (1.702 m), weight 216 lb 6.4 oz (98.2 kg), SpO2 98%.   Lymphatics: No cervical, supraclavicular, axillary, or inguinal nodes Resp: Distant breath sounds, scattered expiratory wheeze, no respiratory distress Cardio: Regular rate and rhythm, distant heart sounds GI: No hepatosplenomegaly, no mass, nontender Vascular: Varicosities and chronic stasis change of the lower leg bilaterally   Lab Results:  Lab Results  Component Value Date   WBC 13.6 (H) 05/07/2023   HGB 13.1 05/07/2023   HCT 37.3 (L) 05/07/2023   MCV 94.0 05/07/2023   PLT 219 05/07/2023   NEUTROABS 6,787 01/18/2023    CMP  Lab Results  Component Value Date   NA 134 (L) 05/07/2023   K 4.0 05/07/2023   CL 98 05/07/2023   CO2 26 05/07/2023   GLUCOSE 122 (H) 05/07/2023   BUN 13 05/07/2023   CREATININE 0.74 05/07/2023   CALCIUM  9.2 05/07/2023   PROT 6.3 (L) 05/07/2023    ALBUMIN 2.5 (L) 05/07/2023   AST 127 (H) 05/07/2023   ALT 186 (H) 05/07/2023   ALKPHOS 281 (H) 05/07/2023   BILITOT 13.7 (H) 05/07/2023   GFRNONAA >60 05/07/2023   GFRAA 117 03/21/2020    Lab Results  Component Value Date   CEA1 4.8 (H) 05/03/2023   CAN199 2,144 (H) 05/03/2023   CA 19-9 at [facility] on 05/27/2023-129 CEA-3.9 (less than 2.5)   Medications: I have reviewed the patient's current medications.   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 with dilation of the left and right biliary systems, left and right hepatic stents and temporary pancreatic stent placed, stricture brushing-adenocarcinoma CT chest 05/06/2023: No evidence of metastatic disease Elevated CA 19-9 on 05/03/2023 CT abdomen 05/27/2023 (due), decreased Intermatic biliary duct dilation, status post stenting of common bile duct, persistent soft tissue at the hepatic hilum, pancreatic duct stent in place Biliary obstruction secondary #1, status post placement of right and left hepatic duct stents 05/06/2023 Prostate cancer, status post prostatectomy in 2018,pT3a,pN0, Gleason 7 COPD Hypertension Hyperlipidemia Tobacco use    Disposition: Robert [name] has been diagnosed with extrahepatic cholangiocarcinoma at the hepatic hilum.  There is no clinical or radiologic evidence of metastatic disease.  He saw  Dr. Wheeler Hammonds on 05/27/2023.  Dr. Wheeler Hammonds feels the tumor is not resectable at present.  He recommends neoadjuvant therapy.  He plans to present the case at the [facility] GI tumor conference for treatment recommendations.  I discussed  potential neoadjuvant options with [name].  This includes systemic therapy with gemcitabine/cisplatin/durvalumab, concurrent chemotherapy and radiation, and SBRT.  I will contact [name] with recommendations from the [facility] multidisciplinary conference.  He will return for an office visit and further discussion pending the treatment recommendation.  We will make a radiation oncology referral as indicated.  Coni Deep, MD  06/01/2023  2:00 PM

## 2023-06-02 ENCOUNTER — Encounter: Payer: Self-pay | Admitting: *Deleted

## 2023-06-02 ENCOUNTER — Telehealth: Payer: Self-pay

## 2023-06-02 ENCOUNTER — Other Ambulatory Visit: Payer: Self-pay | Admitting: *Deleted

## 2023-06-02 DIAGNOSIS — C240 Malignant neoplasm of extrahepatic bile duct: Secondary | ICD-10-CM

## 2023-06-02 NOTE — Progress Notes (Signed)
 PATIENT NAVIGATOR PROGRESS NOTE  Name: Robert [name] Date: 06/02/2023 MRN: [id number]  DOB: [birth year]   Reason for visit:  F/U telephone call  Comments:  Called and spoke with [name] and have scheduled him to return to clinic for treatment discussion on 5/27/ with Dr Scherrie Curt  Also discussed Port placement and have scheduled him to have port placed on 06/10/23 at [facility].  Will give written instruction at 5/27 visit    Time spent counseling/coordinating care: 45-60 minutes

## 2023-06-02 NOTE — Telephone Encounter (Signed)
 [facility] Clinical Social Work  Clinical Social Work was referred by medical provider for [employer].  Clinical Social Worker contacted patient by phone to offer support and assess for needs.  CSW Provided education on advance directives and how to obtain one through [facility]. Patient declined to schedule at time of call. CSW contact information sent via mychart if needed.  Maudie Sorrow, LCSW  Clinical Social Worker [facility]

## 2023-06-07 ENCOUNTER — Encounter: Payer: Self-pay | Admitting: Gastroenterology

## 2023-06-07 ENCOUNTER — Inpatient Hospital Stay (HOSPITAL_BASED_OUTPATIENT_CLINIC_OR_DEPARTMENT_OTHER): Admitting: Oncology

## 2023-06-07 ENCOUNTER — Telehealth: Payer: Self-pay | Admitting: *Deleted

## 2023-06-07 VITALS — BP 138/67 | HR 72 | Temp 97.8°F | Resp 18 | Ht 67.0 in | Wt 216.4 lb

## 2023-06-07 DIAGNOSIS — C61 Malignant neoplasm of prostate: Secondary | ICD-10-CM | POA: Diagnosis not present

## 2023-06-07 DIAGNOSIS — C240 Malignant neoplasm of extrahepatic bile duct: Secondary | ICD-10-CM | POA: Diagnosis not present

## 2023-06-07 DIAGNOSIS — C221 Intrahepatic bile duct carcinoma: Secondary | ICD-10-CM | POA: Diagnosis not present

## 2023-06-07 DIAGNOSIS — F17210 Nicotine dependence, cigarettes, uncomplicated: Secondary | ICD-10-CM | POA: Diagnosis not present

## 2023-06-07 DIAGNOSIS — R634 Abnormal weight loss: Secondary | ICD-10-CM | POA: Diagnosis not present

## 2023-06-07 DIAGNOSIS — Z8546 Personal history of malignant neoplasm of prostate: Secondary | ICD-10-CM | POA: Diagnosis not present

## 2023-06-07 DIAGNOSIS — Z860101 Personal history of adenomatous and serrated colon polyps: Secondary | ICD-10-CM | POA: Diagnosis not present

## 2023-06-07 NOTE — Telephone Encounter (Signed)
 Got it.  Robert [name], Let patient know he cannot unfortunately be done at [facility]. Please schedule this patient for next available EGD with stent pull in the hospital-based setting.  We should have some availability due to recent cancellation. Let me know the date that he is scheduled for. Thanks. GM

## 2023-06-07 NOTE — Progress Notes (Signed)
 [facility] OFFICE PROGRESS NOTE   Diagnosis: Extrahepatic cholangiocarcinoma  INTERVAL HISTORY:   Robert [name] turns as scheduled.  He feels well.  No complaint.  He is working.  He is scheduled for an abdominal x-ray on 06/09/2023.  Objective:  Vital signs in last 24 hours:  Blood pressure 138/67, pulse 72, temperature 97.8 F (36.6 C), temperature source Temporal, resp. rate 18, height 5\' 7"  (1.702 m), weight 216 lb 6.4 oz (98.2 kg), SpO2 100%.    Resp: Breath sounds with scattered inspiratory rhonchi, no respiratory distress Cardio: Heart sounds, regular rhythm GI: No hepatosplenomegaly Vascular: No leg edema  Lab Results:  Lab Results  Component Value Date   WBC 13.6 (H) 05/07/2023   HGB 13.1 05/07/2023   HCT 37.3 (L) 05/07/2023   MCV 94.0 05/07/2023   PLT 219 05/07/2023   NEUTROABS 6,787 01/18/2023    CMP  Lab Results  Component Value Date   NA 134 (L) 05/07/2023   K 4.0 05/07/2023   CL 98 05/07/2023   CO2 26 05/07/2023   GLUCOSE 122 (H) 05/07/2023   BUN 13 05/07/2023   CREATININE 0.74 05/07/2023   CALCIUM  9.2 05/07/2023   PROT 6.3 (L) 05/07/2023   ALBUMIN 2.5 (L) 05/07/2023   AST 127 (H) 05/07/2023   ALT 186 (H) 05/07/2023   ALKPHOS 281 (H) 05/07/2023   BILITOT 13.7 (H) 05/07/2023   GFRNONAA >60 05/07/2023   GFRAA 117 03/21/2020    Lab Results  Component Value Date   CEA1 4.8 (H) 05/03/2023   ZOX096 2,144 (H) 05/03/2023    Medications: I have reviewed the patient's current medications.   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 with  dilation of the left and right biliary systems, left and right hepatic stents and temporary pancreatic stent placed, stricture brushing-adenocarcinoma CT chest 05/06/2023: No evidence of metastatic disease Elevated CA 19-9 on 05/03/2023 CT abdomen 05/27/2023 (due), decreased Intermatic biliary duct dilation, status post stenting of common bile duct, persistent soft tissue at the hepatic hilum, pancreatic duct stent in place Biliary obstruction secondary #1, status post placement of right and left hepatic duct stents 05/06/2023 Prostate cancer, status post prostatectomy in 2018,pT3a,pN0, Gleason 7 COPD Hypertension Hyperlipidemia Tobacco use     Disposition: Robert [name] has been diagnosed with extrahepatic cholangiocarcinoma.  He saw Dr. Wheeler Hammonds at [facility].  Dr. Wheeler Hammonds.  He ended his case to hepatobiliary colleagues at [facility].  The recommendation is to proceed with neoadjuvant systemic therapy and then consider surgery based on the treatment response.  The plan is to proceed with 3 cycles of gemcitabine/cisplatin/durvalumab and then refer him to Dr. Wheeler Hammonds for restaging.  I reviewed the gemcitabine/cisplatin/durvalumab regimen with Robert [name] today.  We reviewed potential toxicities including the chance of nausea/vomiting, alopecia, hematologic toxicity, infection, and bleeding.  We discussed the rash, fever, and pneumonitis associated with gemcitabine.  We discussed the neuropathy, nephrotoxicity, and ototoxicity seen with cisplatin.  We reviewed the rash, diarrhea, and various autoimmune toxicities associated with durvalumab.  He agrees to proceed.  He will attend a chemotherapy teaching class.  Robert [name] is scheduled for Port-A-Cath placement on 06/10/2023.  He will undergo removal of the pancreas duct stent by Dr.  Mansouraty following week.  He will be scheduled for an office visit and cycle 1 chemotherapy on 06/20/2023.  He is scheduled for baseline lab studies and a chemotherapy teaching class  06/08/2023.  A treatment plan was entered today.  Robert Deep, MD  06/07/2023  11:47 AM

## 2023-06-07 NOTE — Telephone Encounter (Signed)
 Dr. Brice Campi,  This pt is scheduled with you on 6/3.  He suffers from severe COPD so his procedure will need to be done at the hospital.  Best regards,  Cathryn Cobb

## 2023-06-07 NOTE — Progress Notes (Signed)
 Review of outside records to be scanned in the chart 5/20 laboratories Sodium 133 Potassium 4.0 BUN/creatinine 12/0.44 Albumin 4.0 Total protein six 6.3 Total bilirubin 2.2 Alkaline phosphatase 115 AST/ALT 20/32 Direct bilirubin 0.9 Hemoglobin/hematocrit 13.8/43.4 WBC 9.2 Platelet 180 MCV 104.1   These results will be scanned in the chart.

## 2023-06-07 NOTE — Telephone Encounter (Signed)
 Spoke with the pt and made him aware that we have cancelled the EGD in the [facility] and he will be called to set up EGD at [facility].

## 2023-06-07 NOTE — Progress Notes (Signed)
 START ON PATHWAY REGIMEN - Hepatobiliary     Cycles 1 through up to 8: A cycle is every 21 days:     Durvalumab      Gemcitabine      Cisplatin    Cycles 9 and beyond: A cycle is every 28 days:     Durvalumab   **Always confirm dose/schedule in your pharmacy ordering system**  Patient Characteristics: Cholangiocarcinoma, Unresectable/Metastatic, Systemic Therapy, First Line Hepatobiliary Disease Type: Cholangiocarcinoma Line of therapy: First Line Intent of Therapy: Curative Intent, Discussed with Patient

## 2023-06-08 ENCOUNTER — Other Ambulatory Visit: Payer: Self-pay

## 2023-06-08 ENCOUNTER — Inpatient Hospital Stay

## 2023-06-08 ENCOUNTER — Telehealth: Payer: Self-pay | Admitting: Nurse Practitioner

## 2023-06-08 ENCOUNTER — Ambulatory Visit: Payer: Self-pay | Admitting: Oncology

## 2023-06-08 ENCOUNTER — Inpatient Hospital Stay: Admitting: Nutrition

## 2023-06-08 ENCOUNTER — Other Ambulatory Visit: Payer: Self-pay | Admitting: *Deleted

## 2023-06-08 DIAGNOSIS — T85528A Displacement of other gastrointestinal prosthetic devices, implants and grafts, initial encounter: Secondary | ICD-10-CM

## 2023-06-08 DIAGNOSIS — C240 Malignant neoplasm of extrahepatic bile duct: Secondary | ICD-10-CM

## 2023-06-08 DIAGNOSIS — K831 Obstruction of bile duct: Secondary | ICD-10-CM

## 2023-06-08 DIAGNOSIS — C221 Intrahepatic bile duct carcinoma: Secondary | ICD-10-CM

## 2023-06-08 DIAGNOSIS — Z860101 Personal history of adenomatous and serrated colon polyps: Secondary | ICD-10-CM | POA: Diagnosis not present

## 2023-06-08 DIAGNOSIS — F17210 Nicotine dependence, cigarettes, uncomplicated: Secondary | ICD-10-CM | POA: Diagnosis not present

## 2023-06-08 DIAGNOSIS — R634 Abnormal weight loss: Secondary | ICD-10-CM | POA: Diagnosis not present

## 2023-06-08 DIAGNOSIS — Z8546 Personal history of malignant neoplasm of prostate: Secondary | ICD-10-CM | POA: Diagnosis not present

## 2023-06-08 DIAGNOSIS — R17 Unspecified jaundice: Secondary | ICD-10-CM

## 2023-06-08 DIAGNOSIS — C61 Malignant neoplasm of prostate: Secondary | ICD-10-CM | POA: Diagnosis not present

## 2023-06-08 LAB — CBC WITH DIFFERENTIAL (CANCER CENTER ONLY)
Abs Immature Granulocytes: 0.03 10*3/uL (ref 0.00–0.07)
Basophils Absolute: 0.1 10*3/uL (ref 0.0–0.1)
Basophils Relative: 1 %
Eosinophils Absolute: 0.4 10*3/uL (ref 0.0–0.5)
Eosinophils Relative: 4 %
HCT: 42.5 % (ref 39.0–52.0)
Hemoglobin: 14.2 g/dL (ref 13.0–17.0)
Immature Granulocytes: 0 %
Lymphocytes Relative: 22 %
Lymphs Abs: 1.8 10*3/uL (ref 0.7–4.0)
MCH: 33.5 pg (ref 26.0–34.0)
MCHC: 33.4 g/dL (ref 30.0–36.0)
MCV: 100.2 fL — ABNORMAL HIGH (ref 80.0–100.0)
Monocytes Absolute: 1.1 10*3/uL — ABNORMAL HIGH (ref 0.1–1.0)
Monocytes Relative: 13 %
Neutro Abs: 5.1 10*3/uL (ref 1.7–7.7)
Neutrophils Relative %: 60 %
Platelet Count: 226 10*3/uL (ref 150–400)
RBC: 4.24 MIL/uL (ref 4.22–5.81)
RDW: 13.6 % (ref 11.5–15.5)
WBC Count: 8.4 10*3/uL (ref 4.0–10.5)
nRBC: 0 % (ref 0.0–0.2)

## 2023-06-08 LAB — MAGNESIUM: Magnesium: 1.9 mg/dL (ref 1.7–2.4)

## 2023-06-08 LAB — CMP (CANCER CENTER ONLY)
ALT: 53 U/L — ABNORMAL HIGH (ref 0–44)
AST: 52 U/L — ABNORMAL HIGH (ref 15–41)
Albumin: 3.9 g/dL (ref 3.5–5.0)
Alkaline Phosphatase: 252 U/L — ABNORMAL HIGH (ref 38–126)
Anion gap: 11 (ref 5–15)
BUN: 11 mg/dL (ref 8–23)
CO2: 29 mmol/L (ref 22–32)
Calcium: 10.1 mg/dL (ref 8.9–10.3)
Chloride: 97 mmol/L — ABNORMAL LOW (ref 98–111)
Creatinine: 0.74 mg/dL (ref 0.61–1.24)
GFR, Estimated: >60 mL/min (ref >60)
Glucose, Bld: 107 mg/dL — ABNORMAL HIGH (ref 70–99)
Potassium: 4.5 mmol/L (ref 3.5–5.1)
Sodium: 137 mmol/L (ref 135–145)
Total Bilirubin: 1.3 mg/dL — ABNORMAL HIGH (ref 0.0–1.2)
Total Protein: 7.4 g/dL (ref 6.5–8.1)

## 2023-06-08 MED ORDER — LIDOCAINE-PRILOCAINE 2.5-2.5 % EX CREA: 1.0000 | TOPICAL_CREAM | CUTANEOUS | 0 refills | Status: DC | PRN

## 2023-06-08 MED ORDER — PROCHLORPERAZINE MALEATE 10 MG PO TABS: 10.0000 mg | ORAL_TABLET | Freq: Four times a day (QID) | ORAL | 0 refills | Status: DC | PRN

## 2023-06-08 MED ORDER — ONDANSETRON HCL 8 MG PO TABS: 8.0000 mg | ORAL_TABLET | Freq: Three times a day (TID) | ORAL | 0 refills | Status: AC | PRN

## 2023-06-08 NOTE — Telephone Encounter (Signed)
 EGD has been moved to 08/16/23 at 230 pm at [facility] with [facility]

## 2023-06-08 NOTE — Progress Notes (Signed)
 68 year old male diagnosed with Extrahepatic Cholangiocarcinoma and followed by Dr. Scherrie Curt. Will receive cisplatin, gemcitabine and Durvalumab.  PMH includes Prostate cancer S/p prostatectomy 2018, COPD, HTN, HLD.  Medications include Protonix .  Labs include Sodium 134, Glucose 122 and Albumin 2.5.  Height: 67 inches. Weight: 216 pounds 6.4 oz.  Patient weighed 249 pounds 6.4 oz October 14, 2022. BMI: 33.89.  Patient states 40 pound weight loss prior to diagnosis was intentional. He was on Wegovy  and making dietary changes. He denies nutrition impact symptoms at this time. Generally eats peanut butter and banana sandwich for breakfast/snack, leftovers from dinner the night before for lunch and meat, starch and vegetable for dinner. He snacks on fruit between meals. He has not tried ONS.  Nutrition Diagnosis: Food and Nutrition Related Knowledge Deficit related to cancer and associated treatments as evidenced by no prior need for nutrition related information.  Intervention: Educated on importance of small frequent meals and snacks with adequate calories and protein. Reviewed high protein snacks. Discouraged intentional weight loss. Stressed importance of adequate hydration. Educated on strategies for eating with nausea and vomiting. Nutrition fact sheets provided and contact information given.  Monitoring, Evaluation, Goals: Tolerate adequate calories and protein for weight maintenance.  Next Visit to be scheduled as needed. Patient has RD contact information.

## 2023-06-08 NOTE — Telephone Encounter (Signed)
 Patient has been scheduled for follow-up visit per 06/08/23 LOS.  Pt aware of scheduled appt details.

## 2023-06-08 NOTE — Telephone Encounter (Signed)
 The pt returned the call and is able to make the 6/5 appt at 3 pm.  All instructions discussed and sent to My Chart

## 2023-06-08 NOTE — Telephone Encounter (Signed)
 There has been a cancellation on 6/5 at 3 pm call placed to see if pt can accommodate Left message on machine to call back

## 2023-06-09 ENCOUNTER — Other Ambulatory Visit: Payer: Self-pay | Admitting: Radiology

## 2023-06-09 ENCOUNTER — Ambulatory Visit (INDEPENDENT_AMBULATORY_CARE_PROVIDER_SITE_OTHER)
Admission: RE | Admit: 2023-06-09 | Discharge: 2023-06-09 | Disposition: A | Discharge status: Home / Self Care | Source: Ambulatory Visit | Attending: Gastroenterology | Admitting: Gastroenterology

## 2023-06-09 DIAGNOSIS — T85528A Displacement of other gastrointestinal prosthetic devices, implants and grafts, initial encounter: Secondary | ICD-10-CM

## 2023-06-09 DIAGNOSIS — Z4682 Encounter for fitting and adjustment of non-vascular catheter: Secondary | ICD-10-CM | POA: Diagnosis not present

## 2023-06-09 DIAGNOSIS — Z48815 Encounter for surgical aftercare following surgery on the digestive system: Secondary | ICD-10-CM | POA: Diagnosis not present

## 2023-06-09 DIAGNOSIS — C240 Malignant neoplasm of extrahepatic bile duct: Secondary | ICD-10-CM | POA: Diagnosis not present

## 2023-06-09 LAB — CANCER ANTIGEN 19-9: CA 19-9: 351 U/mL — ABNORMAL HIGH (ref 0–35)

## 2023-06-10 ENCOUNTER — Encounter (HOSPITAL_COMMUNITY): Payer: Self-pay

## 2023-06-10 ENCOUNTER — Encounter: Payer: Self-pay | Admitting: Oncology

## 2023-06-10 ENCOUNTER — Ambulatory Visit (HOSPITAL_COMMUNITY)
Admission: RE | Admit: 2023-06-10 | Discharge: 2023-06-10 | Disposition: A | Discharge status: Home / Self Care | Source: Ambulatory Visit | Attending: Oncology | Admitting: Oncology

## 2023-06-10 ENCOUNTER — Other Ambulatory Visit: Payer: Self-pay

## 2023-06-10 DIAGNOSIS — F17210 Nicotine dependence, cigarettes, uncomplicated: Secondary | ICD-10-CM | POA: Insufficient documentation

## 2023-06-10 DIAGNOSIS — C240 Malignant neoplasm of extrahepatic bile duct: Secondary | ICD-10-CM | POA: Insufficient documentation

## 2023-06-10 DIAGNOSIS — Z452 Encounter for adjustment and management of vascular access device: Secondary | ICD-10-CM | POA: Diagnosis not present

## 2023-06-10 HISTORY — PX: IR IMAGING GUIDED PORT INSERTION: IMG5740

## 2023-06-10 LAB — CBC
HCT: 42.8 % (ref 39.0–52.0)
Hemoglobin: 14.5 g/dL (ref 13.0–17.0)
MCH: 33.6 pg (ref 26.0–34.0)
MCHC: 33.9 g/dL (ref 30.0–36.0)
MCV: 99.3 fL (ref 80.0–100.0)
Platelets: 235 10*3/uL (ref 150–400)
RBC: 4.31 MIL/uL (ref 4.22–5.81)
RDW: 13.3 % (ref 11.5–15.5)
WBC: 8.2 10*3/uL (ref 4.0–10.5)
nRBC: 0 % (ref 0.0–0.2)

## 2023-06-10 LAB — PROTIME-INR
INR: 0.9 (ref 0.8–1.2)
Prothrombin Time: 12.4 s (ref 11.4–15.2)

## 2023-06-10 MED ORDER — SODIUM CHLORIDE 0.9% FLUSH: 3.0000 mL | INTRAVENOUS | Status: DC | PRN

## 2023-06-10 MED ORDER — FENTANYL CITRATE (PF) 100 MCG/2ML IJ SOLN
INTRAMUSCULAR | Status: AC | PRN
  Administered 2023-06-10: 50 ug via INTRAVENOUS
  Administered 2023-06-10 (×2): 25 ug via INTRAVENOUS

## 2023-06-10 MED ORDER — LIDOCAINE-EPINEPHRINE 1 %-1:100000 IJ SOLN
INTRAMUSCULAR | Status: AC
  Filled 2023-06-10: qty 1

## 2023-06-10 MED ORDER — FENTANYL CITRATE (PF) 100 MCG/2ML IJ SOLN
INTRAMUSCULAR | Status: AC
  Filled 2023-06-10: qty 2

## 2023-06-10 MED ORDER — HEPARIN SOD (PORK) LOCK FLUSH 100 UNIT/ML IV SOLN
INTRAVENOUS | Status: AC
  Filled 2023-06-10: qty 5

## 2023-06-10 MED ORDER — LIDOCAINE-EPINEPHRINE 1 %-1:100000 IJ SOLN: 20.0000 mL | Freq: Once | INTRAMUSCULAR | Status: DC

## 2023-06-10 MED ORDER — MIDAZOLAM HCL 2 MG/2ML IJ SOLN
INTRAMUSCULAR | Status: AC | PRN
  Administered 2023-06-10 (×2): 1 mg via INTRAVENOUS

## 2023-06-10 MED ORDER — SODIUM CHLORIDE 0.9% FLUSH: 3.0000 mL | Freq: Two times a day (BID) | INTRAVENOUS | Status: DC

## 2023-06-10 MED ORDER — MIDAZOLAM HCL 2 MG/2ML IJ SOLN
INTRAMUSCULAR | Status: AC
  Filled 2023-06-10: qty 2

## 2023-06-10 NOTE — H&P (Signed)
 Chief Complaint: Cholangiocarcinoma  Referring Provider(s): Coni Deep  Supervising Physician: Myrlene Asper  Patient Status: [facility] - Out-pt  History of Present Illness: Robert [name] is a 68 y.o. male  with medical issues including COPD, hypertension, and prostate cancer status post radical prostatectomy in 2018.  He presented to the ED on 05/02/23 with jaundice, nausea, vomiting, and loose stools.   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  He is NPO. No nausea/vomiting. No Fever/chills. ROS negative.   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   COPD (chronic obstructive pulmonary disease) (HCC)    Elevated hemoglobin A1c    Elevated PSA 03/16/2016   Hypertension    Personal history of colonic adenomas 12/28/2007   Vitamin D  deficiency     Past Surgical History:  Procedure Laterality Date   BILIARY BRUSHING  05/06/2023   Procedure: BRUSH BIOPSY, BILE DUCT;  Surgeon: Normie Becton., MD;  Location: [facility] ENDOSCOPY;  Service: Gastroenterology;;   BILIARY STENT PLACEMENT  05/06/2023   Procedure: INSERTION, STENT, BILE DUCT;  Surgeon: Normie Becton., MD;  Location: [facility] ENDOSCOPY;  Service: Gastroenterology;;   BIOPSY OF SKIN SUBCUTANEOUS TISSUE AND/OR MUCOUS MEMBRANE  05/06/2023   Procedure: BIOPSY, SKIN,  SUBCUTANEOUS TISSUE, OR MUCOUS MEMBRANE;  Surgeon: Normie Becton., MD;  Location: [facility] ENDOSCOPY;  Service: Gastroenterology;;   COLONOSCOPY  2009   CG  hems, TAs   ERCP N/A 05/06/2023   Procedure: ERCP, WITH INTERVENTION IF INDICATED;  Surgeon: Normie Becton., MD;  Location: [facility] ENDOSCOPY;  Service: Gastroenterology;  Laterality: N/A;   LYMPHADENECTOMY N/A 06/28/2016   Procedure: LYMPHADENECTOMY;  Surgeon: Florencio Hunting, MD;  Location: [facility] ORS;  Service: Urology;  Laterality: N/A;   PANCREATIC STENT PLACEMENT  05/06/2023   Procedure: INSERTION, STENT, PANCREATIC DUCT;  Surgeon: Brice Campi Albino Alu., MD;  Location: [facility] ENDOSCOPY;  Service: Gastroenterology;;   PROSTATE BIOPSY  03/16/2016   Surgical Pathology Gross and Microscopic Examination for Prostate Needle   ROBOT ASSISTED LAPAROSCOPIC RADICAL PROSTATECTOMY N/A 06/28/2016   Procedure: XI ROBOTIC ASSISTED LAPAROSCOPIC RADICAL PROSTATECTOMY LEVEL 2 EXCISION OF PENILE LESION;  Surgeon: Florencio Hunting, MD;  Location: [facility] ORS;  Service: Urology;  Laterality: N/A;    Allergies: Codeine and Isovue  [iopamidol ]  Medications: Prior to Admission medications   Medication Sig Start Date End Date Taking? Authorizing Provider  albuterol  (VENTOLIN  HFA) 108 (90 Base) MCG/ACT inhaler Inhale 2 puffs into the lungs every 6 (six) hours as needed for wheezing or shortness of breath. Patient not taking: Reported on 06/07/2023 02/17/22   Sira, Zackery, MD  aspirin  EC 81 MG tablet Take 81 mg by mouth daily.    [provider]  atorvastatin  (LIPITOR) 20 MG tablet TAKE 1 TABLET BY MOUTH DAILY FOR CHOLESTEROL Patient not taking: Reported on 06/07/2023 06/27/22   Wilkinson, Dana E, FNP  benazepril -hydrochlorthiazide (LOTENSIN  HCT) 20-25 MG tablet Take 1  tablet Daily for BP                                               /                                                                   TAKE                                         BY                                                  MOUTH Patient taking differently: 0.5 tablets. Take 1 tablet Daily for BP      Pt taking different 5 [place] Progression Recent Vital Signs  @VS @   Past Medical History: No date: Arthritis     Comment:  hands/knees 02/18/2016: Bladder neck obstruction No date: COPD (chronic obstructive pulmonary disease) (HCC) No date: Elevated hemoglobin A1c 03/16/2016: Elevated PSA No date: Hypertension 12/28/2007: Personal history of colonic adenomas No date: Vitamin D  deficiency   Expected Discharge Date    Diet Order    None      VTE Documentation     Work Intensity Score/Level of Care    @LEVELOFCARE @   Mobility       Significant Events   DC Barriers  Abnormal Labs:  Per Pt  taking medication different 5 [place] Progression Recent Vital Signs  @VS @   Past Medical History: No date: Arthritis     Comment:  hands/knees 02/18/2016: Bladder neck obstruction No date: COPD (chronic obstructive pulmonary disease) (HCC) No date: Elevated hemoglobin A1c 03/16/2016: Elevated PSA No date: Hypertension 12/28/2007: Personal history of colonic adenomas No date: Vitamin D  deficiency   Expected Discharge Date    Diet Order    None      VTE Documentation     Work Intensity Score/Level of Care    @LEVELOFCARE @   Mobility       Significant Event                                /                                                                   TAKE                                         BY  MOUTH 12/25/22   Vangie Genet, MD  BREZTRI  AEROSPHERE 160-9-4.8 MCG/ACT AERO inhaler Inhale 2 puffs into the lungs 2 (two) times daily. 04/22/23   [provider]  lidocaine -prilocaine (EMLA) cream Apply 1 Application topically as needed (As directed and may start to use 2 weeks after port placed). 06/08/23   Sumner Ends, MD  ondansetron  (ZOFRAN ) 8 MG tablet Take 1 tablet (8 mg total) by  mouth every 8 (eight) hours as needed (May use 3 days after chemo as needed for nausea). 06/08/23   Sumner Ends, MD  pantoprazole  (PROTONIX ) 40 MG tablet Take 1 tablet (40 mg total) by mouth daily. 05/07/23   Gonfa, Taye T, MD  prochlorperazine  (COMPAZINE ) 10 MG tablet Take 1 tablet (10 mg total) by mouth every 6 (six) hours as needed for nausea or vomiting. 06/08/23   Sumner Ends, MD     Family History  Problem Relation Age of Onset   Cancer Mother        lung   Lung disease Neg Hx    Colon cancer Neg Hx    Rectal cancer Neg Hx    Stomach cancer Neg Hx     Social History   Socioeconomic History   Marital status: Widowed    Spouse name: Not on file   Number of children: Not on file   Years of education: Not on file   Highest education level: Not on file  Occupational History   Not on file  Tobacco Use   Smoking status: Every Day    Current packs/day: 0.50    Average packs/day: 0.5 packs/day for 40.0 years (20.0 ttl pk-yrs)    Types: Cigarettes   Smokeless tobacco: Never   Tobacco comments:    has decreased smoking significantly with goal of quitting  Vaping Use   Vaping status: Never Used  Substance and Sexual Activity   Alcohol use: Yes    Alcohol/week: 4.0 standard drinks of alcohol    Types: 4 Cans of beer per week    Comment: occasionally   Drug use: No   Sexual activity: Yes  Other Topics Concern   Not on file  Social History Narrative   [place] [facility] (06/29/16):   Currently works as a Chartered certified accountant. Exposure primarily to aluminum and steel. No history of bird or mold exposure. No recent hot tub exposure. Does have 2 dogs.   Social Drivers of Corporate investment banker Strain: Not on file  Food Insecurity: No Food Insecurity (06/01/2023)   Hunger Vital Sign    Worried About Running Out of Food in the Last Year: Never true    Ran Out of Food in the Last Year: Never true  Recent Concern: Food Insecurity - Food Insecurity Present (06/01/2023)   Hunger  Vital Sign    Worried About Running Out of Food in the Last Year: Never true    Ran Out of Food in the Last Year: Sometimes true  Transportation Needs: No Transportation Needs (06/01/2023)   PRAPARE - Administrator, Civil Service (Medical): No    Lack of Transportation (Non-Medical): No  Physical Activity: Not on file  Stress: Not on file  Social Connections: Socially Isolated (05/03/2023)   Social Connection and Isolation Panel [NHANES]    Frequency of Communication with Friends and Family: More than three times a week    Frequency of Social Gatherings with Friends and Family: Twice a week    Attends Religious Services: Never  Active Member of Clubs or Organizations: No    Attends Banker Meetings: Never    Marital Status: Widowed     Review of Systems: A 12 point ROS discussed and pertinent positives are indicated in the HPI above.  All other systems are negative.    Vital Signs: There were no vitals taken for this visit.  Advance Care Plan: The advanced care place/surrogate decision maker was discussed at the time of visit and the patient did not wish to discuss or was not able to name a surrogate decision maker or provide an advance care plan.  Physical Exam Vitals reviewed.  Constitutional:      Appearance: Normal appearance.  HENT:     Head: Normocephalic and atraumatic.  Eyes:     Extraocular Movements: Extraocular movements intact.  Cardiovascular:     Rate and Rhythm: Normal rate and regular rhythm.  Pulmonary:     Effort: Pulmonary effort is normal. No respiratory distress.     Breath sounds: Normal breath sounds.  Abdominal:     General: There is no distension.     Palpations: Abdomen is soft.     Tenderness: There is no abdominal tenderness.  Musculoskeletal:        General: Normal range of motion.     Cervical back: Normal range of motion.  Skin:    General: Skin is warm and dry.  Neurological:     General: No focal deficit  present.     Mental Status: He is alert and oriented to person, place, and time.  Psychiatric:        Mood and Affect: Mood normal.        Behavior: Behavior normal.        Thought Content: Thought content normal.        Judgment: Judgment normal.     Imaging: DG Abd 2 Views Result Date: 05/30/2023 CLINICAL DATA:  Follow-up pancreatic stent EXAM: ABDOMEN - 2 VIEW COMPARISON:  05/06/2023 FINDINGS: Scattered large and small bowel gas is noted. Two biliary stents are noted. Small pancreatic stent is noted as well. These are stable in appearance from that seen on prior the are seen. No free air is noted. No obstructive changes are seen. IMPRESSION: Two biliary stents and a pancreatic stent are again noted in satisfactory position. Electronically Signed   By: Violeta Grey M.D.   On: 05/30/2023 02:47    Labs:  CBC: Recent Labs    05/05/23 0703 05/06/23 0708 05/07/23 0820 06/08/23 1558  WBC 9.3 8.9 13.6* 8.4  HGB 14.2 13.4 13.1 14.2  HCT 40.5 37.8* 37.3* 42.5  PLT 240 219 219 226    COAGS: Recent Labs    05/03/23 0528  INR 1.0    BMP: Recent Labs    05/05/23 0703 05/06/23 0708 05/07/23 0820 06/08/23 1558  NA 134* 136 134* 137  K 3.5 4.3 4.0 4.5  CL 96* 101 98 97*  CO2 27 26 26 29   GLUCOSE 112* 92 122* 107*  BUN 9 10 13 11   CALCIUM  9.4 9.3 9.2 10.1  CREATININE 0.56* 0.58* 0.74 0.74  GFRNONAA >60 >60 >60 >60    LIVER FUNCTION TESTS: Recent Labs    05/05/23 0703 05/06/23 0708 05/07/23 0820 06/08/23 1558  BILITOT 20.8* 20.2* 13.7* 1.3*  AST 226* 202* 127* 52*  ALT 232* 213* 186* 53*  ALKPHOS 347* 301* 281* 252*  PROT 6.6 6.0* 6.3* 7.4  ALBUMIN 2.7* 2.5* 2.5* 3.9    TUMOR MARKERS:  No results for input(s): "AFPTM", "CEA", "CA199", "CHROMGRNA" in the last 8760 hours.  Assessment and Plan:  Cholangiocarcinoma - Needs Port A Cath for chemotherapy.  Will proceed with image guided placement of a Port A Cath today by Dr. Mabel Savage.  Risks and benefits of image  guided port-a-catheter placement was discussed with the patient including, but not limited to bleeding, infection, pneumothorax, or fibrin sheath development and need for additional procedures.  All of the patient's questions were answered, patient is agreeable to proceed. Consent signed and in chart.   Electronically Signed: Connor Deiters, PA-C   06/10/2023, 8:57 AM      I spent a total of  30 Minutes   in face to face in clinical consultation, greater than 50% of which was counseling/coordinating care for [facility] A Cath placement.

## 2023-06-10 NOTE — Progress Notes (Signed)
 Pharmacist Chemotherapy Monitoring - Initial Assessment    Anticipated start date: 06/20/23   The following has been reviewed per standard work regarding the patient's treatment regimen: The patient's diagnosis, treatment plan and drug doses, and organ/hematologic function Lab orders and baseline tests specific to treatment regimen  The treatment plan start date, drug sequencing, and pre-medications Prior authorization status  Patient's documented medication list, including drug-drug interaction screen and prescriptions for anti-emetics and supportive care specific to the treatment regimen The drug concentrations, fluid compatibility, administration routes, and timing of the medications to be used The patient's access for treatment and lifetime cumulative dose history, if applicable  The patient's medication allergies and previous infusion related reactions, if applicable   Changes made to treatment plan:  N/A  Follow up needed:  Pending authorization for treatment    Robert [name], [facility], 06/10/2023  2:51 PM

## 2023-06-10 NOTE — Telephone Encounter (Signed)
 LVM with marked improvement in liver functions. Will recheck CMP on day of treatment. Orders placed.

## 2023-06-10 NOTE — Procedures (Signed)
 Interventional Radiology Procedure Note  Procedure: Placement of a right IJ approach single lumen PowerPort.  Tip is positioned at the superior cavoatrial junction and catheter is ready for immediate use.  Complications: None Recommendations:  - Ok to shower tomorrow - Do not submerge for 7 days - Routine line care   Signed,  Yvone Neu. Loreta Ave, DO, ABVM, RPVI

## 2023-06-14 ENCOUNTER — Encounter (HOSPITAL_COMMUNITY): Payer: Self-pay | Admitting: Gastroenterology

## 2023-06-14 ENCOUNTER — Encounter: Admitting: Gastroenterology

## 2023-06-15 DIAGNOSIS — C240 Malignant neoplasm of extrahepatic bile duct: Secondary | ICD-10-CM | POA: Diagnosis not present

## 2023-06-16 ENCOUNTER — Ambulatory Visit (HOSPITAL_COMMUNITY)
Admission: RE | Admit: 2023-06-16 | Discharge: 2023-06-16 | Disposition: A | Discharge status: Home / Self Care | Attending: Gastroenterology | Admitting: Gastroenterology

## 2023-06-16 ENCOUNTER — Encounter (HOSPITAL_COMMUNITY)
Admission: RE | Disposition: A | Discharge status: Home / Self Care | Payer: Self-pay | Source: Home / Self Care | Attending: Gastroenterology

## 2023-06-16 ENCOUNTER — Ambulatory Visit (HOSPITAL_COMMUNITY): Admitting: Anesthesiology

## 2023-06-16 ENCOUNTER — Encounter (HOSPITAL_COMMUNITY): Payer: Self-pay | Admitting: Gastroenterology

## 2023-06-16 ENCOUNTER — Other Ambulatory Visit: Payer: Self-pay

## 2023-06-16 DIAGNOSIS — J449 Chronic obstructive pulmonary disease, unspecified: Secondary | ICD-10-CM | POA: Diagnosis not present

## 2023-06-16 DIAGNOSIS — K209 Esophagitis, unspecified without bleeding: Secondary | ICD-10-CM

## 2023-06-16 DIAGNOSIS — Z4689 Encounter for fitting and adjustment of other specified devices: Secondary | ICD-10-CM

## 2023-06-16 DIAGNOSIS — Z4659 Encounter for fitting and adjustment of other gastrointestinal appliance and device: Secondary | ICD-10-CM | POA: Diagnosis not present

## 2023-06-16 DIAGNOSIS — K831 Obstruction of bile duct: Secondary | ICD-10-CM

## 2023-06-16 DIAGNOSIS — K2970 Gastritis, unspecified, without bleeding: Secondary | ICD-10-CM | POA: Diagnosis not present

## 2023-06-16 DIAGNOSIS — Z860101 Personal history of adenomatous and serrated colon polyps: Secondary | ICD-10-CM | POA: Insufficient documentation

## 2023-06-16 DIAGNOSIS — K269 Duodenal ulcer, unspecified as acute or chronic, without hemorrhage or perforation: Secondary | ICD-10-CM | POA: Diagnosis not present

## 2023-06-16 DIAGNOSIS — Z9889 Other specified postprocedural states: Secondary | ICD-10-CM

## 2023-06-16 DIAGNOSIS — I10 Essential (primary) hypertension: Secondary | ICD-10-CM | POA: Diagnosis not present

## 2023-06-16 DIAGNOSIS — T85528A Displacement of other gastrointestinal prosthetic devices, implants and grafts, initial encounter: Secondary | ICD-10-CM

## 2023-06-16 DIAGNOSIS — K2980 Duodenitis without bleeding: Secondary | ICD-10-CM | POA: Insufficient documentation

## 2023-06-16 DIAGNOSIS — F17210 Nicotine dependence, cigarettes, uncomplicated: Secondary | ICD-10-CM | POA: Insufficient documentation

## 2023-06-16 DIAGNOSIS — K2289 Other specified disease of esophagus: Secondary | ICD-10-CM | POA: Diagnosis not present

## 2023-06-16 DIAGNOSIS — Z4589 Encounter for adjustment and management of other implanted devices: Secondary | ICD-10-CM | POA: Diagnosis not present

## 2023-06-16 DIAGNOSIS — C221 Intrahepatic bile duct carcinoma: Secondary | ICD-10-CM

## 2023-06-16 DIAGNOSIS — R17 Unspecified jaundice: Secondary | ICD-10-CM

## 2023-06-16 HISTORY — PX: ESOPHAGOGASTRODUODENOSCOPY: SHX5428

## 2023-06-16 SURGERY — EGD (ESOPHAGOGASTRODUODENOSCOPY)
Anesthesia: Monitor Anesthesia Care

## 2023-06-16 MED ORDER — PROPOFOL 500 MG/50ML IV EMUL
INTRAVENOUS | Status: DC | PRN
  Administered 2023-06-16: 150 ug/kg/min via INTRAVENOUS

## 2023-06-16 MED ORDER — PROPOFOL 10 MG/ML IV BOLUS
INTRAVENOUS | Status: DC | PRN
  Administered 2023-06-16 (×2): 20 mg via INTRAVENOUS
  Administered 2023-06-16: 30 mg via INTRAVENOUS

## 2023-06-16 MED ORDER — ONDANSETRON HCL 4 MG/2ML IJ SOLN: INTRAMUSCULAR | Status: DC | PRN

## 2023-06-16 MED ORDER — PANTOPRAZOLE SODIUM 40 MG PO TBEC: 40.0000 mg | DELAYED_RELEASE_TABLET | Freq: Two times a day (BID) | ORAL | 3 refills | Status: DC

## 2023-06-16 MED ORDER — LIDOCAINE HCL (CARDIAC) PF 100 MG/5ML IV SOSY
PREFILLED_SYRINGE | INTRAVENOUS | Status: DC | PRN
  Administered 2023-06-16: 100 mg via INTRAVENOUS

## 2023-06-16 MED ORDER — SODIUM CHLORIDE 0.9 % IV SOLN: INTRAVENOUS | Status: DC

## 2023-06-16 MED ORDER — SUCRALFATE 1 G PO TABS: 1.0000 g | ORAL_TABLET | Freq: Two times a day (BID) | ORAL | 0 refills | Status: DC

## 2023-06-16 NOTE — Op Note (Signed)
 [facility] Patient Name: Robert [name] Procedure Date: 06/16/2023 MRN: [id number] Attending MD: Yong Henle , MD, [id number] Date of Birth: 1955/09/12 CSN: [id number] Age: 68 Admit Type: Outpatient Procedure:                Upper GI endoscopy Indications:              Pancreatic stent removal Providers:                Yong Henle, MD, Golda Latch, RN, Nohemi Batters, Technician Referring MD:              Medicines:                Monitored Anesthesia Care Complications:            No immediate complications. Estimated Blood Loss:     Estimated blood loss was minimal. Procedure:                Pre-Anesthesia Assessment:                           - Prior to the procedure, a History and Physical                            was performed, and patient medications and                            allergies were reviewed. The patient's tolerance of                            previous anesthesia was also reviewed. The risks                            and benefits of the procedure and the sedation                            options and risks were discussed with the patient.                            All questions were answered, and informed consent                            was obtained. Prior Anticoagulants: The patient has                            taken no anticoagulant or antiplatelet agents                            except for aspirin . ASA Grade Assessment: III - A                            patient with severe systemic disease. After  reviewing the risks and benefits, the patient was                            deemed in satisfactory condition to undergo the                            procedure.                           After obtaining informed consent, the endoscope was                            passed under direct vision. Throughout the                            procedure, the patient's blood  pressure, pulse, and                            oxygen  saturations were monitored continuously. The                            [id number]-[id number] ([id number] Olympus therapeutic endoscope                            was introduced through the mouth, and advanced to                            the second part of duodenum. The upper GI endoscopy                            was accomplished without difficulty. The patient                            tolerated the procedure. Scope In: Scope Out: Findings:      No gross lesions were noted in the proximal esophagus and in the mid       esophagus.      LA Grade A (one or more mucosal breaks less than 5 mm, not extending       between tops of 2 mucosal folds) esophagitis with no bleeding was found       in the distal esophagus.      The Z-line was irregular and was found 40 cm from the incisors.      Patchy mild inflammation characterized by erosions, erythema and       friability was found in the entire examined stomach. This has previously       been biopsied and negative for H. pylori, so it was not rebiopsied.      Patchy moderate inflammation characterized by erosions, friability and       granularity was found in the duodenal bulb, in the first portion of the       duodenum and in the second portion of the duodenum.      Three previously placed plastic stents were seen at the major papilla (2       biliary and 1 pancreatic). The pancreatic stent removal was accomplished       with a  Raptor grasping device. I did notice however that at one of the       biliary stents it had embedded deep into the duodenum tissue causing an       ulcer. I subsequently gently repositioned the endoscope to move the       biliary stent stent and this exposed a deep defect of the duodenum       (described below).      One non-bleeding cratered duodenal ulcer/defect with a clean ulcer base       (Forrest Class III) was found in the second portion of the duodenum       after  having moved the biliary stent out of position of embedment. The       lesion was 10 mm in largest dimension. I could not visualize overt       perforation. However to close the defect, three hemostatic clips were       successfully placed (MR conditional). Clip manufacturer: [employer]. There was no bleeding at the end of the procedure. Impression:               - No gross lesions in the proximal esophagus and in                            the mid esophagus.                           - LA Grade A esophagitis with no bleeding found                            distally.                           - Z-line irregular, 40 cm from the incisors.                           - Gastritis. Previously bided negative for H.                            pylori.                           - Duodenitis.                           - Plastic biliary and pancreatic stents noted at                            the major papilla. Removed pancreatic stent. There                            was evidence of significant embedment of one of the                            biliary stents into the duodenal mucosa. This stent                            was gently moved which exposed  a duodenal                            ulcer/defect (describe below)                           - Non-bleeding duodenal ulcer with a clean ulcer                            base without overt perforation (Forrest Class III).                            Clips (MR conditional) were placed. Clip                            manufacturer: [employer]. Moderate Sedation:      Not Applicable - Patient had care per Anesthesia. Recommendation:           - The patient will be observed post-procedure,                            until all discharge criteria are met.                           - Discharge patient to home.                           - Patient has a contact number available for                            emergencies. The signs and  symptoms of potential                            delayed complications were discussed with the                            patient. Return to normal activities tomorrow.                            Written discharge instructions were provided to the                            patient.                           - Full liquid diet today and advance diet as                            tolerated thereafter as long as doing well.Increase                            PPI to 40 mg twice daily.                           - Carafate 1 g twice daily for 1 month.                           -  Continue present medications.                           - Observe patient's clinical course.                           - Hopeful, the patient will be able to get through                            his chemotherapy without need for stent exchange                            biliary while awaiting consideration for surgery                            for underlying cholangiocarcinoma. If LFT changes                            occur, can discuss earlier replacement attempt.                           - The findings and recommendations were discussed                            with the patient.                           - The findings and recommendations were discussed                            with the patient's family. Procedure Code(s):        --- Professional ---                           [phone], Esophagogastroduodenoscopy, flexible,                            transoral; with removal of foreign body(s) Diagnosis Code(s):        --- Professional ---                           K20.90, Esophagitis, unspecified without bleeding                           K22.89, Other specified disease of esophagus                           K29.70, Gastritis, unspecified, without bleeding                           K29.80, Duodenitis without bleeding                           Z46.59, Encounter for fitting and adjustment of  other gastrointestinal appliance and device                           K26.9, Duodenal ulcer, unspecified as acute or                            chronic, without hemorrhage or perforation CPT copyright 2022 American Medical Association. All rights reserved. The codes documented in this report are preliminary and upon coder review may  be revised to meet current compliance requirements. Yong Henle, MD 06/16/2023 11:19:57 AM Number of Addenda: 0

## 2023-06-16 NOTE — Transfer of Care (Signed)
 Immediate Anesthesia Transfer of Care Note  Patient: Robert [name]  Procedure(s) Performed: EGD (ESOPHAGOGASTRODUODENOSCOPY)  Patient Location: Endoscopy Unit  Anesthesia Type:MAC  Level of Consciousness: awake and patient cooperative  Airway & Oxygen  Therapy: Patient Spontanous Breathing and Patient connected to face mask  Post-op Assessment: Report given to RN and Post -op Vital signs reviewed and stable  Post vital signs: Reviewed and stable  Last Vitals:  Vitals Value Taken Time  BP    Temp    Pulse    Resp    SpO2      Last Pain:  Vitals:   06/16/23 0930  TempSrc: Tympanic  PainSc: 0-No pain         Complications: No notable events documented.

## 2023-06-16 NOTE — Anesthesia Postprocedure Evaluation (Signed)
 Anesthesia Post Note  Patient: Shayaan Parke  Procedure(s) Performed: EGD (ESOPHAGOGASTRODUODENOSCOPY)     Patient location during evaluation: PACU Anesthesia Type: MAC Level of consciousness: awake and alert Pain management: pain level controlled Vital Signs Assessment: post-procedure vital signs reviewed and stable Respiratory status: spontaneous breathing, nonlabored ventilation, respiratory function stable and patient connected to nasal cannula oxygen  Cardiovascular status: stable and blood pressure returned to baseline Postop Assessment: no apparent nausea or vomiting Anesthetic complications: no   No notable events documented.  Last Vitals:  Vitals:   06/16/23 1130 06/16/23 1134  BP: (!) 172/83   Pulse: (!) 52 (!) 52  Resp: 18 17  Temp:    SpO2: 100% 100%    Last Pain:  Vitals:   06/16/23 1134  TempSrc:   PainSc: 0-No pain                 Leslye Rast

## 2023-06-16 NOTE — Anesthesia Procedure Notes (Signed)
 Procedure Name: MAC Date/Time: 06/16/2023 10:31 AM  Performed by: Elvia Hammans, CRNAPre-anesthesia Checklist: Patient identified, Emergency Drugs available, Suction available, Patient being monitored and Timeout performed Patient Re-evaluated:Patient Re-evaluated prior to induction Oxygen  Delivery Method: Nasal cannula Placement Confirmation: positive ETCO2 Comments: OPtiflow O2

## 2023-06-16 NOTE — Anesthesia Preprocedure Evaluation (Addendum)
 Anesthesia Evaluation  Patient identified by MRN, date of birth, ID band Patient awake    Reviewed: Allergy & Precautions, NPO status , Patient's Chart, lab work & pertinent test results, reviewed documented beta blocker date and time   History of Anesthesia Complications Negative for: history of anesthetic complications  Airway Mallampati: II  TM Distance: >3 FB     Dental no notable dental hx.    Pulmonary neg shortness of breath, pneumonia, COPD,  COPD inhaler, neg recent URI, Current Smoker   breath sounds clear to auscultation       Cardiovascular hypertension, (-) CAD, (-) Past MI, (-) Cardiac Stents and (-) CABG  Rhythm:Regular Rate:Normal     Neuro/Psych neg Seizures  Neuromuscular disease    GI/Hepatic PUD,,,(+) neg Cirrhosis      Biliary obstruction   Endo/Other  neg diabetes    Renal/GU Renal disease     Musculoskeletal  (+) Arthritis ,    Abdominal   Peds  Hematology   Anesthesia Other Findings   Reproductive/Obstetrics                             Anesthesia Physical Anesthesia Plan  ASA: 3  Anesthesia Plan: MAC   Post-op Pain Management:    Induction: Intravenous  PONV Risk Score and Plan: 1 and Ondansetron  and Propofol  infusion  Airway Management Planned: Nasal Cannula and Natural Airway  Additional Equipment:   Intra-op Plan:   Post-operative Plan:   Informed Consent: I have reviewed the patients History and Physical, chart, labs and discussed the procedure including the risks, benefits and alternatives for the proposed anesthesia with the patient or authorized representative who has indicated his/her understanding and acceptance.       Plan Discussed with: CRNA  Anesthesia Plan Comments:         Anesthesia Quick Evaluation

## 2023-06-16 NOTE — Discharge Instructions (Signed)

## 2023-06-16 NOTE — H&P (Signed)
 GASTROENTEROLOGY PROCEDURE H&P NOTE   Primary Care Physician: Trinda Fuller, FNP  HPI: Robert [name] is a 68 y.o. male who presents for EGD for PD stent pull.  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   COPD (chronic obstructive pulmonary disease) (HCC)    Elevated hemoglobin A1c    Elevated PSA 03/16/2016   Hypertension    Personal history of colonic adenomas 12/28/2007   Vitamin D  deficiency    Past Surgical History:  Procedure Laterality Date   BILIARY BRUSHING  05/06/2023   Procedure: BRUSH BIOPSY, BILE DUCT;  Surgeon: Normie Becton., MD;  Location: [facility] ENDOSCOPY;  Service: Gastroenterology;;   BILIARY STENT PLACEMENT  05/06/2023   Procedure: INSERTION, STENT, BILE DUCT;  Surgeon: Normie Becton., MD;  Location: [facility] ENDOSCOPY;  Service: Gastroenterology;;   BIOPSY OF SKIN SUBCUTANEOUS TISSUE AND/OR MUCOUS MEMBRANE  05/06/2023   Procedure: BIOPSY, SKIN, SUBCUTANEOUS TISSUE, OR MUCOUS MEMBRANE;  Surgeon: Normie Becton., MD;  Location: [facility] ENDOSCOPY;  Service: Gastroenterology;;   COLONOSCOPY  2009   CG  hems, TAs   ERCP N/A 05/06/2023   Procedure: ERCP, WITH INTERVENTION IF INDICATED;  Surgeon: Normie Becton., MD;  Location: [facility] ENDOSCOPY;  Service: Gastroenterology;  Laterality: N/A;   IR IMAGING GUIDED PORT INSERTION  06/10/2023   LYMPHADENECTOMY N/A 06/28/2016   Procedure: LYMPHADENECTOMY;  Surgeon: Florencio Hunting, MD;  Location: [facility] ORS;  Service: Urology;  Laterality: N/A;   PANCREATIC STENT PLACEMENT  05/06/2023   Procedure: INSERTION, STENT, PANCREATIC DUCT;  Surgeon: Brice Campi Albino Alu., MD;  Location: [facility] ENDOSCOPY;  Service: Gastroenterology;;   PROSTATE BIOPSY  03/16/2016   Surgical Pathology Gross and Microscopic Examination for Prostate Needle   ROBOT ASSISTED LAPAROSCOPIC RADICAL PROSTATECTOMY N/A 06/28/2016   Procedure: XI ROBOTIC ASSISTED LAPAROSCOPIC RADICAL  PROSTATECTOMY LEVEL 2 EXCISION OF PENILE LESION;  Surgeon: Florencio Hunting, MD;  Location: [facility] ORS;  Service: Urology;  Laterality: N/A;   Current Facility-Administered Medications  Medication Dose Route Frequency Provider Last Rate Last Admin   0.9 %  sodium chloride  infusion   Intravenous Continuous Mansouraty, Ysmael Hires Jr., MD 20 mL/hr at 06/16/23 0944 Continued from Pre-op at 06/16/23 0944    Current Facility-Administered Medications:    0.9 %  sodium chloride  infusion, , Intravenous, Continuous, Mansouraty, Albino Alu., MD, Last Rate: 20 mL/hr at 06/16/23 0944, Continued from Pre-op at 06/16/23 0944 Allergies  Allergen Reactions   Codeine Itching   Isovue  [Iopamidol ] Hives    Pt. Developed 1 hive after injection; Dr. Gaines Joy spoke with patient;recommends 13 hr prep in the future   Family History  Problem Relation Age of Onset   Cancer Mother        lung   Lung disease Neg Hx    Colon cancer Neg Hx    Rectal cancer Neg Hx    Stomach cancer Neg Hx    Social History   Socioeconomic History   Marital status: Widowed    Spouse name: Not on file   Number of children: Not on file   Years of education: Not on file   Highest education level: Not on file  Occupational History   Not on file  Tobacco Use   Smoking status: Every Day    Current packs/day: 0.50    Average packs/day: 0.5 packs/day for 40.0 years (20.0 ttl pk-yrs)    Types: Cigarettes   Smokeless tobacco: Never   Tobacco comments:  has decreased smoking significantly with goal of quitting  Vaping Use   Vaping status: Never Used  Substance and Sexual Activity   Alcohol use: Yes    Alcohol/week: 4.0 standard drinks of alcohol    Types: 4 Cans of beer per week    Comment: occasionally   Drug use: No   Sexual activity: Yes  Other Topics Concern   Not on file  Social History Narrative   [facility] (06/29/16):   Currently works as a Chartered certified accountant. Exposure primarily to aluminum and steel. No history of bird or  mold exposure. No recent hot tub exposure. Does have 2 dogs.   Social Drivers of Corporate investment banker Strain: Not on file  Food Insecurity: No Food Insecurity (06/01/2023)   Hunger Vital Sign    Worried About Running Out of Food in the Last Year: Never true    Ran Out of Food in the Last Year: Never true  Recent Concern: Food Insecurity - Food Insecurity Present (06/01/2023)   Hunger Vital Sign    Worried About Running Out of Food in the Last Year: Never true    Ran Out of Food in the Last Year: Sometimes true  Transportation Needs: No Transportation Needs (06/01/2023)   PRAPARE - Administrator, Civil Service (Medical): No    Lack of Transportation (Non-Medical): No  Physical Activity: Not on file  Stress: Not on file  Social Connections: Socially Isolated (05/03/2023)   Social Connection and Isolation Panel [NHANES]    Frequency of Communication with Friends and Family: More than three times a week    Frequency of Social Gatherings with Friends and Family: Twice a week    Attends Religious Services: Never    Database administrator or Organizations: No    Attends Banker Meetings: Never    Marital Status: Widowed  Intimate Partner Violence: Not At Risk (06/01/2023)   Humiliation, Afraid, Rape, and Kick questionnaire    Fear of Current or Ex-Partner: No    Emotionally Abused: No    Physically Abused: No    Sexually Abused: No    Physical Exam: Today's Vitals   06/16/23 0930  BP: (!) 183/84  Pulse: 63  Resp: 19  Temp: (!) 97.3 F (36.3 C)  TempSrc: Tympanic  SpO2: 99%  Weight: 98 kg  Height: 5\' 6"  (1.676 m)  PainSc: 0-No pain   Body mass index is 34.86 kg/m. GEN: NAD EYE: Sclerae anicteric ENT: MMM CV: Non-tachycardic GI: Soft, NT/ND NEURO:  Alert & Oriented x 3  Lab Results: No results for input(s): "WBC", "HGB", "HCT", "PLT" in the last 72 hours. BMET No results for input(s): "NA", "K", "CL", "CO2", "GLUCOSE", "BUN",  "CREATININE", "CALCIUM " in the last 72 hours. LFT No results for input(s): "PROT", "ALBUMIN", "AST", "ALT", "ALKPHOS", "BILITOT", "BILIDIR", "IBILI" in the last 72 hours. PT/INR No results for input(s): "LABPROT", "INR" in the last 72 hours.   Impression / Plan: This is a 68 y.o.male who presents for EGD for PD stent pull.  The risks and benefits of endoscopic evaluation/treatment were discussed with the patient and/or family; these include but are not limited to the risk of perforation, infection, bleeding, missed lesions, lack of diagnosis, severe illness requiring hospitalization, as well as anesthesia and sedation related illnesses.  The patient's history has been reviewed, patient examined, no change in status, and deemed stable for procedure.  The patient and/or family is agreeable to proceed.    Yong Henle, MD  [facility] Advanced [facility]oscopy Office # [phone]

## 2023-06-17 ENCOUNTER — Other Ambulatory Visit: Payer: Self-pay | Admitting: *Deleted

## 2023-06-17 ENCOUNTER — Telehealth: Payer: Self-pay | Admitting: Genetic Counselor

## 2023-06-17 ENCOUNTER — Encounter: Payer: Self-pay | Admitting: Nurse Practitioner

## 2023-06-17 ENCOUNTER — Inpatient Hospital Stay: Admitting: Nurse Practitioner

## 2023-06-17 ENCOUNTER — Inpatient Hospital Stay

## 2023-06-17 ENCOUNTER — Inpatient Hospital Stay: Attending: Oncology

## 2023-06-17 VITALS — BP 130/65 | HR 73 | Temp 97.9°F | Resp 18 | Ht 66.0 in | Wt 216.7 lb

## 2023-06-17 DIAGNOSIS — Z79899 Other long term (current) drug therapy: Secondary | ICD-10-CM | POA: Diagnosis not present

## 2023-06-17 DIAGNOSIS — K831 Obstruction of bile duct: Secondary | ICD-10-CM | POA: Insufficient documentation

## 2023-06-17 DIAGNOSIS — C221 Intrahepatic bile duct carcinoma: Secondary | ICD-10-CM | POA: Diagnosis not present

## 2023-06-17 DIAGNOSIS — D696 Thrombocytopenia, unspecified: Secondary | ICD-10-CM | POA: Diagnosis not present

## 2023-06-17 DIAGNOSIS — E785 Hyperlipidemia, unspecified: Secondary | ICD-10-CM | POA: Diagnosis not present

## 2023-06-17 DIAGNOSIS — C61 Malignant neoplasm of prostate: Secondary | ICD-10-CM | POA: Diagnosis not present

## 2023-06-17 DIAGNOSIS — C240 Malignant neoplasm of extrahepatic bile duct: Secondary | ICD-10-CM

## 2023-06-17 DIAGNOSIS — I10 Essential (primary) hypertension: Secondary | ICD-10-CM | POA: Diagnosis not present

## 2023-06-17 DIAGNOSIS — Z8509 Personal history of malignant neoplasm of other digestive organs: Secondary | ICD-10-CM | POA: Diagnosis not present

## 2023-06-17 DIAGNOSIS — J449 Chronic obstructive pulmonary disease, unspecified: Secondary | ICD-10-CM | POA: Insufficient documentation

## 2023-06-17 DIAGNOSIS — Z5111 Encounter for antineoplastic chemotherapy: Secondary | ICD-10-CM | POA: Diagnosis not present

## 2023-06-17 LAB — CMP (CANCER CENTER ONLY)
ALT: 67 U/L — ABNORMAL HIGH (ref 0–44)
AST: 58 U/L — ABNORMAL HIGH (ref 15–41)
Albumin: 3.7 g/dL (ref 3.5–5.0)
Alkaline Phosphatase: 280 U/L — ABNORMAL HIGH (ref 38–126)
Anion gap: 11 (ref 5–15)
BUN: 10 mg/dL (ref 8–23)
CO2: 26 mmol/L (ref 22–32)
Calcium: 9.9 mg/dL (ref 8.9–10.3)
Chloride: 99 mmol/L (ref 98–111)
Creatinine: 0.75 mg/dL (ref 0.61–1.24)
GFR, Estimated: >60 mL/min (ref >60)
Glucose, Bld: 125 mg/dL — ABNORMAL HIGH (ref 70–99)
Potassium: 4.1 mmol/L (ref 3.5–5.1)
Sodium: 136 mmol/L (ref 135–145)
Total Bilirubin: 1 mg/dL (ref 0.0–1.2)
Total Protein: 7.1 g/dL (ref 6.5–8.1)

## 2023-06-17 NOTE — Progress Notes (Signed)
  [facility] OFFICE PROGRESS NOTE   Diagnosis: Extrahepatic cholangiocarcinoma  INTERVAL HISTORY:   Robert [name] returns as scheduled.  He is seen prior to beginning treatment with gemcitabine/cisplatin/Durvalumab.  He is scheduled for cycle 1 on 06/20/2023.  Overall feeling well.  No nausea.  Bowels are moving.  He had mild constipation earlier in the week, resolved with MiraLAX .  No urinary symptoms.  No fever.  No rash.  Objective:  Vital signs in last 24 hours:  Blood pressure 130/65, pulse 73, temperature 97.9 F (36.6 C), temperature source Temporal, resp. rate 18, height 5\' 6"  (1.676 m), weight 216 lb 11.2 oz (98.3 kg), SpO2 96%.    HEENT: No thrush or ulcers. Resp: Lungs clear bilaterally. Cardio: Regular rate and rhythm. GI: Nontender.  No hepatosplenomegaly. Vascular: No leg edema. Neuro: Alert and oriented. Skin: No rash. Port-A-Cath without erythema.  Lab Results:  Lab Results  Component Value Date   WBC 8.2 06/10/2023   HGB 14.5 06/10/2023   HCT 42.8 06/10/2023   MCV 99.3 06/10/2023   PLT 235 06/10/2023   NEUTROABS 5.1 06/08/2023    Imaging:  No results found.  Medications: I have reviewed the patient's current medications.  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  Duodenitis.  Plastic biliary and pancreatic stents noted at the major papilla.  Pancreatic stent removed. Cycle 1 gemcitabine/cisplatin/Durvalumab 06/20/2023 Biliary obstruction secondary #1, status post placement of right and left hepatic duct stents 05/06/2023 Prostate cancer, status post prostatectomy in 2018,pT3a,pN0, Gleason 7 COPD Hypertension Hyperlipidemia Tobacco use  Disposition: Robert [name] appears stable.  He is scheduled to begin treatment with gemcitabine/cisplatin/Durvalumab on 06/20/2023.  We again reviewed potential toxicities.  He agrees to proceed.  We will see him in follow-up prior to the day 8 treatment.  He will contact the office in the interim with any problems.    Diana Forster ANP/GNP-BC   06/17/2023  3:15 PM

## 2023-06-17 NOTE — Telephone Encounter (Signed)
 Blood was drawn and order was placed for [facility] on 06/17/2023.   Lylianna Fraiser, MS, [facility] Genetic Counselor [email or web address] (P) [phone]

## 2023-06-17 NOTE — Progress Notes (Unsigned)
Genetic orders placed

## 2023-06-18 ENCOUNTER — Ambulatory Visit: Payer: Self-pay | Admitting: Gastroenterology

## 2023-06-18 DIAGNOSIS — A419 Sepsis, unspecified organism: Secondary | ICD-10-CM | POA: Diagnosis not present

## 2023-06-18 DIAGNOSIS — J189 Pneumonia, unspecified organism: Secondary | ICD-10-CM | POA: Diagnosis not present

## 2023-06-18 DIAGNOSIS — J441 Chronic obstructive pulmonary disease with (acute) exacerbation: Secondary | ICD-10-CM | POA: Diagnosis not present

## 2023-06-19 ENCOUNTER — Other Ambulatory Visit: Payer: Self-pay | Admitting: Oncology

## 2023-06-19 DIAGNOSIS — C240 Malignant neoplasm of extrahepatic bile duct: Secondary | ICD-10-CM

## 2023-06-20 ENCOUNTER — Encounter (HOSPITAL_COMMUNITY): Payer: Self-pay | Admitting: Gastroenterology

## 2023-06-20 ENCOUNTER — Inpatient Hospital Stay

## 2023-06-20 ENCOUNTER — Encounter: Payer: Self-pay | Admitting: Oncology

## 2023-06-20 VITALS — BP 145/76 | HR 68 | Temp 98.3°F | Resp 16

## 2023-06-20 DIAGNOSIS — E785 Hyperlipidemia, unspecified: Secondary | ICD-10-CM | POA: Diagnosis not present

## 2023-06-20 DIAGNOSIS — C61 Malignant neoplasm of prostate: Secondary | ICD-10-CM | POA: Diagnosis not present

## 2023-06-20 DIAGNOSIS — J449 Chronic obstructive pulmonary disease, unspecified: Secondary | ICD-10-CM | POA: Diagnosis not present

## 2023-06-20 DIAGNOSIS — D696 Thrombocytopenia, unspecified: Secondary | ICD-10-CM | POA: Diagnosis not present

## 2023-06-20 DIAGNOSIS — Z79899 Other long term (current) drug therapy: Secondary | ICD-10-CM | POA: Diagnosis not present

## 2023-06-20 DIAGNOSIS — I10 Essential (primary) hypertension: Secondary | ICD-10-CM | POA: Diagnosis not present

## 2023-06-20 DIAGNOSIS — C221 Intrahepatic bile duct carcinoma: Secondary | ICD-10-CM | POA: Diagnosis not present

## 2023-06-20 DIAGNOSIS — Z5111 Encounter for antineoplastic chemotherapy: Secondary | ICD-10-CM | POA: Diagnosis not present

## 2023-06-20 DIAGNOSIS — C240 Malignant neoplasm of extrahepatic bile duct: Secondary | ICD-10-CM

## 2023-06-20 DIAGNOSIS — K831 Obstruction of bile duct: Secondary | ICD-10-CM | POA: Diagnosis not present

## 2023-06-20 LAB — TSH: TSH: 0.565 u[IU]/mL (ref 0.350–4.500)

## 2023-06-20 LAB — MAGNESIUM: Magnesium: 2.8 mg/dL — ABNORMAL HIGH (ref 1.7–2.4)

## 2023-06-20 MED ORDER — SODIUM CHLORIDE 0.9 % IV SOLN
2200.0000 mg | Freq: Once | INTRAVENOUS | Status: AC
  Administered 2023-06-20: 2200 mg via INTRAVENOUS
  Filled 2023-06-20: qty 52.57

## 2023-06-20 MED ORDER — ACETAMINOPHEN 325 MG PO TABS
650.0000 mg | ORAL_TABLET | Freq: Once | ORAL | Status: AC
  Administered 2023-06-20: 650 mg via ORAL
  Filled 2023-06-20: qty 2

## 2023-06-20 MED ORDER — DEXAMETHASONE SODIUM PHOSPHATE 10 MG/ML IJ SOLN
10.0000 mg | Freq: Once | INTRAMUSCULAR | Status: AC
  Administered 2023-06-20: 10 mg via INTRAVENOUS
  Filled 2023-06-20: qty 1

## 2023-06-20 MED ORDER — SODIUM CHLORIDE 0.9% FLUSH
10.0000 mL | INTRAVENOUS | Status: DC | PRN
  Administered 2023-06-20: 10 mL

## 2023-06-20 MED ORDER — HEPARIN SOD (PORK) LOCK FLUSH 100 UNIT/ML IV SOLN
500.0000 [IU] | Freq: Once | INTRAVENOUS | Status: AC | PRN
  Administered 2023-06-20: 500 [IU]

## 2023-06-20 MED ORDER — PALONOSETRON HCL INJECTION 0.25 MG/5ML
0.2500 mg | Freq: Once | INTRAVENOUS | Status: AC
  Administered 2023-06-20: 0.25 mg via INTRAVENOUS
  Filled 2023-06-20: qty 5

## 2023-06-20 MED ORDER — SODIUM CHLORIDE 0.9 % IV SOLN
1500.0000 mg | Freq: Once | INTRAVENOUS | Status: AC
  Administered 2023-06-20: 1500 mg via INTRAVENOUS
  Filled 2023-06-20: qty 30

## 2023-06-20 MED ORDER — POTASSIUM CHLORIDE IN NACL 20-0.9 MEQ/L-% IV SOLN
Freq: Once | INTRAVENOUS | Status: AC
  Filled 2023-06-20: qty 1000

## 2023-06-20 MED ORDER — SODIUM CHLORIDE 0.9 % IV SOLN
150.0000 mg | Freq: Once | INTRAVENOUS | Status: AC
  Administered 2023-06-20: 150 mg via INTRAVENOUS
  Filled 2023-06-20: qty 150

## 2023-06-20 MED ORDER — SODIUM CHLORIDE 0.9 % IV SOLN
25.0000 mg/m2 | Freq: Once | INTRAVENOUS | Status: AC
  Administered 2023-06-20: 50 mg via INTRAVENOUS
  Filled 2023-06-20: qty 50

## 2023-06-20 MED ORDER — SODIUM CHLORIDE 0.9 % IV SOLN: INTRAVENOUS | Status: DC

## 2023-06-20 MED ORDER — MAGNESIUM SULFATE 2 GM/50ML IV SOLN
2.0000 g | Freq: Once | INTRAVENOUS | Status: AC
  Administered 2023-06-20: 2 g via INTRAVENOUS
  Filled 2023-06-20: qty 50

## 2023-06-20 NOTE — Progress Notes (Signed)
 Per Dr. Scherrie Curt, okay to proceed with treatment without magnesium  and thyroid  panel results. RN to draw baseline labs in infusion prior to D1C1 treatment start.

## 2023-06-20 NOTE — Patient Instructions (Signed)
 [facility] - A DEPT OF [facility]  Discharge Instructions: Thank you for choosing [facility] to provide your oncology and hematology care.   If you have a lab appointment with the Cancer Center, please go directly to the Cancer Center and check in at the registration area.   Wear comfortable clothing and clothing appropriate for easy access to any Portacath or PICC line.   We strive to give you quality time with your provider. You may need to reschedule your appointment if you arrive late (15 or more minutes).  Arriving late affects you and other patients whose appointments are after yours.  Also, if you miss three or more appointments without notifying the office, you may be dismissed from the clinic at the provider's discretion.      For prescription refill requests, have your pharmacy contact our office and allow 72 hours for refills to be completed.    Today you received the following chemotherapy and/or immunotherapy agents IMFINZI/GEMZAR/CISPLATIN      To help prevent nausea and vomiting after your treatment, we encourage you to take your nausea medication as directed.  BELOW ARE SYMPTOMS THAT SHOULD BE REPORTED IMMEDIATELY: *FEVER GREATER THAN 100.4 F (38 C) OR HIGHER *CHILLS OR SWEATING *NAUSEA AND VOMITING THAT IS NOT CONTROLLED WITH YOUR NAUSEA MEDICATION *UNUSUAL SHORTNESS OF BREATH *UNUSUAL BRUISING OR BLEEDING *URINARY PROBLEMS (pain or burning when urinating, or frequent urination) *BOWEL PROBLEMS (unusual diarrhea, constipation, pain near the anus) TENDERNESS IN MOUTH AND THROAT WITH OR WITHOUT PRESENCE OF ULCERS (sore throat, sores in mouth, or a toothache) UNUSUAL RASH, SWELLING OR PAIN  UNUSUAL VAGINAL DISCHARGE OR ITCHING   Items with * indicate a potential emergency and should be followed up as soon as possible or go to the Emergency Department if any problems should occur.  Please show the CHEMOTHERAPY ALERT CARD or  IMMUNOTHERAPY ALERT CARD at check-in to the Emergency Department and triage nurse.  Should you have questions after your visit or need to cancel or reschedule your appointment, please contact [facility]  Dept: [phone]  and follow the prompts.  Office hours are 8:00 a.m. to 4:30 p.m. Monday - Friday. Please note that voicemails left after 4:00 p.m. may not be returned until the following business day.  We are closed weekends and major holidays. You have access to a nurse at all times for urgent questions. Please call the main number to the clinic Dept: [phone] and follow the prompts.   For any non-urgent questions, you may also contact your provider using MyChart. We now offer e-Visits for anyone 65 and older to request care online for non-urgent symptoms. For details visit [email or web address].   Also download the MyChart app! Go to the app store, search "MyChart", open the app, select [facility], and log in with your MyChart username and password.  Durvalumab Injection What is this medication? DURVALUMAB (dur VAL ue mab) treats some types of cancer. It works by helping your immune system slow or stop the spread of cancer cells. It is a monoclonal antibody. This medicine may be used for other purposes; ask your health care provider or pharmacist if you have questions. COMMON BRAND NAME(S): IMFINZI What should I tell my care team before I take this medication? They need to know if you have any of these conditions: Allogeneic stem cell transplant (uses someone else's stem cells) Autoimmune diseases, such as Crohn  disease, ulcerative colitis, lupus History of chest radiation Nervous system problems, such as Guillain-Barre syndrome, myasthenia gravis Organ transplant An unusual or allergic reaction to durvalumab, other medications, foods, dyes, or preservatives Pregnant or trying to get pregnant Breast-feeding How should I use  this medication? This medication is infused into a vein. It is given by your care team in a hospital or clinic setting. A special MedGuide will be given to you before each treatment. Be sure to read this information carefully each time. Talk to your care team about the use of this medication in children. Special care may be needed. Overdosage: If you think you have taken too much of this medicine contact a poison control center or emergency room at once. NOTE: This medicine is only for you. Do not share this medicine with others. What if I miss a dose? Keep appointments for follow-up doses. It is important not to miss your dose. Call your care team if you are unable to keep an appointment. What may interact with this medication? Interactions have not been studied. This list may not describe all possible interactions. Give your health care provider a list of all the medicines, herbs, non-prescription drugs, or dietary supplements you use. Also tell them if you smoke, drink alcohol, or use illegal drugs. Some items may interact with your medicine. What should I watch for while using this medication? Your condition will be monitored carefully while you are receiving this medication. You may need blood work while taking this medication. This medication may cause serious skin reactions. They can happen weeks to months after starting the medication. Contact your care team right away if you notice fevers or flu-like symptoms with a rash. The rash may be red or purple and then turn into blisters or peeling of the skin. You may also notice a red rash with swelling of the face, lips, or lymph nodes in your neck or under your arms. Tell your care team right away if you have any change in your eyesight. Talk to your care team if you may be pregnant. Serious birth defects can occur if you take this medication during pregnancy and for 3 months after the last dose. You will need a negative pregnancy test before  starting this medication. Contraception is recommended while taking this medication and for 3 months after the last dose. Your care team can help you find the option that works for you. Do not breastfeed while taking this medication and for 3 months after the last dose. What side effects may I notice from receiving this medication? Side effects that you should report to your care team as soon as possible: Allergic reactions--skin rash, itching, hives, swelling of the face, lips, tongue, or throat Dry cough, shortness of breath or trouble breathing Eye pain, redness, irritation, or discharge with blurry or decreased vision Heart muscle inflammation--unusual weakness or fatigue, shortness of breath, chest pain, fast or irregular heartbeat, dizziness, swelling of the ankles, feet, or hands Hormone gland problems--headache, sensitivity to light, unusual weakness or fatigue, dizziness, fast or irregular heartbeat, increased sensitivity to cold or heat, excessive sweating, constipation, hair loss, increased thirst or amount of urine, tremors or shaking, irritability Infusion reactions--chest pain, shortness of breath or trouble breathing, feeling faint or lightheaded Kidney injury (glomerulonephritis)--decrease in the amount of urine, red or dark brown urine, foamy or bubbly urine, swelling of the ankles, hands, or feet Liver injury--right upper belly pain, loss of appetite, nausea, light-colored stool, dark yellow or brown urine, yellowing skin  or eyes, unusual weakness or fatigue Pain, tingling, or numbness in the hands or feet, muscle weakness, change in vision, confusion or trouble speaking, loss of balance or coordination, trouble walking, seizures Rash, fever, and swollen lymph nodes Redness, blistering, peeling, or loosening of the skin, including inside the mouth Sudden or severe stomach pain, bloody diarrhea, fever, nausea, vomiting Side effects that usually do not require medical attention  (report these to your care team if they continue or are bothersome): Bone, joint, or muscle pain Diarrhea Fatigue Loss of appetite Nausea Skin rash This list may not describe all possible side effects. Call your doctor for medical advice about side effects. You may report side effects to FDA at 1-800-FDA-1088. Where should I keep my medication? This medication is given in a hospital or clinic. It will not be stored at home. NOTE: This sheet is a summary. It may not cover all possible information. If you have questions about this medicine, talk to your doctor, pharmacist, or health care provider.  2024 Elsevier/Gold Standard (2021-05-12 00:00:00)  Gemcitabine Injection What is this medication? GEMCITABINE (jem SYE ta been) treats some types of cancer. It works by slowing down the growth of cancer cells. This medicine may be used for other purposes; ask your health care provider or pharmacist if you have questions. COMMON BRAND NAME(S): Gemzar, Infugem What should I tell my care team before I take this medication? They need to know if you have any of these conditions: Blood disorders Infection Kidney disease Liver disease Lung or breathing disease, such as asthma or COPD Recent or ongoing radiation therapy An unusual or allergic reaction to gemcitabine, other medications, foods, dyes, or preservatives If you or your partner are pregnant or trying to get pregnant Breast-feeding How should I use this medication? This medication is injected into a vein. It is given by your care team in a hospital or clinic setting. Talk to your care team about the use of this medication in children. Special care may be needed. Overdosage: If you think you have taken too much of this medicine contact a poison control center or emergency room at once. NOTE: This medicine is only for you. Do not share this medicine with others. What if I miss a dose? Keep appointments for follow-up doses. It is important  not to miss your dose. Call your care team if you are unable to keep an appointment. What may interact with this medication? Interactions have not been studied. This list may not describe all possible interactions. Give your health care provider a list of all the medicines, herbs, non-prescription drugs, or dietary supplements you use. Also tell them if you smoke, drink alcohol, or use illegal drugs. Some items may interact with your medicine. What should I watch for while using this medication? Your condition will be monitored carefully while you are receiving this medication. This medication may make you feel generally unwell. This is not uncommon, as chemotherapy can affect healthy cells as well as cancer cells. Report any side effects. Continue your course of treatment even though you feel ill unless your care team tells you to stop. In some cases, you may be given additional medications to help with side effects. Follow all directions for their use. This medication may increase your risk of getting an infection. Call your care team for advice if you get a fever, chills, sore throat, or other symptoms of a cold or flu. Do not treat yourself. Try to avoid being around people who are sick.  This medication may increase your risk to bruise or bleed. Call your care team if you notice any unusual bleeding. Be careful brushing or flossing your teeth or using a toothpick because you may get an infection or bleed more easily. If you have any dental work done, tell your dentist you are receiving this medication. Avoid taking medications that contain aspirin, acetaminophen, ibuprofen, naproxen, or ketoprofen unless instructed by your care team. These medications may hide a fever. Talk to your care team if you or your partner wish to become pregnant or think you might be pregnant. This medication can cause serious birth defects if taken during pregnancy and for 6 months after the last dose. A negative pregnancy  test is required before starting this medication. A reliable form of contraception is recommended while taking this medication and for 6 months after the last dose. Talk to your care team about effective forms of contraception. Do not father a child while taking this medication and for 3 months after the last dose. Use a condom while having sex during this time period. Do not breastfeed while taking this medication and for at least 1 week after the last dose. This medication may cause infertility. Talk to your care team if you are concerned about your fertility. What side effects may I notice from receiving this medication? Side effects that you should report to your care team as soon as possible: Allergic reactions--skin rash, itching, hives, swelling of the face, lips, tongue, or throat Capillary leak syndrome--stomach or muscle pain, unusual weakness or fatigue, feeling faint or lightheaded, decrease in the amount of urine, swelling of the ankles, hands, or feet, trouble breathing Infection--fever, chills, cough, sore throat, wounds that don't heal, pain or trouble when passing urine, general feeling of discomfort or being unwell Liver injury--right upper belly pain, loss of appetite, nausea, light-colored stool, dark yellow or brown urine, yellowing skin or eyes, unusual weakness or fatigue Low red blood cell level--unusual weakness or fatigue, dizziness, headache, trouble breathing Lung injury--shortness of breath or trouble breathing, cough, spitting up blood, chest pain, fever Stomach pain, bloody diarrhea, pale skin, unusual weakness or fatigue, decrease in the amount of urine, which may be signs of hemolytic uremic syndrome Sudden and severe headache, confusion, change in vision, seizures, which may be signs of posterior reversible encephalopathy syndrome (PRES) Unusual bruising or bleeding Side effects that usually do not require medical attention (report to your care team if they continue or  are bothersome): Diarrhea Drowsiness Hair loss Nausea Pain, redness, or swelling with sores inside the mouth or throat Vomiting This list may not describe all possible side effects. Call your doctor for medical advice about side effects. You may report side effects to FDA at 1-800-FDA-1088. Where should I keep my medication? This medication is given in a hospital or clinic. It will not be stored at home. NOTE: This sheet is a summary. It may not cover all possible information. If you have questions about this medicine, talk to your doctor, pharmacist, or health care provider.  2024 Elsevier/Gold Standard (2021-05-05 00:00:00)  Cisplatin Injection What is this medication? CISPLATIN (SIS pla tin) treats some types of cancer. It works by slowing down the growth of cancer cells. This medicine may be used for other purposes; ask your health care provider or pharmacist if you have questions. COMMON BRAND NAME(S): Platinol, Platinol -AQ What should I tell my care team before I take this medication? They need to know if you have any of these conditions:  Eye disease, vision problems Hearing problems Kidney disease Low blood counts, such as low white cells, platelets, or red blood cells Tingling of the fingers or toes, or other nerve disorder An unusual or allergic reaction to cisplatin, carboplatin, oxaliplatin, other medications, foods, dyes, or preservatives If you or your partner are pregnant or trying to get pregnant Breast-feeding How should I use this medication? This medication is injected into a vein. It is given by your care team in a hospital or clinic setting. Talk to your care team about the use of this medication in children. Special care may be needed. Overdosage: If you think you have taken too much of this medicine contact a poison control center or emergency room at once. NOTE: This medicine is only for you. Do not share this medicine with others. What if I miss a  dose? Keep appointments for follow-up doses. It is important not to miss your dose. Call your care team if you are unable to keep an appointment. What may interact with this medication? Do not take this medication with any of the following: Live virus vaccines This medication may also interact with the following: Certain antibiotics, such as amikacin, gentamicin, neomycin, polymyxin B, streptomycin, tobramycin, vancomycin Foscarnet This list may not describe all possible interactions. Give your health care provider a list of all the medicines, herbs, non-prescription drugs, or dietary supplements you use. Also tell them if you smoke, drink alcohol, or use illegal drugs. Some items may interact with your medicine. What should I watch for while using this medication? Your condition will be monitored carefully while you are receiving this medication. You may need blood work done while taking this medication. This medication may make you feel generally unwell. This is not uncommon, as chemotherapy can affect healthy cells as well as cancer cells. Report any side effects. Continue your course of treatment even though you feel ill unless your care team tells you to stop. This medication may increase your risk of getting an infection. Call your care team for advice if you get a fever, chills, sore throat, or other symptoms of a cold or flu. Do not treat yourself. Try to avoid being around people who are sick. Avoid taking medications that contain aspirin, acetaminophen, ibuprofen, naproxen, or ketoprofen unless instructed by your care team. These medications may hide a fever. This medication may increase your risk to bruise or bleed. Call your care team if you notice any unusual bleeding. Be careful brushing or flossing your teeth or using a toothpick because you may get an infection or bleed more easily. If you have any dental work done, tell your dentist you are receiving this medication. Drink fluids as  directed while you are taking this medication. This will help protect your kidneys. Call your care team if you get diarrhea. Do not treat yourself. Talk to your care team if you or your partner wish to become pregnant or think you might be pregnant. This medication can cause serious birth defects if taken during pregnancy and for 14 months after the last dose. A negative pregnancy test is required before starting this medication. A reliable form of contraception is recommended while taking this medication and for 14 months after the last dose. Talk to your care team about effective forms of contraception. Do not father a child while taking this medication and for 11 months after the last dose. Use a condom during sex during this time period. Do not breast-feed while taking this medication. This medication may  cause infertility. Talk to your care team if you are concerned about your fertility. What side effects may I notice from receiving this medication? Side effects that you should report to your care team as soon as possible: Allergic reactions--skin rash, itching, hives, swelling of the face, lips, tongue, or throat Eye pain, change in vision, vision loss Hearing loss, ringing in ears Infection--fever, chills, cough, sore throat, wounds that don't heal, pain or trouble when passing urine, general feeling of discomfort or being unwell Kidney injury--decrease in the amount of urine, swelling of the ankles, hands, or feet Low red blood cell level--unusual weakness or fatigue, dizziness, headache, trouble breathing Painful swelling, warmth, or redness of the skin, blisters or sores at the infusion site Pain, tingling, or numbness in the hands or feet Unusual bruising or bleeding Side effects that usually do not require medical attention (report to your care team if they continue or are bothersome): Hair loss Nausea Vomiting This list may not describe all possible side effects. Call your doctor for  medical advice about side effects. You may report side effects to FDA at 1-800-FDA-1088. Where should I keep my medication? This medication is given in a hospital or clinic. It will not be stored at home. NOTE: This sheet is a summary. It may not cover all possible information. If you have questions about this medicine, talk to your doctor, pharmacist, or health care provider.  2024 Elsevier/Gold Standard (2021-05-01 00:00:00)

## 2023-06-20 NOTE — Progress Notes (Signed)
Treatment given per orders. Patient tolerated it well without problems. Vitals stable and discharged home from clinic via wheelchair Follow up as scheduled.  

## 2023-06-21 ENCOUNTER — Telehealth: Payer: Self-pay

## 2023-06-21 ENCOUNTER — Other Ambulatory Visit: Payer: Self-pay

## 2023-06-21 LAB — T4: T4, Total: 9.6 ug/dL (ref 4.5–12.0)

## 2023-06-21 NOTE — Telephone Encounter (Signed)
 24 Hour Call Back Reached out to patient regarding his first treatment 06/20/2023. Patient stated he felt good, no side effects present at this time. Educated patient to call if he has any questions or concerns.

## 2023-06-23 ENCOUNTER — Other Ambulatory Visit

## 2023-06-23 ENCOUNTER — Ambulatory Visit: Admitting: Oncology

## 2023-06-23 DIAGNOSIS — C240 Malignant neoplasm of extrahepatic bile duct: Secondary | ICD-10-CM | POA: Diagnosis not present

## 2023-06-24 ENCOUNTER — Ambulatory Visit

## 2023-06-27 ENCOUNTER — Ambulatory Visit: Admitting: Nurse Practitioner

## 2023-06-27 ENCOUNTER — Other Ambulatory Visit: Payer: Self-pay | Admitting: Oncology

## 2023-06-27 ENCOUNTER — Inpatient Hospital Stay

## 2023-06-27 ENCOUNTER — Encounter: Payer: Self-pay | Admitting: Nurse Practitioner

## 2023-06-27 ENCOUNTER — Inpatient Hospital Stay: Admitting: Nurse Practitioner

## 2023-06-27 ENCOUNTER — Other Ambulatory Visit

## 2023-06-27 ENCOUNTER — Other Ambulatory Visit: Payer: Self-pay | Admitting: *Deleted

## 2023-06-27 VITALS — BP 125/72 | HR 77 | Temp 97.8°F | Resp 18 | Ht 66.0 in | Wt 215.8 lb

## 2023-06-27 DIAGNOSIS — C240 Malignant neoplasm of extrahepatic bile duct: Secondary | ICD-10-CM

## 2023-06-27 DIAGNOSIS — C61 Malignant neoplasm of prostate: Secondary | ICD-10-CM | POA: Diagnosis not present

## 2023-06-27 DIAGNOSIS — Z5111 Encounter for antineoplastic chemotherapy: Secondary | ICD-10-CM | POA: Diagnosis not present

## 2023-06-27 DIAGNOSIS — E785 Hyperlipidemia, unspecified: Secondary | ICD-10-CM | POA: Diagnosis not present

## 2023-06-27 DIAGNOSIS — C221 Intrahepatic bile duct carcinoma: Secondary | ICD-10-CM | POA: Diagnosis not present

## 2023-06-27 DIAGNOSIS — D696 Thrombocytopenia, unspecified: Secondary | ICD-10-CM | POA: Diagnosis not present

## 2023-06-27 DIAGNOSIS — Z79899 Other long term (current) drug therapy: Secondary | ICD-10-CM | POA: Diagnosis not present

## 2023-06-27 DIAGNOSIS — K831 Obstruction of bile duct: Secondary | ICD-10-CM | POA: Diagnosis not present

## 2023-06-27 DIAGNOSIS — I10 Essential (primary) hypertension: Secondary | ICD-10-CM | POA: Diagnosis not present

## 2023-06-27 DIAGNOSIS — J449 Chronic obstructive pulmonary disease, unspecified: Secondary | ICD-10-CM | POA: Diagnosis not present

## 2023-06-27 LAB — CBC WITH DIFFERENTIAL (CANCER CENTER ONLY)
Abs Immature Granulocytes: 0 10*3/uL (ref 0.00–0.07)
Basophils Absolute: 0.1 10*3/uL (ref 0.0–0.1)
Basophils Relative: 1 %
Eosinophils Absolute: 0.1 10*3/uL (ref 0.0–0.5)
Eosinophils Relative: 3 %
HCT: 38.6 % — ABNORMAL LOW (ref 39.0–52.0)
Hemoglobin: 13.1 g/dL (ref 13.0–17.0)
Immature Granulocytes: 0 %
Lymphocytes Relative: 60 %
Lymphs Abs: 2.6 10*3/uL (ref 0.7–4.0)
MCH: 33.7 pg (ref 26.0–34.0)
MCHC: 33.9 g/dL (ref 30.0–36.0)
MCV: 99.2 fL (ref 80.0–100.0)
Monocytes Absolute: 0.2 10*3/uL (ref 0.1–1.0)
Monocytes Relative: 4 %
Neutro Abs: 1.4 10*3/uL — ABNORMAL LOW (ref 1.7–7.7)
Neutrophils Relative %: 32 %
Platelet Count: 140 10*3/uL — ABNORMAL LOW (ref 150–400)
RBC: 3.89 MIL/uL — ABNORMAL LOW (ref 4.22–5.81)
RDW: 12.5 % (ref 11.5–15.5)
WBC Count: 4.3 10*3/uL (ref 4.0–10.5)
nRBC: 0 % (ref 0.0–0.2)

## 2023-06-27 LAB — CMP (CANCER CENTER ONLY)
ALT: 62 U/L — ABNORMAL HIGH (ref 0–44)
AST: 35 U/L (ref 15–41)
Albumin: 3.8 g/dL (ref 3.5–5.0)
Alkaline Phosphatase: 174 U/L — ABNORMAL HIGH (ref 38–126)
Anion gap: 11 (ref 5–15)
BUN: 13 mg/dL (ref 8–23)
CO2: 27 mmol/L (ref 22–32)
Calcium: 9.6 mg/dL (ref 8.9–10.3)
Chloride: 97 mmol/L — ABNORMAL LOW (ref 98–111)
Creatinine: 0.58 mg/dL — ABNORMAL LOW (ref 0.61–1.24)
GFR, Estimated: >60 mL/min (ref >60)
Glucose, Bld: 109 mg/dL — ABNORMAL HIGH (ref 70–99)
Potassium: 3.8 mmol/L (ref 3.5–5.1)
Sodium: 135 mmol/L (ref 135–145)
Total Bilirubin: 0.5 mg/dL (ref 0.0–1.2)
Total Protein: 6.7 g/dL (ref 6.5–8.1)

## 2023-06-27 LAB — MAGNESIUM: Magnesium: 1.7 mg/dL (ref 1.7–2.4)

## 2023-06-27 NOTE — Progress Notes (Addendum)
 Robert [name] OFFICE PROGRESS NOTE   Diagnosis: Extrahepatic cholangiocarcinoma  INTERVAL HISTORY:   Robert [name] returns as scheduled.  He completed cycle 1 day 1 gemcitabine /cisplatin /Durvalumab  06/20/2023.  He denies nausea/vomiting.  No mouth sores.  No diarrhea.  No fever or rash after treatment.  No cough.  He reports a brief episode of dyspnea at some point during his treatment on 06/20/2023.  He is not sure when this occurred.  The dyspnea resolved spontaneously.  He had no other associated symptoms such as chest pain, throat tightness, cough, rash.  Objective:  Vital signs in last 24 hours:  Blood pressure 125/72, pulse 77, temperature 97.8 F (36.6 C), temperature source Temporal, resp. rate 18, height 5' 6 (1.676 m), weight 215 lb 12.8 oz (97.9 kg), SpO2 96%.    HEENT: No thrush or ulcers. Resp: Lungs clear bilaterally. Cardio: Regular rate and rhythm. GI: No hepatosplenomegaly. Vascular: No leg edema. Skin: No rash. Port-A-Cath without erythema.  Lab Results:  Lab Results  Component Value Date   WBC 4.3 06/27/2023   HGB 13.1 06/27/2023   HCT 38.6 (L) 06/27/2023   MCV 99.2 06/27/2023   PLT 140 (L) 06/27/2023   NEUTROABS 1.4 (L) 06/27/2023    Imaging:  No results found.  Medications: I have reviewed the patient's current medications.  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  Duodenitis.  Plastic biliary and pancreatic stents noted at the major papilla.  Pancreatic stent removed. Cycle 1 gemcitabine /cisplatin /Durvalumab  06/20/2023, day 8 06/28/2023 Biliary obstruction secondary #1, status post placement of right and left hepatic duct stents 05/06/2023 Prostate cancer, status post prostatectomy in 2018,pT3a,pN0, Gleason 7 COPD Hypertension Hyperlipidemia Tobacco use  Disposition: Robert [name] appears stable.  He completed cycle 1 day 1 gemcitabine /cisplatin /Durvalumab  06/20/2023.  Overall he seems to have tolerated well.  He had an episode of dyspnea at some point during the treatment on 06/20/2023.  The dyspnea resolved spontaneously.  There were no associated symptoms.  He understands to alert the nurse immediately if he develops dyspnea/other symptoms during treatment in the future.    Plan to proceed with cycle 1 day 8 gemcitabine /cisplatin  as scheduled 06/28/2023.  CBC and chemistry panel reviewed.  Labs are adequate for treatment.  Gemcitabine  will be dose reduced due to mild thrombocytopenia.  He has mild neutropenia.  He will receive white cell growth factor support on 06/30/2023.  We again reviewed potential side effects including bone pain, rash, splenic rupture.  He agrees with the above plan.  Neutropenic and thrombocytopenic precautions reviewed.  He understands to contact the office with fever, chills, other signs of infection, bleeding.  He will return for follow-up and cycle 2 gemcitabine /cisplatin /Durvalumab  in 2 weeks.  We are available to see him sooner if needed.  Diana Forster  ANP/GNP-BC   06/27/2023  11:24 AM

## 2023-06-28 ENCOUNTER — Inpatient Hospital Stay

## 2023-06-28 ENCOUNTER — Other Ambulatory Visit

## 2023-06-28 ENCOUNTER — Other Ambulatory Visit: Payer: Self-pay

## 2023-06-28 VITALS — BP 154/77 | HR 58 | Temp 97.9°F | Resp 18

## 2023-06-28 DIAGNOSIS — Z79899 Other long term (current) drug therapy: Secondary | ICD-10-CM | POA: Diagnosis not present

## 2023-06-28 DIAGNOSIS — K831 Obstruction of bile duct: Secondary | ICD-10-CM | POA: Diagnosis not present

## 2023-06-28 DIAGNOSIS — Z5111 Encounter for antineoplastic chemotherapy: Secondary | ICD-10-CM | POA: Diagnosis not present

## 2023-06-28 DIAGNOSIS — I10 Essential (primary) hypertension: Secondary | ICD-10-CM | POA: Diagnosis not present

## 2023-06-28 DIAGNOSIS — C240 Malignant neoplasm of extrahepatic bile duct: Secondary | ICD-10-CM

## 2023-06-28 DIAGNOSIS — C221 Intrahepatic bile duct carcinoma: Secondary | ICD-10-CM | POA: Diagnosis not present

## 2023-06-28 DIAGNOSIS — J449 Chronic obstructive pulmonary disease, unspecified: Secondary | ICD-10-CM | POA: Diagnosis not present

## 2023-06-28 DIAGNOSIS — E785 Hyperlipidemia, unspecified: Secondary | ICD-10-CM | POA: Diagnosis not present

## 2023-06-28 DIAGNOSIS — D696 Thrombocytopenia, unspecified: Secondary | ICD-10-CM | POA: Diagnosis not present

## 2023-06-28 DIAGNOSIS — C61 Malignant neoplasm of prostate: Secondary | ICD-10-CM | POA: Diagnosis not present

## 2023-06-28 MED ORDER — POTASSIUM CHLORIDE IN NACL 20-0.9 MEQ/L-% IV SOLN
Freq: Once | INTRAVENOUS | Status: AC
  Filled 2023-06-28: qty 1000

## 2023-06-28 MED ORDER — SODIUM CHLORIDE 0.9 % IV SOLN
25.0000 mg/m2 | Freq: Once | INTRAVENOUS | Status: AC
  Administered 2023-06-28: 50 mg via INTRAVENOUS
  Filled 2023-06-28: qty 50

## 2023-06-28 MED ORDER — SODIUM CHLORIDE 0.9 % IV SOLN
150.0000 mg | Freq: Once | INTRAVENOUS | Status: AC
  Administered 2023-06-28: 150 mg via INTRAVENOUS
  Filled 2023-06-28: qty 150

## 2023-06-28 MED ORDER — SODIUM CHLORIDE 0.9 % IV SOLN
800.0000 mg/m2 | Freq: Once | INTRAVENOUS | Status: AC
  Administered 2023-06-28: 1710 mg via INTRAVENOUS
  Filled 2023-06-28: qty 25.98

## 2023-06-28 MED ORDER — SODIUM CHLORIDE 0.9 % IV SOLN: INTRAVENOUS | Status: DC

## 2023-06-28 MED ORDER — MAGNESIUM SULFATE 2 GM/50ML IV SOLN
2.0000 g | Freq: Once | INTRAVENOUS | Status: AC
  Administered 2023-06-28: 2 g via INTRAVENOUS
  Filled 2023-06-28: qty 50

## 2023-06-28 MED ORDER — DEXAMETHASONE SODIUM PHOSPHATE 10 MG/ML IJ SOLN
10.0000 mg | Freq: Once | INTRAMUSCULAR | Status: AC
  Administered 2023-06-28: 10 mg via INTRAVENOUS
  Filled 2023-06-28: qty 1

## 2023-06-28 MED ORDER — SODIUM CHLORIDE 0.9% FLUSH
10.0000 mL | INTRAVENOUS | Status: DC | PRN
  Administered 2023-06-28: 10 mL

## 2023-06-28 MED ORDER — PALONOSETRON HCL INJECTION 0.25 MG/5ML
0.2500 mg | Freq: Once | INTRAVENOUS | Status: AC
  Administered 2023-06-28: 0.25 mg via INTRAVENOUS
  Filled 2023-06-28: qty 5

## 2023-06-28 MED ORDER — HEPARIN SOD (PORK) LOCK FLUSH 100 UNIT/ML IV SOLN
500.0000 [IU] | Freq: Once | INTRAVENOUS | Status: AC | PRN
  Administered 2023-06-28: 500 [IU]

## 2023-06-28 NOTE — Patient Instructions (Signed)
 [facility] - A DEPT OF [facility]   Discharge Instructions: Thank you for choosing [facility] to provide your oncology and hematology care.   If you have a lab appointment with the Cancer Center, please go directly to the Cancer Center and check in at the registration area.   Wear comfortable clothing and clothing appropriate for easy access to any Portacath or PICC line.   We strive to give you quality time with your provider. You may need to reschedule your appointment if you arrive late (15 or more minutes).  Arriving late affects you and other patients whose appointments are after yours.  Also, if you miss three or more appointments without notifying the office, you may be dismissed from the clinic at the provider's discretion.      For prescription refill requests, have your pharmacy contact our office and allow 72 hours for refills to be completed.    Today you received the following chemotherapy and/or immunotherapy agents :GEMZAR /CISPLATIN .      To help prevent nausea and vomiting after your treatment, we encourage you to take your nausea medication as directed.  BELOW ARE SYMPTOMS THAT SHOULD BE REPORTED IMMEDIATELY: *FEVER GREATER THAN 100.4 F (38 C) OR HIGHER *CHILLS OR SWEATING *NAUSEA AND VOMITING THAT IS NOT CONTROLLED WITH YOUR NAUSEA MEDICATION *UNUSUAL SHORTNESS OF BREATH *UNUSUAL BRUISING OR BLEEDING *URINARY PROBLEMS (pain or burning when urinating, or frequent urination) *BOWEL PROBLEMS (unusual diarrhea, constipation, pain near the anus) TENDERNESS IN MOUTH AND THROAT WITH OR WITHOUT PRESENCE OF ULCERS (sore throat, sores in mouth, or a toothache) UNUSUAL RASH, SWELLING OR PAIN  UNUSUAL VAGINAL DISCHARGE OR ITCHING   Items with * indicate a potential emergency and should be followed up as soon as possible or go to the Emergency Department if any problems should occur.  Please show the CHEMOTHERAPY ALERT CARD or  IMMUNOTHERAPY ALERT CARD at check-in to the Emergency Department and triage nurse.  Should you have questions after your visit or need to cancel or reschedule your appointment, please contact [facility]  Dept: [phone]  and follow the prompts.  Office hours are 8:00 a.m. to 4:30 p.m. Monday - Friday. Please note that voicemails left after 4:00 p.m. may not be returned until the following business day.  We are closed weekends and major holidays. You have access to a nurse at all times for urgent questions. Please call the main number to the clinic Dept: [phone] and follow the prompts.   For any non-urgent questions, you may also contact your provider using MyChart. We now offer e-Visits for anyone 32 and older to request care online for non-urgent symptoms. For details visit [email or web address].   Also download the MyChart app! Go to the app store, search MyChart, open the app, select , and log in with your MyChart username and password.

## 2023-06-28 NOTE — Progress Notes (Signed)
 Ok to treat w/ anc 1.4 per Lisa's note from 6/16 Plan to proceed with cycle 1 day 8 gemcitabine /cisplatin  as scheduled 06/28/2023. CBC and chemistry panel reviewed. Labs are adequate for treatment. Gemcitabine  will be dose reduced due to mild thrombocytopenia. He has mild neutropenia. He will receive white cell growth factor support on 06/30/2023.   12:30 - ok to run post cisplatin  fluids concurrently w/ cisplatin  per Dr. Scherrie Curt

## 2023-06-29 ENCOUNTER — Inpatient Hospital Stay

## 2023-06-29 ENCOUNTER — Other Ambulatory Visit: Payer: Self-pay

## 2023-06-29 VITALS — BP 127/98 | HR 72 | Temp 98.7°F | Resp 18

## 2023-06-29 DIAGNOSIS — C221 Intrahepatic bile duct carcinoma: Secondary | ICD-10-CM | POA: Diagnosis not present

## 2023-06-29 DIAGNOSIS — C240 Malignant neoplasm of extrahepatic bile duct: Secondary | ICD-10-CM

## 2023-06-29 DIAGNOSIS — K831 Obstruction of bile duct: Secondary | ICD-10-CM | POA: Diagnosis not present

## 2023-06-29 DIAGNOSIS — E785 Hyperlipidemia, unspecified: Secondary | ICD-10-CM | POA: Diagnosis not present

## 2023-06-29 DIAGNOSIS — Z5111 Encounter for antineoplastic chemotherapy: Secondary | ICD-10-CM | POA: Diagnosis not present

## 2023-06-29 DIAGNOSIS — I10 Essential (primary) hypertension: Secondary | ICD-10-CM | POA: Diagnosis not present

## 2023-06-29 DIAGNOSIS — C61 Malignant neoplasm of prostate: Secondary | ICD-10-CM | POA: Diagnosis not present

## 2023-06-29 DIAGNOSIS — Z79899 Other long term (current) drug therapy: Secondary | ICD-10-CM | POA: Diagnosis not present

## 2023-06-29 DIAGNOSIS — D696 Thrombocytopenia, unspecified: Secondary | ICD-10-CM | POA: Diagnosis not present

## 2023-06-29 DIAGNOSIS — J449 Chronic obstructive pulmonary disease, unspecified: Secondary | ICD-10-CM | POA: Diagnosis not present

## 2023-06-29 MED ORDER — PEGFILGRASTIM-JMDB 6 MG/0.6ML ~~LOC~~ SOSY
6.0000 mg | PREFILLED_SYRINGE | Freq: Once | SUBCUTANEOUS | Status: AC
  Administered 2023-06-29: 6 mg via SUBCUTANEOUS

## 2023-06-29 NOTE — Patient Instructions (Signed)
 [facility]  Discharge Instructions: Thank you for choosing [facility] to provide your oncology and hematology care.   If you have a lab appointment with the Cancer Center, please go directly to the Cancer Center and check in at the registration area.   Wear comfortable clothing and clothing appropriate for easy access to any Portacath or PICC line.   We strive to give you quality time with your provider. You may need to reschedule your appointment if you arrive late (15 or more minutes).  Arriving late affects you and other patients whose appointments are after yours.  Also, if you miss three or more appointments without notifying the office, you may be dismissed from the clinic at the provider's discretion.      For prescription refill requests, have your pharmacy contact our office and allow 72 hours for refills to be completed.    Today you received the following Fulphila.  Pegfilgrastim Injection What is this medication? PEGFILGRASTIM (PEG fil gra stim) lowers the risk of infection in people who are receiving chemotherapy. It works by Systems analyst make more white blood cells, which protects your body from infection. It may also be used to help people who have been exposed to high doses of radiation. This medicine may be used for other purposes; ask your health care provider or pharmacist if you have questions. COMMON BRAND NAME(S): Cherly Hensen, Neulasta, Nyvepria, Stimufend, UDENYCA, UDENYCA ONBODY, Ziextenzo What should I tell my care team before I take this medication? They need to know if you have any of these conditions: Kidney disease Latex allergy Ongoing radiation therapy Sickle cell disease Skin reactions to acrylic adhesives (On-Body Injector only) An unusual or allergic reaction to pegfilgrastim, filgrastim, other medications, foods, dyes, or preservatives Pregnant or trying to get  pregnant Breast-feeding How should I use this medication? This medication is for injection under the skin. If you get this medication at home, you will be taught how to prepare and give the pre-filled syringe or how to use the On-body Injector. Refer to the patient Instructions for Use for detailed instructions. Use exactly as directed. Tell your care team immediately if you suspect that the On-body Injector may not have performed as intended or if you suspect the use of the On-body Injector resulted in a missed or partial dose. It is important that you put your used needles and syringes in a special sharps container. Do not put them in a trash can. If you do not have a sharps container, call your pharmacist or care team to get one. Talk to your care team about the use of this medication in children. While this medication may be prescribed for selected conditions, precautions do apply. Overdosage: If you think you have taken too much of this medicine contact a poison control center or emergency room at once. NOTE: This medicine is only for you. Do not share this medicine with others. What if I miss a dose? It is important not to miss your dose. Call your care team if you miss your dose. If you miss a dose due to an On-body Injector failure or leakage, a new dose should be administered as soon as possible using a single prefilled syringe for manual use. What may interact with this medication? Interactions have not been studied. This list may not describe all possible interactions. Give your health care provider a list of all the medicines, herbs, non-prescription drugs,  or dietary supplements you use. Also tell them if you smoke, drink alcohol, or use illegal drugs. Some items may interact with your medicine. What should I watch for while using this medication? Your condition will be monitored carefully while you are receiving this medication. You may need blood work done while you are taking this  medication. Talk to your care team about your risk of cancer. You may be more at risk for certain types of cancer if you take this medication. If you are going to need a MRI, CT scan, or other procedure, tell your care team that you are using this medication (On-Body Injector only). What side effects may I notice from receiving this medication? Side effects that you should report to your care team as soon as possible: Allergic reactions--skin rash, itching, hives, swelling of the face, lips, tongue, or throat Capillary leak syndrome--stomach or muscle pain, unusual weakness or fatigue, feeling faint or lightheaded, decrease in the amount of urine, swelling of the ankles, hands, or feet, trouble breathing High white blood cell level--fever, fatigue, trouble breathing, night sweats, change in vision, weight loss Inflammation of the aorta--fever, fatigue, back, chest, or stomach pain, severe headache Kidney injury (glomerulonephritis)--decrease in the amount of urine, red or dark brown urine, foamy or bubbly urine, swelling of the ankles, hands, or feet Shortness of breath or trouble breathing Spleen injury--pain in upper left stomach or shoulder Unusual bruising or bleeding Side effects that usually do not require medical attention (report to your care team if they continue or are bothersome): Bone pain Pain in the hands or feet This list may not describe all possible side effects. Call your doctor for medical advice about side effects. You may report side effects to FDA at 1-800-FDA-1088. Where should I keep my medication? Keep out of the reach of children. If you are using this medication at home, you will be instructed on how to store it. Throw away any unused medication after the expiration date on the label. NOTE: This sheet is a summary. It may not cover all possible information. If you have questions about this medicine, talk to your doctor, pharmacist, or health care provider.  2024  Elsevier/Gold Standard (2020-11-28 00:00:00)      To help prevent nausea and vomiting after your treatment, we encourage you to take your nausea medication as directed.  BELOW ARE SYMPTOMS THAT SHOULD BE REPORTED IMMEDIATELY: *FEVER GREATER THAN 100.4 F (38 C) OR HIGHER *CHILLS OR SWEATING *NAUSEA AND VOMITING THAT IS NOT CONTROLLED WITH YOUR NAUSEA MEDICATION *UNUSUAL SHORTNESS OF BREATH *UNUSUAL BRUISING OR BLEEDING *URINARY PROBLEMS (pain or burning when urinating, or frequent urination) *BOWEL PROBLEMS (unusual diarrhea, constipation, pain near the anus) TENDERNESS IN MOUTH AND THROAT WITH OR WITHOUT PRESENCE OF ULCERS (sore throat, sores in mouth, or a toothache) UNUSUAL RASH, SWELLING OR PAIN  UNUSUAL VAGINAL DISCHARGE OR ITCHING   Items with * indicate a potential emergency and should be followed up as soon as possible or go to the Emergency Department if any problems should occur.  Please show the CHEMOTHERAPY ALERT CARD or IMMUNOTHERAPY ALERT CARD at check-in to the Emergency Department and triage nurse.  Should you have questions after your visit or need to cancel or reschedule your appointment, please contact [facility]  Dept: [phone]  and follow the prompts.  Office hours are 8:00 a.m. to 4:30 p.m. Monday - Friday. Please note that voicemails left after 4:00 p.m. may  not be returned until the following business day.  We are closed weekends and major holidays. You have access to a nurse at all times for urgent questions. Please call the main number to the clinic Dept: [phone] and follow the prompts.   For any non-urgent questions, you may also contact your provider using MyChart. We now offer e-Visits for anyone 27 and older to request care online for non-urgent symptoms. For details visit [email or web address].   Also download the MyChart app! Go to the app store, search "MyChart", open the app, select [facility], and log in with your MyChart username and password.

## 2023-06-30 ENCOUNTER — Ambulatory Visit

## 2023-07-01 ENCOUNTER — Encounter: Payer: Self-pay | Admitting: Genetic Counselor

## 2023-07-01 ENCOUNTER — Telehealth: Payer: Self-pay | Admitting: Genetic Counselor

## 2023-07-01 DIAGNOSIS — Z1379 Encounter for other screening for genetic and chromosomal anomalies: Secondary | ICD-10-CM | POA: Insufficient documentation

## 2023-07-01 DIAGNOSIS — Z1501 Genetic susceptibility to malignant neoplasm of breast: Secondary | ICD-10-CM | POA: Insufficient documentation

## 2023-07-01 NOTE — Telephone Encounter (Signed)
 A single pathogenic variant was identified in the BRCA2 gene. Of note, a variant of uncertain significance was identified in the RAD51C gene. Diana Forster will disclose his results and place a referral to meet with a genetic counselor to discuss the results in more detail.  The test report has been scanned into EPIC and is located under the Molecular Pathology section of the Results Review tab.  A portion of the result report is included below for reference.   Goldman Birchall, MS, [facility] Genetic Counselor [email or web address] (P) [phone]

## 2023-07-11 ENCOUNTER — Other Ambulatory Visit

## 2023-07-11 ENCOUNTER — Ambulatory Visit: Admitting: Nurse Practitioner

## 2023-07-12 ENCOUNTER — Ambulatory Visit

## 2023-07-13 ENCOUNTER — Other Ambulatory Visit: Payer: Self-pay

## 2023-07-13 ENCOUNTER — Inpatient Hospital Stay: Admitting: Nurse Practitioner

## 2023-07-13 ENCOUNTER — Inpatient Hospital Stay: Attending: Oncology

## 2023-07-13 ENCOUNTER — Inpatient Hospital Stay

## 2023-07-13 ENCOUNTER — Encounter: Payer: Self-pay | Admitting: Genetic Counselor

## 2023-07-13 ENCOUNTER — Encounter: Payer: Self-pay | Admitting: Nurse Practitioner

## 2023-07-13 VITALS — BP 146/82 | HR 88 | Temp 98.1°F | Resp 18 | Ht 66.0 in | Wt 212.0 lb

## 2023-07-13 DIAGNOSIS — C61 Malignant neoplasm of prostate: Secondary | ICD-10-CM | POA: Insufficient documentation

## 2023-07-13 DIAGNOSIS — C221 Intrahepatic bile duct carcinoma: Secondary | ICD-10-CM | POA: Insufficient documentation

## 2023-07-13 DIAGNOSIS — Z5111 Encounter for antineoplastic chemotherapy: Secondary | ICD-10-CM | POA: Insufficient documentation

## 2023-07-13 DIAGNOSIS — Z95828 Presence of other vascular implants and grafts: Secondary | ICD-10-CM

## 2023-07-13 DIAGNOSIS — C240 Malignant neoplasm of extrahepatic bile duct: Secondary | ICD-10-CM | POA: Insufficient documentation

## 2023-07-13 DIAGNOSIS — R1031 Right lower quadrant pain: Secondary | ICD-10-CM | POA: Insufficient documentation

## 2023-07-13 DIAGNOSIS — Z79899 Other long term (current) drug therapy: Secondary | ICD-10-CM | POA: Diagnosis not present

## 2023-07-13 DIAGNOSIS — E785 Hyperlipidemia, unspecified: Secondary | ICD-10-CM | POA: Diagnosis not present

## 2023-07-13 DIAGNOSIS — I10 Essential (primary) hypertension: Secondary | ICD-10-CM | POA: Diagnosis not present

## 2023-07-13 DIAGNOSIS — J449 Chronic obstructive pulmonary disease, unspecified: Secondary | ICD-10-CM | POA: Diagnosis not present

## 2023-07-13 DIAGNOSIS — D696 Thrombocytopenia, unspecified: Secondary | ICD-10-CM | POA: Diagnosis not present

## 2023-07-13 LAB — CBC WITH DIFFERENTIAL (CANCER CENTER ONLY)
Abs Immature Granulocytes: 0.24 10*3/uL — ABNORMAL HIGH (ref 0.00–0.07)
Basophils Absolute: 0.1 10*3/uL (ref 0.0–0.1)
Basophils Relative: 1 %
Eosinophils Absolute: 0.1 10*3/uL (ref 0.0–0.5)
Eosinophils Relative: 1 %
HCT: 41.6 % (ref 39.0–52.0)
Hemoglobin: 14.2 g/dL (ref 13.0–17.0)
Immature Granulocytes: 2 %
Lymphocytes Relative: 17 %
Lymphs Abs: 2.3 10*3/uL (ref 0.7–4.0)
MCH: 33.6 pg (ref 26.0–34.0)
MCHC: 34.1 g/dL (ref 30.0–36.0)
MCV: 98.3 fL (ref 80.0–100.0)
Monocytes Absolute: 1.9 10*3/uL — ABNORMAL HIGH (ref 0.1–1.0)
Monocytes Relative: 14 %
Neutro Abs: 8.8 10*3/uL — ABNORMAL HIGH (ref 1.7–7.7)
Neutrophils Relative %: 65 %
Platelet Count: 350 10*3/uL (ref 150–400)
RBC: 4.23 MIL/uL (ref 4.22–5.81)
RDW: 13.7 % (ref 11.5–15.5)
WBC Count: 13.5 10*3/uL — ABNORMAL HIGH (ref 4.0–10.5)
nRBC: 0 % (ref 0.0–0.2)

## 2023-07-13 LAB — CMP (CANCER CENTER ONLY)
ALT: 23 U/L (ref 0–44)
AST: 23 U/L (ref 15–41)
Albumin: 4.1 g/dL (ref 3.5–5.0)
Alkaline Phosphatase: 124 U/L (ref 38–126)
Anion gap: 11 (ref 5–15)
BUN: 14 mg/dL (ref 8–23)
CO2: 26 mmol/L (ref 22–32)
Calcium: 9.8 mg/dL (ref 8.9–10.3)
Chloride: 98 mmol/L (ref 98–111)
Creatinine: 0.72 mg/dL (ref 0.61–1.24)
GFR, Estimated: >60 mL/min (ref >60)
Glucose, Bld: 102 mg/dL — ABNORMAL HIGH (ref 70–99)
Potassium: 4 mmol/L (ref 3.5–5.1)
Sodium: 134 mmol/L — ABNORMAL LOW (ref 135–145)
Total Bilirubin: 0.4 mg/dL (ref 0.0–1.2)
Total Protein: 7.2 g/dL (ref 6.5–8.1)

## 2023-07-13 LAB — MAGNESIUM: Magnesium: 1.7 mg/dL (ref 1.7–2.4)

## 2023-07-13 MED ORDER — HEPARIN SOD (PORK) LOCK FLUSH 100 UNIT/ML IV SOLN
500.0000 [IU] | Freq: Once | INTRAVENOUS | Status: AC
  Administered 2023-07-13: 500 [IU] via INTRAVENOUS

## 2023-07-13 MED ORDER — SODIUM CHLORIDE 0.9% FLUSH
10.0000 mL | Freq: Once | INTRAVENOUS | Status: AC
  Administered 2023-07-13: 10 mL via INTRAVENOUS

## 2023-07-13 NOTE — Progress Notes (Addendum)
 [facility] OFFICE PROGRESS NOTE   Diagnosis: Extrahepatic cholangiocarcinoma  INTERVAL HISTORY:   Robert [name] returns as scheduled.  He completed cycle 1 day 8 gemcitabine /cisplatin  06/28/2023 Gemcitabine  was dose reduced due to thrombocytopenia.  He denies nausea/pain.  No mouth sores.  No diarrhea.  No rash.  He denies fever.  No numbness or tingling in the hands or feet.  No decrease in hearing.  He is fatigued for a few days after each treatment.  No bone pain following Fulphila .  This week he has noted mild lower abdominal and low back discomfort.  He wonders if he strained a muscle at work.    Objective:  Vital signs in last 24 hours:  Blood pressure (!) 146/82, pulse 88, temperature 98.1 F (36.7 C), temperature source Temporal, resp. rate 18, height 5' 6 (1.676 m), weight 212 lb (96.2 kg), SpO2 96%.    HEENT: No thrush or ulcers. Resp: Lungs clear bilaterally. Cardio: Regular rate and rhythm. GI: Abdomen soft and nontender.  No hepatosplenomegaly. Vascular: No leg edema. Skin: No rash. Port-A-Cath without erythema.  Lab Results:  Lab Results  Component Value Date   WBC 13.5 (H) 07/13/2023   HGB 14.2 07/13/2023   HCT 41.6 07/13/2023   MCV 98.3 07/13/2023   PLT 350 07/13/2023   NEUTROABS 8.8 (H) 07/13/2023    Imaging:  No results found.  Medications: I have reviewed the patient's current medications.  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  Duodenitis.  Plastic biliary and pancreatic stents noted at the major papilla.  Pancreatic stent removed. Cycle 1 gemcitabine /cisplatin /Durvalumab  06/20/2023, day 8 06/28/2023 (Gemcitabine  dose reduced due to thrombocytopenia), Fulphila  Cycle 2 gemcitabine /cisplatin /Durvalumab  07/14/2023 Biliary obstruction secondary #1, status post placement of right and left hepatic duct stents 05/06/2023 Prostate cancer, status post prostatectomy in 2018,pT3a,pN0, Gleason 7 COPD Hypertension Hyperlipidemia Tobacco use BRCA2 gene mutation-referred to genetics 07/13/2023  Disposition: [name] appears stable.  He has completed 1 cycle of gemcitabine /cisplatin /Durvalumab .  So far tolerating treatment well.  Plan to proceed with cycle 2-day 1 as scheduled 07/14/2023.  CBC and chemistry panel reviewed.  Labs adequate for treatment.  We discussed the BRCA2 gene mutation.  Referral placed to genetics.  He will return for follow-up in 1 week.  We are available to see him sooner if needed.  Patient seen with Dr. Cloretta.    [name] ANP/GNP-BC   07/13/2023  3:13 PM  This was a shared visit with [name].  Robert [name] tolerated the first cycle of gemcitabine /cisplatin  well.  He will begin cycle 2 tomorrow. He has been diagnosed with a germline BRCA2 mutation.  We discussed the implications of the 2 diagnosis with him.  He will be referred to the genetics counselor.  He will alert  his family members so they can receive appropriate testing.  Arvella Hof, MD

## 2023-07-14 ENCOUNTER — Encounter: Payer: Self-pay | Admitting: Oncology

## 2023-07-14 ENCOUNTER — Inpatient Hospital Stay

## 2023-07-14 ENCOUNTER — Other Ambulatory Visit: Payer: Self-pay | Admitting: Nurse Practitioner

## 2023-07-14 VITALS — BP 116/66 | HR 73 | Temp 97.6°F | Resp 18

## 2023-07-14 DIAGNOSIS — C61 Malignant neoplasm of prostate: Secondary | ICD-10-CM | POA: Diagnosis not present

## 2023-07-14 DIAGNOSIS — R1031 Right lower quadrant pain: Secondary | ICD-10-CM | POA: Diagnosis not present

## 2023-07-14 DIAGNOSIS — J449 Chronic obstructive pulmonary disease, unspecified: Secondary | ICD-10-CM | POA: Diagnosis not present

## 2023-07-14 DIAGNOSIS — C221 Intrahepatic bile duct carcinoma: Secondary | ICD-10-CM | POA: Diagnosis not present

## 2023-07-14 DIAGNOSIS — I10 Essential (primary) hypertension: Secondary | ICD-10-CM | POA: Diagnosis not present

## 2023-07-14 DIAGNOSIS — Z5111 Encounter for antineoplastic chemotherapy: Secondary | ICD-10-CM | POA: Diagnosis not present

## 2023-07-14 DIAGNOSIS — Z79899 Other long term (current) drug therapy: Secondary | ICD-10-CM | POA: Diagnosis not present

## 2023-07-14 DIAGNOSIS — C240 Malignant neoplasm of extrahepatic bile duct: Secondary | ICD-10-CM | POA: Diagnosis not present

## 2023-07-14 DIAGNOSIS — D696 Thrombocytopenia, unspecified: Secondary | ICD-10-CM | POA: Diagnosis not present

## 2023-07-14 DIAGNOSIS — E785 Hyperlipidemia, unspecified: Secondary | ICD-10-CM | POA: Diagnosis not present

## 2023-07-14 MED ORDER — SODIUM CHLORIDE 0.9 % IV SOLN
1000.0000 mg/m2 | Freq: Once | INTRAVENOUS | Status: AC
  Administered 2023-07-14: 2128 mg via INTRAVENOUS
  Filled 2023-07-14: qty 51.97

## 2023-07-14 MED ORDER — POTASSIUM CHLORIDE IN NACL 20-0.9 MEQ/L-% IV SOLN
Freq: Once | INTRAVENOUS | Status: AC
  Filled 2023-07-14: qty 1000

## 2023-07-14 MED ORDER — PALONOSETRON HCL INJECTION 0.25 MG/5ML
0.2500 mg | Freq: Once | INTRAVENOUS | Status: AC
  Administered 2023-07-14: 0.25 mg via INTRAVENOUS
  Filled 2023-07-14: qty 5

## 2023-07-14 MED ORDER — DEXAMETHASONE SODIUM PHOSPHATE 10 MG/ML IJ SOLN
10.0000 mg | Freq: Once | INTRAMUSCULAR | Status: AC
  Administered 2023-07-14: 10 mg via INTRAVENOUS
  Filled 2023-07-14: qty 1

## 2023-07-14 MED ORDER — HEPARIN SOD (PORK) LOCK FLUSH 100 UNIT/ML IV SOLN
500.0000 [IU] | Freq: Once | INTRAVENOUS | Status: AC | PRN
  Administered 2023-07-14: 500 [IU]

## 2023-07-14 MED ORDER — SODIUM CHLORIDE 0.9 % IV SOLN
150.0000 mg | Freq: Once | INTRAVENOUS | Status: AC
  Administered 2023-07-14: 150 mg via INTRAVENOUS
  Filled 2023-07-14: qty 150

## 2023-07-14 MED ORDER — SODIUM CHLORIDE 0.9% FLUSH
10.0000 mL | INTRAVENOUS | Status: DC | PRN
  Administered 2023-07-14: 10 mL

## 2023-07-14 MED ORDER — SODIUM CHLORIDE 0.9 % IV SOLN
1500.0000 mg | Freq: Once | INTRAVENOUS | Status: AC
  Administered 2023-07-14: 1500 mg via INTRAVENOUS
  Filled 2023-07-14: qty 30

## 2023-07-14 MED ORDER — SODIUM CHLORIDE 0.9 % IV SOLN
25.0000 mg/m2 | Freq: Once | INTRAVENOUS | Status: AC
  Administered 2023-07-14: 50 mg via INTRAVENOUS
  Filled 2023-07-14: qty 50

## 2023-07-14 MED ORDER — SODIUM CHLORIDE 0.9 % IV SOLN: INTRAVENOUS | Status: DC

## 2023-07-14 MED ORDER — MAGNESIUM SULFATE 2 GM/50ML IV SOLN
2.0000 g | Freq: Once | INTRAVENOUS | Status: AC
  Administered 2023-07-14: 2 g via INTRAVENOUS
  Filled 2023-07-14: qty 50

## 2023-07-14 NOTE — Progress Notes (Signed)
 Per [name] resume full dose gemzar  with GF support

## 2023-07-14 NOTE — Patient Instructions (Signed)
 [facility] - A DEPT OF [facility]  Discharge Instructions: Thank you for choosing [facility] to provide your oncology and hematology care.   If you have a lab appointment with the Cancer Center, please go directly to the Cancer Center and check in at the registration area.   Wear comfortable clothing and clothing appropriate for easy access to any Portacath or PICC line.   We strive to give you quality time with your provider. You may need to reschedule your appointment if you arrive late (15 or more minutes).  Arriving late affects you and other patients whose appointments are after yours.  Also, if you miss three or more appointments without notifying the office, you may be dismissed from the clinic at the provider's discretion.      For prescription refill requests, have your pharmacy contact our office and allow 72 hours for refills to be completed.    Today you received the following chemotherapy and/or immunotherapy agents Imfinzi , Gemzar , and Cisplatin       To help prevent nausea and vomiting after your treatment, we encourage you to take your nausea medication as directed.  BELOW ARE SYMPTOMS THAT SHOULD BE REPORTED IMMEDIATELY: *FEVER GREATER THAN 100.4 F (38 C) OR HIGHER *CHILLS OR SWEATING *NAUSEA AND VOMITING THAT IS NOT CONTROLLED WITH YOUR NAUSEA MEDICATION *UNUSUAL SHORTNESS OF BREATH *UNUSUAL BRUISING OR BLEEDING *URINARY PROBLEMS (pain or burning when urinating, or frequent urination) *BOWEL PROBLEMS (unusual diarrhea, constipation, pain near the anus) TENDERNESS IN MOUTH AND THROAT WITH OR WITHOUT PRESENCE OF ULCERS (sore throat, sores in mouth, or a toothache) UNUSUAL RASH, SWELLING OR PAIN  UNUSUAL VAGINAL DISCHARGE OR ITCHING   Items with * indicate a potential emergency and should be followed up as soon as possible or go to the Emergency Department if any problems should occur.  Please show the CHEMOTHERAPY ALERT  CARD or IMMUNOTHERAPY ALERT CARD at check-in to the Emergency Department and triage nurse.  Should you have questions after your visit or need to cancel or reschedule your appointment, please contact [facility]  Dept: [phone]  and follow the prompts.  Office hours are 8:00 a.m. to 4:30 p.m. Monday - Friday. Please note that voicemails left after 4:00 p.m. may not be returned until the following business day.  We are closed weekends and major holidays. You have access to a nurse at all times for urgent questions. Please call the main number to the clinic Dept: [phone] and follow the prompts.   For any non-urgent questions, you may also contact your provider using MyChart. We now offer e-Visits for anyone 12 and older to request care online for non-urgent symptoms. For details visit [email or web address].   Also download the MyChart app! Go to the app store, search MyChart, open the app, select [place], and log in with your MyChart username and password.

## 2023-07-14 NOTE — Progress Notes (Signed)
 Per [name], NP: Genna to run post hydration fluid with Cisplatin  infusion today. Please always check with provider for an okay to do this with every treatment.

## 2023-07-18 DIAGNOSIS — J189 Pneumonia, unspecified organism: Secondary | ICD-10-CM | POA: Diagnosis not present

## 2023-07-18 DIAGNOSIS — A419 Sepsis, unspecified organism: Secondary | ICD-10-CM | POA: Diagnosis not present

## 2023-07-18 DIAGNOSIS — J441 Chronic obstructive pulmonary disease with (acute) exacerbation: Secondary | ICD-10-CM | POA: Diagnosis not present

## 2023-07-19 ENCOUNTER — Other Ambulatory Visit

## 2023-07-19 ENCOUNTER — Ambulatory Visit: Admitting: Nurse Practitioner

## 2023-07-20 ENCOUNTER — Ambulatory Visit

## 2023-07-20 ENCOUNTER — Inpatient Hospital Stay

## 2023-07-20 ENCOUNTER — Ambulatory Visit (HOSPITAL_BASED_OUTPATIENT_CLINIC_OR_DEPARTMENT_OTHER)
Admission: RE | Admit: 2023-07-20 | Discharge: 2023-07-20 | Disposition: A | Discharge status: Home / Self Care | Source: Ambulatory Visit | Attending: Nurse Practitioner | Admitting: Nurse Practitioner

## 2023-07-20 ENCOUNTER — Inpatient Hospital Stay: Admitting: Nurse Practitioner

## 2023-07-20 ENCOUNTER — Encounter: Payer: Self-pay | Admitting: Nurse Practitioner

## 2023-07-20 VITALS — BP 137/85 | HR 69 | Temp 98.0°F | Resp 18 | Ht 66.0 in | Wt 211.6 lb

## 2023-07-20 DIAGNOSIS — C240 Malignant neoplasm of extrahepatic bile duct: Secondary | ICD-10-CM

## 2023-07-20 DIAGNOSIS — R1031 Right lower quadrant pain: Secondary | ICD-10-CM

## 2023-07-20 DIAGNOSIS — C61 Malignant neoplasm of prostate: Secondary | ICD-10-CM | POA: Diagnosis not present

## 2023-07-20 DIAGNOSIS — N200 Calculus of kidney: Secondary | ICD-10-CM | POA: Diagnosis not present

## 2023-07-20 DIAGNOSIS — R932 Abnormal findings on diagnostic imaging of liver and biliary tract: Secondary | ICD-10-CM | POA: Diagnosis not present

## 2023-07-20 DIAGNOSIS — J449 Chronic obstructive pulmonary disease, unspecified: Secondary | ICD-10-CM | POA: Diagnosis not present

## 2023-07-20 DIAGNOSIS — D696 Thrombocytopenia, unspecified: Secondary | ICD-10-CM | POA: Diagnosis not present

## 2023-07-20 DIAGNOSIS — R160 Hepatomegaly, not elsewhere classified: Secondary | ICD-10-CM | POA: Diagnosis not present

## 2023-07-20 DIAGNOSIS — I10 Essential (primary) hypertension: Secondary | ICD-10-CM | POA: Diagnosis not present

## 2023-07-20 DIAGNOSIS — E785 Hyperlipidemia, unspecified: Secondary | ICD-10-CM | POA: Diagnosis not present

## 2023-07-20 DIAGNOSIS — Z5111 Encounter for antineoplastic chemotherapy: Secondary | ICD-10-CM | POA: Diagnosis not present

## 2023-07-20 DIAGNOSIS — Z79899 Other long term (current) drug therapy: Secondary | ICD-10-CM | POA: Diagnosis not present

## 2023-07-20 DIAGNOSIS — C221 Intrahepatic bile duct carcinoma: Secondary | ICD-10-CM | POA: Diagnosis not present

## 2023-07-20 DIAGNOSIS — Z95828 Presence of other vascular implants and grafts: Secondary | ICD-10-CM

## 2023-07-20 LAB — CBC WITH DIFFERENTIAL (CANCER CENTER ONLY)
Abs Immature Granulocytes: 0.02 K/uL (ref 0.00–0.07)
Basophils Absolute: 0.1 K/uL (ref 0.0–0.1)
Basophils Relative: 1 %
Eosinophils Absolute: 0 K/uL (ref 0.0–0.5)
Eosinophils Relative: 1 %
HCT: 41 % (ref 39.0–52.0)
Hemoglobin: 13.9 g/dL (ref 13.0–17.0)
Immature Granulocytes: 0 %
Lymphocytes Relative: 21 %
Lymphs Abs: 1.6 K/uL (ref 0.7–4.0)
MCH: 33.1 pg (ref 26.0–34.0)
MCHC: 33.9 g/dL (ref 30.0–36.0)
MCV: 97.6 fL (ref 80.0–100.0)
Monocytes Absolute: 0.3 K/uL (ref 0.1–1.0)
Monocytes Relative: 4 %
Neutro Abs: 5.6 K/uL (ref 1.7–7.7)
Neutrophils Relative %: 73 %
Platelet Count: 225 K/uL (ref 150–400)
RBC: 4.2 MIL/uL — ABNORMAL LOW (ref 4.22–5.81)
RDW: 13.2 % (ref 11.5–15.5)
WBC Count: 7.7 K/uL (ref 4.0–10.5)
nRBC: 0 % (ref 0.0–0.2)

## 2023-07-20 LAB — CMP (CANCER CENTER ONLY)
ALT: 48 U/L — ABNORMAL HIGH (ref 0–44)
AST: 42 U/L — ABNORMAL HIGH (ref 15–41)
Albumin: 3.9 g/dL (ref 3.5–5.0)
Alkaline Phosphatase: 119 U/L (ref 38–126)
Anion gap: 10 (ref 5–15)
BUN: 10 mg/dL (ref 8–23)
CO2: 27 mmol/L (ref 22–32)
Calcium: 9.5 mg/dL (ref 8.9–10.3)
Chloride: 97 mmol/L — ABNORMAL LOW (ref 98–111)
Creatinine: 0.59 mg/dL — ABNORMAL LOW (ref 0.61–1.24)
GFR, Estimated: >60 mL/min (ref >60)
Glucose, Bld: 138 mg/dL — ABNORMAL HIGH (ref 70–99)
Potassium: 3.9 mmol/L (ref 3.5–5.1)
Sodium: 134 mmol/L — ABNORMAL LOW (ref 135–145)
Total Bilirubin: 0.6 mg/dL (ref 0.0–1.2)
Total Protein: 6.9 g/dL (ref 6.5–8.1)

## 2023-07-20 LAB — MAGNESIUM: Magnesium: 1.7 mg/dL (ref 1.7–2.4)

## 2023-07-20 MED ORDER — HEPARIN SOD (PORK) LOCK FLUSH 100 UNIT/ML IV SOLN
500.0000 [IU] | Freq: Once | INTRAVENOUS | Status: AC
  Administered 2023-07-20: 500 [IU] via INTRAVENOUS

## 2023-07-20 MED ORDER — SODIUM CHLORIDE 0.9% FLUSH
10.0000 mL | Freq: Once | INTRAVENOUS | Status: AC
  Administered 2023-07-20: 10 mL via INTRAVENOUS

## 2023-07-20 NOTE — Progress Notes (Addendum)
 [facility] OFFICE PROGRESS NOTE   Diagnosis: Extrahepatic cholangiocarcinoma  INTERVAL HISTORY:   [name] returns as scheduled.  He completed cycle 2-day 1 gemcitabine /cisplatin /Durvalumab  07/14/2023.  He denies nausea/vomiting.  No mouth sores.  No diarrhea.  No rash.  No numbness or tingling in the hands or feet.  No hearing loss.  No ringing in ears.  He reports continued pain at the right lower abdomen.  The pain worsens as the day progresses.  He also notes an increase in pain when he coughs or sneezes.  At times he feels the pain in his back as well.  No fever.  No urinary symptoms.  No bleeding.  Objective:  Vital signs in last 24 hours:  Blood pressure 137/85, pulse 69, temperature 98 F (36.7 C), temperature source Temporal, resp. rate 18, height 5' 6 (1.676 m), weight 211 lb 9.6 oz (96 kg), SpO2 100%.    HEENT: No thrush or ulcers. Resp: Lungs clear bilaterally. Cardio: Regular rate and rhythm. GI: Abdomen soft.  No hepatomegaly.  Tender at the right lower abdomen with deep palpation. Vascular: No leg edema. Skin: No rash. Port-A-Cath without erythema.  Lab Results:  Lab Results  Component Value Date   WBC 7.7 07/20/2023   HGB 13.9 07/20/2023   HCT 41.0 07/20/2023   MCV 97.6 07/20/2023   PLT 225 07/20/2023   NEUTROABS 5.6 07/20/2023    Imaging:  No results found.  Medications: I have reviewed the patient's current medications.  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  Duodenitis.  Plastic biliary and pancreatic stents noted at the major papilla.  Pancreatic stent removed. 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    Disposition: Robert [name] appears stable.  He completed cycle 2-day 1 gemcitabine /cisplatin /Durvalumab  07/14/2023.  He is tolerating treatment well.  Plan to proceed with day 8 gemcitabine /cisplatin  as scheduled 07/21/2023.  CBC and chemistry panel reviewed.  Labs adequate for treatment.  He has been experiencing recent right lower quadrant pain.  The pain has worsened over the past several days.  He is tender on exam.  We are referring him for stat CT abdomen/pelvis.  He is well-appearing at today's visit.  Current plan is to proceed with treatment as scheduled 07/21/2023.  He understands this could change if there are any concerning CT findings.  He is scheduled to return for follow-up in 2 weeks.  We are available to see him sooner if needed.  Patient seen with Dr. Cloretta.    [name]  ANP/GNP-BC   07/20/2023  9:10 AM  This was a shared visit with [name].  [name] was interviewed and examined.  He reports discomfort in the right lower abdomen and right low back for the past week.  No associated symptoms.  He was referred for a CT Abdo/pelvis today.  There is abnormal fluid density at the junction of the descending and transverse duodenum in the area of biliary stents.  The significance of this finding is unclear.  I will contact Dr. Wilhelmenia to get his opinion regarding Mr. Derrig's symptoms and the CT findings.  I was present for greater than 50% of today's visit.  I performed medical decision making.  Arvella Cloretta, MD  Addendum 4:26 PM-I reviewed CT findings with Mr. Villwock by phone.  He will keep appointments as scheduled tomorrow.  We will follow-up with Dr. Wilhelmenia.

## 2023-07-20 NOTE — Patient Instructions (Signed)

## 2023-07-21 ENCOUNTER — Other Ambulatory Visit: Payer: Self-pay | Admitting: Nurse Practitioner

## 2023-07-21 ENCOUNTER — Inpatient Hospital Stay

## 2023-07-21 VITALS — BP 146/78 | HR 66 | Temp 98.2°F | Resp 18

## 2023-07-21 DIAGNOSIS — C61 Malignant neoplasm of prostate: Secondary | ICD-10-CM | POA: Diagnosis not present

## 2023-07-21 DIAGNOSIS — C240 Malignant neoplasm of extrahepatic bile duct: Secondary | ICD-10-CM | POA: Diagnosis not present

## 2023-07-21 DIAGNOSIS — Z79899 Other long term (current) drug therapy: Secondary | ICD-10-CM | POA: Diagnosis not present

## 2023-07-21 DIAGNOSIS — J449 Chronic obstructive pulmonary disease, unspecified: Secondary | ICD-10-CM | POA: Diagnosis not present

## 2023-07-21 DIAGNOSIS — Z5111 Encounter for antineoplastic chemotherapy: Secondary | ICD-10-CM | POA: Diagnosis not present

## 2023-07-21 DIAGNOSIS — E785 Hyperlipidemia, unspecified: Secondary | ICD-10-CM | POA: Diagnosis not present

## 2023-07-21 DIAGNOSIS — R1031 Right lower quadrant pain: Secondary | ICD-10-CM

## 2023-07-21 DIAGNOSIS — I10 Essential (primary) hypertension: Secondary | ICD-10-CM | POA: Diagnosis not present

## 2023-07-21 DIAGNOSIS — D696 Thrombocytopenia, unspecified: Secondary | ICD-10-CM | POA: Diagnosis not present

## 2023-07-21 DIAGNOSIS — C221 Intrahepatic bile duct carcinoma: Secondary | ICD-10-CM | POA: Diagnosis not present

## 2023-07-21 MED ORDER — TRAMADOL HCL 50 MG PO TABS: 50.0000 mg | ORAL_TABLET | Freq: Two times a day (BID) | ORAL | 0 refills | Status: DC | PRN

## 2023-07-21 MED ORDER — DEXAMETHASONE SODIUM PHOSPHATE 10 MG/ML IJ SOLN
10.0000 mg | Freq: Once | INTRAMUSCULAR | Status: AC
  Administered 2023-07-21: 10 mg via INTRAVENOUS
  Filled 2023-07-21: qty 1

## 2023-07-21 MED ORDER — SODIUM CHLORIDE 0.9 % IV SOLN
INTRAVENOUS | Status: DC
Start: 2023-07-21 — End: 2023-07-21

## 2023-07-21 MED ORDER — HEPARIN SOD (PORK) LOCK FLUSH 100 UNIT/ML IV SOLN
500.0000 [IU] | Freq: Once | INTRAVENOUS | Status: AC | PRN
  Administered 2023-07-21: 500 [IU]

## 2023-07-21 MED ORDER — SODIUM CHLORIDE 0.9 % IV SOLN
150.0000 mg | Freq: Once | INTRAVENOUS | Status: AC
  Administered 2023-07-21: 150 mg via INTRAVENOUS
  Filled 2023-07-21: qty 150

## 2023-07-21 MED ORDER — SODIUM CHLORIDE 0.9 % IV SOLN
1000.0000 mg/m2 | Freq: Once | INTRAVENOUS | Status: AC
  Administered 2023-07-21: 2128 mg via INTRAVENOUS
  Filled 2023-07-21: qty 51.97

## 2023-07-21 MED ORDER — SODIUM CHLORIDE 0.9% FLUSH
10.0000 mL | INTRAVENOUS | Status: DC | PRN
  Administered 2023-07-21: 10 mL

## 2023-07-21 MED ORDER — PALONOSETRON HCL INJECTION 0.25 MG/5ML
0.2500 mg | Freq: Once | INTRAVENOUS | Status: AC
  Administered 2023-07-21: 0.25 mg via INTRAVENOUS
  Filled 2023-07-21: qty 5

## 2023-07-21 MED ORDER — SUCRALFATE 1 G PO TABS
1.0000 g | ORAL_TABLET | Freq: Four times a day (QID) | ORAL | 0 refills | Status: DC
Start: 2023-07-21 — End: 2023-08-19

## 2023-07-21 MED ORDER — SODIUM CHLORIDE 0.9 % IV SOLN
25.0000 mg/m2 | Freq: Once | INTRAVENOUS | Status: AC
  Administered 2023-07-21: 50 mg via INTRAVENOUS
  Filled 2023-07-21: qty 50

## 2023-07-21 MED ORDER — MAGNESIUM SULFATE 2 GM/50ML IV SOLN
2.0000 g | Freq: Once | INTRAVENOUS | Status: AC
  Administered 2023-07-21: 2 g via INTRAVENOUS
  Filled 2023-07-21: qty 50

## 2023-07-21 MED ORDER — POTASSIUM CHLORIDE IN NACL 20-0.9 MEQ/L-% IV SOLN
Freq: Once | INTRAVENOUS | Status: AC
  Filled 2023-07-21: qty 1000

## 2023-07-21 MED ORDER — ACETAMINOPHEN 325 MG PO TABS
650.0000 mg | ORAL_TABLET | Freq: Once | ORAL | Status: AC
  Administered 2023-07-21: 650 mg via ORAL
  Filled 2023-07-21: qty 2

## 2023-07-21 NOTE — Progress Notes (Signed)
 [name] is here today for chemotherapy.  CT findings were reviewed with Dr. Wilhelmenia.  Plan to proceed with treatment today as scheduled.  Prescription sent to his pharmacy for tramadol .  He understands he should not drive while taking pain medication.  Carafate  increased to 4 times daily, order placed for upper GI series.

## 2023-07-21 NOTE — Progress Notes (Signed)
 Ok to run post hydration fluids with cisplatin  per Olam NP.

## 2023-07-22 ENCOUNTER — Ambulatory Visit (HOSPITAL_COMMUNITY)
Admission: RE | Admit: 2023-07-22 | Discharge: 2023-07-22 | Disposition: A | Discharge status: Home / Self Care | Source: Ambulatory Visit | Attending: Nurse Practitioner | Admitting: Nurse Practitioner

## 2023-07-22 ENCOUNTER — Inpatient Hospital Stay

## 2023-07-22 ENCOUNTER — Telehealth: Payer: Self-pay | Admitting: Oncology

## 2023-07-22 DIAGNOSIS — C240 Malignant neoplasm of extrahepatic bile duct: Secondary | ICD-10-CM | POA: Insufficient documentation

## 2023-07-22 DIAGNOSIS — R1031 Right lower quadrant pain: Secondary | ICD-10-CM | POA: Diagnosis not present

## 2023-07-22 DIAGNOSIS — Z4682 Encounter for fitting and adjustment of non-vascular catheter: Secondary | ICD-10-CM | POA: Diagnosis not present

## 2023-07-22 DIAGNOSIS — K2970 Gastritis, unspecified, without bleeding: Secondary | ICD-10-CM | POA: Diagnosis not present

## 2023-07-22 DIAGNOSIS — K2980 Duodenitis without bleeding: Secondary | ICD-10-CM | POA: Diagnosis not present

## 2023-07-22 MED ORDER — PEGFILGRASTIM-JMDB 6 MG/0.6ML ~~LOC~~ SOSY: 6.0000 mg | PREFILLED_SYRINGE | Freq: Once | SUBCUTANEOUS | Status: AC

## 2023-07-22 NOTE — Telephone Encounter (Signed)
 Called PT about appt, left vm

## 2023-07-25 ENCOUNTER — Inpatient Hospital Stay

## 2023-07-25 VITALS — BP 127/75 | HR 70 | Temp 97.9°F | Resp 18

## 2023-07-25 DIAGNOSIS — C221 Intrahepatic bile duct carcinoma: Secondary | ICD-10-CM | POA: Diagnosis not present

## 2023-07-25 DIAGNOSIS — C61 Malignant neoplasm of prostate: Secondary | ICD-10-CM | POA: Diagnosis not present

## 2023-07-25 DIAGNOSIS — I10 Essential (primary) hypertension: Secondary | ICD-10-CM | POA: Diagnosis not present

## 2023-07-25 DIAGNOSIS — C240 Malignant neoplasm of extrahepatic bile duct: Secondary | ICD-10-CM | POA: Diagnosis not present

## 2023-07-25 DIAGNOSIS — Z79899 Other long term (current) drug therapy: Secondary | ICD-10-CM | POA: Diagnosis not present

## 2023-07-25 DIAGNOSIS — Z5111 Encounter for antineoplastic chemotherapy: Secondary | ICD-10-CM | POA: Diagnosis not present

## 2023-07-25 DIAGNOSIS — E785 Hyperlipidemia, unspecified: Secondary | ICD-10-CM | POA: Diagnosis not present

## 2023-07-25 DIAGNOSIS — D696 Thrombocytopenia, unspecified: Secondary | ICD-10-CM | POA: Diagnosis not present

## 2023-07-25 DIAGNOSIS — J449 Chronic obstructive pulmonary disease, unspecified: Secondary | ICD-10-CM | POA: Diagnosis not present

## 2023-07-25 DIAGNOSIS — R1031 Right lower quadrant pain: Secondary | ICD-10-CM | POA: Diagnosis not present

## 2023-07-25 MED ORDER — PEGFILGRASTIM-JMDB 6 MG/0.6ML ~~LOC~~ SOSY
6.0000 mg | PREFILLED_SYRINGE | Freq: Once | SUBCUTANEOUS | Status: AC
  Administered 2023-07-25: 6 mg via SUBCUTANEOUS

## 2023-07-25 NOTE — Patient Instructions (Signed)
 [facility] A DEPT OF [facility]  Discharge Instructions: Thank you for choosing [name] to provide your oncology and hematology care.   If you have a lab appointment with the Cancer [name], please go directly to the Cancer [name] and check in at the registration area.   Wear comfortable clothing and clothing appropriate for easy access to any Portacath or PICC line.   We strive to give you quality time with your provider. You may need to reschedule your appointment if you arrive late (15 or more minutes).  Arriving late affects you and other patients whose appointments are after yours.  Also, if you miss three or more appointments without notifying the office, you may be dismissed from the clinic at the provider's discretion.      For prescription refill requests, have your pharmacy contact our office and allow 72 hours for refills to be completed.    Today you received the following Fulphila.  Pegfilgrastim Injection What is this medication? PEGFILGRASTIM (PEG fil gra stim) lowers the risk of infection in people who are receiving chemotherapy. It works by Systems analyst make more white blood cells, which protects your body from infection. It may also be used to help people who have been exposed to high doses of radiation. This medicine may be used for other purposes; ask your health care provider or pharmacist if you have questions. COMMON BRAND NAME(S): Cherly Hensen, Neulasta, Nyvepria, Stimufend, UDENYCA, UDENYCA ONBODY, Ziextenzo What should I tell my care team before I take this medication? They need to know if you have any of these conditions: Kidney disease Latex allergy Ongoing radiation therapy Sickle cell disease Skin reactions to acrylic adhesives (On-Body Injector only) An unusual or allergic reaction to pegfilgrastim, filgrastim, other medications, foods, dyes, or preservatives Pregnant or trying to get  pregnant Breast-feeding How should I use this medication? This medication is for injection under the skin. If you get this medication at home, you will be taught how to prepare and give the pre-filled syringe or how to use the On-body Injector. Refer to the patient Instructions for Use for detailed instructions. Use exactly as directed. Tell your care team immediately if you suspect that the On-body Injector may not have performed as intended or if you suspect the use of the On-body Injector resulted in a missed or partial dose. It is important that you put your used needles and syringes in a special sharps container. Do not put them in a trash can. If you do not have a sharps container, call your pharmacist or care team to get one. Talk to your care team about the use of this medication in children. While this medication may be prescribed for selected conditions, precautions do apply. Overdosage: If you think you have taken too much of this medicine contact a poison control [name] or emergency room at once. NOTE: This medicine is only for you. Do not share this medicine with others. What if I miss a dose? It is important not to miss your dose. Call your care team if you miss your dose. If you miss a dose due to an On-body Injector failure or leakage, a new dose should be administered as soon as possible using a single prefilled syringe for manual use. What may interact with this medication? Interactions have not been studied. This list may not describe all possible interactions. Give your health care provider a list of all the medicines, herbs, non-prescription drugs,  or dietary supplements you use. Also tell them if you smoke, drink alcohol, or use illegal drugs. Some items may interact with your medicine. What should I watch for while using this medication? Your condition will be monitored carefully while you are receiving this medication. You may need blood work done while you are taking this  medication. Talk to your care team about your risk of cancer. You may be more at risk for certain types of cancer if you take this medication. If you are going to need a MRI, CT scan, or other procedure, tell your care team that you are using this medication (On-Body Injector only). What side effects may I notice from receiving this medication? Side effects that you should report to your care team as soon as possible: Allergic reactions--skin rash, itching, hives, swelling of the face, lips, tongue, or throat Capillary leak syndrome--stomach or muscle pain, unusual weakness or fatigue, feeling faint or lightheaded, decrease in the amount of urine, swelling of the ankles, hands, or feet, trouble breathing High white blood cell level--fever, fatigue, trouble breathing, night sweats, change in vision, weight loss Inflammation of the aorta--fever, fatigue, back, chest, or stomach pain, severe headache Kidney injury (glomerulonephritis)--decrease in the amount of urine, red or dark brown urine, foamy or bubbly urine, swelling of the ankles, hands, or feet Shortness of breath or trouble breathing Spleen injury--pain in upper left stomach or shoulder Unusual bruising or bleeding Side effects that usually do not require medical attention (report to your care team if they continue or are bothersome): Bone pain Pain in the hands or feet This list may not describe all possible side effects. Call your doctor for medical advice about side effects. You may report side effects to FDA at 1-800-FDA-1088. Where should I keep my medication? Keep out of the reach of children. If you are using this medication at home, you will be instructed on how to store it. Throw away any unused medication after the expiration date on the label. NOTE: This sheet is a summary. It may not cover all possible information. If you have questions about this medicine, talk to your doctor, pharmacist, or health care provider.  2024  Elsevier/Gold Standard (2020-11-28 00:00:00)      To help prevent nausea and vomiting after your treatment, we encourage you to take your nausea medication as directed.  BELOW ARE SYMPTOMS THAT SHOULD BE REPORTED IMMEDIATELY: *FEVER GREATER THAN 100.4 F (38 C) OR HIGHER *CHILLS OR SWEATING *NAUSEA AND VOMITING THAT IS NOT CONTROLLED WITH YOUR NAUSEA MEDICATION *UNUSUAL SHORTNESS OF BREATH *UNUSUAL BRUISING OR BLEEDING *URINARY PROBLEMS (pain or burning when urinating, or frequent urination) *BOWEL PROBLEMS (unusual diarrhea, constipation, pain near the anus) TENDERNESS IN MOUTH AND THROAT WITH OR WITHOUT PRESENCE OF ULCERS (sore throat, sores in mouth, or a toothache) UNUSUAL RASH, SWELLING OR PAIN  UNUSUAL VAGINAL DISCHARGE OR ITCHING   Items with * indicate a potential emergency and should be followed up as soon as possible or go to the Emergency Department if any problems should occur.  Please show the CHEMOTHERAPY ALERT CARD or IMMUNOTHERAPY ALERT CARD at check-in to the Emergency Department and triage nurse.  Should you have questions after your visit or need to cancel or reschedule your appointment, please contact [name]  Dept: [phone]  and follow the prompts.  Office hours are 8:00 a.m. to 4:30 p.m. Monday - Friday. Please note that voicemails left after 4:00 p.m. may  not be returned until the following business day.  We are closed weekends and major holidays. You have access to a nurse at all times for urgent questions. Please call the main number to the clinic Dept: [phone] and follow the prompts.   For any non-urgent questions, you may also contact your provider using MyChart. We now offer e-Visits for anyone 27 and older to request care online for non-urgent symptoms. For details visit [email or web address].   Also download the MyChart app! Go to the app store, search "MyChart", open the app, select [facility], and log in with your MyChart username and password.

## 2023-07-26 DIAGNOSIS — Z716 Tobacco abuse counseling: Secondary | ICD-10-CM | POA: Diagnosis not present

## 2023-07-26 DIAGNOSIS — C221 Intrahepatic bile duct carcinoma: Secondary | ICD-10-CM | POA: Diagnosis not present

## 2023-07-26 DIAGNOSIS — J449 Chronic obstructive pulmonary disease, unspecified: Secondary | ICD-10-CM | POA: Diagnosis not present

## 2023-07-26 DIAGNOSIS — F17210 Nicotine dependence, cigarettes, uncomplicated: Secondary | ICD-10-CM | POA: Diagnosis not present

## 2023-08-04 ENCOUNTER — Other Ambulatory Visit

## 2023-08-04 ENCOUNTER — Ambulatory Visit: Admitting: Nurse Practitioner

## 2023-08-05 ENCOUNTER — Ambulatory Visit

## 2023-08-05 ENCOUNTER — Other Ambulatory Visit: Payer: Self-pay | Admitting: Oncology

## 2023-08-08 ENCOUNTER — Inpatient Hospital Stay

## 2023-08-08 ENCOUNTER — Inpatient Hospital Stay: Admitting: Nutrition

## 2023-08-08 ENCOUNTER — Encounter: Payer: Self-pay | Admitting: Oncology

## 2023-08-08 ENCOUNTER — Encounter: Payer: Self-pay | Admitting: Genetic Counselor

## 2023-08-08 ENCOUNTER — Inpatient Hospital Stay (HOSPITAL_BASED_OUTPATIENT_CLINIC_OR_DEPARTMENT_OTHER): Admitting: Oncology

## 2023-08-08 VITALS — BP 111/58 | HR 77 | Temp 97.7°F | Resp 18 | Ht 66.0 in | Wt 211.9 lb

## 2023-08-08 DIAGNOSIS — C61 Malignant neoplasm of prostate: Secondary | ICD-10-CM | POA: Diagnosis not present

## 2023-08-08 DIAGNOSIS — I10 Essential (primary) hypertension: Secondary | ICD-10-CM | POA: Diagnosis not present

## 2023-08-08 DIAGNOSIS — Z5111 Encounter for antineoplastic chemotherapy: Secondary | ICD-10-CM | POA: Diagnosis not present

## 2023-08-08 DIAGNOSIS — E785 Hyperlipidemia, unspecified: Secondary | ICD-10-CM | POA: Diagnosis not present

## 2023-08-08 DIAGNOSIS — D696 Thrombocytopenia, unspecified: Secondary | ICD-10-CM | POA: Diagnosis not present

## 2023-08-08 DIAGNOSIS — R1031 Right lower quadrant pain: Secondary | ICD-10-CM | POA: Diagnosis not present

## 2023-08-08 DIAGNOSIS — Z79899 Other long term (current) drug therapy: Secondary | ICD-10-CM | POA: Diagnosis not present

## 2023-08-08 DIAGNOSIS — C221 Intrahepatic bile duct carcinoma: Secondary | ICD-10-CM | POA: Diagnosis not present

## 2023-08-08 DIAGNOSIS — Z95828 Presence of other vascular implants and grafts: Secondary | ICD-10-CM

## 2023-08-08 DIAGNOSIS — C240 Malignant neoplasm of extrahepatic bile duct: Secondary | ICD-10-CM

## 2023-08-08 DIAGNOSIS — J449 Chronic obstructive pulmonary disease, unspecified: Secondary | ICD-10-CM | POA: Diagnosis not present

## 2023-08-08 LAB — CMP (CANCER CENTER ONLY)
ALT: 18 U/L (ref 0–44)
AST: 23 U/L (ref 15–41)
Albumin: 4 g/dL (ref 3.5–5.0)
Alkaline Phosphatase: 122 U/L (ref 38–126)
Anion gap: 12 (ref 5–15)
BUN: 15 mg/dL (ref 8–23)
CO2: 27 mmol/L (ref 22–32)
Calcium: 9.8 mg/dL (ref 8.9–10.3)
Chloride: 98 mmol/L (ref 98–111)
Creatinine: 0.74 mg/dL (ref 0.61–1.24)
GFR, Estimated: >60 mL/min (ref >60)
Glucose, Bld: 133 mg/dL — ABNORMAL HIGH (ref 70–99)
Potassium: 3.9 mmol/L (ref 3.5–5.1)
Sodium: 137 mmol/L (ref 135–145)
Total Bilirubin: 0.3 mg/dL (ref 0.0–1.2)
Total Protein: 6.9 g/dL (ref 6.5–8.1)

## 2023-08-08 LAB — CBC WITH DIFFERENTIAL (CANCER CENTER ONLY)
Abs Immature Granulocytes: 0.32 K/uL — ABNORMAL HIGH (ref 0.00–0.07)
Basophils Absolute: 0.2 K/uL — ABNORMAL HIGH (ref 0.0–0.1)
Basophils Relative: 1 %
Eosinophils Absolute: 0.3 K/uL (ref 0.0–0.5)
Eosinophils Relative: 2 %
HCT: 40.3 % (ref 39.0–52.0)
Hemoglobin: 13.5 g/dL (ref 13.0–17.0)
Immature Granulocytes: 2 %
Lymphocytes Relative: 18 %
Lymphs Abs: 2.8 K/uL (ref 0.7–4.0)
MCH: 33.3 pg (ref 26.0–34.0)
MCHC: 33.5 g/dL (ref 30.0–36.0)
MCV: 99.5 fL (ref 80.0–100.0)
Monocytes Absolute: 1.4 K/uL — ABNORMAL HIGH (ref 0.1–1.0)
Monocytes Relative: 9 %
Neutro Abs: 10.6 K/uL — ABNORMAL HIGH (ref 1.7–7.7)
Neutrophils Relative %: 68 %
Platelet Count: 249 K/uL (ref 150–400)
RBC: 4.05 MIL/uL — ABNORMAL LOW (ref 4.22–5.81)
RDW: 14.8 % (ref 11.5–15.5)
WBC Count: 15.5 K/uL — ABNORMAL HIGH (ref 4.0–10.5)
nRBC: 0 % (ref 0.0–0.2)

## 2023-08-08 LAB — MAGNESIUM: Magnesium: 1.9 mg/dL (ref 1.7–2.4)

## 2023-08-08 LAB — TSH: TSH: 1.08 u[IU]/mL (ref 0.350–4.500)

## 2023-08-08 MED ORDER — SODIUM CHLORIDE 0.9% FLUSH
10.0000 mL | INTRAVENOUS | Status: AC | PRN
  Administered 2023-08-08: 10 mL

## 2023-08-08 MED ORDER — HEPARIN SOD (PORK) LOCK FLUSH 100 UNIT/ML IV SOLN
500.0000 [IU] | INTRAVENOUS | Status: AC | PRN
  Administered 2023-08-08: 500 [IU]

## 2023-08-08 NOTE — Progress Notes (Signed)
 Nutrition follow up completed with patient diagnosed with Extrahepatic Cholangiocarcinoma and followed by Dr. Cloretta. He receives cisplatin , gemcitabine  and Durvalumab .   Labs include Glucose 133.  Weight: 211 pounds 14.4 oz. July 28 215 pounds 12.8 oz June 16 216 pounds 6.4 oz May 21.  Patient is tolerating regular diet. Weight fluctuates between 211 and 215 pounds. He denies difficulty eating. No nausea, vomiting, constipation or diarrhea. He is pleased with weight maintenance. No questions or concerns.  Nutrition diagnosis: Food and Nutrition Related Knowledge deficit improved.  Intervention: Continue small, frequent meals and snacks. Continue to consume adequate liquids. Try ONS and add one daily if oral intake decreases. Provided samples. Encouraged weight maintenance.  Monitoring, Evaluation, Goals: Tolerate adequate calories and protein to minimize weight loss.  Next Visit: To be scheduled as needed. Patient has RD contact information.

## 2023-08-08 NOTE — Patient Instructions (Signed)

## 2023-08-08 NOTE — Progress Notes (Signed)
 [facility] OFFICE PROGRESS NOTE   Diagnosis: Cholangiocarcinoma  INTERVAL HISTORY:   [name] has completed 2 cycles of gemcitabine /cisplatin /durvalumab .  He completed day 8 cycle 2 chemotherapy on 07/21/2023.  No nausea/vomiting, mouth sores, diarrhea, rash, or neuropathy symptoms.  He feels well.  The right abdominal pain has resolved.  The pain resolved greater than a week ago.  Objective:  Vital signs in last 24 hours:  Blood pressure (!) 111/58, pulse 77, temperature 97.7 F (36.5 C), resp. rate 18, height 5' 6 (1.676 m), weight 211 lb 14.4 oz (96.1 kg), SpO2 96%.    HEENT: No thrush or ulcers Resp: Lungs clear bilaterally Cardio: Regular rate and rhythm GI: No hepatosplenomegaly, no mass, nontender Vascular: No leg edema   Portacath/PICC-without erythema  Lab Results:  Lab Results  Component Value Date   WBC 15.5 (H) 08/08/2023   HGB 13.5 08/08/2023   HCT 40.3 08/08/2023   MCV 99.5 08/08/2023   PLT 249 08/08/2023   NEUTROABS 10.6 (H) 08/08/2023    CMP  Lab Results  Component Value Date   NA 137 08/08/2023   K 3.9 08/08/2023   CL 98 08/08/2023   CO2 27 08/08/2023   GLUCOSE 133 (H) 08/08/2023   BUN 15 08/08/2023   CREATININE 0.74 08/08/2023   CALCIUM  9.8 08/08/2023   PROT 6.9 08/08/2023   ALBUMIN 4.0 08/08/2023   AST 23 08/08/2023   ALT 18 08/08/2023   ALKPHOS 122 08/08/2023   BILITOT 0.3 08/08/2023   GFRNONAA >60 08/08/2023   GFRAA 117 03/21/2020    Lab Results  Component Value Date   CEA1 4.8 (H) 05/03/2023   CAN199 351 (H) 06/08/2023    Medications: I have reviewed the patient's current medications.   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  Duodenitis.  Plastic biliary and pancreatic stents noted at the major papilla.  Pancreatic stent removed. 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     Disposition: [name] has completed 2 cycles of systemic therapy for treatment of cholangiocarcinoma.  He has tolerated the treatment well.  The abdominal pain he experienced earlier this month has resolved.  He will complete cycle 3 gemcitabine /cisplatin /durvalumab  beginning on 08/09/2023.  Return for an office visit and day 8 chemotherapy next week.  9 We will consult with Dr. Romero next week regarding the scheduling of a restaging evaluation.  Arley Hof,  MD  08/08/2023  2:10 PM

## 2023-08-09 ENCOUNTER — Ambulatory Visit: Admitting: Oncology

## 2023-08-09 ENCOUNTER — Encounter: Payer: Self-pay | Admitting: Genetic Counselor

## 2023-08-09 ENCOUNTER — Inpatient Hospital Stay

## 2023-08-09 ENCOUNTER — Other Ambulatory Visit

## 2023-08-09 ENCOUNTER — Inpatient Hospital Stay (HOSPITAL_BASED_OUTPATIENT_CLINIC_OR_DEPARTMENT_OTHER): Admitting: Genetic Counselor

## 2023-08-09 ENCOUNTER — Other Ambulatory Visit: Payer: Self-pay

## 2023-08-09 VITALS — BP 121/64 | HR 60 | Temp 98.7°F | Resp 18

## 2023-08-09 DIAGNOSIS — C240 Malignant neoplasm of extrahepatic bile duct: Secondary | ICD-10-CM

## 2023-08-09 DIAGNOSIS — Z5111 Encounter for antineoplastic chemotherapy: Secondary | ICD-10-CM | POA: Diagnosis not present

## 2023-08-09 DIAGNOSIS — I10 Essential (primary) hypertension: Secondary | ICD-10-CM | POA: Diagnosis not present

## 2023-08-09 DIAGNOSIS — C61 Malignant neoplasm of prostate: Secondary | ICD-10-CM

## 2023-08-09 DIAGNOSIS — Z1501 Genetic susceptibility to malignant neoplasm of breast: Secondary | ICD-10-CM

## 2023-08-09 DIAGNOSIS — D696 Thrombocytopenia, unspecified: Secondary | ICD-10-CM | POA: Diagnosis not present

## 2023-08-09 DIAGNOSIS — Z1379 Encounter for other screening for genetic and chromosomal anomalies: Secondary | ICD-10-CM

## 2023-08-09 DIAGNOSIS — Z1509 Genetic susceptibility to other malignant neoplasm: Secondary | ICD-10-CM

## 2023-08-09 DIAGNOSIS — E785 Hyperlipidemia, unspecified: Secondary | ICD-10-CM | POA: Diagnosis not present

## 2023-08-09 DIAGNOSIS — J449 Chronic obstructive pulmonary disease, unspecified: Secondary | ICD-10-CM | POA: Diagnosis not present

## 2023-08-09 DIAGNOSIS — R1031 Right lower quadrant pain: Secondary | ICD-10-CM | POA: Diagnosis not present

## 2023-08-09 DIAGNOSIS — Z79899 Other long term (current) drug therapy: Secondary | ICD-10-CM | POA: Diagnosis not present

## 2023-08-09 DIAGNOSIS — C221 Intrahepatic bile duct carcinoma: Secondary | ICD-10-CM | POA: Diagnosis not present

## 2023-08-09 LAB — T4: T4, Total: 9.5 ug/dL (ref 4.5–12.0)

## 2023-08-09 MED ORDER — MAGNESIUM SULFATE 2 GM/50ML IV SOLN
2.0000 g | Freq: Once | INTRAVENOUS | Status: AC
  Administered 2023-08-09: 2 g via INTRAVENOUS
  Filled 2023-08-09: qty 50

## 2023-08-09 MED ORDER — HEPARIN SOD (PORK) LOCK FLUSH 100 UNIT/ML IV SOLN
500.0000 [IU] | Freq: Once | INTRAVENOUS | Status: AC | PRN
  Administered 2023-08-09: 500 [IU]

## 2023-08-09 MED ORDER — DEXAMETHASONE SODIUM PHOSPHATE 10 MG/ML IJ SOLN
10.0000 mg | Freq: Once | INTRAMUSCULAR | Status: AC
  Administered 2023-08-09: 10 mg via INTRAVENOUS
  Filled 2023-08-09: qty 1

## 2023-08-09 MED ORDER — SODIUM CHLORIDE 0.9 % IV SOLN
25.0000 mg/m2 | Freq: Once | INTRAVENOUS | Status: AC
  Administered 2023-08-09: 50 mg via INTRAVENOUS
  Filled 2023-08-09: qty 50

## 2023-08-09 MED ORDER — SODIUM CHLORIDE 0.9 % IV SOLN: INTRAVENOUS | Status: DC

## 2023-08-09 MED ORDER — SODIUM CHLORIDE 0.9% FLUSH
10.0000 mL | INTRAVENOUS | Status: DC | PRN
Start: 2023-08-09 — End: 2023-08-09
  Administered 2023-08-09: 10 mL

## 2023-08-09 MED ORDER — SODIUM CHLORIDE 0.9 % IV SOLN
INTRAVENOUS | Status: DC
Start: 2023-08-09 — End: 2023-08-09

## 2023-08-09 MED ORDER — PALONOSETRON HCL INJECTION 0.25 MG/5ML
0.2500 mg | Freq: Once | INTRAVENOUS | Status: AC
  Administered 2023-08-09: 0.25 mg via INTRAVENOUS
  Filled 2023-08-09: qty 5

## 2023-08-09 MED ORDER — SODIUM CHLORIDE 0.9 % IV SOLN
1000.0000 mg/m2 | Freq: Once | INTRAVENOUS | Status: AC
  Administered 2023-08-09: 2128 mg via INTRAVENOUS
  Filled 2023-08-09: qty 51.97

## 2023-08-09 MED ORDER — POTASSIUM CHLORIDE IN NACL 20-0.9 MEQ/L-% IV SOLN
Freq: Once | INTRAVENOUS | Status: AC
  Filled 2023-08-09: qty 1000

## 2023-08-09 MED ORDER — SODIUM CHLORIDE 0.9 % IV SOLN
1500.0000 mg | Freq: Once | INTRAVENOUS | Status: AC
  Administered 2023-08-09: 1500 mg via INTRAVENOUS
  Filled 2023-08-09: qty 30

## 2023-08-09 MED ORDER — SODIUM CHLORIDE 0.9 % IV SOLN
150.0000 mg | Freq: Once | INTRAVENOUS | Status: AC
  Administered 2023-08-09: 150 mg via INTRAVENOUS
  Filled 2023-08-09: qty 150

## 2023-08-09 NOTE — Progress Notes (Signed)
 Per Dr. Cloretta: OK to run Cisplatin  concurrently with last 2 hours of hydration fluids

## 2023-08-09 NOTE — Patient Instructions (Signed)
 [facility] A [facility] OF [place]  Discharge Instructions: Thank you for choosing [facility] to provide your oncology and hematology care.   If you have a lab appointment with the Cancer Center, please go directly to the Cancer Center and check in at the registration area.   Wear comfortable clothing and clothing appropriate for easy access to any Portacath or PICC line.   We strive to give you quality time with your provider. You may need to reschedule your appointment if you arrive late (15 or more minutes).  Arriving late affects you and other patients whose appointments are after yours.  Also, if you miss three or more appointments without notifying the office, you may be dismissed from the clinic at the provider's discretion.      For prescription refill requests, have your pharmacy contact our office and allow 72 hours for refills to be completed.    Today you received the following chemotherapy and/or immunotherapy agents: Durvalumab  (Imfinzi ), Gemcitabine  (Gemzar ), and Cisplatin  (Platinol )      To help prevent nausea and vomiting after your treatment, we encourage you to take your nausea medication as directed.  BELOW ARE SYMPTOMS THAT SHOULD BE REPORTED IMMEDIATELY: *FEVER GREATER THAN 100.4 F (38 C) OR HIGHER *CHILLS OR SWEATING *NAUSEA AND VOMITING THAT IS NOT CONTROLLED WITH YOUR NAUSEA MEDICATION *UNUSUAL SHORTNESS OF BREATH *UNUSUAL BRUISING OR BLEEDING *URINARY PROBLEMS (pain or burning when urinating, or frequent urination) *BOWEL PROBLEMS (unusual diarrhea, constipation, pain near the anus) TENDERNESS IN MOUTH AND THROAT WITH OR WITHOUT PRESENCE OF ULCERS (sore throat, sores in mouth, or a toothache) UNUSUAL RASH, SWELLING OR PAIN  UNUSUAL VAGINAL DISCHARGE OR ITCHING   Items with * indicate a potential emergency and should be followed up as soon as possible or go to the Emergency Department if any problems should  occur.  Please show the CHEMOTHERAPY ALERT CARD or IMMUNOTHERAPY ALERT CARD at check-in to the Emergency Department and triage nurse.  Should you have questions after your visit or need to cancel or reschedule your appointment, please contact [facility]  Dept: [phone]  and follow the prompts.  Office hours are 8:00 a.m. to 4:30 p.m. Monday - Friday. Please note that voicemails left after 4:00 p.m. may not be returned until the following business day.  We are closed weekends and major holidays. You have access to a nurse at all times for urgent questions. Please call the main number to the clinic Dept: [phone] and follow the prompts.   For any non-urgent questions, you may also contact your provider using MyChart. We now offer e-Visits for anyone 23 and older to request care online for non-urgent symptoms. For details visit [email or web address].   Also download the MyChart app! Go to the app store, search MyChart, open the app, select [facility], and log in with your MyChart username and password.

## 2023-08-09 NOTE — Progress Notes (Signed)
 REFERRING PROVIDER: Debby Olam POUR, NP [address] [place],  [place] [address]  PRIMARY PROVIDER:  Omar Cumins, FNP  PRIMARY REASON FOR VISIT:  1. Malignant neoplasm of prostate (HCC)   2. 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 BRCA2 gene mutation positive   4. Genetic testing      HISTORY OF PRESENT ILLNESS:   [name], a 68 y.o. male, was seen for a [facility] cancer genetics consultation at the request of Olam. Debby due to a personal history of prostate and bile duct cancer.  [name] presents to clinic today to discuss the possibility of a hereditary predisposition to cancer, genetic testing, and to further clarify his future cancer risks, as well as potential cancer risks for family members.   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   COPD (chronic obstructive pulmonary disease) (HCC)    Elevated hemoglobin A1c    Elevated PSA 03/16/2016   Hypertension    Personal history of colonic adenomas 12/28/2007   Vitamin D  deficiency     Past Surgical History:  Procedure Laterality Date   BILIARY BRUSHING  05/06/2023   Procedure: BRUSH BIOPSY, BILE DUCT;  Surgeon: Wilhelmenia Aloha Raddle., MD;  Location: [facility] ENDOSCOPY;  Service: Gastroenterology;;   BILIARY STENT PLACEMENT  05/06/2023   Procedure: INSERTION, STENT, BILE DUCT;  Surgeon: Wilhelmenia Aloha Raddle., MD;  Location: [facility] ENDOSCOPY;  Service: Gastroenterology;;    BIOPSY OF SKIN SUBCUTANEOUS TISSUE AND/OR MUCOUS MEMBRANE  05/06/2023   Procedure: BIOPSY, SKIN, SUBCUTANEOUS TISSUE, OR MUCOUS MEMBRANE;  Surgeon: Wilhelmenia Aloha Raddle., MD;  Location: [facility] ENDOSCOPY;  Service: Gastroenterology;;   COLONOSCOPY  2009   CG  hems, TAs   ERCP N/A 05/06/2023   Procedure: ERCP, WITH INTERVENTION IF INDICATED;  Surgeon: Wilhelmenia Aloha Raddle., MD;  Location: [facility] ENDOSCOPY;  Service: Gastroenterology;  Laterality: N/A;   ESOPHAGOGASTRODUODENOSCOPY N/A 06/16/2023   Procedure: EGD (ESOPHAGOGASTRODUODENOSCOPY);  Surgeon: Wilhelmenia Aloha Raddle., MD;  Location: [facility] ENDOSCOPY;  Service: Gastroenterology;  Laterality: N/A;   IR IMAGING GUIDED PORT INSERTION  06/10/2023   LYMPHADENECTOMY N/A 06/28/2016   Procedure: LYMPHADENECTOMY;  Surgeon: Renda Glance, MD;  Location: [facility] ORS;  Service: Urology;  Laterality: N/A;   PANCREATIC STENT PLACEMENT  05/06/2023   Procedure: INSERTION, STENT, PANCREATIC DUCT;  Surgeon: Wilhelmenia Aloha Raddle., MD;  Location: [facility] ENDOSCOPY;  Service: Gastroenterology;;   PROSTATE BIOPSY  03/16/2016   Surgical Pathology Gross and Microscopic Examination for Prostate Needle   ROBOT ASSISTED LAPAROSCOPIC RADICAL PROSTATECTOMY N/A 06/28/2016   Procedure: XI ROBOTIC ASSISTED LAPAROSCOPIC RADICAL PROSTATECTOMY LEVEL 2 EXCISION OF PENILE LESION;  Surgeon: Renda Glance, MD;  Location: [facility] ORS;  Service: Urology;  Laterality: N/A;    Social History   Socioeconomic History   Marital status: Widowed    Spouse name: Not on file   Number of children: Not on file   Years of education: Not on file  Highest education level: Not on file  Occupational History   Not on file  Tobacco Use   Smoking status: Every Day    Current packs/day: 0.50    Average packs/day: 0.5 packs/day for 40.0 years (20.0 ttl pk-yrs)    Types: Cigarettes   Smokeless tobacco: Never   Tobacco comments:    has decreased smoking significantly with goal of quitting  Vaping Use   Vaping  status: Never Used  Substance and Sexual Activity   Alcohol use: Yes    Alcohol/week: 4.0 standard drinks of alcohol    Types: 4 Cans of beer per week    Comment: occasionally   Drug use: No   Sexual activity: Yes  Other Topics Concern   Not on file  Social History Narrative   [facility] (06/29/16):   Currently works as a Chartered certified accountant. Exposure primarily to aluminum and steel. No history of bird or mold exposure. No recent hot tub exposure. Does have 2 dogs.   Social Drivers of Corporate investment banker Strain: Not on file  Food Insecurity: No Food Insecurity (06/01/2023)   Hunger Vital Sign    Worried About Running Out of Food in the Last Year: Never true    Ran Out of Food in the Last Year: Never true  Recent Concern: Food Insecurity - Food Insecurity Present (06/01/2023)   Hunger Vital Sign    Worried About Running Out of Food in the Last Year: Never true    Ran Out of Food in the Last Year: Sometimes true  Transportation Needs: No Transportation Needs (06/01/2023)   PRAPARE - Administrator, Civil Service (Medical): No    Lack of Transportation (Non-Medical): No  Physical Activity: Not on file  Stress: Not on file  Social Connections: Socially Isolated (05/03/2023)   Social Connection and Isolation Panel    Frequency of Communication with Friends and Family: More than three times a week    Frequency of Social Gatherings with Friends and Family: Twice a week    Attends Religious Services: Never    Database administrator or Organizations: No    Attends Banker Meetings: Never    Marital Status: Widowed     FAMILY HISTORY:  We obtained a detailed, 4-generation family history.  Significant diagnoses are listed below: Family History  Problem Relation Age of Onset   Lung cancer Mother    Cancer Maternal Aunt        NOS   Lung disease Neg Hx    Colon cancer Neg Hx    Rectal cancer Neg Hx    Stomach cancer Neg Hx      [name] reports a  family history of an unknown cancer in his maternal aunt, but otherwise is unsure of other cancer risks.  Mr. Peixoto is unaware of previous family history of genetic testing for hereditary cancer risks.   GENETIC COUNSELING ASSESSMENT: Mr. Fetterman is a 68 y.o. male with a personal history of prostate and bile duct cancer which is somewhat suggestive of a hereditary cancer syndrome and predisposition to cancer given the type of cancer he has. We, therefore, discussed and recommended the following at today's visit.   DISCUSSION: We discussed that, in general, most cancer is not inherited in families, but instead is sporadic or familial. Sporadic cancers occur by chance and typically happen at older ages (>50 years) as this type of cancer is caused by genetic changes acquired during an individual's lifetime. Some families  have more cancers than would be expected by chance; however, the ages or types of cancer are not consistent with a known genetic mutation or known genetic mutations have been ruled out. This type of familial cancer is thought to be due to a combination of multiple genetic, environmental, hormonal, and lifestyle factors. While this combination of factors likely increases the risk of cancer, the exact source of this risk is not currently identifiable or testable.  We discussed that 5 - 10% of cancer is hereditary, with most cases of prostate and bile duct cancer associated with BRCA mutations.  There are other genes that can be associated with hereditary prostate cancer syndromes.  These include ATM, and PALB2.  We discussed that testing is beneficial for several reasons including knowing how to follow individuals after completing their treatment, identifying whether potential treatment options such as PARP inhibitors would be beneficial, and understand if other family members could be at risk for cancer and allow them to undergo genetic testing.    GENETIC TESTING:  Mr. Swiss tested positive  for a single pathogenic variant (harmful genetic change) in the BRCA2 gene. Specifically, this variant is [id number].   The test report has been scanned into EPIC and is located under the Molecular Pathology section of the Results Review tab.? A portion of the result report is included below for reference. Genetic testing reported out on July 01, 2023.    Genetic testing did identify a variant of uncertain significance (VUS) was identified in the RAD51C gene called [id number].Q143R.  At this time, it is unknown if this variant is associated with increased cancer risk or if this is a normal finding, but most variants such as this get reclassified to being inconsequential. It should not be used to make medical management decisions. With time, we suspect the lab will determine the significance of this variant, if any. If we do learn more about it, we will try to contact Mr. Chopra to discuss it further. However, it is important to stay in touch with us  periodically and keep the address and phone number up to date.  Clinical Information:   Hereditary breast and ovarian cancer (HBOC) syndrome is characterized by an increased lifetime risk for, generally, adult-onset cancers including, breast, contralateral breast, male breast, ovarian, prostate, melanoma and pancreatic.   The cancers associated with BRCA2 are:   Male breast cancer, 55-69% risk  In women with a history of breast cancer, the risk for contralateral breast cancer 10 years after breast cancer diagnosis is 10-30%.   Male breast cancer, up to an 8% risk  Ovarian cancer, 13-29% risk  Pancreatic cancer, 5-10% risk  Prostate cancer, 19-61% risk  Melanoma, elevated risk   Management Recommendations:   Breast Screening/Risk Reduction:   Women:  Breast awareness starting at age 43  Clinical breast examination every 6-12 months starting at age 48   Breast cancer screening:  Age 50-29 years, annual breast MRI with and without contrast (or  mammogram, if MRI is unavailable), although the age to initiate screening may be individualized based on family history  Age 48-75 years, annual mammogram and breast MRI with and without contrast  Age >75 years, management should be considered on an individual basis  For women with a BRCA2 pathogenic or likely pathogenic variant who are treated for breast cancer and have not had a bilateral mastectomy, screening with annual mammogram and breast MRI should continue as described above.  The option of prophylactic bilateral risk-reducing mastectomy (RRM), removal of the  breast tissue before cancer develops, is the best option for significantly decreasing the risk of developing breast cancer. Studies have shown mastectomies reduce the risk of breast cancer by 90-95% in women with a BRCA2 mutation.    Males:  Breast self-exam training and education starting at age 15 years  Annual clinical breast exam starting at age 45 years   Consider annual mammogram starting at age 63 or 10 years before the earliest known male breast cancer in the family (whichever comes first).   Gynecological Cancer Screening/Risk Reduction:  It is recommended that women with a BRCA2 mutation have a risk-reducing salpingo oophorectomy (RRSO), removal of the ovaries and fallopian tubes. It is reasonable to delay RRSO until age 8-45 years unless age at diagnosis in the family warrants earlier age for consideration of RRSO.   Having a RRSO is estimated to reduce the risk of ovarian cancer by up to 96%. There is still a small risk of developing an ovarian-like cancer in the lining of the abdomen, called the peritoneum.  Another benefit to having the ovaries removed is the risk reduction for breast cancer. If the ovaries are removed before menopause, the risk of developing breast cancer is reduced.  Ovarian cancer screening is an option for women who chose not to have a RRSO or who have not yet completed their family. Current screening  methods for ovarian cancer are neither sensitive nor specific, meaning that often early stage ovarian cancer cannot be diagnosed through this screening.  Screening can also be falsely positive with no cancer present. For this reason, RRSO is recommended over screening. If ovarian cancer screening is recommended by a physician, it could include:  CA-125 blood tests  Transvaginal ultrasounds  Clinical pelvic exams   Skin Cancer Screening and Risk Reduction:  Regular skin self-examinations  Individuals should notify their physicians of any changes to moles such as increasing in size, darkening in color, or other change in appearance.  Annual skin examinations by a dermatologist   Follow sun-safety recommendations such as:  Using UVA and UVB 30 SPF or higher sunscreen  Avoiding sunburns  Limiting sun exposure, especially during the hours of 11am-4pm   Wearing protective clothing and sunglasses  Avoid using tanning beds  For more information about the prevention of melanoma visit [email or web address].[email or web address]   Prostate Cancer Screening:  Annual digital rectal exam (DRE) at age 80  Annual PSA blood test at age 58   Pancreatic Cancer Screening/Risk Reduction:  Avoid smoking, heavy alcohol use, and obesity.  It has been suggested that pancreatic cancer screening be limited to those with a family history of pancreatic cancer (first- or second-degree relative). Ideally, screening should be performed in experienced centers utilizing a multidisciplinary approach under research conditions. Recommended screening could include annual endoscopic ultrasound (preferred) and/or MRI of the pancreas starting at age 30 or 74 years younger than the earliest age diagnosis in the family.   Additional Considerations:  Individuals at risk for developing breast and ovarian cancer may benefit from the use of medication to reduce their risk for cancer. These medications are referred to as chemoprevention. For example, oral  contraceptive use has been shown to reduce the risk of ovarian cancer by approximately 60% in BRCA2 mutation carriers if taken for at least 5 years. This risk reduction remains even after discontinuation of oral contraceptives.  Recent studies have suggested PARP inhibitors may be a beneficial chemotherapeutic agent for a subset of patients with BRCA2-associated breast, ovarian, prostate, and pancreatic cancers. Clinical trials  are currently in process to determine if and how these agents can be useful in the treatment of BRCA2 cancer patients  Patients of reproductive age should be made aware of options for prenatal diagnosis and assisted reproduction including pre-implantation genetic diagnosis.  Individuals with a single pathogenic BRCA2 variant are carriers of Fanconi anemia. Fanconi anemia is characterized by developmental delay apparent from infancy, short stature, microcephaly, and coarse dysmorphic features. For there to be a risk of Fanconi anemia in offspring, both the patient and their partner would each have to carry a pathogenic variant in BRCA2. In this case, the risk of having an affected child is 25%.    This information is based on current understanding of the gene and may change in the future.   Implications for Family Members:  Hereditary predisposition to cancer due to pathogenic variants in the BRCA2 gene has autosomal dominant inheritance. This means that an individual with a pathogenic variant has a 50% chance of passing the condition on to his/her offspring. Identification of a pathogenic variant allows for the recognition of at-risk relatives who can pursue testing for the familial variant.   Family members are encouraged to consider genetic testing for this familial pathogenic variant. As there are generally no childhood cancer risks associated with a single pathogenic variant in the BRCA2 gene, individuals in the family are not recommended to have testing until they reach at  least 66 years of age. They may contact our office at [phone] for more information or to schedule an appointment. Complimentary testing for the familial variant is available for 90 days after the genetic testing report date.  Family members who live outside of the area are encouraged to find a genetic counselor in their area by visiting: [email or web address].   Resources:  FORCE (Facing Our Risk of Cancer Empowered) is a resource for those with a hereditary predisposition to develop cancer.  FORCE provides information about risk reduction, advocacy, legislation, and clinical trials.  Additionally, FORCE provides a platform for collaboration and support; which includes: peer navigation, message boards, local support groups, a toll-free help line, research registry and recruitment, advocate training, published medical research, webinars, brochures, mastectomy photos, and more.  For more information, visit www.facingourrisk.org   We encouraged Mr. Clare to remain in contact with us  on an annual basis so we can update his personal and family histories, and let him know of advances in cancer genetics that may benefit the family. Our contact number was provided. Mr. Livers questions were answered to his satisfaction today, and he knows he is welcome to call anytime with additional questions.   Luma Clopper P. Perri, MS, [facility] Licensed, Patent attorney [email or web address] phone: [phone]  50 minutes were spent on the date of the encounter in service to the patient including preparation, face-to-face consultation, documentation and care coordination.

## 2023-08-10 ENCOUNTER — Ambulatory Visit

## 2023-08-11 ENCOUNTER — Other Ambulatory Visit

## 2023-08-11 ENCOUNTER — Ambulatory Visit: Admitting: Nurse Practitioner

## 2023-08-11 ENCOUNTER — Inpatient Hospital Stay

## 2023-08-12 ENCOUNTER — Other Ambulatory Visit: Payer: Self-pay | Admitting: Oncology

## 2023-08-12 ENCOUNTER — Ambulatory Visit

## 2023-08-13 ENCOUNTER — Inpatient Hospital Stay: Attending: Oncology

## 2023-08-15 ENCOUNTER — Other Ambulatory Visit (HOSPITAL_BASED_OUTPATIENT_CLINIC_OR_DEPARTMENT_OTHER): Payer: Self-pay

## 2023-08-15 ENCOUNTER — Inpatient Hospital Stay: Attending: Oncology

## 2023-08-15 ENCOUNTER — Other Ambulatory Visit: Payer: Self-pay

## 2023-08-15 ENCOUNTER — Encounter: Payer: Self-pay | Admitting: Nurse Practitioner

## 2023-08-15 ENCOUNTER — Inpatient Hospital Stay (HOSPITAL_BASED_OUTPATIENT_CLINIC_OR_DEPARTMENT_OTHER): Admitting: Nurse Practitioner

## 2023-08-15 ENCOUNTER — Inpatient Hospital Stay

## 2023-08-15 VITALS — BP 102/68 | HR 93 | Temp 97.8°F | Resp 18 | Ht 66.0 in | Wt 213.5 lb

## 2023-08-15 DIAGNOSIS — E785 Hyperlipidemia, unspecified: Secondary | ICD-10-CM | POA: Diagnosis not present

## 2023-08-15 DIAGNOSIS — J449 Chronic obstructive pulmonary disease, unspecified: Secondary | ICD-10-CM | POA: Diagnosis not present

## 2023-08-15 DIAGNOSIS — I10 Essential (primary) hypertension: Secondary | ICD-10-CM | POA: Insufficient documentation

## 2023-08-15 DIAGNOSIS — I4892 Unspecified atrial flutter: Secondary | ICD-10-CM | POA: Insufficient documentation

## 2023-08-15 DIAGNOSIS — C61 Malignant neoplasm of prostate: Secondary | ICD-10-CM | POA: Diagnosis not present

## 2023-08-15 DIAGNOSIS — C240 Malignant neoplasm of extrahepatic bile duct: Secondary | ICD-10-CM

## 2023-08-15 DIAGNOSIS — Z7901 Long term (current) use of anticoagulants: Secondary | ICD-10-CM | POA: Diagnosis not present

## 2023-08-15 DIAGNOSIS — Z5111 Encounter for antineoplastic chemotherapy: Secondary | ICD-10-CM | POA: Insufficient documentation

## 2023-08-15 DIAGNOSIS — C221 Intrahepatic bile duct carcinoma: Secondary | ICD-10-CM | POA: Insufficient documentation

## 2023-08-15 DIAGNOSIS — Z79899 Other long term (current) drug therapy: Secondary | ICD-10-CM | POA: Insufficient documentation

## 2023-08-15 DIAGNOSIS — D696 Thrombocytopenia, unspecified: Secondary | ICD-10-CM | POA: Diagnosis not present

## 2023-08-15 LAB — CBC WITH DIFFERENTIAL (CANCER CENTER ONLY)
Abs Immature Granulocytes: 0.03 K/uL (ref 0.00–0.07)
Basophils Absolute: 0.1 K/uL (ref 0.0–0.1)
Basophils Relative: 1 %
Eosinophils Absolute: 0.1 K/uL (ref 0.0–0.5)
Eosinophils Relative: 1 %
HCT: 36.1 % — ABNORMAL LOW (ref 39.0–52.0)
Hemoglobin: 12.4 g/dL — ABNORMAL LOW (ref 13.0–17.0)
Immature Granulocytes: 0 %
Lymphocytes Relative: 30 %
Lymphs Abs: 2.5 K/uL (ref 0.7–4.0)
MCH: 33.8 pg (ref 26.0–34.0)
MCHC: 34.3 g/dL (ref 30.0–36.0)
MCV: 98.4 fL (ref 80.0–100.0)
Monocytes Absolute: 0.4 K/uL (ref 0.1–1.0)
Monocytes Relative: 5 %
Neutro Abs: 5.3 K/uL (ref 1.7–7.7)
Neutrophils Relative %: 63 %
Platelet Count: 102 K/uL — ABNORMAL LOW (ref 150–400)
RBC: 3.67 MIL/uL — ABNORMAL LOW (ref 4.22–5.81)
RDW: 14.6 % (ref 11.5–15.5)
WBC Count: 8.3 K/uL (ref 4.0–10.5)
nRBC: 0 % (ref 0.0–0.2)

## 2023-08-15 LAB — CMP (CANCER CENTER ONLY)
ALT: 36 U/L (ref 0–44)
AST: 22 U/L (ref 15–41)
Albumin: 3.9 g/dL (ref 3.5–5.0)
Alkaline Phosphatase: 82 U/L (ref 38–126)
Anion gap: 11 (ref 5–15)
BUN: 16 mg/dL (ref 8–23)
CO2: 28 mmol/L (ref 22–32)
Calcium: 10.1 mg/dL (ref 8.9–10.3)
Chloride: 95 mmol/L — ABNORMAL LOW (ref 98–111)
Creatinine: 0.67 mg/dL (ref 0.61–1.24)
GFR, Estimated: >60 mL/min (ref >60)
Glucose, Bld: 109 mg/dL — ABNORMAL HIGH (ref 70–99)
Potassium: 3.9 mmol/L (ref 3.5–5.1)
Sodium: 134 mmol/L — ABNORMAL LOW (ref 135–145)
Total Bilirubin: 0.6 mg/dL (ref 0.0–1.2)
Total Protein: 6.6 g/dL (ref 6.5–8.1)

## 2023-08-15 LAB — MAGNESIUM: Magnesium: 1.8 mg/dL (ref 1.7–2.4)

## 2023-08-15 MED ORDER — APIXABAN 5 MG PO TABS
5.0000 mg | ORAL_TABLET | Freq: Two times a day (BID) | ORAL | 2 refills | Status: DC
  Filled 2023-08-15: qty 60, 30d supply, fill #0
  Filled 2023-09-12: qty 60, 30d supply, fill #1
  Filled 2023-10-10: qty 60, 30d supply, fill #2

## 2023-08-15 NOTE — Progress Notes (Signed)
 Robert [name]   Diagnosis: Cholangiocarcinoma  INTERVAL HISTORY:   Robert [name] returns as scheduled.  He completed cycle 3-day 1 gemcitabine /cisplatin /Durvalumab  08/09/2023.  He denies nausea/vomiting.  No mouth sores.  No diarrhea.  No numbness or tingling in the hands or feet.  No tinnitus.  No hearing loss.  He denies shortness of breath.  No chest pain.  No leg swelling or calf pain.  No cough.  No fever.  No dizziness.  Objective:  Vital signs in last 24 hours:  Blood pressure 102/68, pulse 93, temperature 97.8 F (36.6 C), temperature source Temporal, resp. rate 18, height 5' 6 (1.676 m), weight 213 lb 8 oz (96.8 kg), SpO2 97%.  Repeat heart rate 93    HEENT: No thrush or ulcers. Resp: Lungs clear bilaterally. Cardio: Distant heart sounds, irregular. GI: No hepatosplenomegaly. Vascular: No leg edema. Neuro: Alert and oriented. Skin: Warm and dry.  No rash. Port-A-Cath without erythema.  Lab Results:  Lab Results  Component Value Date   WBC 8.3 08/15/2023   HGB 12.4 (L) 08/15/2023   HCT 36.1 (L) 08/15/2023   MCV 98.4 08/15/2023   PLT 102 (L) 08/15/2023   NEUTROABS 5.3 08/15/2023    Imaging:  No results found.  Medications: I have reviewed the patient's current medications.  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  Duodenitis.  Plastic biliary and pancreatic stents noted at the major papilla.  Pancreatic stent removed. Cycle 1 gemcitabine /cisplatin /Durvalumab  06/20/2023, day 8 06/28/2023 (Gemcitabine  dose reduced due to thrombocytopenia), Fulphila  Cycle 2 gemcitabine /cisplatin /Durvalumab  07/14/2023, day 8 07/21/2023, Fulphila  Cycle 3 gemcitabine /cisplatin /durvalumab  08/09/2023 Biliary obstruction secondary #1, status post placement of right and left hepatic duct stents 05/06/2023 Prostate cancer, status post prostatectomy in 2018,pT3a,pN0, Gleason 7 COPD Hypertension Hyperlipidemia Tobacco use BRCA2 gene mutation-referred to genetics 07/13/2023 Right lower quadrant pain/tenderness-referred for CTs 07/20/2023 New onset atrial flutter 08/15/2023  Disposition: Robert [name] appears stable.  He is completing cycle 3 gemcitabine /cisplatin /Durvalumab .  Plan to proceed with day 8 on 08/16/2023 as scheduled.  Gemcitabine  will be dose reduced due to mild thrombocytopenia.  We will follow-up with Dr. Romero regarding the restaging evaluation.  CBC and chemistry panel reviewed.  Labs adequate for treatment.  Mild thrombocytopenia as noted above.  He understands to contact the office with bleeding.  Found to have new onset atrial flutter today.  We reviewed with cardiology.  He will begin Eliquis  5 mg twice daily.  We reviewed the risk of bleeding.  Rate currently controlled so beta-blocker not initiated.  He is mildly hypotensive.  Lotensin  will be adjusted to half dose.  He will see Dr. Lonni 08/19/2023.  He will return for follow-up 08/31/2023.  We are available to see him sooner if  needed.  Patient seen with Dr. Cloretta.  [name] ANP/GNP-BC   08/15/2023  4:15 PM  This was a shared visit with [name].  Robert [name] was interviewed and examined.  He is scheduled for day 8 cycle 3 gemcitabine /cisplatin /durvalumab  tomorrow.  He continues to tolerate the chemotherapy well. He was noted to have tachycardia with an irregular heart rate today.  An EKG confirmed atrial flutter.  We reviewed the EKG with cardiology.  Anticoagulation is recommended.  He will see cardiology later this week.  The plan is to proceed with day 8 cycle 3 chemotherapy tomorrow.  I will contact Dr. Romero to plan a restaging evaluation.  I was present for greater than 50% of today's visit.  I performed medical decision making.  Arvella Cloretta, MD

## 2023-08-15 NOTE — Patient Instructions (Signed)

## 2023-08-15 NOTE — Addendum Note (Signed)
 Addended by: CLORETTA KUBA B on: 08/15/2023 06:57 PM   Modules accepted: Orders

## 2023-08-16 ENCOUNTER — Other Ambulatory Visit

## 2023-08-16 ENCOUNTER — Inpatient Hospital Stay

## 2023-08-16 ENCOUNTER — Ambulatory Visit: Admitting: Nurse Practitioner

## 2023-08-16 VITALS — BP 116/77 | HR 65 | Temp 97.5°F | Resp 18

## 2023-08-16 DIAGNOSIS — I10 Essential (primary) hypertension: Secondary | ICD-10-CM | POA: Diagnosis not present

## 2023-08-16 DIAGNOSIS — C61 Malignant neoplasm of prostate: Secondary | ICD-10-CM | POA: Diagnosis not present

## 2023-08-16 DIAGNOSIS — J449 Chronic obstructive pulmonary disease, unspecified: Secondary | ICD-10-CM | POA: Diagnosis not present

## 2023-08-16 DIAGNOSIS — E785 Hyperlipidemia, unspecified: Secondary | ICD-10-CM | POA: Diagnosis not present

## 2023-08-16 DIAGNOSIS — I4892 Unspecified atrial flutter: Secondary | ICD-10-CM | POA: Diagnosis not present

## 2023-08-16 DIAGNOSIS — Z7901 Long term (current) use of anticoagulants: Secondary | ICD-10-CM | POA: Diagnosis not present

## 2023-08-16 DIAGNOSIS — C221 Intrahepatic bile duct carcinoma: Secondary | ICD-10-CM | POA: Diagnosis not present

## 2023-08-16 DIAGNOSIS — Z79899 Other long term (current) drug therapy: Secondary | ICD-10-CM | POA: Diagnosis not present

## 2023-08-16 DIAGNOSIS — Z5111 Encounter for antineoplastic chemotherapy: Secondary | ICD-10-CM | POA: Diagnosis not present

## 2023-08-16 DIAGNOSIS — D696 Thrombocytopenia, unspecified: Secondary | ICD-10-CM | POA: Diagnosis not present

## 2023-08-16 DIAGNOSIS — C240 Malignant neoplasm of extrahepatic bile duct: Secondary | ICD-10-CM

## 2023-08-16 MED ORDER — SODIUM CHLORIDE 0.9% FLUSH: 10.0000 mL | INTRAVENOUS | Status: DC | PRN

## 2023-08-16 MED ORDER — POTASSIUM CHLORIDE IN NACL 20-0.9 MEQ/L-% IV SOLN
Freq: Once | INTRAVENOUS | Status: AC
  Filled 2023-08-16: qty 1000

## 2023-08-16 MED ORDER — HEPARIN SOD (PORK) LOCK FLUSH 100 UNIT/ML IV SOLN
500.0000 [IU] | Freq: Once | INTRAVENOUS | Status: DC | PRN
Start: 2023-08-16 — End: 2023-08-16

## 2023-08-16 MED ORDER — SODIUM CHLORIDE 0.9 % IV SOLN: INTRAVENOUS | Status: DC

## 2023-08-16 MED ORDER — SODIUM CHLORIDE 0.9 % IV SOLN
150.0000 mg | Freq: Once | INTRAVENOUS | Status: AC
  Administered 2023-08-16: 150 mg via INTRAVENOUS
  Filled 2023-08-16: qty 150

## 2023-08-16 MED ORDER — SODIUM CHLORIDE 0.9 % IV SOLN
25.0000 mg/m2 | Freq: Once | INTRAVENOUS | Status: AC
  Administered 2023-08-16: 50 mg via INTRAVENOUS
  Filled 2023-08-16: qty 50

## 2023-08-16 MED ORDER — DEXAMETHASONE SODIUM PHOSPHATE 10 MG/ML IJ SOLN
10.0000 mg | Freq: Once | INTRAMUSCULAR | Status: AC
  Administered 2023-08-16: 10 mg via INTRAVENOUS
  Filled 2023-08-16: qty 1

## 2023-08-16 MED ORDER — PALONOSETRON HCL INJECTION 0.25 MG/5ML
0.2500 mg | Freq: Once | INTRAVENOUS | Status: AC
  Administered 2023-08-16: 0.25 mg via INTRAVENOUS
  Filled 2023-08-16: qty 5

## 2023-08-16 MED ORDER — MAGNESIUM SULFATE 2 GM/50ML IV SOLN
2.0000 g | Freq: Once | INTRAVENOUS | Status: AC
  Administered 2023-08-16: 2 g via INTRAVENOUS
  Filled 2023-08-16: qty 50

## 2023-08-16 MED ORDER — SODIUM CHLORIDE 0.9 % IV SOLN
800.0000 mg/m2 | Freq: Once | INTRAVENOUS | Status: AC
  Administered 2023-08-16: 1710 mg via INTRAVENOUS
  Filled 2023-08-16: qty 25.98

## 2023-08-16 NOTE — Progress Notes (Signed)
 Per Olam Rathke: contraindicated to run Cisplatin  concurrently with post-hydration IVFs given new onset of atrial flutter. 2-hour post hydration fluids will resume once Cisplatin  is completed

## 2023-08-16 NOTE — Patient Instructions (Signed)
 [facility] - A DEPT OF [place]  Discharge Instructions: Thank you for choosing [facility] to provide your oncology and hematology care.   If you have a lab appointment with the Cancer Center, please go directly to the Cancer Center and check in at the registration area.   Wear comfortable clothing and clothing appropriate for easy access to any Portacath or PICC line.   We strive to give you quality time with your provider. You may need to reschedule your appointment if you arrive late (15 or more minutes).  Arriving late affects you and other patients whose appointments are after yours.  Also, if you miss three or more appointments without notifying the office, you may be dismissed from the clinic at the provider's discretion.      For prescription refill requests, have your pharmacy contact our office and allow 72 hours for refills to be completed.    Today you received the following chemotherapy and/or immunotherapy agents: gemcitabine  and cisplatin .      To help prevent nausea and vomiting after your treatment, we encourage you to take your nausea medication as directed.  BELOW ARE SYMPTOMS THAT SHOULD BE REPORTED IMMEDIATELY: *FEVER GREATER THAN 100.4 F (38 C) OR HIGHER *CHILLS OR SWEATING *NAUSEA AND VOMITING THAT IS NOT CONTROLLED WITH YOUR NAUSEA MEDICATION *UNUSUAL SHORTNESS OF BREATH *UNUSUAL BRUISING OR BLEEDING *URINARY PROBLEMS (pain or burning when urinating, or frequent urination) *BOWEL PROBLEMS (unusual diarrhea, constipation, pain near the anus) TENDERNESS IN MOUTH AND THROAT WITH OR WITHOUT PRESENCE OF ULCERS (sore throat, sores in mouth, or a toothache) UNUSUAL RASH, SWELLING OR PAIN  UNUSUAL VAGINAL DISCHARGE OR ITCHING   Items with * indicate a potential emergency and should be followed up as soon as possible or go to the Emergency Department if any problems should occur.  Please show the CHEMOTHERAPY ALERT CARD  or IMMUNOTHERAPY ALERT CARD at check-in to the Emergency Department and triage nurse.  Should you have questions after your visit or need to cancel or reschedule your appointment, please contact [facility] - A DEPT OF [facility]  Dept: [phone]  and follow the prompts.  Office hours are 8:00 a.m. to 4:30 p.m. Monday - Friday. Please note that voicemails left after 4:00 p.m. may not be returned until the following business day.  We are closed weekends and major holidays. You have access to a nurse at all times for urgent questions. Please call the main number to the clinic Dept: [phone] and follow the prompts.   For any non-urgent questions, you may also contact your provider using MyChart. We now offer e-Visits for anyone 20 and older to request care online for non-urgent symptoms. For details visit [email or web address].   Also download the MyChart app! Go to the app store, search MyChart, open the app, select [place], and log in with your MyChart username and password.

## 2023-08-17 ENCOUNTER — Ambulatory Visit

## 2023-08-18 ENCOUNTER — Inpatient Hospital Stay

## 2023-08-18 VITALS — BP 135/63 | HR 63 | Temp 98.2°F | Resp 18

## 2023-08-18 DIAGNOSIS — Z7901 Long term (current) use of anticoagulants: Secondary | ICD-10-CM | POA: Diagnosis not present

## 2023-08-18 DIAGNOSIS — C61 Malignant neoplasm of prostate: Secondary | ICD-10-CM | POA: Diagnosis not present

## 2023-08-18 DIAGNOSIS — Z5111 Encounter for antineoplastic chemotherapy: Secondary | ICD-10-CM | POA: Diagnosis not present

## 2023-08-18 DIAGNOSIS — I4892 Unspecified atrial flutter: Secondary | ICD-10-CM | POA: Diagnosis not present

## 2023-08-18 DIAGNOSIS — C240 Malignant neoplasm of extrahepatic bile duct: Secondary | ICD-10-CM

## 2023-08-18 DIAGNOSIS — A419 Sepsis, unspecified organism: Secondary | ICD-10-CM | POA: Diagnosis not present

## 2023-08-18 DIAGNOSIS — E785 Hyperlipidemia, unspecified: Secondary | ICD-10-CM | POA: Diagnosis not present

## 2023-08-18 DIAGNOSIS — I10 Essential (primary) hypertension: Secondary | ICD-10-CM | POA: Diagnosis not present

## 2023-08-18 DIAGNOSIS — J449 Chronic obstructive pulmonary disease, unspecified: Secondary | ICD-10-CM | POA: Diagnosis not present

## 2023-08-18 DIAGNOSIS — D696 Thrombocytopenia, unspecified: Secondary | ICD-10-CM | POA: Diagnosis not present

## 2023-08-18 DIAGNOSIS — C221 Intrahepatic bile duct carcinoma: Secondary | ICD-10-CM | POA: Diagnosis not present

## 2023-08-18 DIAGNOSIS — J441 Chronic obstructive pulmonary disease with (acute) exacerbation: Secondary | ICD-10-CM | POA: Diagnosis not present

## 2023-08-18 DIAGNOSIS — J189 Pneumonia, unspecified organism: Secondary | ICD-10-CM | POA: Diagnosis not present

## 2023-08-18 DIAGNOSIS — Z79899 Other long term (current) drug therapy: Secondary | ICD-10-CM | POA: Diagnosis not present

## 2023-08-18 MED ORDER — PEGFILGRASTIM-JMDB 6 MG/0.6ML ~~LOC~~ SOSY
6.0000 mg | PREFILLED_SYRINGE | Freq: Once | SUBCUTANEOUS | Status: AC
Start: 2023-08-18 — End: 2023-08-18
  Administered 2023-08-18: 6 mg via SUBCUTANEOUS
  Filled 2023-08-18: qty 0.6

## 2023-08-18 NOTE — Patient Instructions (Signed)
 [facility] - A DEPT OF [place]  Discharge Instructions: Thank you for choosing [facility] to provide your oncology and hematology care.   If you have a lab appointment with the Cancer Center, please go directly to the Cancer Center and check in at the registration area.   Wear comfortable clothing and clothing appropriate for easy access to any Portacath or PICC line.   We strive to give you quality time with your provider. You may need to reschedule your appointment if you arrive late (15 or more minutes).  Arriving late affects you and other patients whose appointments are after yours.  Also, if you miss three or more appointments without notifying the office, you may be dismissed from the clinic at the provider's discretion.      For prescription refill requests, have your pharmacy contact our office and allow 72 hours for refills to be completed.    Today you received the following chemotherapy and/or immunotherapy agents: fulphila .      To help prevent nausea and vomiting after your treatment, we encourage you to take your nausea medication as directed.  BELOW ARE SYMPTOMS THAT SHOULD BE REPORTED IMMEDIATELY: *FEVER GREATER THAN 100.4 F (38 C) OR HIGHER *CHILLS OR SWEATING *NAUSEA AND VOMITING THAT IS NOT CONTROLLED WITH YOUR NAUSEA MEDICATION *UNUSUAL SHORTNESS OF BREATH *UNUSUAL BRUISING OR BLEEDING *URINARY PROBLEMS (pain or burning when urinating, or frequent urination) *BOWEL PROBLEMS (unusual diarrhea, constipation, pain near the anus) TENDERNESS IN MOUTH AND THROAT WITH OR WITHOUT PRESENCE OF ULCERS (sore throat, sores in mouth, or a toothache) UNUSUAL RASH, SWELLING OR PAIN  UNUSUAL VAGINAL DISCHARGE OR ITCHING   Items with * indicate a potential emergency and should be followed up as soon as possible or go to the Emergency Department if any problems should occur.  Please show the CHEMOTHERAPY ALERT CARD or IMMUNOTHERAPY  ALERT CARD at check-in to the Emergency Department and triage nurse.  Should you have questions after your visit or need to cancel or reschedule your appointment, please contact [facility] - [facility] DEPT [facility]  Dept: [phone]  and follow the prompts.  Office hours are 8:00 a.m. to 4:30 p.m. Monday - Friday. Please note that voicemails left after 4:00 p.m. may not be returned until the following business day.  We are closed weekends and major holidays. You have access to a nurse at all times for urgent questions. Please call the main number to the clinic Dept: [phone] and follow the prompts.   For any non-urgent questions, you may also contact your provider using MyChart. We now offer e-Visits for anyone 66 and older to request care online for non-urgent symptoms. For details visit [email or web address].   Also download the MyChart app! Go to the app store, search MyChart, open the app, select , and log in with your MyChart username and password.

## 2023-08-19 ENCOUNTER — Ambulatory Visit (INDEPENDENT_AMBULATORY_CARE_PROVIDER_SITE_OTHER): Admitting: Cardiology

## 2023-08-19 ENCOUNTER — Encounter (HOSPITAL_BASED_OUTPATIENT_CLINIC_OR_DEPARTMENT_OTHER): Payer: Self-pay | Admitting: Cardiology

## 2023-08-19 ENCOUNTER — Ambulatory Visit

## 2023-08-19 VITALS — BP 128/64 | HR 82 | Ht 66.0 in | Wt 221.0 lb

## 2023-08-19 DIAGNOSIS — I10 Essential (primary) hypertension: Secondary | ICD-10-CM

## 2023-08-19 DIAGNOSIS — I483 Typical atrial flutter: Secondary | ICD-10-CM | POA: Diagnosis not present

## 2023-08-19 DIAGNOSIS — D6869 Other thrombophilia: Secondary | ICD-10-CM

## 2023-08-19 DIAGNOSIS — Z7901 Long term (current) use of anticoagulants: Secondary | ICD-10-CM

## 2023-08-19 DIAGNOSIS — C240 Malignant neoplasm of extrahepatic bile duct: Secondary | ICD-10-CM

## 2023-08-19 NOTE — Progress Notes (Signed)
  Cardiology Office Note:  .   Date:  08/19/2023  ID:  Robert [name], DOB 1955-12-14, MRN [id number] PCP: Omar Cumins, FNP  [facility] Providers Cardiologist:  Shelda Bruckner, MD {  History of Present Illness: Robert   Demaris [name] is a 68 y.o. 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  Today: Presented to oncology for chemotherapy on 8/4. Noted to have irregular heart rate. ECG done, noted new atrial flutter. Started on apixaban . Rate controlled, but was slightly hypotensive, so lotensin  decreased. Scheduled for urgent visit.  Wasn't feeling heart racing, chest pain, or other symptoms. Had never been told he has heart issues before. In flutter still today, rate controlled, asymptomatic.  We reviewed atrial flutter, management options, see below. Has not noticed any bleeding. Reviewed labs together.   ROS: Denies chest pain, shortness of breath worse than baseline. No PND, orthopnea, LE edema or unexpected weight gain. No syncope or palpitations. ROS otherwise negative except as noted.   Studies Reviewed: Robert    EKG:  EKG Interpretation Date/Time:  Friday August 19 2023 14:51:19 EDT Ventricular Rate:  82 PR Interval:    QRS Duration:  90 QT Interval:  346 QTC Calculation: 404 R Axis:   10  Text Interpretation: Atrial flutter with 4:1 A-V conduction Confirmed by Bruckner Shelda [phone]) on 08/19/2023 3:30:35 PM    Physical Exam:   VS:  BP 128/64   Pulse 82   Ht 5' 6 (1.676 m)   Wt 221 lb (100.2 kg)   SpO2 93%   BMI 35.67 kg/m    Wt Readings from Last 3 Encounters:  08/19/23 221 lb (100.2 kg)  08/15/23 213 lb 8 oz (96.8 kg)  08/08/23 211 lb 14.4 oz (96.1 kg)    GEN: Well nourished, well developed in no acute distress HEENT: Normal, moist mucous  membranes NECK: No JVD CARDIAC: regular rhythm, normal S1 and S2, no rubs or gallops. No murmur. VASCULAR: Radial and DP pulses 2+ bilaterally. No carotid bruits RESPIRATORY:  Clear to auscultation without rales, wheezing or rhonchi  ABDOMEN: Soft, non-tender, non-distended MUSCULOSKELETAL:  Ambulates independently SKIN: Warm and dry, no edema NEUROLOGIC:  Alert and oriented x 3. No focal neuro deficits noted. PSYCHIATRIC:  Normal affect    ASSESSMENT AND PLAN: .    Atrial flutter -will plan for cardioversion 9/4, discussed today. Has routine labs with cancer center -continue apixaban , denies bleeding -CHA2DS2/VAS Stroke Risk Points=2  -ordered echo    Cholangiocarcinoma, on chemotherapy -managed closely by oncology team, also followed at [facility]  Hypertension -BP well controlled on current regimen  CV risk counseling and prevention -recommend heart healthy/Mediterranean diet, with whole grains, fruits, vegetable, fish, lean meats, nuts, and olive oil. Limit salt. -recommend moderate walking, 3-5 times/week for 30-50 minutes each session. Aim for at least 150 minutes.week. Goal should be pace of 3 miles/hours, or walking 1.5 miles in 30 minutes -recommend avoidance of tobacco products. Avoid excess alcohol.  Dispo: 3 mos or sooner as needed  Signed, Shelda Bruckner, MD   Shelda Bruckner, MD, PhD, [facility] [place]  [facility]  [place]  [facility] at [facility] at [facility] [address] [place], [place] [address] [phone]

## 2023-08-19 NOTE — Patient Instructions (Addendum)
 Medication Instructions:  Your physician recommends that you continue on your current medications as directed. Please refer to the Current Medication list given to you today.  *If you need a refill on your cardiac medications before your next appointment, please call your pharmacy*  Lab Work: AS SCHEDULED  If you have labs (blood work) drawn today and your tests are completely normal, you will receive your results only by: MyChart Message (if you have MyChart) OR A paper copy in the mail If you have any lab test that is abnormal or we need to change your treatment, we will call you to review the results.  Testing/Procedures: CARDIOVERSION - SEE BELOW   Your physician has requested that you have an echocardiogram. Echocardiography is a painless test that uses sound waves to create images of your heart. It provides your doctor with information about the size and shape of your heart and how well your heart's chambers and valves are working. This procedure takes approximately one hour. There are no restrictions for this procedure. Please do NOT wear cologne, perfume, aftershave, or lotions (deodorant is allowed). Please arrive 15 minutes prior to your appointment time.  Please note: We ask at that you not bring children with you during ultrasound (echo/ vascular) testing. Due to room size and safety concerns, children are not allowed in the ultrasound rooms during exams. Our front office staff cannot provide observation of children in our lobby area while testing is being conducted. An adult accompanying a patient to their appointment will only be allowed in the ultrasound room at the discretion of the ultrasound technician under special circumstances. We apologize for any inconvenience.  Follow-Up: At [facility], you and your health needs are our priority.  As part of our continuing mission to provide you with exceptional heart care, our providers are all part of one team.  This team  includes your primary Cardiologist (physician) and Advanced Practice Providers or APPs (Physician Assistants and Nurse Practitioners) who all work together to provide you with the care you need, when you need it.  Your next appointment:   3 month(s)  Provider:   Shelda Bruckner, MD, Rosaline Bane, NP, or Reche Finder, NP     We recommend signing up for the patient portal called MyChart.  Sign up information is provided on this After Visit Summary.  MyChart is used to connect with patients for Virtual Visits (Telemedicine).  Patients are able to view lab/test results, encounter notes, upcoming appointments, etc.  Non-urgent messages can be sent to your provider as well.   To learn more about what you can do with MyChart, go to [email or web address].   Other Instructions  You are scheduled for a Cardioversion on Thursday, September 4 with Dr. BRUCKNER .  Please arrive at the [facility] (Main Entrance A) at [facility]: [address] [place], [place] [address] at 7:30 AM (This time is 1 hour(s) before your procedure to ensure your preparation).   Free valet parking service is available. You will check in at ADMITTING.   *Please Note: You will receive a call the day before your procedure to confirm the appointment time. That time may have changed from the original time based on the schedule for that day.*    DIET:  Nothing to eat or drink after midnight except a sip of water with medications (see medication instructions below)  MEDICATION INSTRUCTIONS: !!IF ANY NEW MEDICATIONS ARE STARTED AFTER TODAY, PLEASE NOTIFY YOUR PROVIDER AS SOON AS POSSIBLE!!  FYI:  Medications such as Semaglutide  (Ozempic , Wegovy ), Tirzepatide (Mounjaro, Zepbound), Dulaglutide (Trulicity), etc (GLP1 agonists) AND Canagliflozin (Invokana), Dapagliflozin (Farxiga), Empagliflozin (Jardiance), Ertugliflozin (Steglatro), Bexagliflozin [employer]) or any combination with one of these drugs such as  Invokamet (Canagliflozin/Metformin ), Synjardy (Empagliflozin/Metformin ), etc (SGLT2 inhibitors) must be held around the time of a procedure. This is not a comprehensive list of all of these drugs. Please review all of your medications and talk to your provider if you take any one of these. If you are not sure, ask your provider.   Continue taking your anticoagulant (blood thinner): Apixaban  (Eliquis ).  You will need to continue this after your procedure until you are told by your provider that it is safe to stop.    LABS:  Come to AS SCHEDULED   FYI:  For your safety, and to allow us  to monitor your vital signs accurately during the surgery/procedure we request: If you have artificial nails, gel coating, SNS etc, please have those removed prior to your surgery/procedure. Not having the nail coverings /polish removed may result in cancellation or delay of your surgery/procedure.  Your support person will be asked to wait in the waiting room during your procedure.  It is OK to have someone drop you off and come back when you are ready to be discharged.  You cannot drive after the procedure and will need someone to drive you home.  Bring your insurance cards.  *Special Note: Every effort is made to have your procedure done on time. Occasionally there are emergencies that occur at the hospital that may cause delays. Please be patient if a delay does occur.

## 2023-08-20 ENCOUNTER — Other Ambulatory Visit: Payer: Self-pay

## 2023-08-29 ENCOUNTER — Ambulatory Visit: Admitting: Nurse Practitioner

## 2023-08-29 ENCOUNTER — Other Ambulatory Visit

## 2023-08-30 ENCOUNTER — Ambulatory Visit

## 2023-08-30 NOTE — Progress Notes (Unsigned)
 [facility]   Telephone:(336) [phone] Fax:(336) [phone]    Patient Care Team: Omar Cumins, FNP as PCP - General (Pain Medicine) Lonni Slain, MD as PCP - Cardiology (Cardiology) Nori Sari SQUIBB, RN as Oncology Nurse Navigator   CHIEF COMPLAINT: Follow up cholangiocarcinoma   CURRENT THERAPY: Gemcitabine /cisplatin /durvalumab   INTERVAL HISTORY Robert [name] returns for follow up as scheduled. Last seen 08/15/23 with C3D8 gem/cis. Tolerating well except mild to moderate fatigue. Day 3 is the worst but still working. Eating/drinking well, 211-215 is his usual weight. Denies n/v/c/d, pain, neuropathy, fever, chills, cough, chest pain, dyspnea, bleeding, or other specific complaints.    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   COPD (chronic obstructive pulmonary disease) (HCC)    Elevated hemoglobin A1c    Elevated PSA 03/16/2016   Hypertension    Personal history of colonic adenomas 12/28/2007   Vitamin D  deficiency      Past Surgical History:  Procedure Laterality Date   BILIARY BRUSHING  05/06/2023   Procedure: BRUSH BIOPSY, BILE DUCT;  Surgeon: Wilhelmenia Aloha Raddle., MD;  Location: [facility] ENDOSCOPY;  Service: Gastroenterology;;   BILIARY STENT PLACEMENT  05/06/2023   Procedure: INSERTION, STENT, BILE DUCT;  Surgeon: Wilhelmenia Aloha Raddle., MD;  Location: [facility] ENDOSCOPY;  Service: Gastroenterology;;   BIOPSY OF SKIN SUBCUTANEOUS TISSUE AND/OR MUCOUS MEMBRANE  05/06/2023   Procedure: BIOPSY, SKIN, SUBCUTANEOUS TISSUE, OR MUCOUS MEMBRANE;  Surgeon: Wilhelmenia Aloha Raddle., MD;  Location: [facility] ENDOSCOPY;  Service: Gastroenterology;;   COLONOSCOPY  2009   CG  hems, TAs   ERCP N/A 05/06/2023   Procedure: ERCP, WITH INTERVENTION IF INDICATED;  Surgeon: Wilhelmenia Aloha Raddle., MD;  Location: [facility] ENDOSCOPY;  Service: Gastroenterology;  Laterality: N/A;    ESOPHAGOGASTRODUODENOSCOPY N/A 06/16/2023   Procedure: EGD (ESOPHAGOGASTRODUODENOSCOPY);  Surgeon: Wilhelmenia Aloha Raddle., MD;  Location: [facility] ENDOSCOPY;  Service: Gastroenterology;  Laterality: N/A;   IR IMAGING GUIDED PORT INSERTION  06/10/2023   LYMPHADENECTOMY N/A 06/28/2016   Procedure: LYMPHADENECTOMY;  Surgeon: Renda Glance, MD;  Location: [facility] ORS;  Service: Urology;  Laterality: N/A;   PANCREATIC STENT PLACEMENT  05/06/2023   Procedure: INSERTION, STENT, PANCREATIC DUCT;  Surgeon: Wilhelmenia Aloha Raddle., MD;  Location: [facility] ENDOSCOPY;  Service: Gastroenterology;;   PROSTATE BIOPSY  03/16/2016   Surgical Pathology Gross and Microscopic Examination for Prostate Needle   ROBOT ASSISTED LAPAROSCOPIC RADICAL PROSTATECTOMY N/A 06/28/2016   Procedure: XI ROBOTIC ASSISTED LAPAROSCOPIC RADICAL PROSTATECTOMY LEVEL 2 EXCISION OF PENILE LESION;  Surgeon: Renda Glance, MD;  Location: [facility] ORS;  Service: Urology;  Laterality: N/A;     Outpatient Encounter Medications as of 08/31/2023  Medication Sig   albuterol  (VENTOLIN  HFA) 108 (90 Base) MCG/ACT inhaler Inhale 2 puffs into the lungs every 6 (six) hours as needed for wheezing or shortness of breath.   apixaban  (ELIQUIS ) 5 MG TABS tablet Take 1 tablet (5 mg total) by mouth 2 (two) times daily.   benazepril -hydrochlorthiazide (LOTENSIN  HCT) 20-25 MG tablet Take 1 tablet Daily for BP                                               /  TAKE                                         BY                                                 MOUTH   BREZTRI  AEROSPHERE 160-9-4.8 MCG/ACT AERO inhaler Inhale 2 puffs into the lungs 2 (two) times daily.   ondansetron  (ZOFRAN ) 8 MG tablet Take 1 tablet (8 mg total) by mouth every 8 (eight) hours as needed (May use 3 days after chemo as needed for nausea). (Patient not taking: Reported on 08/31/2023)   Facility-Administered Encounter Medications as of 08/31/2023   Medication   pegfilgrastim -jmdb (FULPHILA ) injection 6 mg     Today's Vitals   08/31/23 1020 08/31/23 1031 08/31/23 1038  BP: (!) 124/58  128/62  Pulse: 82    Resp: 16    Temp: 98.3 F (36.8 C)    TempSrc: Temporal    SpO2: 97%    Weight: 214 lb 1.6 oz (97.1 kg)    Height: 5' 6 (1.676 m)    PainSc:  0-No pain    Body mass index is 34.56 kg/m.   ECOG PERFORMANCE STATUS: 1 - Symptomatic but completely ambulatory  PHYSICAL EXAM GENERAL:alert, no distress and comfortable SKIN: no rash  HEENT: sclera clear. No thrush or ulcers NECK: without mass LYMPH:  no palpable cervical or supraclavicular lymphadenopathy  LUNGS: clear with normal breathing effort HEART: regular rate & rhythm, no lower extremity edema ABDOMEN: abdomen soft, non-tender and normal bowel sounds NEURO: alert & oriented x 3 with fluent speech, no focal motor/sensory deficits PAC without erythema    CBC    Latest Ref Rng & Units 08/31/2023   10:10 AM 08/15/2023    1:26 PM 08/08/2023    1:28 PM  CBC  WBC 4.0 - 10.5 K/uL 13.0  8.3  15.5   Hemoglobin 13.0 - 17.0 g/dL 88.8  87.5  86.4   Hematocrit 39.0 - 52.0 % 32.3  36.1  40.3   Platelets 150 - 400 K/uL 171  102  249       CMP     Latest Ref Rng & Units 08/31/2023   10:10 AM 08/15/2023    1:26 PM 08/08/2023    1:28 PM  CMP  Glucose 70 - 99 mg/dL 862  890  866   BUN 8 - 23 mg/dL 8  16  15    Creatinine 0.61 - 1.24 mg/dL 9.36  9.32  9.25   Sodium 135 - 145 mmol/L 131  134  137   Potassium 3.5 - 5.1 mmol/L 3.5  3.9  3.9   Chloride 98 - 111 mmol/L 93  95  98   CO2 22 - 32 mmol/L 26  28  27    Calcium  8.9 - 10.3 mg/dL 9.5  89.8  9.8   Total Protein 6.5 - 8.1 g/dL 6.4  6.6  6.9   Total Bilirubin 0.0 - 1.2 mg/dL 0.4  0.6  0.3   Alkaline Phos 38 - 126 U/L 101  82  122   AST 15 - 41 U/L 24  22  23    ALT 0 - 44 U/L 17  36  18  ASSESSMENT & PLAN: Cholangiocarcinoma-perihilar, Bismuth IV 05/02/2023: CT abdomen/pelvis-moderate intrahepatic biliary  dilation, possible mass at the porta hepatis and proximal common duct 05/03/2023: MRI/MRCP-moderate intrahepatic biliary duct dilation secondary to a 3.4 cm soft tissue mass at the porta hepatis, mild porta hepatis adenopathy present back to 2018 favored reactive ERCP 05/06/2023: 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  Duodenitis.  Plastic biliary and pancreatic stents noted at the major papilla.  Pancreatic stent removed. Cycle 1 gemcitabine /cisplatin /Durvalumab  06/20/2023, day 8 06/28/2023 (Gemcitabine  dose reduced due to thrombocytopenia), Fulphila  Cycle 2 gemcitabine /cisplatin /Durvalumab  07/14/2023, day 8 07/21/2023, Fulphila  Cycle 3 gemcitabine /cisplatin /durvalumab  08/09/2023 (Gem dose reduced C3D8 for thrombocytopenia) Cycle 4 Day 1 gem/cisplatin /durvalumab  08/31/23, gem dose reduced  Biliary obstruction secondary #1, status post placement of right and left hepatic duct stents 05/06/2023 Prostate cancer, status post prostatectomy in 2018,pT3a,pN0, Gleason 7 COPD Hypertension Hyperlipidemia Tobacco use BRCA2 gene mutation-referred to genetics 07/13/2023 Right lower quadrant pain/tenderness-referred for CTs 07/20/2023 New onset atrial flutter 08/15/2023 - on Eliquis ,   Disposition:  Robert [name] appears stable, s/p Cycle 3 Gem/cis/durvalumab . Gemcitabine  dose reduced C3D8 for mild thrombocytopenia. Tolerating treatment very well, mostly fatigue.  SEs are adequately controlled with supportive care at home. Able to recover and function with good PS. There is no clinical evidence of disease progression.   Heart rhythm appears normal today. He is scheduled for tentative cardioversion 9/4.   Labs reviewed, adequate for C4 D1 Gem/cis/durvalumab  on 8/21 with gemcitabine  dose reduction. He will return for follow up and D8 next week.   He is scheduled for restaging and f/up at [facility] on 9/12. We plan to see him back in 4 weeks with lab. Pt seen with Dr. Cloretta.    Orders Placed This Encounter  Procedures   CBC with Differential (Cancer Center Only)    Standing Status:   Future    Expected Date:   10/01/2023    Expiration Date:   08/30/2024   CMP (Cancer Center only)    Standing Status:   Future    Expected Date:   10/01/2023    Expiration Date:   08/30/2024      All questions were answered. The patient knows to call the clinic with any problems, questions or concerns. No barriers to learning were detected. I  Fahed Morten K Cashe Gatt, NP 08/31/2023   This was a shared visit with Marleen Moret.  Mr. Closson has completed 3 cycles of systemic therapy.  He has tolerated the treatment well.  He will complete another cycle prior to a restaging evaluation at [facility].  He is followed by cardiology for a recent diagnosis of atrial flutter.  Mr. Mounts is scheduled for day 1 cycle 4 chemotherapy tomorrow.  Arvella Cloretta, MD

## 2023-08-31 ENCOUNTER — Inpatient Hospital Stay

## 2023-08-31 ENCOUNTER — Inpatient Hospital Stay (HOSPITAL_BASED_OUTPATIENT_CLINIC_OR_DEPARTMENT_OTHER): Admitting: Nurse Practitioner

## 2023-08-31 ENCOUNTER — Encounter: Payer: Self-pay | Admitting: Nurse Practitioner

## 2023-08-31 VITALS — BP 128/62 | HR 82 | Temp 98.3°F | Resp 16 | Ht 66.0 in | Wt 214.1 lb

## 2023-08-31 DIAGNOSIS — C240 Malignant neoplasm of extrahepatic bile duct: Secondary | ICD-10-CM | POA: Diagnosis not present

## 2023-08-31 DIAGNOSIS — Z79899 Other long term (current) drug therapy: Secondary | ICD-10-CM | POA: Diagnosis not present

## 2023-08-31 DIAGNOSIS — C221 Intrahepatic bile duct carcinoma: Secondary | ICD-10-CM | POA: Diagnosis not present

## 2023-08-31 DIAGNOSIS — D696 Thrombocytopenia, unspecified: Secondary | ICD-10-CM | POA: Diagnosis not present

## 2023-08-31 DIAGNOSIS — Z5111 Encounter for antineoplastic chemotherapy: Secondary | ICD-10-CM | POA: Diagnosis not present

## 2023-08-31 DIAGNOSIS — E785 Hyperlipidemia, unspecified: Secondary | ICD-10-CM | POA: Diagnosis not present

## 2023-08-31 DIAGNOSIS — Z7901 Long term (current) use of anticoagulants: Secondary | ICD-10-CM | POA: Diagnosis not present

## 2023-08-31 DIAGNOSIS — I4892 Unspecified atrial flutter: Secondary | ICD-10-CM | POA: Diagnosis not present

## 2023-08-31 DIAGNOSIS — C61 Malignant neoplasm of prostate: Secondary | ICD-10-CM | POA: Diagnosis not present

## 2023-08-31 DIAGNOSIS — J449 Chronic obstructive pulmonary disease, unspecified: Secondary | ICD-10-CM | POA: Diagnosis not present

## 2023-08-31 DIAGNOSIS — I10 Essential (primary) hypertension: Secondary | ICD-10-CM | POA: Diagnosis not present

## 2023-08-31 LAB — CBC WITH DIFFERENTIAL (CANCER CENTER ONLY)
Abs Immature Granulocytes: 0.17 K/uL — ABNORMAL HIGH (ref 0.00–0.07)
Basophils Absolute: 0.1 K/uL (ref 0.0–0.1)
Basophils Relative: 1 %
Eosinophils Absolute: 0.1 K/uL (ref 0.0–0.5)
Eosinophils Relative: 0 %
HCT: 32.3 % — ABNORMAL LOW (ref 39.0–52.0)
Hemoglobin: 11.1 g/dL — ABNORMAL LOW (ref 13.0–17.0)
Immature Granulocytes: 1 %
Lymphocytes Relative: 12 %
Lymphs Abs: 1.6 K/uL (ref 0.7–4.0)
MCH: 33.5 pg (ref 26.0–34.0)
MCHC: 34.4 g/dL (ref 30.0–36.0)
MCV: 97.6 fL (ref 80.0–100.0)
Monocytes Absolute: 2.2 K/uL — ABNORMAL HIGH (ref 0.1–1.0)
Monocytes Relative: 17 %
Neutro Abs: 8.9 K/uL — ABNORMAL HIGH (ref 1.7–7.7)
Neutrophils Relative %: 69 %
Platelet Count: 171 K/uL (ref 150–400)
RBC: 3.31 MIL/uL — ABNORMAL LOW (ref 4.22–5.81)
RDW: 15.6 % — ABNORMAL HIGH (ref 11.5–15.5)
WBC Count: 13 K/uL — ABNORMAL HIGH (ref 4.0–10.5)
nRBC: 0 % (ref 0.0–0.2)

## 2023-08-31 LAB — MAGNESIUM: Magnesium: 1.7 mg/dL (ref 1.7–2.4)

## 2023-08-31 LAB — CMP (CANCER CENTER ONLY)
ALT: 17 U/L (ref 0–44)
AST: 24 U/L (ref 15–41)
Albumin: 3.5 g/dL (ref 3.5–5.0)
Alkaline Phosphatase: 101 U/L (ref 38–126)
Anion gap: 12 (ref 5–15)
BUN: 8 mg/dL (ref 8–23)
CO2: 26 mmol/L (ref 22–32)
Calcium: 9.5 mg/dL (ref 8.9–10.3)
Chloride: 93 mmol/L — ABNORMAL LOW (ref 98–111)
Creatinine: 0.63 mg/dL (ref 0.61–1.24)
GFR, Estimated: >60 mL/min (ref >60)
Glucose, Bld: 137 mg/dL — ABNORMAL HIGH (ref 70–99)
Potassium: 3.5 mmol/L (ref 3.5–5.1)
Sodium: 131 mmol/L — ABNORMAL LOW (ref 135–145)
Total Bilirubin: 0.4 mg/dL (ref 0.0–1.2)
Total Protein: 6.4 g/dL — ABNORMAL LOW (ref 6.5–8.1)

## 2023-09-01 ENCOUNTER — Inpatient Hospital Stay

## 2023-09-01 VITALS — BP 127/67 | HR 65 | Temp 97.7°F | Resp 18

## 2023-09-01 DIAGNOSIS — C240 Malignant neoplasm of extrahepatic bile duct: Secondary | ICD-10-CM

## 2023-09-01 DIAGNOSIS — C61 Malignant neoplasm of prostate: Secondary | ICD-10-CM | POA: Diagnosis not present

## 2023-09-01 DIAGNOSIS — Z7901 Long term (current) use of anticoagulants: Secondary | ICD-10-CM | POA: Diagnosis not present

## 2023-09-01 DIAGNOSIS — D696 Thrombocytopenia, unspecified: Secondary | ICD-10-CM | POA: Diagnosis not present

## 2023-09-01 DIAGNOSIS — I4892 Unspecified atrial flutter: Secondary | ICD-10-CM | POA: Diagnosis not present

## 2023-09-01 DIAGNOSIS — C221 Intrahepatic bile duct carcinoma: Secondary | ICD-10-CM | POA: Diagnosis not present

## 2023-09-01 DIAGNOSIS — E785 Hyperlipidemia, unspecified: Secondary | ICD-10-CM | POA: Diagnosis not present

## 2023-09-01 DIAGNOSIS — Z5111 Encounter for antineoplastic chemotherapy: Secondary | ICD-10-CM | POA: Diagnosis not present

## 2023-09-01 DIAGNOSIS — Z79899 Other long term (current) drug therapy: Secondary | ICD-10-CM | POA: Diagnosis not present

## 2023-09-01 DIAGNOSIS — I10 Essential (primary) hypertension: Secondary | ICD-10-CM | POA: Diagnosis not present

## 2023-09-01 DIAGNOSIS — J449 Chronic obstructive pulmonary disease, unspecified: Secondary | ICD-10-CM | POA: Diagnosis not present

## 2023-09-01 MED ORDER — MAGNESIUM SULFATE 2 GM/50ML IV SOLN
2.0000 g | Freq: Once | INTRAVENOUS | Status: AC
  Administered 2023-09-01: 2 g via INTRAVENOUS
  Filled 2023-09-01: qty 50

## 2023-09-01 MED ORDER — POTASSIUM CHLORIDE IN NACL 20-0.9 MEQ/L-% IV SOLN
Freq: Once | INTRAVENOUS | Status: AC
  Filled 2023-09-01: qty 1000

## 2023-09-01 MED ORDER — DEXAMETHASONE SODIUM PHOSPHATE 10 MG/ML IJ SOLN
10.0000 mg | Freq: Once | INTRAMUSCULAR | Status: AC
  Administered 2023-09-01: 10 mg via INTRAVENOUS
  Filled 2023-09-01: qty 1

## 2023-09-01 MED ORDER — SODIUM CHLORIDE 0.9 % IV SOLN
800.0000 mg/m2 | Freq: Once | INTRAVENOUS | Status: AC
  Administered 2023-09-01: 1710 mg via INTRAVENOUS
  Filled 2023-09-01: qty 25.98

## 2023-09-01 MED ORDER — SODIUM CHLORIDE 0.9 % IV SOLN
1500.0000 mg | Freq: Once | INTRAVENOUS | Status: AC
  Administered 2023-09-01: 1500 mg via INTRAVENOUS
  Filled 2023-09-01: qty 30

## 2023-09-01 MED ORDER — SODIUM CHLORIDE 0.9 % IV SOLN
150.0000 mg | Freq: Once | INTRAVENOUS | Status: AC
  Administered 2023-09-01: 150 mg via INTRAVENOUS
  Filled 2023-09-01: qty 150

## 2023-09-01 MED ORDER — PALONOSETRON HCL INJECTION 0.25 MG/5ML
0.2500 mg | Freq: Once | INTRAVENOUS | Status: AC
  Administered 2023-09-01: 0.25 mg via INTRAVENOUS
  Filled 2023-09-01: qty 5

## 2023-09-01 MED ORDER — SODIUM CHLORIDE 0.9 % IV SOLN
25.0000 mg/m2 | Freq: Once | INTRAVENOUS | Status: AC
  Administered 2023-09-01: 50 mg via INTRAVENOUS
  Filled 2023-09-01: qty 50

## 2023-09-01 MED ORDER — SODIUM CHLORIDE 0.9 % IV SOLN: INTRAVENOUS | Status: DC

## 2023-09-01 NOTE — Patient Instructions (Signed)
 [facility] - A DEPT OF [facility]  Discharge Instructions: Thank you for choosing [facility] to provide your oncology and hematology care.   If you have a lab appointment with the Cancer Center, please go directly to the Cancer Center and check in at the registration area.   Wear comfortable clothing and clothing appropriate for easy access to any Portacath or PICC line.   We strive to give you quality time with your provider. You may need to reschedule your appointment if you arrive late (15 or more minutes).  Arriving late affects you and other patients whose appointments are after yours.  Also, if you miss three or more appointments without notifying the office, you may be dismissed from the clinic at the provider's discretion.      For prescription refill requests, have your pharmacy contact our office and allow 72 hours for refills to be completed.    Today you received the following chemotherapy and/or immunotherapy agents: imfinzi , gemzar , cisplatin       To help prevent nausea and vomiting after your treatment, we encourage you to take your nausea medication as directed.  BELOW ARE SYMPTOMS THAT SHOULD BE REPORTED IMMEDIATELY: *FEVER GREATER THAN 100.4 F (38 C) OR HIGHER *CHILLS OR SWEATING *NAUSEA AND VOMITING THAT IS NOT CONTROLLED WITH YOUR NAUSEA MEDICATION *UNUSUAL SHORTNESS OF BREATH *UNUSUAL BRUISING OR BLEEDING *URINARY PROBLEMS (pain or burning when urinating, or frequent urination) *BOWEL PROBLEMS (unusual diarrhea, constipation, pain near the anus) TENDERNESS IN MOUTH AND THROAT WITH OR WITHOUT PRESENCE OF ULCERS (sore throat, sores in mouth, or a toothache) UNUSUAL RASH, SWELLING OR PAIN  UNUSUAL VAGINAL DISCHARGE OR ITCHING   Items with * indicate a potential emergency and should be followed up as soon as possible or go to the Emergency Department if any problems should occur.  Please show the CHEMOTHERAPY ALERT CARD  or IMMUNOTHERAPY ALERT CARD at check-in to the Emergency Department and triage nurse.  Should you have questions after your visit or need to cancel or reschedule your appointment, please contact [facility] - [facility] DEPT [facility]  Dept: [phone]  and follow the prompts.  Office hours are 8:00 a.m. to 4:30 p.m. Monday - Friday. Please note that voicemails left after 4:00 p.m. may not be returned until the following business day.  We are closed weekends and major holidays. You have access to a nurse at all times for urgent questions. Please call the main number to the clinic Dept: [phone] and follow the prompts.   For any non-urgent questions, you may also contact your provider using MyChart. We now offer e-Visits for anyone 35 and older to request care online for non-urgent symptoms. For details visit [email or web address].   Also download the MyChart app! Go to the app store, search MyChart, open the app, select [place], and log in with your MyChart username and password.

## 2023-09-05 ENCOUNTER — Ambulatory Visit: Admitting: Nurse Practitioner

## 2023-09-05 ENCOUNTER — Other Ambulatory Visit

## 2023-09-06 ENCOUNTER — Ambulatory Visit

## 2023-09-07 ENCOUNTER — Inpatient Hospital Stay

## 2023-09-07 ENCOUNTER — Encounter: Payer: Self-pay | Admitting: Nurse Practitioner

## 2023-09-07 ENCOUNTER — Other Ambulatory Visit

## 2023-09-07 ENCOUNTER — Ambulatory Visit: Admitting: Nurse Practitioner

## 2023-09-07 ENCOUNTER — Inpatient Hospital Stay (HOSPITAL_BASED_OUTPATIENT_CLINIC_OR_DEPARTMENT_OTHER): Admitting: Nurse Practitioner

## 2023-09-07 ENCOUNTER — Ambulatory Visit

## 2023-09-07 VITALS — BP 141/67 | HR 64 | Temp 97.8°F | Resp 18 | Ht 66.0 in | Wt 214.8 lb

## 2023-09-07 DIAGNOSIS — Z7901 Long term (current) use of anticoagulants: Secondary | ICD-10-CM | POA: Diagnosis not present

## 2023-09-07 DIAGNOSIS — Z5111 Encounter for antineoplastic chemotherapy: Secondary | ICD-10-CM | POA: Diagnosis not present

## 2023-09-07 DIAGNOSIS — J449 Chronic obstructive pulmonary disease, unspecified: Secondary | ICD-10-CM | POA: Diagnosis not present

## 2023-09-07 DIAGNOSIS — I10 Essential (primary) hypertension: Secondary | ICD-10-CM | POA: Diagnosis not present

## 2023-09-07 DIAGNOSIS — C240 Malignant neoplasm of extrahepatic bile duct: Secondary | ICD-10-CM | POA: Diagnosis not present

## 2023-09-07 DIAGNOSIS — E785 Hyperlipidemia, unspecified: Secondary | ICD-10-CM | POA: Diagnosis not present

## 2023-09-07 DIAGNOSIS — C221 Intrahepatic bile duct carcinoma: Secondary | ICD-10-CM | POA: Diagnosis not present

## 2023-09-07 DIAGNOSIS — I4892 Unspecified atrial flutter: Secondary | ICD-10-CM | POA: Diagnosis not present

## 2023-09-07 DIAGNOSIS — Z79899 Other long term (current) drug therapy: Secondary | ICD-10-CM | POA: Diagnosis not present

## 2023-09-07 DIAGNOSIS — C61 Malignant neoplasm of prostate: Secondary | ICD-10-CM | POA: Diagnosis not present

## 2023-09-07 DIAGNOSIS — D696 Thrombocytopenia, unspecified: Secondary | ICD-10-CM | POA: Diagnosis not present

## 2023-09-07 LAB — CMP (CANCER CENTER ONLY)
ALT: 29 U/L (ref 0–44)
AST: 28 U/L (ref 15–41)
Albumin: 3.8 g/dL (ref 3.5–5.0)
Alkaline Phosphatase: 78 U/L (ref 38–126)
Anion gap: 11 (ref 5–15)
BUN: 12 mg/dL (ref 8–23)
CO2: 28 mmol/L (ref 22–32)
Calcium: 9.5 mg/dL (ref 8.9–10.3)
Chloride: 97 mmol/L — ABNORMAL LOW (ref 98–111)
Creatinine: 0.6 mg/dL — ABNORMAL LOW (ref 0.61–1.24)
GFR, Estimated: >60 mL/min (ref >60)
Glucose, Bld: 121 mg/dL — ABNORMAL HIGH (ref 70–99)
Potassium: 4 mmol/L (ref 3.5–5.1)
Sodium: 136 mmol/L (ref 135–145)
Total Bilirubin: 0.3 mg/dL (ref 0.0–1.2)
Total Protein: 6.3 g/dL — ABNORMAL LOW (ref 6.5–8.1)

## 2023-09-07 LAB — CBC WITH DIFFERENTIAL (CANCER CENTER ONLY)
Abs Immature Granulocytes: 0.02 K/uL (ref 0.00–0.07)
Basophils Absolute: 0.1 K/uL (ref 0.0–0.1)
Basophils Relative: 1 %
Eosinophils Absolute: 0 K/uL (ref 0.0–0.5)
Eosinophils Relative: 1 %
HCT: 30.9 % — ABNORMAL LOW (ref 39.0–52.0)
Hemoglobin: 10.6 g/dL — ABNORMAL LOW (ref 13.0–17.0)
Immature Granulocytes: 0 %
Lymphocytes Relative: 51 %
Lymphs Abs: 2.7 K/uL (ref 0.7–4.0)
MCH: 33.4 pg (ref 26.0–34.0)
MCHC: 34.3 g/dL (ref 30.0–36.0)
MCV: 97.5 fL (ref 80.0–100.0)
Monocytes Absolute: 0.3 K/uL (ref 0.1–1.0)
Monocytes Relative: 6 %
Neutro Abs: 2.1 K/uL (ref 1.7–7.7)
Neutrophils Relative %: 41 %
Platelet Count: 165 K/uL (ref 150–400)
RBC: 3.17 MIL/uL — ABNORMAL LOW (ref 4.22–5.81)
RDW: 14.8 % (ref 11.5–15.5)
WBC Count: 5.2 K/uL (ref 4.0–10.5)
nRBC: 0 % (ref 0.0–0.2)

## 2023-09-07 LAB — MAGNESIUM: Magnesium: 1.8 mg/dL (ref 1.7–2.4)

## 2023-09-07 NOTE — Progress Notes (Signed)
  [facility] OFFICE PROGRESS NOTE   Diagnosis: Cholangiocarcinoma  INTERVAL HISTORY:   Robert [name] returns as scheduled.  He completed cycle 4-day 1 gemcitabine /cisplatin /Durvalumab  09/01/2023.  He denies nausea/vomiting.  No mouth sores.  No diarrhea.  No rash.  No hearing loss.  No tinnitus.  No numbness or tingling in the hands or feet.  He denies abdominal pain.  He has a good appetite.  No fever, cough, shortness of breath.  Objective:  Vital signs in last 24 hours:  Blood pressure (!) 141/67, pulse 64, temperature 97.8 F (36.6 C), temperature source Temporal, resp. rate 18, height 5' 6 (1.676 m), weight 214 lb 12.8 oz (97.4 kg), SpO2 100%.    HEENT: No thrush or ulcers. Resp: Scattered expiratory wheezes.  No respiratory distress. Cardio: Regular rate and rhythm. GI: No hepatosplenomegaly.  Nontender. Vascular: No leg edema. Skin: No rash. Port-A-Cath without erythema.  Lab Results:  Lab Results  Component Value Date   WBC 5.2 09/07/2023   HGB 10.6 (L) 09/07/2023   HCT 30.9 (L) 09/07/2023   MCV 97.5 09/07/2023   PLT 165 09/07/2023   NEUTROABS 2.1 09/07/2023    Imaging:  No results found.  Medications: I have reviewed the patient's current medications.  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  Duodenitis.  Plastic biliary and pancreatic stents noted at the major papilla.  Pancreatic stent removed. Cycle 1 gemcitabine /cisplatin /Durvalumab  06/20/2023, day 8 06/28/2023 (Gemcitabine  dose reduced due to thrombocytopenia), Fulphila  Cycle 2 gemcitabine /cisplatin /Durvalumab  07/14/2023, day 8 07/21/2023, Fulphila  Cycle 3 gemcitabine /cisplatin /durvalumab  08/09/2023 (Gem dose reduced C3D8 for thrombocytopenia) Cycle 4 Day 1 gem/cisplatin /durvalumab  08/31/23, gem dose reduced; gemcitabine /cisplatin  09/08/2023 Biliary obstruction secondary #1, status post placement of right and left hepatic duct stents 05/06/2023 Prostate cancer, status post prostatectomy in 2018,pT3a,pN0, Gleason 7 COPD Hypertension Hyperlipidemia Tobacco use 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  He is completing cycle 4 gemcitabine /cisplatin /Durvalumab .  He continues to tolerate treatment well.  He will return for day 8 gemcitabine /cisplatin  09/08/2023.  CBC and chemistry panel reviewed.  Labs adequate for treatment.  He has follow-up at [facility] 09/23/2023.  He will return for follow-up here in approximately 3 weeks.  We are available to see him sooner if needed.    [name] ANP/GNP-BC   09/07/2023  1:57 PM

## 2023-09-08 ENCOUNTER — Inpatient Hospital Stay

## 2023-09-08 VITALS — BP 149/61 | HR 68 | Temp 98.1°F | Resp 18

## 2023-09-08 DIAGNOSIS — I4892 Unspecified atrial flutter: Secondary | ICD-10-CM | POA: Diagnosis not present

## 2023-09-08 DIAGNOSIS — C61 Malignant neoplasm of prostate: Secondary | ICD-10-CM | POA: Diagnosis not present

## 2023-09-08 DIAGNOSIS — Z79899 Other long term (current) drug therapy: Secondary | ICD-10-CM | POA: Diagnosis not present

## 2023-09-08 DIAGNOSIS — J449 Chronic obstructive pulmonary disease, unspecified: Secondary | ICD-10-CM | POA: Diagnosis not present

## 2023-09-08 DIAGNOSIS — I10 Essential (primary) hypertension: Secondary | ICD-10-CM | POA: Diagnosis not present

## 2023-09-08 DIAGNOSIS — D696 Thrombocytopenia, unspecified: Secondary | ICD-10-CM | POA: Diagnosis not present

## 2023-09-08 DIAGNOSIS — Z5111 Encounter for antineoplastic chemotherapy: Secondary | ICD-10-CM | POA: Diagnosis not present

## 2023-09-08 DIAGNOSIS — Z7901 Long term (current) use of anticoagulants: Secondary | ICD-10-CM | POA: Diagnosis not present

## 2023-09-08 DIAGNOSIS — C221 Intrahepatic bile duct carcinoma: Secondary | ICD-10-CM | POA: Diagnosis not present

## 2023-09-08 DIAGNOSIS — E785 Hyperlipidemia, unspecified: Secondary | ICD-10-CM | POA: Diagnosis not present

## 2023-09-08 DIAGNOSIS — C240 Malignant neoplasm of extrahepatic bile duct: Secondary | ICD-10-CM

## 2023-09-08 MED ORDER — DEXAMETHASONE SODIUM PHOSPHATE 10 MG/ML IJ SOLN
10.0000 mg | Freq: Once | INTRAMUSCULAR | Status: AC
  Administered 2023-09-08: 10 mg via INTRAVENOUS
  Filled 2023-09-08: qty 1

## 2023-09-08 MED ORDER — MAGNESIUM SULFATE 2 GM/50ML IV SOLN
2.0000 g | Freq: Once | INTRAVENOUS | Status: AC
  Administered 2023-09-08: 2 g via INTRAVENOUS
  Filled 2023-09-08: qty 50

## 2023-09-08 MED ORDER — SODIUM CHLORIDE 0.9 % IV SOLN
150.0000 mg | Freq: Once | INTRAVENOUS | Status: AC
  Administered 2023-09-08: 150 mg via INTRAVENOUS
  Filled 2023-09-08: qty 150

## 2023-09-08 MED ORDER — SODIUM CHLORIDE 0.9 % IV SOLN
25.0000 mg/m2 | Freq: Once | INTRAVENOUS | Status: AC
  Administered 2023-09-08: 50 mg via INTRAVENOUS
  Filled 2023-09-08: qty 50

## 2023-09-08 MED ORDER — SODIUM CHLORIDE 0.9 % IV SOLN
800.0000 mg/m2 | Freq: Once | INTRAVENOUS | Status: AC
  Administered 2023-09-08: 1710 mg via INTRAVENOUS
  Filled 2023-09-08: qty 25.98

## 2023-09-08 MED ORDER — SODIUM CHLORIDE 0.9 % IV SOLN: INTRAVENOUS | Status: DC

## 2023-09-08 MED ORDER — POTASSIUM CHLORIDE IN NACL 20-0.9 MEQ/L-% IV SOLN
Freq: Once | INTRAVENOUS | Status: AC
  Filled 2023-09-08: qty 1000

## 2023-09-08 MED ORDER — PALONOSETRON HCL INJECTION 0.25 MG/5ML
0.2500 mg | Freq: Once | INTRAVENOUS | Status: AC
  Administered 2023-09-08: 0.25 mg via INTRAVENOUS
  Filled 2023-09-08: qty 5

## 2023-09-08 NOTE — Patient Instructions (Signed)
 [facility]  Discharge Instructions: Thank you for choosing [facility] to provide your oncology and hematology care.   If you have a lab appointment with the Cancer Center, please go directly to the Cancer Center and check in at the registration area.   Wear comfortable clothing and clothing appropriate for easy access to any Portacath or PICC line.   We strive to give you quality time with your provider. You may need to reschedule your appointment if you arrive late (15 or more minutes).  Arriving late affects you and other patients whose appointments are after yours.  Also, if you miss three or more appointments without notifying the office, you may be dismissed from the clinic at the provider's discretion.      For prescription refill requests, have your pharmacy contact our office and allow 72 hours for refills to be completed.    Today you received the following chemotherapy and/or immunotherapy agents gemzar, cisplatin      To help prevent nausea and vomiting after your treatment, we encourage you to take your nausea medication as directed.  BELOW ARE SYMPTOMS THAT SHOULD BE REPORTED IMMEDIATELY: *FEVER GREATER THAN 100.4 F (38 C) OR HIGHER *CHILLS OR SWEATING *NAUSEA AND VOMITING THAT IS NOT CONTROLLED WITH YOUR NAUSEA MEDICATION *UNUSUAL SHORTNESS OF BREATH *UNUSUAL BRUISING OR BLEEDING *URINARY PROBLEMS (pain or burning when urinating, or frequent urination) *BOWEL PROBLEMS (unusual diarrhea, constipation, pain near the anus) TENDERNESS IN MOUTH AND THROAT WITH OR WITHOUT PRESENCE OF ULCERS (sore throat, sores in mouth, or a toothache) UNUSUAL RASH, SWELLING OR PAIN  UNUSUAL VAGINAL DISCHARGE OR ITCHING   Items with * indicate a potential emergency and should be followed up as soon as possible or go to the Emergency Department if any problems should occur.  Please show the CHEMOTHERAPY ALERT CARD or  IMMUNOTHERAPY ALERT CARD at check-in to the Emergency Department and triage nurse.  Should you have questions after your visit or need to cancel or reschedule your appointment, please contact [facility]  Dept: [phone]  and follow the prompts.  Office hours are 8:00 a.m. to 4:30 p.m. Monday - Friday. Please note that voicemails left after 4:00 p.m. may not be returned until the following business day.  We are closed weekends and major holidays. You have access to a nurse at all times for urgent questions. Please call the main number to the clinic Dept: [phone] and follow the prompts.   For any non-urgent questions, you may also contact your provider using MyChart. We now offer e-Visits for anyone 40 and older to request care online for non-urgent symptoms. For details visit [email or web address].   Also download the MyChart app! Go to the app store, search "MyChart", open the app, select [place], and log in with your MyChart username and password.

## 2023-09-09 ENCOUNTER — Telehealth: Payer: Self-pay

## 2023-09-09 NOTE — Telephone Encounter (Signed)
 Called patient to change appointment time for his injection to 0830 at [facility]. Patient agreeable to time change.

## 2023-09-10 ENCOUNTER — Inpatient Hospital Stay

## 2023-09-10 VITALS — BP 169/61 | HR 66 | Temp 97.0°F | Resp 16

## 2023-09-10 DIAGNOSIS — Z7901 Long term (current) use of anticoagulants: Secondary | ICD-10-CM | POA: Diagnosis not present

## 2023-09-10 DIAGNOSIS — C221 Intrahepatic bile duct carcinoma: Secondary | ICD-10-CM | POA: Diagnosis not present

## 2023-09-10 DIAGNOSIS — J449 Chronic obstructive pulmonary disease, unspecified: Secondary | ICD-10-CM | POA: Diagnosis not present

## 2023-09-10 DIAGNOSIS — D696 Thrombocytopenia, unspecified: Secondary | ICD-10-CM | POA: Diagnosis not present

## 2023-09-10 DIAGNOSIS — Z5111 Encounter for antineoplastic chemotherapy: Secondary | ICD-10-CM | POA: Diagnosis not present

## 2023-09-10 DIAGNOSIS — I4892 Unspecified atrial flutter: Secondary | ICD-10-CM | POA: Diagnosis not present

## 2023-09-10 DIAGNOSIS — E785 Hyperlipidemia, unspecified: Secondary | ICD-10-CM | POA: Diagnosis not present

## 2023-09-10 DIAGNOSIS — Z79899 Other long term (current) drug therapy: Secondary | ICD-10-CM | POA: Diagnosis not present

## 2023-09-10 DIAGNOSIS — C240 Malignant neoplasm of extrahepatic bile duct: Secondary | ICD-10-CM

## 2023-09-10 DIAGNOSIS — C61 Malignant neoplasm of prostate: Secondary | ICD-10-CM | POA: Diagnosis not present

## 2023-09-10 DIAGNOSIS — I10 Essential (primary) hypertension: Secondary | ICD-10-CM | POA: Diagnosis not present

## 2023-09-10 MED ORDER — PEGFILGRASTIM-JMDB 6 MG/0.6ML ~~LOC~~ SOSY
6.0000 mg | PREFILLED_SYRINGE | Freq: Once | SUBCUTANEOUS | Status: AC
  Administered 2023-09-10: 6 mg via SUBCUTANEOUS
  Filled 2023-09-10: qty 0.6

## 2023-09-14 NOTE — Anesthesia Preprocedure Evaluation (Signed)
 Anesthesia Evaluation    Reviewed: Allergy & Precautions, H&P , Patient's Chart, lab work & pertinent test results  Airway        Dental   Pulmonary neg pulmonary ROS, COPD,  COPD inhaler, Current Smoker          Cardiovascular Exercise Tolerance: Good hypertension, Pt. on medications negative cardio ROS + dysrhythmias Atrial Fibrillation      Neuro/Psych negative neurological ROS  negative psych ROS   GI/Hepatic negative GI ROS, Neg liver ROS,,,  Endo/Other  negative endocrine ROS    Renal/GU negative Renal ROS  negative genitourinary   Musculoskeletal  (+) Arthritis , Osteoarthritis,    Abdominal   Peds  Hematology negative hematology ROS (+)   Anesthesia Other Findings   Reproductive/Obstetrics negative OB ROS                              Anesthesia Physical Anesthesia Plan  ASA: 3  Anesthesia Plan: General   Post-op Pain Management: Minimal or no pain anticipated   Induction: Intravenous  PONV Risk Score and Plan: 1 and Propofol  infusion  Airway Management Planned: Natural Airway and Nasal Cannula  Additional Equipment:   Intra-op Plan:   Post-operative Plan:   Informed Consent: I have reviewed the patients History and Physical, chart, labs and discussed the procedure including the risks, benefits and alternatives for the proposed anesthesia with the patient or authorized representative who has indicated his/her understanding and acceptance.       Plan Discussed with:   Anesthesia Plan Comments:         Anesthesia Quick Evaluation

## 2023-09-14 NOTE — Progress Notes (Signed)
 Spoke to patient and instructed them to come at 0800  and to be NPO after 0000.     Confirmed that patient will have a ride home and someone to stay with them for 24 hours after the procedure.   Medications reviewed.  Confirmed blood thinner.

## 2023-09-15 ENCOUNTER — Ambulatory Visit (HOSPITAL_COMMUNITY)
Admission: RE | Admit: 2023-09-15 | Discharge: 2023-09-15 | Disposition: A | Discharge status: Home / Self Care | Attending: Cardiology | Admitting: Cardiology

## 2023-09-15 ENCOUNTER — Other Ambulatory Visit: Payer: Self-pay

## 2023-09-15 ENCOUNTER — Encounter (HOSPITAL_COMMUNITY)
Admission: RE | Disposition: A | Discharge status: Home / Self Care | Payer: Self-pay | Source: Home / Self Care | Attending: Cardiology

## 2023-09-15 ENCOUNTER — Encounter (HOSPITAL_COMMUNITY): Payer: Self-pay | Admitting: Anesthesiology

## 2023-09-15 DIAGNOSIS — Z538 Procedure and treatment not carried out for other reasons: Secondary | ICD-10-CM | POA: Diagnosis not present

## 2023-09-15 DIAGNOSIS — I4819 Other persistent atrial fibrillation: Secondary | ICD-10-CM | POA: Diagnosis not present

## 2023-09-15 DIAGNOSIS — I4892 Unspecified atrial flutter: Secondary | ICD-10-CM

## 2023-09-15 SURGERY — CARDIOVERSION (CATH LAB)
Anesthesia: Monitor Anesthesia Care

## 2023-09-15 NOTE — Progress Notes (Signed)
 Patient presented for cardioversion but was in sinus rhythm. Discussed with patient. He will continue medications and keep scheduled follow up.  Shelda Bruckner, MD, PhD, [facility] [place]  [facility]  [place]  Heart & Vascular at [facility] at [facility] [address], [address] [place], [place] [address] [phone]

## 2023-09-18 DIAGNOSIS — J441 Chronic obstructive pulmonary disease with (acute) exacerbation: Secondary | ICD-10-CM | POA: Diagnosis not present

## 2023-09-18 DIAGNOSIS — J189 Pneumonia, unspecified organism: Secondary | ICD-10-CM | POA: Diagnosis not present

## 2023-09-18 DIAGNOSIS — A419 Sepsis, unspecified organism: Secondary | ICD-10-CM | POA: Diagnosis not present

## 2023-09-19 ENCOUNTER — Ambulatory Visit (INDEPENDENT_AMBULATORY_CARE_PROVIDER_SITE_OTHER)

## 2023-09-19 DIAGNOSIS — I483 Typical atrial flutter: Secondary | ICD-10-CM | POA: Diagnosis not present

## 2023-09-19 LAB — ECHOCARDIOGRAM COMPLETE
AR max vel: 1.72 cm2
AV Area VTI: 1.77 cm2
AV Area mean vel: 1.66 cm2
AV Mean grad: 13 mmHg
AV Peak grad: 23.2 mmHg
Ao pk vel: 2.41 m/s
Area-P 1/2: 4.49 cm2
S' Lateral: 3.18 cm

## 2023-09-20 ENCOUNTER — Ambulatory Visit: Admitting: Nurse Practitioner

## 2023-09-20 ENCOUNTER — Other Ambulatory Visit

## 2023-09-21 ENCOUNTER — Ambulatory Visit

## 2023-09-23 ENCOUNTER — Encounter

## 2023-09-23 DIAGNOSIS — C221 Intrahepatic bile duct carcinoma: Secondary | ICD-10-CM | POA: Diagnosis not present

## 2023-09-23 DIAGNOSIS — R590 Localized enlarged lymph nodes: Secondary | ICD-10-CM | POA: Diagnosis not present

## 2023-09-25 ENCOUNTER — Other Ambulatory Visit: Payer: Self-pay | Admitting: Oncology

## 2023-09-26 ENCOUNTER — Other Ambulatory Visit: Payer: Self-pay

## 2023-09-26 ENCOUNTER — Encounter: Payer: Self-pay | Admitting: Gastroenterology

## 2023-09-26 DIAGNOSIS — C221 Intrahepatic bile duct carcinoma: Secondary | ICD-10-CM

## 2023-09-26 DIAGNOSIS — T85528A Displacement of other gastrointestinal prosthetic devices, implants and grafts, initial encounter: Secondary | ICD-10-CM

## 2023-09-26 NOTE — Progress Notes (Signed)
 Patty, I think this will actually need to go to Dr. Lonni for Eliquis  hold in the setting of atrial fibrillation not oncology. GM

## 2023-09-26 NOTE — Progress Notes (Signed)
 ERCP scheduled, pt instructed and medications reviewed.  Patient instructions mailed to home.  Patient to call with any questions or concerns.  The pt is on Eliquis  now for elevated heart rate prescribed by oncology- letter sent to get approval to hold.

## 2023-09-26 NOTE — Progress Notes (Signed)
 Case discussed with Dr. Romero of [facility] hepatobiliary surgery. Patient to be discussed at [facility] in the coming weeks. Not clear if he will be a surgical candidate or not. Will plan on bringing him in for stent exchange. If he again tolerates this and does well, then the question in future will be transition to metal stents thereafter.  I will forward to my team to work on scheduling next available ERCP date.   Aloha Finner, MD [facility] Office # [phone]

## 2023-09-26 NOTE — Progress Notes (Signed)
 ERCP has been set up for 11/07/23 at 945 am at [facility] with GM

## 2023-09-28 ENCOUNTER — Inpatient Hospital Stay: Attending: Oncology | Admitting: Oncology

## 2023-09-28 ENCOUNTER — Encounter: Payer: Self-pay | Admitting: Oncology

## 2023-09-28 ENCOUNTER — Telehealth: Payer: Self-pay | Admitting: Oncology

## 2023-09-28 VITALS — BP 142/68 | HR 70 | Temp 97.8°F | Resp 18 | Ht 66.0 in | Wt 218.4 lb

## 2023-09-28 DIAGNOSIS — Z5111 Encounter for antineoplastic chemotherapy: Secondary | ICD-10-CM | POA: Diagnosis not present

## 2023-09-28 DIAGNOSIS — Z79899 Other long term (current) drug therapy: Secondary | ICD-10-CM | POA: Diagnosis not present

## 2023-09-28 DIAGNOSIS — C240 Malignant neoplasm of extrahepatic bile duct: Secondary | ICD-10-CM

## 2023-09-28 DIAGNOSIS — C221 Intrahepatic bile duct carcinoma: Secondary | ICD-10-CM | POA: Insufficient documentation

## 2023-09-28 DIAGNOSIS — D696 Thrombocytopenia, unspecified: Secondary | ICD-10-CM | POA: Insufficient documentation

## 2023-09-28 DIAGNOSIS — C259 Malignant neoplasm of pancreas, unspecified: Secondary | ICD-10-CM | POA: Insufficient documentation

## 2023-09-28 NOTE — Progress Notes (Signed)
 [facility] OFFICE PROGRESS NOTE   Diagnosis: Cholangiocarcinoma  INTERVAL HISTORY:   Robert [name] completed cycle 4 chemotherapy on 09/08/2023.  No nausea/vomiting, fever, or neuropathy symptoms.  He feels well.  He is working.  Good appetite. He underwent a restaging evaluation at [facility] on 09/23/2023.  There was no change in the hilar mass or periportal/left periotic lymph nodes.  Dr. Romero does not recommend surgery.  He recommends continuing the current systemic therapy.  Objective:  Vital signs in last 24 hours:  Blood pressure (!) 142/68, pulse 70, temperature 97.8 F (36.6 C), temperature source Temporal, resp. rate 18, height 5' 6 (1.676 m), weight 218 lb 6.4 oz (99.1 kg), SpO2 100%.    HEENT: No thrush or ulcers Lymphatics: No cervical, supraclavicular, axillary, or inguinal nodes Resp: Lungs with distant breath sounds, end inspiratory rhonchi at the left posterior base, no respiratory distress Cardio: Regular rate and rhythm GI: No hepatosplenomegaly, nontender, no mass Vascular: Phonic stasis change at the lower leg bilaterally, no edema  Portacath/PICC-without erythema  Lab Results:  Lab Results  Component Value Date   WBC 5.2 09/07/2023   HGB 10.6 (L) 09/07/2023   HCT 30.9 (L) 09/07/2023   MCV 97.5 09/07/2023   PLT 165 09/07/2023   NEUTROABS 2.1 09/07/2023    CMP  Lab Results  Component Value Date   NA 136 09/07/2023   K 4.0 09/07/2023   CL 97 (L) 09/07/2023   CO2 28 09/07/2023   GLUCOSE 121 (H) 09/07/2023   BUN 12 09/07/2023   CREATININE 0.60 (L) 09/07/2023   CALCIUM  9.5 09/07/2023   PROT 6.3 (L) 09/07/2023   ALBUMIN 3.8 09/07/2023   AST 28 09/07/2023   ALT 29 09/07/2023   ALKPHOS 78 09/07/2023   BILITOT 0.3 09/07/2023   GFRNONAA >60 09/07/2023   GFRAA 117 03/21/2020    Lab Results  Component Value Date   CEA1 4.8 (H) 05/03/2023   RJW800 351 (H) 06/08/2023    Lab Results  Component Value Date   INR 0.9 06/10/2023    LABPROT 12.4 06/10/2023    Imaging:  No results found.  Medications: I have reviewed the patient's current medications.   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  Duodenitis.  Plastic biliary and pancreatic stents noted at the major papilla.  Pancreatic stent removed. Cycle 1 gemcitabine /cisplatin /Durvalumab  06/20/2023, day 8 06/28/2023 (Gemcitabine  dose reduced due to thrombocytopenia), Fulphila  Cycle 2 gemcitabine /cisplatin /Durvalumab  07/14/2023, day 8 07/21/2023, Fulphila  Cycle 3 gemcitabine /cisplatin /durvalumab  08/09/2023 (Gem dose reduced C3D8 for thrombocytopenia) Cycle 4 Day 1 gem/cisplatin /durvalumab  08/31/23, gem dose reduced; gemcitabine /cisplatin  09/08/2023 09/23/2023 CT abdomen: No change in the ill-defined soft tissue surrounding the common bile duct, stable periportal and retroperitoneal lymph nodes, biliary stents in place with decreased intrahepatic duct dilation  Cycle 5-day 1 gemcitabine /cisplatin /durvalumab  10/05/2023 Biliary  obstruction secondary #1, status post placement of right and left hepatic duct stents 05/06/2023 Prostate cancer, status post prostatectomy in 2018,pT3a,pN0, Gleason 7 COPD Hypertension Hyperlipidemia Tobacco use 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  He has completed 4 cycles of gemcitabine /cisplatin /durvalumab .  He has tolerated the treatment well.  The restaging CT last week revealed stable disease.  He saw Dr. Romero.  Dr. Dustin mom does not recommend surgery.  I recommend continuing systemic therapy.  He will begin another cycle of chemotherapy on 10/05/2023.  We will check a CA 19-9 and CEA when he is here on 10/05/2023.  Robert [name] will be scheduled for an office visit prior to day 8 chemotherapy.    Robert Hof, MD  09/28/2023  11:04 AM

## 2023-09-28 NOTE — Progress Notes (Signed)
 The patient has been notified of this information and all questions answered.  The pt will hold Eliquis  2 days prior to the procedure

## 2023-09-28 NOTE — Progress Notes (Signed)
 2-days is great. Thanks. GM  FYI Patty and Rovonda. GM

## 2023-09-28 NOTE — Telephone Encounter (Signed)
 Patient has been scheduled for follow-up visit per 09/28/23 LOS.  Pt noted appt details on personal electronic device.

## 2023-10-04 ENCOUNTER — Inpatient Hospital Stay

## 2023-10-04 ENCOUNTER — Inpatient Hospital Stay (HOSPITAL_BASED_OUTPATIENT_CLINIC_OR_DEPARTMENT_OTHER): Admitting: Nurse Practitioner

## 2023-10-04 ENCOUNTER — Encounter: Payer: Self-pay | Admitting: Nurse Practitioner

## 2023-10-04 VITALS — BP 124/68 | HR 65 | Temp 97.2°F | Resp 18 | Wt 214.6 lb

## 2023-10-04 DIAGNOSIS — Z1501 Genetic susceptibility to malignant neoplasm of breast: Secondary | ICD-10-CM | POA: Diagnosis not present

## 2023-10-04 DIAGNOSIS — C259 Malignant neoplasm of pancreas, unspecified: Secondary | ICD-10-CM | POA: Diagnosis not present

## 2023-10-04 DIAGNOSIS — C240 Malignant neoplasm of extrahepatic bile duct: Secondary | ICD-10-CM

## 2023-10-04 DIAGNOSIS — Z5111 Encounter for antineoplastic chemotherapy: Secondary | ICD-10-CM | POA: Diagnosis not present

## 2023-10-04 DIAGNOSIS — Z79899 Other long term (current) drug therapy: Secondary | ICD-10-CM | POA: Diagnosis not present

## 2023-10-04 DIAGNOSIS — C221 Intrahepatic bile duct carcinoma: Secondary | ICD-10-CM | POA: Diagnosis not present

## 2023-10-04 DIAGNOSIS — D696 Thrombocytopenia, unspecified: Secondary | ICD-10-CM | POA: Diagnosis not present

## 2023-10-04 DIAGNOSIS — Z1509 Genetic susceptibility to other malignant neoplasm: Secondary | ICD-10-CM | POA: Diagnosis not present

## 2023-10-04 LAB — CMP (CANCER CENTER ONLY)
ALT: 12 U/L (ref 0–44)
AST: 20 U/L (ref 15–41)
Albumin: 4 g/dL (ref 3.5–5.0)
Alkaline Phosphatase: 86 U/L (ref 38–126)
Anion gap: 11 (ref 5–15)
BUN: 12 mg/dL (ref 8–23)
CO2: 26 mmol/L (ref 22–32)
Calcium: 9.5 mg/dL (ref 8.9–10.3)
Chloride: 100 mmol/L (ref 98–111)
Creatinine: 0.67 mg/dL (ref 0.61–1.24)
GFR, Estimated: >60 mL/min (ref >60)
Glucose, Bld: 128 mg/dL — ABNORMAL HIGH (ref 70–99)
Potassium: 4.1 mmol/L (ref 3.5–5.1)
Sodium: 137 mmol/L (ref 135–145)
Total Bilirubin: 0.4 mg/dL (ref 0.0–1.2)
Total Protein: 6.5 g/dL (ref 6.5–8.1)

## 2023-10-04 LAB — CBC WITH DIFFERENTIAL (CANCER CENTER ONLY)
Abs Immature Granulocytes: 0.03 K/uL (ref 0.00–0.07)
Basophils Absolute: 0.1 K/uL (ref 0.0–0.1)
Basophils Relative: 1 %
Eosinophils Absolute: 0.2 K/uL (ref 0.0–0.5)
Eosinophils Relative: 2 %
HCT: 32.5 % — ABNORMAL LOW (ref 39.0–52.0)
Hemoglobin: 10.6 g/dL — ABNORMAL LOW (ref 13.0–17.0)
Immature Granulocytes: 0 %
Lymphocytes Relative: 21 %
Lymphs Abs: 1.5 K/uL (ref 0.7–4.0)
MCH: 34.8 pg — ABNORMAL HIGH (ref 26.0–34.0)
MCHC: 32.6 g/dL (ref 30.0–36.0)
MCV: 106.6 fL — ABNORMAL HIGH (ref 80.0–100.0)
Monocytes Absolute: 1.2 K/uL — ABNORMAL HIGH (ref 0.1–1.0)
Monocytes Relative: 17 %
Neutro Abs: 4.3 K/uL (ref 1.7–7.7)
Neutrophils Relative %: 59 %
Platelet Count: 177 K/uL (ref 150–400)
RBC: 3.05 MIL/uL — ABNORMAL LOW (ref 4.22–5.81)
RDW: 17.8 % — ABNORMAL HIGH (ref 11.5–15.5)
WBC Count: 7.3 K/uL (ref 4.0–10.5)
nRBC: 0 % (ref 0.0–0.2)

## 2023-10-04 LAB — CEA (ACCESS): CEA (CHCC): 3.86 ng/mL (ref 0.00–5.00)

## 2023-10-04 LAB — MAGNESIUM: Magnesium: 2 mg/dL (ref 1.7–2.4)

## 2023-10-04 NOTE — Progress Notes (Signed)
 Patient seen by [name] NP today  Vitals are within treatment parameters:Yes   Labs are within treatment parameters: Yes   Treatment plan has been signed: Yes   Per physician team, Patient is ready for treatment and there are NO modifications to the treatment plan. Chemo is on 10/05/2023

## 2023-10-04 NOTE — Patient Instructions (Signed)

## 2023-10-04 NOTE — Progress Notes (Signed)
  [facility] OFFICE PROGRESS NOTE   Diagnosis: Cholangiocarcinoma  INTERVAL HISTORY:   Robert [name] returns as scheduled.  He has completed 4 cycles of gemcitabine /cisplatin /Durvalumab .  He denies nausea/vomiting.  No mouth sores.  No diarrhea.  No rash.  He denies pain.  He has a good appetite.  No numbness or tingling in the hands or feet.  No hearing loss.  No tinnitus.  Objective:  Vital signs in last 24 hours:  Blood pressure 124/68, pulse 65, temperature (!) 97.2 F (36.2 C), temperature source Temporal, resp. rate 18, weight 214 lb 9.6 oz (97.3 kg), SpO2 97%.    HEENT: No thrush or ulcers. Resp: Scattered bilateral wheezes.  No respiratory distress. Cardio: Regular rate and rhythm. GI: No hepatosplenomegaly.  Nontender.  No mass. Vascular: No leg edema.  Chronic stasis change lower leg bilaterally. Neuro: Alert and oriented. Skin: No rash. Port-A-Cath without erythema.  Lab Results:  Lab Results  Component Value Date   WBC 7.3 10/04/2023   HGB 10.6 (L) 10/04/2023   HCT 32.5 (L) 10/04/2023   MCV 106.6 (H) 10/04/2023   PLT 177 10/04/2023   NEUTROABS 4.3 10/04/2023    Imaging:  No results found.  Medications: I have reviewed the patient's current medications.  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  Duodenitis.  Plastic biliary and pancreatic stents noted at the major papilla.  Pancreatic stent removed. Cycle 1 gemcitabine /cisplatin /Durvalumab  06/20/2023, day 8 06/28/2023 (Gemcitabine  dose reduced due to thrombocytopenia), Fulphila  Cycle 2 gemcitabine /cisplatin /Durvalumab  07/14/2023, day 8 07/21/2023, Fulphila  Cycle 3 gemcitabine /cisplatin /durvalumab  08/09/2023 (Gem dose reduced C3D8 for thrombocytopenia) Cycle 4 Day 1 gem/cisplatin /durvalumab  08/31/23, gem dose reduced; gemcitabine /cisplatin  09/08/2023 09/23/2023 CT abdomen: No change in the ill-defined soft tissue surrounding the common bile duct, stable periportal and retroperitoneal lymph nodes, biliary stents in place with decreased intrahepatic duct dilation Cycle 5-day 1 gemcitabine /cisplatin /durvalumab  10/05/2023 Biliary obstruction secondary #1, status post placement of right and left hepatic duct stents 05/06/2023 Prostate cancer, status post prostatectomy in 2018,pT3a,pN0, Gleason 7 COPD Hypertension Hyperlipidemia Tobacco use 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  He has completed 4 cycles of gemcitabine /cisplatin /Durvalumab .  He is tolerating treatment well.  Plan to proceed with cycle 5-day 1 tomorrow as scheduled.  CBC and chemistry panel reviewed.  Labs adequate for treatment.  He will return for follow-up in 1 week.  We are available to see him sooner if needed.    [name] ANP/GNP-BC   10/04/2023  2:02 PM

## 2023-10-05 ENCOUNTER — Encounter: Payer: Self-pay | Admitting: Oncology

## 2023-10-05 ENCOUNTER — Inpatient Hospital Stay

## 2023-10-05 VITALS — BP 151/81 | HR 65 | Temp 97.8°F | Resp 18

## 2023-10-05 DIAGNOSIS — D696 Thrombocytopenia, unspecified: Secondary | ICD-10-CM | POA: Diagnosis not present

## 2023-10-05 DIAGNOSIS — C221 Intrahepatic bile duct carcinoma: Secondary | ICD-10-CM | POA: Diagnosis not present

## 2023-10-05 DIAGNOSIS — Z5111 Encounter for antineoplastic chemotherapy: Secondary | ICD-10-CM | POA: Diagnosis not present

## 2023-10-05 DIAGNOSIS — C240 Malignant neoplasm of extrahepatic bile duct: Secondary | ICD-10-CM

## 2023-10-05 DIAGNOSIS — Z79899 Other long term (current) drug therapy: Secondary | ICD-10-CM | POA: Diagnosis not present

## 2023-10-05 DIAGNOSIS — C259 Malignant neoplasm of pancreas, unspecified: Secondary | ICD-10-CM | POA: Diagnosis not present

## 2023-10-05 LAB — CANCER ANTIGEN 19-9: CA 19-9: 68 U/mL — ABNORMAL HIGH (ref 0–35)

## 2023-10-05 MED ORDER — POTASSIUM CHLORIDE IN NACL 20-0.9 MEQ/L-% IV SOLN
Freq: Once | INTRAVENOUS | Status: AC
  Filled 2023-10-05: qty 1000

## 2023-10-05 MED ORDER — SODIUM CHLORIDE 0.9 % IV SOLN: INTRAVENOUS | Status: DC

## 2023-10-05 MED ORDER — SODIUM CHLORIDE 0.9 % IV SOLN
800.0000 mg/m2 | Freq: Once | INTRAVENOUS | Status: AC
  Administered 2023-10-05: 1710 mg via INTRAVENOUS
  Filled 2023-10-05: qty 24.98

## 2023-10-05 MED ORDER — SODIUM CHLORIDE 0.9 % IV SOLN
1500.0000 mg | Freq: Once | INTRAVENOUS | Status: AC
  Administered 2023-10-05: 1500 mg via INTRAVENOUS
  Filled 2023-10-05: qty 30

## 2023-10-05 MED ORDER — DEXAMETHASONE SODIUM PHOSPHATE 10 MG/ML IJ SOLN
10.0000 mg | Freq: Once | INTRAMUSCULAR | Status: AC
  Administered 2023-10-05: 10 mg via INTRAVENOUS
  Filled 2023-10-05: qty 1

## 2023-10-05 MED ORDER — MAGNESIUM SULFATE 2 GM/50ML IV SOLN
2.0000 g | Freq: Once | INTRAVENOUS | Status: AC
  Administered 2023-10-05: 2 g via INTRAVENOUS
  Filled 2023-10-05: qty 50

## 2023-10-05 MED ORDER — SODIUM CHLORIDE 0.9 % IV SOLN
150.0000 mg | Freq: Once | INTRAVENOUS | Status: AC
  Administered 2023-10-05: 150 mg via INTRAVENOUS
  Filled 2023-10-05: qty 150

## 2023-10-05 MED ORDER — SODIUM CHLORIDE 0.9 % IV SOLN
25.0000 mg/m2 | Freq: Once | INTRAVENOUS | Status: AC
  Administered 2023-10-05: 50 mg via INTRAVENOUS
  Filled 2023-10-05: qty 50

## 2023-10-05 MED ORDER — PALONOSETRON HCL INJECTION 0.25 MG/5ML
0.2500 mg | Freq: Once | INTRAVENOUS | Status: AC
  Administered 2023-10-05: 0.25 mg via INTRAVENOUS
  Filled 2023-10-05: qty 5

## 2023-10-05 NOTE — Patient Instructions (Signed)
 [facility] - A DEPT OF [facility]  Discharge Instructions: Thank you for choosing [facility] to provide your oncology and hematology care.   If you have a lab appointment with the Cancer Center, please go directly to the Cancer Center and check in at the registration area.   Wear comfortable clothing and clothing appropriate for easy access to any Portacath or PICC line.   We strive to give you quality time with your provider. You may need to reschedule your appointment if you arrive late (15 or more minutes).  Arriving late affects you and other patients whose appointments are after yours.  Also, if you miss three or more appointments without notifying the office, you may be dismissed from the clinic at the provider's discretion.      For prescription refill requests, have your pharmacy contact our office and allow 72 hours for refills to be completed.    Today you received the following chemotherapy and/or immunotherapy agents: imfinzi , gemzar , cisplatin       To help prevent nausea and vomiting after your treatment, we encourage you to take your nausea medication as directed.  BELOW ARE SYMPTOMS THAT SHOULD BE REPORTED IMMEDIATELY: *FEVER GREATER THAN 100.4 F (38 C) OR HIGHER *CHILLS OR SWEATING *NAUSEA AND VOMITING THAT IS NOT CONTROLLED WITH YOUR NAUSEA MEDICATION *UNUSUAL SHORTNESS OF BREATH *UNUSUAL BRUISING OR BLEEDING *URINARY PROBLEMS (pain or burning when urinating, or frequent urination) *BOWEL PROBLEMS (unusual diarrhea, constipation, pain near the anus) TENDERNESS IN MOUTH AND THROAT WITH OR WITHOUT PRESENCE OF ULCERS (sore throat, sores in mouth, or a toothache) UNUSUAL RASH, SWELLING OR PAIN  UNUSUAL VAGINAL DISCHARGE OR ITCHING   Items with * indicate a potential emergency and should be followed up as soon as possible or go to the Emergency Department if any problems should occur.  Please show the CHEMOTHERAPY ALERT CARD  or IMMUNOTHERAPY ALERT CARD at check-in to the Emergency Department and triage nurse.  Should you have questions after your visit or need to cancel or reschedule your appointment, please contact [facility] - [facility] DEPT [facility]  Dept: [phone]  and follow the prompts.  Office hours are 8:00 a.m. to 4:30 p.m. Monday - Friday. Please note that voicemails left after 4:00 p.m. may not be returned until the following business day.  We are closed weekends and major holidays. You have access to a nurse at all times for urgent questions. Please call the main number to the clinic Dept: [phone] and follow the prompts.   For any non-urgent questions, you may also contact your provider using MyChart. We now offer e-Visits for anyone 35 and older to request care online for non-urgent symptoms. For details visit [email or web address].   Also download the MyChart app! Go to the app store, search MyChart, open the app, select [place], and log in with your MyChart username and password.

## 2023-10-10 ENCOUNTER — Telehealth: Payer: Self-pay

## 2023-10-10 NOTE — Telephone Encounter (Signed)
 Message has been resent to [employer] for Eliquis  hold

## 2023-10-10 NOTE — Telephone Encounter (Signed)
-----   Message from Nurse Gwenith Tschida P sent at 09/26/2023  2:29 PM EDT ----- Make sure to have eliquis  hold from Robert [name]

## 2023-10-11 ENCOUNTER — Inpatient Hospital Stay

## 2023-10-11 ENCOUNTER — Encounter: Payer: Self-pay | Admitting: Nurse Practitioner

## 2023-10-11 ENCOUNTER — Inpatient Hospital Stay: Admitting: Nurse Practitioner

## 2023-10-11 VITALS — BP 127/80 | HR 87 | Temp 97.8°F | Resp 18 | Ht 66.0 in | Wt 217.3 lb

## 2023-10-11 DIAGNOSIS — Z79899 Other long term (current) drug therapy: Secondary | ICD-10-CM | POA: Diagnosis not present

## 2023-10-11 DIAGNOSIS — C240 Malignant neoplasm of extrahepatic bile duct: Secondary | ICD-10-CM

## 2023-10-11 DIAGNOSIS — D696 Thrombocytopenia, unspecified: Secondary | ICD-10-CM | POA: Diagnosis not present

## 2023-10-11 DIAGNOSIS — C259 Malignant neoplasm of pancreas, unspecified: Secondary | ICD-10-CM | POA: Diagnosis not present

## 2023-10-11 DIAGNOSIS — Z5111 Encounter for antineoplastic chemotherapy: Secondary | ICD-10-CM | POA: Diagnosis not present

## 2023-10-11 DIAGNOSIS — C221 Intrahepatic bile duct carcinoma: Secondary | ICD-10-CM | POA: Diagnosis not present

## 2023-10-11 LAB — CBC WITH DIFFERENTIAL (CANCER CENTER ONLY)
Abs Immature Granulocytes: 0.03 K/uL (ref 0.00–0.07)
Basophils Absolute: 0.1 K/uL (ref 0.0–0.1)
Basophils Relative: 1 %
Eosinophils Absolute: 0.1 K/uL (ref 0.0–0.5)
Eosinophils Relative: 2 %
HCT: 31.2 % — ABNORMAL LOW (ref 39.0–52.0)
Hemoglobin: 10.3 g/dL — ABNORMAL LOW (ref 13.0–17.0)
Immature Granulocytes: 1 %
Lymphocytes Relative: 37 %
Lymphs Abs: 2.4 K/uL (ref 0.7–4.0)
MCH: 35.2 pg — ABNORMAL HIGH (ref 26.0–34.0)
MCHC: 33 g/dL (ref 30.0–36.0)
MCV: 106.5 fL — ABNORMAL HIGH (ref 80.0–100.0)
Monocytes Absolute: 0.1 K/uL (ref 0.1–1.0)
Monocytes Relative: 2 %
Neutro Abs: 3.8 K/uL (ref 1.7–7.7)
Neutrophils Relative %: 57 %
Platelet Count: 127 K/uL — ABNORMAL LOW (ref 150–400)
RBC: 2.93 MIL/uL — ABNORMAL LOW (ref 4.22–5.81)
RDW: 15.7 % — ABNORMAL HIGH (ref 11.5–15.5)
WBC Count: 6.5 K/uL (ref 4.0–10.5)
nRBC: 0 % (ref 0.0–0.2)

## 2023-10-11 LAB — CMP (CANCER CENTER ONLY)
ALT: 37 U/L (ref 0–44)
AST: 25 U/L (ref 15–41)
Albumin: 3.7 g/dL (ref 3.5–5.0)
Alkaline Phosphatase: 74 U/L (ref 38–126)
Anion gap: 10 (ref 5–15)
BUN: 14 mg/dL (ref 8–23)
CO2: 29 mmol/L (ref 22–32)
Calcium: 10 mg/dL (ref 8.9–10.3)
Chloride: 100 mmol/L (ref 98–111)
Creatinine: 0.55 mg/dL — ABNORMAL LOW (ref 0.61–1.24)
GFR, Estimated: >60 mL/min (ref >60)
Glucose, Bld: 128 mg/dL — ABNORMAL HIGH (ref 70–99)
Potassium: 4 mmol/L (ref 3.5–5.1)
Sodium: 139 mmol/L (ref 135–145)
Total Bilirubin: 0.3 mg/dL (ref 0.0–1.2)
Total Protein: 6.2 g/dL — ABNORMAL LOW (ref 6.5–8.1)

## 2023-10-11 LAB — MAGNESIUM: Magnesium: 1.6 mg/dL — ABNORMAL LOW (ref 1.7–2.4)

## 2023-10-11 NOTE — Patient Instructions (Signed)

## 2023-10-11 NOTE — Progress Notes (Signed)
  [name] OFFICE PROGRESS NOTE   Diagnosis: Cholangiocarcinoma  INTERVAL HISTORY:   Robert [name] returns as scheduled.  He completed cycle 5-day 1 gemcitabine /cisplatin /Durvalumab  10/05/2023.  He reports tolerating treatment well.  No nausea or vomiting.  No mouth sores.  No diarrhea.  No rash.  No hearing loss.  No tinnitus.  No numbness or tingling in the hands or feet.  He denies abdominal pain.  No bleeding.  Objective:  Vital signs in last 24 hours:  Blood pressure 127/80, pulse 87, temperature 97.8 F (36.6 C), resp. rate 18, height 5' 6 (1.676 m), weight 217 lb 4.8 oz (98.6 kg), SpO2 100%.    HEENT: No thrush or ulcers. Resp: A few scattered expiratory wheezes.  No respiratory distress. Cardio: Regular rate and rhythm. GI: No hepatosplenomegaly.  Nontender. Vascular: No leg edema. Skin: No rash. Port-A-Cath without erythema.  Lab Results:  Lab Results  Component Value Date   WBC 6.5 10/11/2023   HGB 10.3 (L) 10/11/2023   HCT 31.2 (L) 10/11/2023   MCV 106.5 (H) 10/11/2023   PLT PENDING 10/11/2023   NEUTROABS 3.8 10/11/2023    Imaging:  No results found.  Medications: I have reviewed the patient's current medications.  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 with dilation of the left and right biliary systems, left and right hepatic stents and temporary pancreatic stent placed, stricture brushing-adenocarcinoma; HER2 IHC result equivocal (2+), FISH results indeterminant; Tempus-microsatellite stable, tumor mutation burden less than 10, ERBB2  expression, BRCA2 mutated CT chest 05/06/2023: No evidence of metastatic disease Elevated CA 19-9 on 05/03/2023 CT abdomen 05/27/2023 (due), decreased Intermatic biliary duct dilation, status post stenting of common bile duct, persistent soft tissue at the hepatic hilum, pancreatic duct stent in place Upper endoscopy 06/16/2023-gastritis.  Duodenitis.  Plastic biliary and pancreatic stents noted at the major papilla.  Pancreatic stent removed. Cycle 1 gemcitabine /cisplatin /Durvalumab  06/20/2023, day 8 06/28/2023 (Gemcitabine  dose reduced due to thrombocytopenia), Fulphila  Cycle 2 gemcitabine /cisplatin /Durvalumab  07/14/2023, day 8 07/21/2023, Fulphila  Cycle 3 gemcitabine /cisplatin /durvalumab  08/09/2023 (Gem dose reduced C3D8 for thrombocytopenia) Cycle 4 Day 1 gem/cisplatin /durvalumab  08/31/23, gem dose reduced; gemcitabine /cisplatin  09/08/2023 09/23/2023 CT abdomen: No change in the ill-defined soft tissue surrounding the common bile duct, stable periportal and retroperitoneal lymph nodes, biliary stents in place with decreased intrahepatic duct dilation Cycle 5-day 1 gemcitabine /cisplatin /durvalumab  10/05/2023, day 8 10/12/2023 Biliary obstruction secondary #1, status post placement of right and left hepatic duct stents 05/06/2023 Prostate cancer, status post prostatectomy in 2018,pT3a,pN0, Gleason 7 COPD Hypertension Hyperlipidemia Tobacco use 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  He is completing cycle 5 gemcitabine /cisplatin /Durvalumab .  He continues to tolerate treatment well.  There is no clinical evidence of disease progression.  Plan to proceed with day 8 gemcitabine /cisplatin  as scheduled 10/12/2023.  CBC and chemistry panel reviewed.  Labs adequate for treatment.  He will return for follow-up and the next cycle of treatment 10/25/2023.  We  are available to see him sooner if needed.    Robert [name] ANP/GNP-BC   10/11/2023  1:49 PM

## 2023-10-12 ENCOUNTER — Inpatient Hospital Stay: Attending: Oncology

## 2023-10-12 VITALS — BP 143/75 | HR 63 | Temp 98.2°F | Resp 18

## 2023-10-12 DIAGNOSIS — J449 Chronic obstructive pulmonary disease, unspecified: Secondary | ICD-10-CM | POA: Diagnosis not present

## 2023-10-12 DIAGNOSIS — Z5111 Encounter for antineoplastic chemotherapy: Secondary | ICD-10-CM | POA: Insufficient documentation

## 2023-10-12 DIAGNOSIS — Z8546 Personal history of malignant neoplasm of prostate: Secondary | ICD-10-CM | POA: Diagnosis not present

## 2023-10-12 DIAGNOSIS — I4892 Unspecified atrial flutter: Secondary | ICD-10-CM | POA: Insufficient documentation

## 2023-10-12 DIAGNOSIS — I10 Essential (primary) hypertension: Secondary | ICD-10-CM | POA: Insufficient documentation

## 2023-10-12 DIAGNOSIS — T451X5A Adverse effect of antineoplastic and immunosuppressive drugs, initial encounter: Secondary | ICD-10-CM | POA: Diagnosis not present

## 2023-10-12 DIAGNOSIS — D6959 Other secondary thrombocytopenia: Secondary | ICD-10-CM | POA: Insufficient documentation

## 2023-10-12 DIAGNOSIS — Z7901 Long term (current) use of anticoagulants: Secondary | ICD-10-CM | POA: Insufficient documentation

## 2023-10-12 DIAGNOSIS — Z79899 Other long term (current) drug therapy: Secondary | ICD-10-CM | POA: Insufficient documentation

## 2023-10-12 DIAGNOSIS — C221 Intrahepatic bile duct carcinoma: Secondary | ICD-10-CM | POA: Diagnosis not present

## 2023-10-12 DIAGNOSIS — E785 Hyperlipidemia, unspecified: Secondary | ICD-10-CM | POA: Diagnosis not present

## 2023-10-12 DIAGNOSIS — Z720 Tobacco use: Secondary | ICD-10-CM | POA: Diagnosis not present

## 2023-10-12 DIAGNOSIS — C240 Malignant neoplasm of extrahepatic bile duct: Secondary | ICD-10-CM

## 2023-10-12 MED ORDER — DEXAMETHASONE SODIUM PHOSPHATE 10 MG/ML IJ SOLN
10.0000 mg | Freq: Once | INTRAMUSCULAR | Status: AC
  Administered 2023-10-12: 10 mg via INTRAVENOUS
  Filled 2023-10-12: qty 1

## 2023-10-12 MED ORDER — MAGNESIUM SULFATE 2 GM/50ML IV SOLN
2.0000 g | Freq: Once | INTRAVENOUS | Status: AC
  Administered 2023-10-12: 2 g via INTRAVENOUS
  Filled 2023-10-12: qty 50

## 2023-10-12 MED ORDER — SODIUM CHLORIDE 0.9 % IV SOLN
800.0000 mg/m2 | Freq: Once | INTRAVENOUS | Status: AC
  Administered 2023-10-12: 1710 mg via INTRAVENOUS
  Filled 2023-10-12: qty 25.98

## 2023-10-12 MED ORDER — SODIUM CHLORIDE 0.9 % IV SOLN: INTRAVENOUS | Status: DC

## 2023-10-12 MED ORDER — PALONOSETRON HCL INJECTION 0.25 MG/5ML
0.2500 mg | Freq: Once | INTRAVENOUS | Status: AC
  Administered 2023-10-12: 0.25 mg via INTRAVENOUS
  Filled 2023-10-12: qty 5

## 2023-10-12 MED ORDER — SODIUM CHLORIDE 0.9 % IV SOLN
150.0000 mg | Freq: Once | INTRAVENOUS | Status: AC
  Administered 2023-10-12: 150 mg via INTRAVENOUS
  Filled 2023-10-12: qty 150

## 2023-10-12 MED ORDER — SODIUM CHLORIDE 0.9 % IV SOLN
25.0000 mg/m2 | Freq: Once | INTRAVENOUS | Status: AC
  Administered 2023-10-12: 50 mg via INTRAVENOUS
  Filled 2023-10-12: qty 50

## 2023-10-12 MED ORDER — POTASSIUM CHLORIDE IN NACL 20-0.9 MEQ/L-% IV SOLN
Freq: Once | INTRAVENOUS | Status: AC
  Filled 2023-10-12: qty 1000

## 2023-10-12 NOTE — Patient Instructions (Signed)
 [facility] - A DEPT OF [facility]  Discharge Instructions: Thank you for choosing [facility] to provide your oncology and hematology care.   If you have a lab appointment with the Cancer Center, please go directly to the Cancer Center and check in at the registration area.   Wear comfortable clothing and clothing appropriate for easy access to any Portacath or PICC line.   We strive to give you quality time with your provider. You may need to reschedule your appointment if you arrive late (15 or more minutes).  Arriving late affects you and other patients whose appointments are after yours.  Also, if you miss three or more appointments without notifying the office, you may be dismissed from the clinic at the provider's discretion.      For prescription refill requests, have your pharmacy contact our office and allow 72 hours for refills to be completed.    Today you received the following chemotherapy and/or immunotherapy agents: gemzar  and cisplatin       To help prevent nausea and vomiting after your treatment, we encourage you to take your nausea medication as directed.  BELOW ARE SYMPTOMS THAT SHOULD BE REPORTED IMMEDIATELY: *FEVER GREATER THAN 100.4 F (38 C) OR HIGHER *CHILLS OR SWEATING *NAUSEA AND VOMITING THAT IS NOT CONTROLLED WITH YOUR NAUSEA MEDICATION *UNUSUAL SHORTNESS OF BREATH *UNUSUAL BRUISING OR BLEEDING *URINARY PROBLEMS (pain or burning when urinating, or frequent urination) *BOWEL PROBLEMS (unusual diarrhea, constipation, pain near the anus) TENDERNESS IN MOUTH AND THROAT WITH OR WITHOUT PRESENCE OF ULCERS (sore throat, sores in mouth, or a toothache) UNUSUAL RASH, SWELLING OR PAIN  UNUSUAL VAGINAL DISCHARGE OR ITCHING   Items with * indicate a potential emergency and should be followed up as soon as possible or go to the Emergency Department if any problems should occur.  Please show the CHEMOTHERAPY ALERT CARD or  IMMUNOTHERAPY ALERT CARD at check-in to the Emergency Department and triage nurse.  Should you have questions after your visit or need to cancel or reschedule your appointment, please contact [facility] - A DEPT OF [facility]  Dept: [phone]  and follow the prompts.  Office hours are 8:00 a.m. to 4:30 p.m. Monday - Friday. Please note that voicemails left after 4:00 p.m. may not be returned until the following business day.  We are closed weekends and major holidays. You have access to a nurse at all times for urgent questions. Please call the main number to the clinic Dept: [phone] and follow the prompts.   For any non-urgent questions, you may also contact your provider using MyChart. We now offer e-Visits for anyone 54 and older to request care online for non-urgent symptoms. For details visit [email or web address].   Also download the MyChart app! Go to the app store, search MyChart, open the app, select [place], and log in with your MyChart username and password.

## 2023-10-14 ENCOUNTER — Inpatient Hospital Stay

## 2023-10-14 VITALS — BP 116/76 | HR 76 | Temp 98.2°F | Resp 18

## 2023-10-14 DIAGNOSIS — C221 Intrahepatic bile duct carcinoma: Secondary | ICD-10-CM | POA: Diagnosis not present

## 2023-10-14 DIAGNOSIS — Z79899 Other long term (current) drug therapy: Secondary | ICD-10-CM | POA: Diagnosis not present

## 2023-10-14 DIAGNOSIS — D6959 Other secondary thrombocytopenia: Secondary | ICD-10-CM | POA: Diagnosis not present

## 2023-10-14 DIAGNOSIS — T451X5A Adverse effect of antineoplastic and immunosuppressive drugs, initial encounter: Secondary | ICD-10-CM | POA: Diagnosis not present

## 2023-10-14 DIAGNOSIS — C240 Malignant neoplasm of extrahepatic bile duct: Secondary | ICD-10-CM

## 2023-10-14 DIAGNOSIS — I4892 Unspecified atrial flutter: Secondary | ICD-10-CM | POA: Diagnosis not present

## 2023-10-14 DIAGNOSIS — Z7901 Long term (current) use of anticoagulants: Secondary | ICD-10-CM | POA: Diagnosis not present

## 2023-10-14 DIAGNOSIS — Z8546 Personal history of malignant neoplasm of prostate: Secondary | ICD-10-CM | POA: Diagnosis not present

## 2023-10-14 DIAGNOSIS — I10 Essential (primary) hypertension: Secondary | ICD-10-CM | POA: Diagnosis not present

## 2023-10-14 DIAGNOSIS — J449 Chronic obstructive pulmonary disease, unspecified: Secondary | ICD-10-CM | POA: Diagnosis not present

## 2023-10-14 DIAGNOSIS — Z720 Tobacco use: Secondary | ICD-10-CM | POA: Diagnosis not present

## 2023-10-14 DIAGNOSIS — Z5111 Encounter for antineoplastic chemotherapy: Secondary | ICD-10-CM | POA: Diagnosis not present

## 2023-10-14 MED ORDER — PEGFILGRASTIM-JMDB 6 MG/0.6ML ~~LOC~~ SOSY
6.0000 mg | PREFILLED_SYRINGE | Freq: Once | SUBCUTANEOUS | Status: AC
  Administered 2023-10-14: 6 mg via SUBCUTANEOUS
  Filled 2023-10-14: qty 0.6

## 2023-10-14 NOTE — Patient Instructions (Signed)
 [facility] - A DEPT OF [place]  Discharge Instructions: Thank you for choosing [facility] to provide your oncology and hematology care.   If you have a lab appointment with the Cancer Center, please go directly to the Cancer Center and check in at the registration area.   Wear comfortable clothing and clothing appropriate for easy access to any Portacath or PICC line.   We strive to give you quality time with your provider. You may need to reschedule your appointment if you arrive late (15 or more minutes).  Arriving late affects you and other patients whose appointments are after yours.  Also, if you miss three or more appointments without notifying the office, you may be dismissed from the clinic at the provider's discretion.      For prescription refill requests, have your pharmacy contact our office and allow 72 hours for refills to be completed.    Today you received the following chemotherapy and/or immunotherapy agents: fulphila .      To help prevent nausea and vomiting after your treatment, we encourage you to take your nausea medication as directed.  BELOW ARE SYMPTOMS THAT SHOULD BE REPORTED IMMEDIATELY: *FEVER GREATER THAN 100.4 F (38 C) OR HIGHER *CHILLS OR SWEATING *NAUSEA AND VOMITING THAT IS NOT CONTROLLED WITH YOUR NAUSEA MEDICATION *UNUSUAL SHORTNESS OF BREATH *UNUSUAL BRUISING OR BLEEDING *URINARY PROBLEMS (pain or burning when urinating, or frequent urination) *BOWEL PROBLEMS (unusual diarrhea, constipation, pain near the anus) TENDERNESS IN MOUTH AND THROAT WITH OR WITHOUT PRESENCE OF ULCERS (sore throat, sores in mouth, or a toothache) UNUSUAL RASH, SWELLING OR PAIN  UNUSUAL VAGINAL DISCHARGE OR ITCHING   Items with * indicate a potential emergency and should be followed up as soon as possible or go to the Emergency Department if any problems should occur.  Please show the CHEMOTHERAPY ALERT CARD or IMMUNOTHERAPY  ALERT CARD at check-in to the Emergency Department and triage nurse.  Should you have questions after your visit or need to cancel or reschedule your appointment, please contact [facility] - [facility] DEPT [facility]  Dept: [phone]  and follow the prompts.  Office hours are 8:00 a.m. to 4:30 p.m. Monday - Friday. Please note that voicemails left after 4:00 p.m. may not be returned until the following business day.  We are closed weekends and major holidays. You have access to a nurse at all times for urgent questions. Please call the main number to the clinic Dept: [phone] and follow the prompts.   For any non-urgent questions, you may also contact your provider using MyChart. We now offer e-Visits for anyone 66 and older to request care online for non-urgent symptoms. For details visit [email or web address].   Also download the MyChart app! Go to the app store, search MyChart, open the app, select , and log in with your MyChart username and password.

## 2023-10-18 DIAGNOSIS — J441 Chronic obstructive pulmonary disease with (acute) exacerbation: Secondary | ICD-10-CM | POA: Diagnosis not present

## 2023-10-18 DIAGNOSIS — A419 Sepsis, unspecified organism: Secondary | ICD-10-CM | POA: Diagnosis not present

## 2023-10-18 DIAGNOSIS — J189 Pneumonia, unspecified organism: Secondary | ICD-10-CM | POA: Diagnosis not present

## 2023-10-20 ENCOUNTER — Ambulatory Visit (HOSPITAL_BASED_OUTPATIENT_CLINIC_OR_DEPARTMENT_OTHER): Payer: Self-pay | Admitting: Cardiology

## 2023-10-22 ENCOUNTER — Other Ambulatory Visit: Payer: Self-pay | Admitting: Oncology

## 2023-10-25 ENCOUNTER — Telehealth: Payer: Self-pay

## 2023-10-25 ENCOUNTER — Inpatient Hospital Stay

## 2023-10-25 ENCOUNTER — Inpatient Hospital Stay (HOSPITAL_BASED_OUTPATIENT_CLINIC_OR_DEPARTMENT_OTHER): Admitting: Oncology

## 2023-10-25 ENCOUNTER — Encounter: Payer: Self-pay | Admitting: Oncology

## 2023-10-25 VITALS — BP 135/62 | HR 87 | Temp 98.0°F | Resp 18 | Ht 66.0 in | Wt 225.7 lb

## 2023-10-25 DIAGNOSIS — Z720 Tobacco use: Secondary | ICD-10-CM | POA: Diagnosis not present

## 2023-10-25 DIAGNOSIS — I4892 Unspecified atrial flutter: Secondary | ICD-10-CM | POA: Diagnosis not present

## 2023-10-25 DIAGNOSIS — E785 Hyperlipidemia, unspecified: Secondary | ICD-10-CM | POA: Diagnosis not present

## 2023-10-25 DIAGNOSIS — C240 Malignant neoplasm of extrahepatic bile duct: Secondary | ICD-10-CM | POA: Diagnosis not present

## 2023-10-25 DIAGNOSIS — T451X5A Adverse effect of antineoplastic and immunosuppressive drugs, initial encounter: Secondary | ICD-10-CM | POA: Diagnosis not present

## 2023-10-25 DIAGNOSIS — Z8546 Personal history of malignant neoplasm of prostate: Secondary | ICD-10-CM | POA: Diagnosis not present

## 2023-10-25 DIAGNOSIS — Z7901 Long term (current) use of anticoagulants: Secondary | ICD-10-CM | POA: Diagnosis not present

## 2023-10-25 DIAGNOSIS — C221 Intrahepatic bile duct carcinoma: Secondary | ICD-10-CM | POA: Diagnosis not present

## 2023-10-25 DIAGNOSIS — Z5111 Encounter for antineoplastic chemotherapy: Secondary | ICD-10-CM | POA: Diagnosis not present

## 2023-10-25 DIAGNOSIS — J449 Chronic obstructive pulmonary disease, unspecified: Secondary | ICD-10-CM | POA: Diagnosis not present

## 2023-10-25 DIAGNOSIS — Z79899 Other long term (current) drug therapy: Secondary | ICD-10-CM | POA: Diagnosis not present

## 2023-10-25 DIAGNOSIS — D6959 Other secondary thrombocytopenia: Secondary | ICD-10-CM | POA: Diagnosis not present

## 2023-10-25 DIAGNOSIS — I10 Essential (primary) hypertension: Secondary | ICD-10-CM | POA: Diagnosis not present

## 2023-10-25 LAB — TSH: TSH: 0.451 u[IU]/mL (ref 0.350–4.500)

## 2023-10-25 LAB — CBC WITH DIFFERENTIAL (CANCER CENTER ONLY)
Abs Immature Granulocytes: 0.15 K/uL — ABNORMAL HIGH (ref 0.00–0.07)
Basophils Absolute: 0.1 K/uL (ref 0.0–0.1)
Basophils Relative: 0 %
Eosinophils Absolute: 0.2 K/uL (ref 0.0–0.5)
Eosinophils Relative: 1 %
HCT: 30.5 % — ABNORMAL LOW (ref 39.0–52.0)
Hemoglobin: 10.1 g/dL — ABNORMAL LOW (ref 13.0–17.0)
Immature Granulocytes: 1 %
Lymphocytes Relative: 15 %
Lymphs Abs: 1.8 K/uL (ref 0.7–4.0)
MCH: 35.8 pg — ABNORMAL HIGH (ref 26.0–34.0)
MCHC: 33.1 g/dL (ref 30.0–36.0)
MCV: 108.2 fL — ABNORMAL HIGH (ref 80.0–100.0)
Monocytes Absolute: 1.9 K/uL — ABNORMAL HIGH (ref 0.1–1.0)
Monocytes Relative: 17 %
Neutro Abs: 7.4 K/uL (ref 1.7–7.7)
Neutrophils Relative %: 66 %
Platelet Count: 65 K/uL — ABNORMAL LOW (ref 150–400)
RBC: 2.82 MIL/uL — ABNORMAL LOW (ref 4.22–5.81)
RDW: 16.9 % — ABNORMAL HIGH (ref 11.5–15.5)
WBC Count: 11.4 K/uL — ABNORMAL HIGH (ref 4.0–10.5)
nRBC: 0 % (ref 0.0–0.2)

## 2023-10-25 LAB — CMP (CANCER CENTER ONLY)
ALT: 16 U/L (ref 0–44)
AST: 22 U/L (ref 15–41)
Albumin: 3.9 g/dL (ref 3.5–5.0)
Alkaline Phosphatase: 101 U/L (ref 38–126)
Anion gap: 8 (ref 5–15)
BUN: 12 mg/dL (ref 8–23)
CO2: 32 mmol/L (ref 22–32)
Calcium: 9.9 mg/dL (ref 8.9–10.3)
Chloride: 97 mmol/L — ABNORMAL LOW (ref 98–111)
Creatinine: 0.82 mg/dL (ref 0.61–1.24)
GFR, Estimated: >60 mL/min (ref >60)
Glucose, Bld: 106 mg/dL — ABNORMAL HIGH (ref 70–99)
Potassium: 3.4 mmol/L — ABNORMAL LOW (ref 3.5–5.1)
Sodium: 137 mmol/L (ref 135–145)
Total Bilirubin: 0.3 mg/dL (ref 0.0–1.2)
Total Protein: 6.1 g/dL — ABNORMAL LOW (ref 6.5–8.1)

## 2023-10-25 LAB — MAGNESIUM: Magnesium: 1.7 mg/dL (ref 1.7–2.4)

## 2023-10-25 NOTE — Progress Notes (Signed)
 [name] OFFICE PROGRESS NOTE   Diagnosis: Cholangiocarcinoma  INTERVAL HISTORY:   [name] completed day 8 cycle 5 of chemotherapy on 10/12/2023.  No nausea/vomiting, rash, fever, or neuropathy symptoms.  He feels well.  He is working.  He notes he bruises and bleeds for readily with trauma.  Objective:  Vital signs in last 24 hours:  Blood pressure 135/62, pulse 87, temperature 98 F (36.7 C), temperature source Temporal, resp. rate 18, height 5' 6 (1.676 m), weight 225 lb 11.2 oz (102.4 kg), SpO2 98%.    HEENT: No thrush or ulcers Resp: Distant breath sounds with scattered inspiratory/expiratory rhonchi and wheezes, no respiratory distress Cardio: Distant heart sounds, regular rhythm GI: Nontender, no mass, no hepatosplenomegaly Vascular: Chronic stasis change at the lower leg bilaterally   Portacath/PICC-without erythema  Lab Results:  Lab Results  Component Value Date   WBC 11.4 (H) 10/25/2023   HGB 10.1 (L) 10/25/2023   HCT 30.5 (L) 10/25/2023   MCV 108.2 (H) 10/25/2023   PLT 65 (L) 10/25/2023   NEUTROABS 7.4 10/25/2023    CMP  Lab Results  Component Value Date   NA 139 10/11/2023   K 4.0 10/11/2023   CL 100 10/11/2023   CO2 29 10/11/2023   GLUCOSE 128 (H) 10/11/2023   BUN 14 10/11/2023   CREATININE 0.55 (L) 10/11/2023   CALCIUM  10.0 10/11/2023   PROT 6.2 (L) 10/11/2023   ALBUMIN 3.7 10/11/2023   AST 25 10/11/2023   ALT 37 10/11/2023   ALKPHOS 74 10/11/2023   BILITOT 0.3 10/11/2023   GFRNONAA >60 10/11/2023   GFRAA 117 03/21/2020    Lab Results  Component Value Date   CEA1 4.8 (H) 05/03/2023   CEA 3.86 10/04/2023   CAN199 68 (H) 10/04/2023    Medications: I have reviewed the patient's current medications.   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 with dilation of the left and right biliary systems, left and right hepatic stents and temporary pancreatic stent placed, stricture brushing-adenocarcinoma; HER2 IHC result equivocal (2+), FISH results indeterminant; Tempus-microsatellite stable, tumor mutation burden less than 10, ERBB2 expression, BRCA2 mutated CT chest 05/06/2023: No evidence of metastatic disease Elevated CA 19-9 on 05/03/2023 CT abdomen 05/27/2023 (due), decreased Intermatic biliary duct dilation, status post stenting of common bile duct, persistent soft tissue at the hepatic hilum, pancreatic duct stent in place Upper endoscopy 06/16/2023-gastritis.  Duodenitis.  Plastic biliary and pancreatic stents noted at the major papilla.  Pancreatic stent removed. Cycle 1 gemcitabine /cisplatin /Durvalumab  06/20/2023, day 8 06/28/2023 (Gemcitabine  dose reduced due to thrombocytopenia), Fulphila  Cycle 2 gemcitabine /cisplatin /Durvalumab  07/14/2023, day 8 07/21/2023, Fulphila  Cycle 3 gemcitabine /cisplatin /durvalumab  08/09/2023 (Gem dose reduced C3D8 for thrombocytopenia) Cycle 4 Day 1 gem/cisplatin /durvalumab  08/31/23, gem dose reduced; gemcitabine /cisplatin  09/08/2023 09/23/2023 CT abdomen: No change in the ill-defined soft tissue surrounding the common bile duct, stable periportal and retroperitoneal lymph nodes, biliary stents in place with decreased intrahepatic duct dilation Cycle 5-day 1 gemcitabine /cisplatin /durvalumab  10/05/2023, day 8 10/12/2023 Cycle 6-day 1 gemcitabine /cisplatin /durvalumab  11/02/2023 (gemcitabine  further dose reduced) Biliary obstruction secondary #1, status post placement of right and left hepatic duct stents 05/06/2023 Prostate cancer, status post prostatectomy in 2018,pT3a,pN0, Gleason  7 COPD Hypertension Hyperlipidemia Tobacco use BRCA2  gene mutation-referred to genetics 07/13/2023 Right lower quadrant pain/tenderness-referred for CTs 07/20/2023 New onset atrial flutter 08/15/2023 - on Eliquis  Thrombocytopenia secondary to chemotherapy   Disposition: [name] appears stable.  He has completed 5 cycles of gemcitabine /cisplatin /durvalumab .  The CA 19-9 was lower prior to cycle 5.  He is tolerating the chemotherapy well.  He has moderate thrombocytopenia today.  Cycle 6 will be delayed secondary to thrombocytopenia.  I further dose reduced the gemcitabine .  He will call for spontaneous bleeding or bruising.  [name] will return for an office visit and day 1 cycle 6 chemotherapy in 1 week.  He will complete 8 cycles of systemic therapy prior to a restaging evaluation.  I recommended he obtain a pneumococcal  21 vaccine.  He will receive an influenza vaccine here within the next few weeks.  Arley Hof, MD  10/25/2023  2:35 PM

## 2023-10-25 NOTE — Telephone Encounter (Signed)
 Dr Cloretta ok to hold Eliquis  for this ERCP on 10/27?

## 2023-10-25 NOTE — Telephone Encounter (Signed)
-----   Message from Nurse Shalayne Leach P sent at 10/10/2023 11:18 AM EDT ----- Make sure to have Eliquis  hold

## 2023-10-25 NOTE — Patient Instructions (Signed)

## 2023-10-26 ENCOUNTER — Telehealth: Payer: Self-pay | Admitting: Cardiology

## 2023-10-26 ENCOUNTER — Inpatient Hospital Stay

## 2023-10-26 DIAGNOSIS — R059 Cough, unspecified: Secondary | ICD-10-CM | POA: Diagnosis not present

## 2023-10-26 DIAGNOSIS — J441 Chronic obstructive pulmonary disease with (acute) exacerbation: Secondary | ICD-10-CM | POA: Diagnosis not present

## 2023-10-26 DIAGNOSIS — R051 Acute cough: Secondary | ICD-10-CM | POA: Diagnosis not present

## 2023-10-26 DIAGNOSIS — R0600 Dyspnea, unspecified: Secondary | ICD-10-CM | POA: Diagnosis not present

## 2023-10-26 LAB — T4: T4, Total: 8 ug/dL (ref 4.5–12.0)

## 2023-10-26 NOTE — Telephone Encounter (Signed)
 Pt PCP, Otto Mulders, FNP, calling in wanting to make Dr. Lonni aware of medical findings from appt today. She states pt has had difficulty breathing especially when laying down flat. He has bilateral edema. She also states pt told her he has gained 20 lbs in two weeks. She sent him to get an xray today and she will c/b once xray is available. She states he sounds wet and crackly.

## 2023-10-27 NOTE — Telephone Encounter (Signed)
 Never heard back from PCP  Called patient and left message to call back   Mychart message to patient

## 2023-10-30 ENCOUNTER — Other Ambulatory Visit: Payer: Self-pay

## 2023-10-31 ENCOUNTER — Telehealth: Payer: Self-pay | Admitting: Gastroenterology

## 2023-10-31 ENCOUNTER — Encounter (HOSPITAL_COMMUNITY): Payer: Self-pay | Admitting: Gastroenterology

## 2023-10-31 NOTE — Telephone Encounter (Signed)
 Dr Shelda please see note below     PCP-Kirk FNP Cardiologist-Christopher MD  Pulmonologist- n/a  EKG-09/15/23 Echo-09/19/23 Cath-n/a Stress-n/a ICD/PM-n/a GLP1-n/a Blood Thinner-Eliquis  (office still working on getting pharm clearance from pcp)  History: HTN,COPD, Afib,Cholangiocarcinoma. Patient last saw his pcp 10/15 and at that appt he reports his pcp was concerned about some swelling in his feet and when he lays down having some SOB. Per pt he was concerned about recurrent PNA but they did an xray day of that was normal but ordered a small dose of steroids. Patient says he feels better, swelling is better, does have some dyspnea when laying down but comes and goes, no other resp symptoms. There is no notes yet from cardiology about this but his pcp did want his cardiologist to know about this. He last saw cardiology 08/19/23 they ordered him to have a cardioversion 9/4 but converted before needed.  Anesthesia Review- Yes  11 mins JW Jessica Z Ward, PA-C I think we need cardio clearance since those sx sound like HF sx

## 2023-10-31 NOTE — Telephone Encounter (Signed)
 Anti coag letter sent to Dr Cloretta

## 2023-10-31 NOTE — Progress Notes (Signed)
 Pre op call Robert [name]   PCP-Kirk FNP Cardiologist-Christopher MD  Pulmonologist- n/a  EKG-09/15/23 Echo-09/19/23 Cath-n/a Stress-n/a ICD/PM-n/a GLP1-n/a Blood Thinner-Eliquis  (office still working on getting pharm clearance from pcp)  History: HTN,COPD, Afib,Cholangiocarcinoma. Patient last saw his pcp 10/15 and at that appt he reports his pcp was concerned about some swelling in his feet and when he lays down having some SOB. Per pt he was concerned about recurrent PNA but they did an xray day of that was normal but ordered a small dose of steroids. Patient says he feels better, swelling is better, does have some dyspnea when laying down but comes and goes, no other resp symptoms. There is no notes yet from cardiology about this but his pcp did want his cardiologist to know about this. He last saw cardiology 08/19/23 they ordered him to have a cardioversion 9/4 but converted before needed.  Anesthesia Review- Yes- needs cardiac clearance, office notified via epic chat 10/20/cb

## 2023-10-31 NOTE — Telephone Encounter (Signed)
 Procedure:ERCP Procedure date: 11/07/23 Procedure location: [facility] Arrival Time: 8:15 am Spoke with the patient Y/N: Yes Any prep concerns? No  Has the patient obtained the prep from the pharmacy ? No prep needed Do you have a care partner and transportation: Yes Any additional concerns? No

## 2023-11-01 ENCOUNTER — Inpatient Hospital Stay: Admitting: Nurse Practitioner

## 2023-11-01 ENCOUNTER — Encounter: Payer: Self-pay | Admitting: Nurse Practitioner

## 2023-11-01 ENCOUNTER — Inpatient Hospital Stay

## 2023-11-01 VITALS — BP 135/79 | HR 84 | Temp 97.3°F | Resp 18 | Wt 210.7 lb

## 2023-11-01 DIAGNOSIS — C240 Malignant neoplasm of extrahepatic bile duct: Secondary | ICD-10-CM

## 2023-11-01 DIAGNOSIS — C221 Intrahepatic bile duct carcinoma: Secondary | ICD-10-CM | POA: Diagnosis not present

## 2023-11-01 DIAGNOSIS — J449 Chronic obstructive pulmonary disease, unspecified: Secondary | ICD-10-CM | POA: Diagnosis not present

## 2023-11-01 DIAGNOSIS — T451X5A Adverse effect of antineoplastic and immunosuppressive drugs, initial encounter: Secondary | ICD-10-CM | POA: Diagnosis not present

## 2023-11-01 DIAGNOSIS — Z79899 Other long term (current) drug therapy: Secondary | ICD-10-CM | POA: Diagnosis not present

## 2023-11-01 DIAGNOSIS — Z8546 Personal history of malignant neoplasm of prostate: Secondary | ICD-10-CM | POA: Diagnosis not present

## 2023-11-01 DIAGNOSIS — I4892 Unspecified atrial flutter: Secondary | ICD-10-CM

## 2023-11-01 DIAGNOSIS — E785 Hyperlipidemia, unspecified: Secondary | ICD-10-CM | POA: Diagnosis not present

## 2023-11-01 DIAGNOSIS — Z720 Tobacco use: Secondary | ICD-10-CM | POA: Diagnosis not present

## 2023-11-01 DIAGNOSIS — Z5111 Encounter for antineoplastic chemotherapy: Secondary | ICD-10-CM | POA: Diagnosis not present

## 2023-11-01 DIAGNOSIS — D6959 Other secondary thrombocytopenia: Secondary | ICD-10-CM | POA: Diagnosis not present

## 2023-11-01 DIAGNOSIS — Z7901 Long term (current) use of anticoagulants: Secondary | ICD-10-CM | POA: Diagnosis not present

## 2023-11-01 DIAGNOSIS — I10 Essential (primary) hypertension: Secondary | ICD-10-CM | POA: Diagnosis not present

## 2023-11-01 LAB — CBC WITH DIFFERENTIAL (CANCER CENTER ONLY)
Abs Immature Granulocytes: 0.08 K/uL — ABNORMAL HIGH (ref 0.00–0.07)
Basophils Absolute: 0 K/uL (ref 0.0–0.1)
Basophils Relative: 1 %
Eosinophils Absolute: 0.1 K/uL (ref 0.0–0.5)
Eosinophils Relative: 2 %
HCT: 38.6 % — ABNORMAL LOW (ref 39.0–52.0)
Hemoglobin: 12.5 g/dL — ABNORMAL LOW (ref 13.0–17.0)
Immature Granulocytes: 1 %
Lymphocytes Relative: 27 %
Lymphs Abs: 2.1 K/uL (ref 0.7–4.0)
MCH: 34.2 pg — ABNORMAL HIGH (ref 26.0–34.0)
MCHC: 32.4 g/dL (ref 30.0–36.0)
MCV: 105.8 fL — ABNORMAL HIGH (ref 80.0–100.0)
Monocytes Absolute: 1.5 K/uL — ABNORMAL HIGH (ref 0.1–1.0)
Monocytes Relative: 20 %
Neutro Abs: 3.9 K/uL (ref 1.7–7.7)
Neutrophils Relative %: 49 %
Platelet Count: 166 K/uL (ref 150–400)
RBC: 3.65 MIL/uL — ABNORMAL LOW (ref 4.22–5.81)
RDW: 15.4 % (ref 11.5–15.5)
WBC Count: 7.8 K/uL (ref 4.0–10.5)
nRBC: 0 % (ref 0.0–0.2)

## 2023-11-01 LAB — CMP (CANCER CENTER ONLY)
ALT: 15 U/L (ref 0–44)
AST: 20 U/L (ref 15–41)
Albumin: 4.2 g/dL (ref 3.5–5.0)
Alkaline Phosphatase: 103 U/L (ref 38–126)
Anion gap: 9 (ref 5–15)
BUN: 16 mg/dL (ref 8–23)
CO2: 31 mmol/L (ref 22–32)
Calcium: 10.3 mg/dL (ref 8.9–10.3)
Chloride: 97 mmol/L — ABNORMAL LOW (ref 98–111)
Creatinine: 0.64 mg/dL (ref 0.61–1.24)
GFR, Estimated: >60 mL/min (ref >60)
Glucose, Bld: 127 mg/dL — ABNORMAL HIGH (ref 70–99)
Potassium: 4.4 mmol/L (ref 3.5–5.1)
Sodium: 137 mmol/L (ref 135–145)
Total Bilirubin: 0.3 mg/dL (ref 0.0–1.2)
Total Protein: 7.3 g/dL (ref 6.5–8.1)

## 2023-11-01 LAB — PRO BRAIN NATRIURETIC PEPTIDE: Pro Brain Natriuretic Peptide: 1309 pg/mL — ABNORMAL HIGH (ref <300.0)

## 2023-11-01 LAB — TSH: TSH: 1.1 u[IU]/mL (ref 0.350–4.500)

## 2023-11-01 LAB — MAGNESIUM: Magnesium: 1.9 mg/dL (ref 1.7–2.4)

## 2023-11-01 MED ORDER — FUROSEMIDE 20 MG PO TABS: 20.0000 mg | ORAL_TABLET | Freq: Every day | ORAL | 0 refills | Status: DC

## 2023-11-01 NOTE — Progress Notes (Signed)
 [facility] OFFICE PROGRESS NOTE   Diagnosis: Cholangiocarcinoma  INTERVAL HISTORY:   Robert [name] returns as scheduled.  He has completed 5 cycles of chemotherapy.  Cycle 6 was held last week due to thrombocytopenia.  The day after his office visit last week he developed shortness of breath, leg swelling, upper respiratory symptoms.  He reports having a chest x-ray by PCP, unremarkable.  He completed steroids and a Z-Pak.  Overall feeling better but continues to note dyspnea on exertion.  No chest pain.  No fever.  No rash.  No significant diarrhea.  Objective:  Vital signs in last 24 hours:  Blood pressure 135/79, pulse 84, temperature (!) 97.3 F (36.3 C), temperature source Temporal, resp. rate 18, weight 210 lb 11.2 oz (95.6 kg), SpO2 93%.    HEENT: No thrush or ulcers. Resp: Distant breath sounds.  Bilateral expiratory wheezes.  No respiratory distress. Cardio: Distant heart sounds.  Regular rate and rhythm. GI: No hepatosplenomegaly.  Nontender. Vascular: Trace to 1+ pitting edema, chronic stasis change lower leg bilaterally. Skin: No rash. Port-A-Cath without erythema.  Lab Results:  Lab Results  Component Value Date   WBC 7.8 11/01/2023   HGB 12.5 (L) 11/01/2023   HCT 38.6 (L) 11/01/2023   MCV 105.8 (H) 11/01/2023   PLT 166 11/01/2023   NEUTROABS 3.9 11/01/2023    Imaging:  No results found.  Medications: I have reviewed the patient's current medications.  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 with dilation of the left and right biliary systems,  left and right hepatic stents and temporary pancreatic stent placed, stricture brushing-adenocarcinoma; HER2 IHC result equivocal (2+), FISH results indeterminant; Tempus-microsatellite stable, tumor mutation burden less than 10, ERBB2 expression, BRCA2 mutated CT chest 05/06/2023: No evidence of metastatic disease Elevated CA 19-9 on 05/03/2023 CT abdomen 05/27/2023 (due), decreased Intermatic biliary duct dilation, status post stenting of common bile duct, persistent soft tissue at the hepatic hilum, pancreatic duct stent in place Upper endoscopy 06/16/2023-gastritis.  Duodenitis.  Plastic biliary and pancreatic stents noted at the major papilla.  Pancreatic stent removed. Cycle 1 gemcitabine /cisplatin /Durvalumab  06/20/2023, day 8 06/28/2023 (Gemcitabine  dose reduced due to thrombocytopenia), Fulphila  Cycle 2 gemcitabine /cisplatin /Durvalumab  07/14/2023, day 8 07/21/2023, Fulphila  Cycle 3 gemcitabine /cisplatin /durvalumab  08/09/2023 (Gem dose reduced C3D8 for thrombocytopenia) Cycle 4 Day 1 gem/cisplatin /durvalumab  08/31/23, gem dose reduced; gemcitabine /cisplatin  09/08/2023 09/23/2023 CT abdomen: No change in the ill-defined soft tissue surrounding the common bile duct, stable periportal and retroperitoneal lymph nodes, biliary stents in place with decreased intrahepatic duct dilation Cycle 5-day 1 gemcitabine /cisplatin /durvalumab  10/05/2023, day 8 10/12/2023 Cycle 6-day 1 gemcitabine /cisplatin /durvalumab  11/02/2023 (gemcitabine  further dose reduced) Biliary obstruction secondary #1, status post placement of right and left hepatic duct stents 05/06/2023 Prostate cancer, status post prostatectomy in 2018,pT3a,pN0, Gleason 7 COPD Hypertension Hyperlipidemia Tobacco use 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  Cycle 6-day 1 was held last week due to thrombocytopenia.  We reviewed the CBC from today.  Counts are adequate for treatment.  Platelet count is now in normal range.  Plan  to proceed with cycle 6 as scheduled 11/02/2023.  Gemcitabine  further dose reduced.  Pulmonary referral placed for management of COPD.  He will return for follow-up and cycle 6-day 8 in 1 week.  We are available to see him sooner if needed.  Patient seen with Dr. Cloretta.    [name] ANP/GNP-BC   11/01/2023  2:00 PM  Addendum 4:13 PM-proBNP returned elevated at 1309.  Reviewed with cardiology.  Recommendation for Lasix  20 mg daily for 3 days.  They will adjust his next appointment to a sooner date.  Discussed with Robert [name] by phone.  LT This was a shared visit with [name].  Robert [name] was interviewed and examined.  He is up to increased dyspnea and leg swelling over the past week.  I suspect his symptoms are related to COPD with high right sided heart pressures.  He will begin a trial of furosemide .  The platelet count has recovered.  The plan is to proceed with chemotherapy tomorrow.  Arvella Cloretta, MD

## 2023-11-01 NOTE — Telephone Encounter (Signed)
 Reached patient today on phone.  He had appointment today at oncology.  He states he feels pretty good . The swelling has improved from last week.  He is still short of breath, it has gotten a little better, not worse.  Oncology saw him today and placed referral to pulmonary for COPD.  Denies chest pain.  Breathing is worse on exertion.  Wheezing present with exertion.  Does not wear home O2.  Pulse ox readings at home are 94-95% on room air.  HRs 70s.   While on the phone, his oncology NP called let him know they were sending in Lasix  20 mg daily and to follow up with cardiology.  I scheduled him sooner follow up for this Friday 10/24 with APP.   Pt grateful for assistance today.

## 2023-11-01 NOTE — Telephone Encounter (Signed)
 Yes, thank you they are requesting clearance from cardiology stand point as well as the ok to stop Eliquis  before the 10/27 procedure. Anything you can do to help so the pt can proceed is greatly appreciated.  Thank you

## 2023-11-02 ENCOUNTER — Encounter: Payer: Self-pay | Admitting: Oncology

## 2023-11-02 ENCOUNTER — Inpatient Hospital Stay

## 2023-11-02 VITALS — BP 156/85 | HR 76 | Temp 97.8°F | Resp 18

## 2023-11-02 DIAGNOSIS — C240 Malignant neoplasm of extrahepatic bile duct: Secondary | ICD-10-CM

## 2023-11-02 DIAGNOSIS — E785 Hyperlipidemia, unspecified: Secondary | ICD-10-CM | POA: Diagnosis not present

## 2023-11-02 DIAGNOSIS — C221 Intrahepatic bile duct carcinoma: Secondary | ICD-10-CM | POA: Diagnosis not present

## 2023-11-02 LAB — T4: T4, Total: 10.1 ug/dL (ref 4.5–12.0)

## 2023-11-02 MED ORDER — SODIUM CHLORIDE 0.9 % IV SOLN
700.0000 mg/m2 | Freq: Once | INTRAVENOUS | Status: AC
  Administered 2023-11-02: 1482 mg via INTRAVENOUS
  Filled 2023-11-02: qty 25.98

## 2023-11-02 MED ORDER — SODIUM CHLORIDE 0.9 % IV SOLN
1500.0000 mg | Freq: Once | INTRAVENOUS | Status: AC
  Administered 2023-11-02: 1500 mg via INTRAVENOUS
  Filled 2023-11-02: qty 30

## 2023-11-02 MED ORDER — SODIUM CHLORIDE 0.9 % IV SOLN
150.0000 mg | Freq: Once | INTRAVENOUS | Status: AC
  Administered 2023-11-02: 150 mg via INTRAVENOUS
  Filled 2023-11-02: qty 150

## 2023-11-02 MED ORDER — PALONOSETRON HCL INJECTION 0.25 MG/5ML
0.2500 mg | Freq: Once | INTRAVENOUS | Status: AC
  Administered 2023-11-02: 0.25 mg via INTRAVENOUS
  Filled 2023-11-02: qty 5

## 2023-11-02 MED ORDER — DEXAMETHASONE SOD PHOSPHATE PF 10 MG/ML IJ SOLN
10.0000 mg | Freq: Once | INTRAMUSCULAR | Status: AC
  Administered 2023-11-02: 10 mg via INTRAVENOUS

## 2023-11-02 MED ORDER — MAGNESIUM SULFATE 2 GM/50ML IV SOLN
2.0000 g | Freq: Once | INTRAVENOUS | Status: AC
  Administered 2023-11-02: 2 g via INTRAVENOUS
  Filled 2023-11-02: qty 50

## 2023-11-02 MED ORDER — SODIUM CHLORIDE 0.9 % IV SOLN: INTRAVENOUS | Status: DC

## 2023-11-02 MED ORDER — POTASSIUM CHLORIDE IN NACL 20-0.9 MEQ/L-% IV SOLN
Freq: Once | INTRAVENOUS | Status: AC
  Filled 2023-11-02: qty 1000

## 2023-11-02 MED ORDER — SODIUM CHLORIDE 0.9 % IV SOLN
25.0000 mg/m2 | Freq: Once | INTRAVENOUS | Status: AC
  Administered 2023-11-02: 50 mg via INTRAVENOUS
  Filled 2023-11-02: qty 50

## 2023-11-02 NOTE — Patient Instructions (Signed)
 [facility] - A DEPT OF [facility]  Discharge Instructions: Thank you for choosing [facility] to provide your oncology and hematology care.   If you have a lab appointment with the Cancer Center, please go directly to the Cancer Center and check in at the registration area.   Wear comfortable clothing and clothing appropriate for easy access to any Portacath or PICC line.   We strive to give you quality time with your provider. You may need to reschedule your appointment if you arrive late (15 or more minutes).  Arriving late affects you and other patients whose appointments are after yours.  Also, if you miss three or more appointments without notifying the office, you may be dismissed from the clinic at the provider's discretion.      For prescription refill requests, have your pharmacy contact our office and allow 72 hours for refills to be completed.    Today you received the following chemotherapy and/or immunotherapy agents: imfinzi , gemzar , cisplatin       To help prevent nausea and vomiting after your treatment, we encourage you to take your nausea medication as directed.  BELOW ARE SYMPTOMS THAT SHOULD BE REPORTED IMMEDIATELY: *FEVER GREATER THAN 100.4 F (38 C) OR HIGHER *CHILLS OR SWEATING *NAUSEA AND VOMITING THAT IS NOT CONTROLLED WITH YOUR NAUSEA MEDICATION *UNUSUAL SHORTNESS OF BREATH *UNUSUAL BRUISING OR BLEEDING *URINARY PROBLEMS (pain or burning when urinating, or frequent urination) *BOWEL PROBLEMS (unusual diarrhea, constipation, pain near the anus) TENDERNESS IN MOUTH AND THROAT WITH OR WITHOUT PRESENCE OF ULCERS (sore throat, sores in mouth, or a toothache) UNUSUAL RASH, SWELLING OR PAIN  UNUSUAL VAGINAL DISCHARGE OR ITCHING   Items with * indicate a potential emergency and should be followed up as soon as possible or go to the Emergency Department if any problems should occur.  Please show the CHEMOTHERAPY ALERT CARD  or IMMUNOTHERAPY ALERT CARD at check-in to the Emergency Department and triage nurse.  Should you have questions after your visit or need to cancel or reschedule your appointment, please contact [facility] - [facility] DEPT [facility]  Dept: [phone]  and follow the prompts.  Office hours are 8:00 a.m. to 4:30 p.m. Monday - Friday. Please note that voicemails left after 4:00 p.m. may not be returned until the following business day.  We are closed weekends and major holidays. You have access to a nurse at all times for urgent questions. Please call the main number to the clinic Dept: [phone] and follow the prompts.   For any non-urgent questions, you may also contact your provider using MyChart. We now offer e-Visits for anyone 35 and older to request care online for non-urgent symptoms. For details visit [email or web address].   Also download the MyChart app! Go to the app store, search MyChart, open the app, select [place], and log in with your MyChart username and password.

## 2023-11-04 ENCOUNTER — Ambulatory Visit (HOSPITAL_BASED_OUTPATIENT_CLINIC_OR_DEPARTMENT_OTHER): Admitting: Family

## 2023-11-04 ENCOUNTER — Encounter (HOSPITAL_BASED_OUTPATIENT_CLINIC_OR_DEPARTMENT_OTHER): Payer: Self-pay | Admitting: Family

## 2023-11-04 ENCOUNTER — Inpatient Hospital Stay

## 2023-11-04 VITALS — BP 110/66 | HR 77 | Ht 66.0 in | Wt 216.6 lb

## 2023-11-04 DIAGNOSIS — I272 Pulmonary hypertension, unspecified: Secondary | ICD-10-CM

## 2023-11-04 DIAGNOSIS — D6859 Other primary thrombophilia: Secondary | ICD-10-CM

## 2023-11-04 DIAGNOSIS — I10 Essential (primary) hypertension: Secondary | ICD-10-CM | POA: Diagnosis not present

## 2023-11-04 DIAGNOSIS — J449 Chronic obstructive pulmonary disease, unspecified: Secondary | ICD-10-CM

## 2023-11-04 DIAGNOSIS — I483 Typical atrial flutter: Secondary | ICD-10-CM

## 2023-11-04 MED ORDER — FUROSEMIDE 20 MG PO TABS: 20.0000 mg | ORAL_TABLET | Freq: Every day | ORAL | 3 refills | Status: AC

## 2023-11-04 MED ORDER — BENAZEPRIL-HYDROCHLOROTHIAZIDE 20-25 MG PO TABS: 0.5000 | ORAL_TABLET | Freq: Every day | ORAL | Status: AC

## 2023-11-04 NOTE — Progress Notes (Signed)
 Cardiology Office Note   Date:  11/04/2023  ID:  Robert [name], DOB 1956-01-08, MRN [id number] PCP: Omar Cumins, FNP  [facility] Providers Cardiologist:  Shelda Bruckner, MD     History of Present Illness Robert [name] is a 68 y.o. 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  Noted new onset atrial flutter, apixaban  added.  Lotensin  decreased due to hypotension.  He was scheduled for cardioversion 09/15/2023 however was in NSR on presentation.  Echo 09/19/2023 normal LVEF 55-60%, no RWMA, RV normal function, RV mildly enlarged, moderately elevated PASP, mild LAE, moderate RAE, mild MR, mild aortic stenosis.  Primary care reached out to our office 10/26/23 noting he was having difficulty breathing when laying flat, bilateral edema, weight gain of 20 pounds over 2 weeks.  Per oncology team no chest x-ray unremarkable had completed steroids and Z-Pak and feeling better though persistent DOE. HE had trace to 1+ pitting edema on exam at that time. He was referred to pulmonology for COPD. Pro BNp 1309, Lasix  20mg  daily x 3 days initiated.   He has ERCP upcoming 11/07/2023.  Presents today for follow up. Weight down 5 lbs since clinic visit 2 months ago. Reports his weight is back to his baseline. His normal weight per his report is 210-215 lbs. Weight today in clinic 216 lbs. Reports feeling better for several days after completing abx and prednisone , then dyspnea recurred. Reports likely snores but no daytime somnolence and wakes feeling well rested. Breathing last felt good 3-4 weeks ago.   ROS: Please see the history of present illness.    All other systems reviewed and are negative.   Studies Reviewed      Cardiac Studies & Procedures    ______________________________________________________________________________________________     ECHOCARDIOGRAM  ECHOCARDIOGRAM COMPLETE 09/19/2023  Narrative ECHOCARDIOGRAM REPORT    Patient Name:   Robert [name] Date of Exam: 09/19/2023 Medical Rec #:  [id number]   Height:       66.0 in Accession #:    [id number]  Weight:       215.0 lb Date of Birth:  1955/02/22   BSA:          2.062 m Patient Age:    68 years    BP:           128/62 mmHg Patient Gender: M           HR:           83 bpm. Exam Location:  Outpatient  Procedure: 2D Echo, 3D Echo, Color Doppler, Cardiac Doppler and Strain Analysis (Both Spectral and Color Flow Doppler were utilized during procedure).  Indications:    Atrial Flutter  History:        Patient has no prior history of Echocardiogram examinations. COPD; Risk Factors:Hypertension, Dyslipidemia and Current Smoker. Cholangiocarcinoma on chemotherapy, prior history of prostate cancer.  Sonographer:    Orvil Holmes RDCS Referring Phys: [id number] BRIDGETTE CHRISTOPHER  IMPRESSIONS   1. Left ventricular ejection fraction, by estimation, is 55 to 60%. Left ventricular ejection fraction by 3D volume is 59 %. The left ventricle has normal function. The left ventricle has no regional wall motion abnormalities. Left ventricular diastolic parameters were normal. The average left ventricular global longitudinal strain is -23.6 %. The global longitudinal strain is normal. 2. Right ventricular systolic function is normal. The right ventricular size is mildly enlarged.  There is moderately elevated pulmonary artery systolic pressure. The estimated right ventricular systolic pressure is 49.2 mmHg. 3. Left atrial size was mildly dilated. 4. Right atrial size was moderately dilated. 5. The mitral valve is normal in structure. Mild mitral valve regurgitation. No evidence of mitral stenosis. Moderate mitral annular calcification. 6. The aortic valve is calcified. Aortic  valve regurgitation is not visualized. Mild aortic valve stenosis. Aortic valve area, by VTI measures 1.77 cm. Aortic valve mean gradient measures 13.0 mmHg. Aortic valve Vmax measures 2.41 m/s. 7. Ascending aorta on recent chest CT 04/2023 demonstrated 34 mm (personally measured). Measurement on current echo is likely artifactual. 8. The inferior vena cava is dilated in size with >50% respiratory variability, suggesting right atrial pressure of 8 mmHg.  FINDINGS Left Ventricle: Left ventricular ejection fraction, by estimation, is 55 to 60%. Left ventricular ejection fraction by 3D volume is 59 %. The left ventricle has normal function. The left ventricle has no regional wall motion abnormalities. The average left ventricular global longitudinal strain is -23.6 %. Strain was performed and the global longitudinal strain is normal. The left ventricular internal cavity size was normal in size. There is no left ventricular hypertrophy. Left ventricular diastolic parameters were normal.  Right Ventricle: The right ventricular size is mildly enlarged. No increase in right ventricular wall thickness. Right ventricular systolic function is normal. There is moderately elevated pulmonary artery systolic pressure. The tricuspid regurgitant velocity is 3.21 m/s, and with an assumed right atrial pressure of 8 mmHg, the estimated right ventricular systolic pressure is 49.2 mmHg.  Left Atrium: Left atrial size was mildly dilated.  Right Atrium: Right atrial size was moderately dilated.  Pericardium: There is no evidence of pericardial effusion.  Mitral Valve: The mitral valve is normal in structure. Moderate mitral annular calcification. Mild mitral valve regurgitation. No evidence of mitral valve stenosis.  Tricuspid Valve: The tricuspid valve is normal in structure. Tricuspid valve regurgitation is mild . No evidence of tricuspid stenosis.  Aortic Valve: The aortic valve is calcified. Aortic valve  regurgitation is not visualized. Mild aortic stenosis is present. Aortic valve mean gradient measures 13.0 mmHg. Aortic valve peak gradient measures 23.2 mmHg. Aortic valve area, by VTI measures 1.77 cm.  Pulmonic Valve: The pulmonic valve was normal in structure. Pulmonic valve regurgitation is not visualized. No evidence of pulmonic stenosis.  Aorta: Ascending aorta on recent chest CT 04/2023 demonstrated 34 mm (personally measured). Measurement on current echo is likely artifactual. The aortic root is normal in size and structure.  Venous: The inferior vena cava is dilated in size with greater than 50% respiratory variability, suggesting right atrial pressure of 8 mmHg.  IAS/Shunts: No atrial level shunt detected by color flow Doppler.  Additional Comments: 3D was performed not requiring image post processing on an independent workstation and was normal.   LEFT VENTRICLE PLAX 2D LVIDd:         5.07 cm         Diastology LVIDs:         3.18 cm         LV e' medial:    10.00 cm/s LV PW:         1.19 cm         LV E/e' medial:  13.0 LV IVS:        0.86 cm         LV e' lateral:   12.70 cm/s LVOT diam:     2.10 cm  LV E/e' lateral: 10.2 LV SV:         88 LV SV Index:   42              2D Longitudinal LVOT Area:     3.46 cm        Strain 2D Strain GLS   -27.6 % (A4C): 2D Strain GLS   -22.5 % (A3C): 2D Strain GLS   -20.6 % (A2C): 2D Strain GLS   -23.6 % Avg:  3D Volume EF LV 3D EF:    Left ventricul ar ejection fraction by 3D volume is 59 %.  3D Volume EF: 3D EF:        59 % LV EDV:       182 ml LV ESV:       74 ml LV SV:        108 ml  RIGHT VENTRICLE RV Basal diam:  4.31 cm RV Mid diam:    3.32 cm RV S prime:     15.10 cm/s TAPSE (M-mode): 2.8 cm  LEFT ATRIUM             Index        RIGHT ATRIUM           Index LA diam:        4.70 cm 2.28 cm/m   RA Area:     26.20 cm LA Vol (A2C):   78.1 ml 37.87 ml/m  RA Volume:   89.60 ml  43.45 ml/m LA Vol  (A4C):   70.7 ml 34.28 ml/m LA Biplane Vol: 75.1 ml 36.42 ml/m AORTIC VALVE AV Area (Vmax):    1.72 cm AV Area (Vmean):   1.66 cm AV Area (VTI):     1.77 cm AV Vmax:           241.00 cm/s AV Vmean:          164.000 cm/s AV VTI:            0.494 m AV Peak Grad:      23.2 mmHg AV Mean Grad:      13.0 mmHg LVOT Vmax:         120.00 cm/s LVOT Vmean:        78.600 cm/s LVOT VTI:          0.253 m LVOT/AV VTI ratio: 0.51  AORTA Ao Root diam: 3.40 cm Ao Asc diam:  4.10 cm  MITRAL VALVE                TRICUSPID VALVE MV Area (PHT): 4.49 cm     TR Peak grad:   41.2 mmHg MV Decel Time: 169 msec     TR Vmax:        321.00 cm/s MV E velocity: 130.00 cm/s MV A velocity: 66.30 cm/s   SHUNTS MV E/A ratio:  1.96         Systemic VTI:  0.25 m Systemic Diam: 2.10 cm  Oneil Parchment MD Electronically signed by Oneil Parchment MD Signature Date/Time: 09/19/2023/4:02:59 PM    Final          ______________________________________________________________________________________________      Risk Assessment/Calculations  CHA2DS2-VASc Score = 2   This indicates a 2.2% annual risk of stroke. The patient's score is based upon: CHF History: 0 HTN History: 1 Diabetes History: 0 Stroke History: 0 Vascular Disease History: 0 Age Score: 1 Gender Score: 0            Physical Exam VS:  BP 110/66 (BP Location: Right Arm, Patient Position: Sitting, Cuff Size: Normal)   Pulse 77   Ht 5' 6 (1.676 m)   Wt 216 lb 9.6 oz (98.2 kg)   SpO2 90%   BMI 34.96 kg/m        Wt Readings from Last 3 Encounters:  11/04/23 216 lb 9.6 oz (98.2 kg)  11/01/23 210 lb 11.2 oz (95.6 kg)  10/25/23 225 lb 11.2 oz (102.4 kg)    GEN: Well nourished, well developed in no acute distress NECK: No JVD; No carotid bruits CARDIAC: RRR, no murmurs, rubs, gallops RESPIRATORY:  Clear to auscultation without rales, wheezing or rhonchi  ABDOMEN: Soft, non-tender, non-distended EXTREMITIES:  No edema; No deformity    ASSESSMENT AND PLAN  Pulmonary hypertension  - 10/2023 LVEF 55-60%, normal diastolic parameters, RVSF normal, RV mildly enlarged, moderately elevated PASP, mild MR, mild AS. Anticipate related to COPD, upcoming visit to establish with pulmonology. LE edema improved after 3 days of lasix  20mg . Persistent dyspnea.  Rx Lasix  20mg  daily for pulmonary hypertension.  If dyspnea persists, consider referral to [facility] to discuss additional therapies for PAH.   Mild AS / MR - Echo 10/2023 mild MR, mild AS. Consider repeat echo in one year, can be coordinated at follow up.   COPD - upcoming visit with pulmonology to establish care. Oes not that inhaler does not seem to be lasting 12 hours, encouraged to discuss alternative with pulmonology.   Atrial flutter / Hypercoagulable state - s/p DCCV 09/15/23. Continue Eliquis  5mg  BID. He has been advised on to hold Eliquis  prior to ERCP by his surgical team.   Cholangiocarcinoma on chemotherapy - per oncology team.   HTN - BP controlled, relatively hypotensive but asymptomatic with no lightheadedness, dizziness. Continue half tablet of benazepril -hydrochlorothiazide  20-25mg  daily. If develops symptomatic hypotension, consider transition to just benazepril .        Dispo: follow up as scheduled with Dr. Lonni  Signed, Reche GORMAN Finder, NP

## 2023-11-04 NOTE — Patient Instructions (Addendum)
 Medication Instructions:  START Furosemide  (Lasix ) 20mg  once per day  Hold Furosemide  the day of your ERCP Hold Eliquis  2 days prior to ERCP  *If you need a refill on your cardiac medications before your next appointment, please call your pharmacy*  Follow-Up: At [facility], you and your health needs are our priority.  As part of our continuing mission to provide you with exceptional heart care, our providers are all part of one team.  This team includes your primary Cardiologist (physician) and Advanced Practice Providers or APPs (Physician Assistants and Nurse Practitioners) who all work together to provide you with the care you need, when you need it.  Your next appointment:   As scheduled with Dr. Lonni  We recommend signing up for the patient portal called MyChart.  Sign up information is provided on this After Visit Summary.  MyChart is used to connect with patients for Virtual Visits (Telemedicine).  Patients are able to view lab/test results, encounter notes, upcoming appointments, etc.  Non-urgent messages can be sent to your provider as well.   To learn more about what you can do with MyChart, go to [email or web address].   Other Instructions   Your echocardiogram shows your heart is pumping well. Your pulmonary (lung) pressures were high which is leading to the shortness of breath. Furosemide  (Lasix ) will help with this.

## 2023-11-06 ENCOUNTER — Other Ambulatory Visit: Payer: Self-pay

## 2023-11-07 ENCOUNTER — Other Ambulatory Visit: Payer: Self-pay | Admitting: Nurse Practitioner

## 2023-11-07 ENCOUNTER — Other Ambulatory Visit: Payer: Self-pay

## 2023-11-07 ENCOUNTER — Ambulatory Visit (HOSPITAL_COMMUNITY)
Admission: RE | Admit: 2023-11-07 | Discharge: 2023-11-07 | Disposition: A | Discharge status: Home / Self Care | Attending: Gastroenterology | Admitting: Gastroenterology

## 2023-11-07 ENCOUNTER — Ambulatory Visit (HOSPITAL_COMMUNITY): Admitting: Certified Registered"

## 2023-11-07 ENCOUNTER — Telehealth: Payer: Self-pay

## 2023-11-07 ENCOUNTER — Encounter (HOSPITAL_COMMUNITY)
Admission: RE | Disposition: A | Discharge status: Home / Self Care | Payer: Self-pay | Source: Home / Self Care | Attending: Gastroenterology

## 2023-11-07 ENCOUNTER — Encounter (HOSPITAL_COMMUNITY): Payer: Self-pay | Admitting: Gastroenterology

## 2023-11-07 ENCOUNTER — Ambulatory Visit (HOSPITAL_COMMUNITY)

## 2023-11-07 DIAGNOSIS — Z4659 Encounter for fitting and adjustment of other gastrointestinal appliance and device: Secondary | ICD-10-CM | POA: Diagnosis not present

## 2023-11-07 DIAGNOSIS — I4892 Unspecified atrial flutter: Secondary | ICD-10-CM

## 2023-11-07 DIAGNOSIS — M1990 Unspecified osteoarthritis, unspecified site: Secondary | ICD-10-CM | POA: Diagnosis not present

## 2023-11-07 DIAGNOSIS — Z9889 Other specified postprocedural states: Secondary | ICD-10-CM

## 2023-11-07 DIAGNOSIS — Z4682 Encounter for fitting and adjustment of non-vascular catheter: Secondary | ICD-10-CM | POA: Diagnosis not present

## 2023-11-07 DIAGNOSIS — J449 Chronic obstructive pulmonary disease, unspecified: Secondary | ICD-10-CM | POA: Diagnosis not present

## 2023-11-07 DIAGNOSIS — Y828 Other medical devices associated with adverse incidents: Secondary | ICD-10-CM | POA: Diagnosis not present

## 2023-11-07 DIAGNOSIS — C221 Intrahepatic bile duct carcinoma: Secondary | ICD-10-CM | POA: Diagnosis not present

## 2023-11-07 DIAGNOSIS — K838 Other specified diseases of biliary tract: Secondary | ICD-10-CM

## 2023-11-07 DIAGNOSIS — I272 Pulmonary hypertension, unspecified: Secondary | ICD-10-CM

## 2023-11-07 DIAGNOSIS — F17200 Nicotine dependence, unspecified, uncomplicated: Secondary | ICD-10-CM | POA: Insufficient documentation

## 2023-11-07 DIAGNOSIS — R932 Abnormal findings on diagnostic imaging of liver and biliary tract: Secondary | ICD-10-CM | POA: Diagnosis not present

## 2023-11-07 DIAGNOSIS — I10 Essential (primary) hypertension: Secondary | ICD-10-CM | POA: Insufficient documentation

## 2023-11-07 DIAGNOSIS — T85528A Displacement of other gastrointestinal prosthetic devices, implants and grafts, initial encounter: Secondary | ICD-10-CM

## 2023-11-07 DIAGNOSIS — C240 Malignant neoplasm of extrahepatic bile duct: Secondary | ICD-10-CM

## 2023-11-07 DIAGNOSIS — T85590A Other mechanical complication of bile duct prosthesis, initial encounter: Secondary | ICD-10-CM | POA: Diagnosis not present

## 2023-11-07 HISTORY — PX: ERCP: SHX5425

## 2023-11-07 SURGERY — ERCP, WITH INTERVENTION IF INDICATED
Anesthesia: General

## 2023-11-07 MED ORDER — LACTATED RINGERS IV SOLN
INTRAVENOUS | Status: AC | PRN
  Administered 2023-11-07: 1000 mL via INTRAVENOUS

## 2023-11-07 MED ORDER — PROPOFOL 10 MG/ML IV BOLUS
INTRAVENOUS | Status: DC | PRN
  Administered 2023-11-07: 30 mg via INTRAVENOUS
  Administered 2023-11-07: 50 mg via INTRAVENOUS
  Administered 2023-11-07: 170 mg via INTRAVENOUS

## 2023-11-07 MED ORDER — DICLOFENAC SUPPOSITORY 100 MG
RECTAL | Status: DC | PRN
Start: 2023-11-07 — End: 2023-11-07
  Administered 2023-11-07: 100 mg via RECTAL

## 2023-11-07 MED ORDER — PHENYLEPHRINE HCL (PRESSORS) 10 MG/ML IV SOLN
INTRAVENOUS | Status: DC | PRN
  Administered 2023-11-07: 80 ug via INTRAVENOUS

## 2023-11-07 MED ORDER — DICLOFENAC SUPPOSITORY 100 MG
RECTAL | Status: AC
Start: 2023-11-07 — End: 2023-11-07
  Filled 2023-11-07: qty 1

## 2023-11-07 MED ORDER — CIPROFLOXACIN HCL 500 MG PO TABS: 500.0000 mg | ORAL_TABLET | Freq: Two times a day (BID) | ORAL | 0 refills | Status: AC

## 2023-11-07 MED ORDER — LIDOCAINE HCL (CARDIAC) PF 100 MG/5ML IV SOSY
PREFILLED_SYRINGE | INTRAVENOUS | Status: DC | PRN
  Administered 2023-11-07: 100 mg via INTRAVENOUS

## 2023-11-07 MED ORDER — ROCURONIUM BROMIDE 100 MG/10ML IV SOLN
INTRAVENOUS | Status: DC | PRN
  Administered 2023-11-07: 20 mg via INTRAVENOUS
  Administered 2023-11-07: 60 mg via INTRAVENOUS

## 2023-11-07 MED ORDER — CIPROFLOXACIN IN D5W 400 MG/200ML IV SOLN
INTRAVENOUS | Status: DC | PRN
  Administered 2023-11-07: 400 mg via INTRAVENOUS

## 2023-11-07 MED ORDER — LACTATED RINGERS IV SOLN: INTRAVENOUS | Status: DC | PRN

## 2023-11-07 MED ORDER — FENTANYL CITRATE (PF) 100 MCG/2ML IJ SOLN
INTRAMUSCULAR | Status: AC
  Filled 2023-11-07: qty 2

## 2023-11-07 MED ORDER — GLUCAGON HCL RDNA (DIAGNOSTIC) 1 MG IJ SOLR
INTRAMUSCULAR | Status: AC
  Filled 2023-11-07: qty 1

## 2023-11-07 MED ORDER — SODIUM CHLORIDE 0.9 % IV SOLN
INTRAVENOUS | Status: DC | PRN
  Administered 2023-11-07: 80 mL

## 2023-11-07 MED ORDER — CIPROFLOXACIN IN D5W 400 MG/200ML IV SOLN
INTRAVENOUS | Status: AC
  Filled 2023-11-07: qty 200

## 2023-11-07 MED ORDER — SODIUM CHLORIDE 0.9 % IV SOLN: INTRAVENOUS | Status: DC

## 2023-11-07 MED ORDER — EPHEDRINE SULFATE-NACL 50-0.9 MG/10ML-% IV SOSY
PREFILLED_SYRINGE | INTRAVENOUS | Status: DC | PRN
Start: 2023-11-07 — End: 2023-11-07
  Administered 2023-11-07: 10 mg via INTRAVENOUS

## 2023-11-07 MED ORDER — FENTANYL CITRATE (PF) 100 MCG/2ML IJ SOLN
INTRAMUSCULAR | Status: DC | PRN
  Administered 2023-11-07: 50 ug via INTRAVENOUS

## 2023-11-07 MED ORDER — ONDANSETRON HCL 4 MG/2ML IJ SOLN
INTRAMUSCULAR | Status: DC | PRN
  Administered 2023-11-07: 4 mg via INTRAVENOUS

## 2023-11-07 MED ORDER — SUGAMMADEX SODIUM 200 MG/2ML IV SOLN
INTRAVENOUS | Status: DC | PRN
  Administered 2023-11-07: 200 mg via INTRAVENOUS

## 2023-11-07 NOTE — Telephone Encounter (Signed)
 4 month ERCP recall has been entered

## 2023-11-07 NOTE — Transfer of Care (Signed)
 Immediate Anesthesia Transfer of Care Note  Patient: Robert [name]  Procedure(s) Performed: ERCP, WITH INTERVENTION IF INDICATED  Patient Location: PACU  Anesthesia Type:General  Level of Consciousness: awake and alert   Airway & Oxygen  Therapy: Patient Spontanous Breathing and Patient connected to nasal cannula oxygen   Post-op Assessment: Report given to RN and Post -op Vital signs reviewed and stable  Post vital signs: Reviewed and stable  Last Vitals:  Vitals Value Taken Time  BP 152/73 11/07/23 11:07  Temp 36.3 C 11/07/23 11:07  Pulse 72 11/07/23 11:10  Resp 18 11/07/23 11:10  SpO2 96 % 11/07/23 11:10  Vitals shown include unfiled device data.  Last Pain:  Vitals:   11/07/23 1107  TempSrc: Temporal  PainSc: 0-No pain         Complications: No notable events documented.

## 2023-11-07 NOTE — Anesthesia Postprocedure Evaluation (Signed)
 Anesthesia Post Note  Patient: Robert [name]  Procedure(s) Performed: ERCP, WITH INTERVENTION IF INDICATED     Patient location during evaluation: PACU Anesthesia Type: General Level of consciousness: awake and alert Pain management: pain level controlled Vital Signs Assessment: post-procedure vital signs reviewed and stable Respiratory status: spontaneous breathing, nonlabored ventilation and respiratory function stable Cardiovascular status: blood pressure returned to baseline Postop Assessment: no apparent nausea or vomiting Anesthetic complications: no   No notable events documented.  Last Vitals:  Vitals:   11/07/23 1110 11/07/23 1120  BP: (!) 141/60 (!) 151/74  Pulse: 72 70  Resp: 18 20  Temp:    SpO2: 96% 92%    Last Pain:  Vitals:   11/07/23 1107  TempSrc: Temporal  PainSc: 0-No pain                 Vertell Row

## 2023-11-07 NOTE — Telephone Encounter (Signed)
ON hold

## 2023-11-07 NOTE — Telephone Encounter (Signed)
-----   Message from [facility] sent at 11/07/2023 11:10 AM EDT ----- Regarding: Followup Loa Idler, This patient needs ERCP stent exchange in 4 months. Thanks. GM

## 2023-11-07 NOTE — Telephone Encounter (Signed)
 Will discuss at MD visit on 10/28

## 2023-11-07 NOTE — Anesthesia Procedure Notes (Deleted)
 Procedure Name: Intubation Date/Time: 11/07/2023 9:33 AM  Performed by: Dartha Meckel, CRNAPre-anesthesia Checklist: Patient identified, Emergency Drugs available, Suction available and Patient being monitored Patient Re-evaluated:Patient Re-evaluated prior to induction Oxygen  Delivery Method: Circle system utilized Preoxygenation: Pre-oxygenation with 100% oxygen  Induction Type: IV induction Ventilation: Mask ventilation without difficulty Tube type: Oral Number of attempts: 1 Airway Equipment and Method: Stylet and Oral airway Placement Confirmation: ETT inserted through vocal cords under direct vision, positive ETCO2 and breath sounds checked- equal and bilateral Tube secured with: Tape Dental Injury: Teeth and Oropharynx as per pre-operative assessment

## 2023-11-07 NOTE — H&P (Signed)
 GASTROENTEROLOGY PROCEDURE H&P NOTE   Primary Care Physician: Omar Cumins, FNP  HPI: Robert [name] is a 68 y.o. male who presents for ERCP for biliary stent exchange.  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   Cancer (HCC)    COPD (chronic obstructive pulmonary disease) (HCC)    Elevated hemoglobin A1c    Elevated PSA 03/16/2016   Hypertension    Personal history of colonic adenomas 12/28/2007   Vitamin D  deficiency    Past Surgical History:  Procedure Laterality Date   BILIARY BRUSHING  05/06/2023   Procedure: BRUSH BIOPSY, BILE DUCT;  Surgeon: Wilhelmenia Aloha Raddle., MD;  Location: [facility] ENDOSCOPY;  Service: Gastroenterology;;   BILIARY STENT PLACEMENT  05/06/2023   Procedure: INSERTION, STENT, BILE DUCT;  Surgeon: Wilhelmenia Aloha Raddle., MD;  Location: [facility] ENDOSCOPY;  Service: Gastroenterology;;   BIOPSY OF SKIN SUBCUTANEOUS TISSUE AND/OR MUCOUS MEMBRANE  05/06/2023   Procedure: BIOPSY, SKIN, SUBCUTANEOUS TISSUE, OR MUCOUS MEMBRANE;  Surgeon: Wilhelmenia Aloha Raddle., MD;  Location: [facility] ENDOSCOPY;  Service: Gastroenterology;;   COLONOSCOPY  2009   CG  hems, TAs   ERCP N/A 05/06/2023   Procedure: ERCP, WITH INTERVENTION IF INDICATED;  Surgeon: Wilhelmenia Aloha Raddle., MD;  Location: [facility] ENDOSCOPY;  Service: Gastroenterology;  Laterality: N/A;   ESOPHAGOGASTRODUODENOSCOPY N/A 06/16/2023   Procedure: EGD (ESOPHAGOGASTRODUODENOSCOPY);  Surgeon: Wilhelmenia Aloha Raddle., MD;  Location: [facility]OSCOPY;  Service: Gastroenterology;  Laterality: N/A;   IR IMAGING GUIDED PORT INSERTION  06/10/2023   LYMPHADENECTOMY N/A 06/28/2016   Procedure: LYMPHADENECTOMY;  Surgeon: Renda Glance, MD;  Location: [facility] ORS;  Service: Urology;  Laterality: N/A;   PANCREATIC STENT PLACEMENT  05/06/2023   Procedure: INSERTION, STENT, PANCREATIC DUCT;  Surgeon: Wilhelmenia Aloha Raddle., MD;  Location: [facility] ENDOSCOPY;  Service: Gastroenterology;;   PROSTATE  BIOPSY  03/16/2016   Surgical Pathology Gross and Microscopic Examination for Prostate Needle   ROBOT ASSISTED LAPAROSCOPIC RADICAL PROSTATECTOMY N/A 06/28/2016   Procedure: XI ROBOTIC ASSISTED LAPAROSCOPIC RADICAL PROSTATECTOMY LEVEL 2 EXCISION OF PENILE LESION;  Surgeon: Renda Glance, MD;  Location: [facility] ORS;  Service: Urology;  Laterality: N/A;   No current facility-administered medications for this encounter.   Facility-Administered Medications Ordered in Other Encounters  Medication Dose Route Frequency Provider Last Rate Last Admin   pegfilgrastim -jmdb (FULPHILA ) injection 6 mg  6 mg Subcutaneous Once Thomas, Lisa K, NP       No current facility-administered medications for this encounter.  Facility-Administered Medications Ordered in Other Encounters:    pegfilgrastim -jmdb (FULPHILA ) injection 6 mg, 6 mg, Subcutaneous, Once, Debby Olam POUR, NP Allergies  Allergen Reactions   Codeine Itching   Isovue  [Iopamidol ] Hives    Pt. Developed 1 hive after injection; Dr. Graham spoke with patient;recommends 13 hr prep in the future   Family History  Problem Relation Age of Onset   Lung cancer Mother    Heart disease Brother    Cancer Maternal Aunt        NOS   Lung disease Neg Hx    Colon cancer Neg Hx    Rectal cancer Neg Hx    Stomach cancer Neg Hx    Social History   Socioeconomic History   Marital status: Widowed    Spouse name: Not on file   Number of children: Not on file   Years of education: Not on file   Highest education level: Not on file  Occupational History   Not on  file  Tobacco Use   Smoking status: Every Day    Current packs/day: 0.50    Average packs/day: 0.5 packs/day for 40.0 years (20.0 ttl pk-yrs)    Types: Cigarettes    Passive exposure: Current (PAST too)   Smokeless tobacco: Never   Tobacco comments:    has decreased smoking significantly with goal of quitting  Vaping Use   Vaping status: Never Used  Substance and Sexual Activity   Alcohol  use: Yes    Alcohol/week: 4.0 standard drinks of alcohol    Types: 4 Cans of beer per week    Comment: occasionally   Drug use: No   Sexual activity: Yes  Other Topics Concern   Not on file  Social History Narrative   [facility] (06/29/16):   Currently works as a chartered certified accountant. Exposure primarily to aluminum and steel. No history of bird or mold exposure. No recent hot tub exposure. Does have 2 dogs.   Social Drivers of Corporate Investment Banker Strain: Not on file  Food Insecurity: No Food Insecurity (06/01/2023)   Hunger Vital Sign    Worried About Running Out of Food in the Last Year: Never true    Ran Out of Food in the Last Year: Never true  Recent Concern: Food Insecurity - Food Insecurity Present (06/01/2023)   Hunger Vital Sign    Worried About Running Out of Food in the Last Year: Never true    Ran Out of Food in the Last Year: Sometimes true  Transportation Needs: No Transportation Needs (06/01/2023)   PRAPARE - Administrator, Civil Service (Medical): No    Lack of Transportation (Non-Medical): No  Physical Activity: Not on file  Stress: Not on file  Social Connections: Socially Isolated (05/03/2023)   Social Connection and Isolation Panel    Frequency of Communication with Friends and Family: More than three times a week    Frequency of Social Gatherings with Friends and Family: Twice a week    Attends Religious Services: Never    Database Administrator or Organizations: No    Attends Banker Meetings: Never    Marital Status: Widowed  Intimate Partner Violence: Not At Risk (06/01/2023)   Humiliation, Afraid, Rape, and Kick questionnaire    Fear of Current or Ex-Partner: No    Emotionally Abused: No    Physically Abused: No    Sexually Abused: No    Physical Exam: There were no vitals filed for this visit. There is no height or weight on file to calculate BMI. GEN: NAD EYE: Sclerae anicteric ENT: MMM CV: Non-tachycardic GI: Soft,  NT/ND NEURO:  Alert & Oriented x 3  Lab Results: No results for input(s): WBC, HGB, HCT, PLT in the last 72 hours. BMET No results for input(s): NA, K, CL, CO2, GLUCOSE, BUN, CREATININE, CALCIUM  in the last 72 hours. LFT No results for input(s): PROT, ALBUMIN, AST, ALT, ALKPHOS, BILITOT, BILIDIR, IBILI in the last 72 hours. PT/INR No results for input(s): LABPROT, INR in the last 72 hours.   Impression / Plan: This is a 68 y.o.male who presents for ERCP for biliary stent exchange.  The risks of an ERCP were discussed at length, including but not limited to the risk of perforation, bleeding, abdominal pain, post-ERCP pancreatitis (while usually mild can be severe and even life threatening).   The risks and benefits of endoscopic evaluation/treatment were discussed with the patient and/or family; these include but are not limited to  the risk of perforation, infection, bleeding, missed lesions, lack of diagnosis, severe illness requiring hospitalization, as well as anesthesia and sedation related illnesses.  The patient's history has been reviewed, patient examined, no change in status, and deemed stable for procedure.  The patient and/or family is agreeable to proceed.    Aloha Finner, MD [facility] Office # [phone]

## 2023-11-07 NOTE — Discharge Instructions (Signed)

## 2023-11-07 NOTE — Anesthesia Preprocedure Evaluation (Addendum)
 Anesthesia Evaluation  Patient identified by MRN, date of birth, ID band Patient awake    Reviewed: Allergy & Precautions, NPO status , Patient's Chart, lab work & pertinent test results  History of Anesthesia Complications Negative for: history of anesthetic complications  Airway Mallampati: II  TM Distance: >3 FB Neck ROM: Full    Dental no notable dental hx.    Pulmonary COPD, Current Smoker   Pulmonary exam normal        Cardiovascular hypertension, Pt. on medications Normal cardiovascular exam     Neuro/Psych negative neurological ROS     GI/Hepatic Neg liver ROS,,,Cholangiocarcinoma   Endo/Other  negative endocrine ROS    Renal/GU negative Renal ROS     Musculoskeletal  (+) Arthritis ,    Abdominal   Peds  Hematology  (+) Blood dyscrasia (Hgb 12.5), anemia   Anesthesia Other Findings   Reproductive/Obstetrics                              Anesthesia Physical Anesthesia Plan  ASA: 3  Anesthesia Plan: General   Post-op Pain Management: Tylenol  PO (pre-op)*   Induction: Intravenous  PONV Risk Score and Plan: 2 and Treatment may vary due to age or medical condition, Ondansetron , Dexamethasone  and Midazolam   Airway Management Planned: Oral ETT  Additional Equipment: None  Intra-op Plan:   Post-operative Plan: Extubation in OR  Informed Consent: I have reviewed the patients History and Physical, chart, labs and discussed the procedure including the risks, benefits and alternatives for the proposed anesthesia with the patient or authorized representative who has indicated his/her understanding and acceptance.       Plan Discussed with: CRNA  Anesthesia Plan Comments:          Anesthesia Quick Evaluation

## 2023-11-07 NOTE — Anesthesia Procedure Notes (Signed)
 Procedure Name: Intubation Date/Time: 11/07/2023 9:33 AM  Performed by: Paul Lamarr BRAVO, MDPre-anesthesia Checklist: Patient identified, Emergency Drugs available, Suction available and Patient being monitored Patient Re-evaluated:Patient Re-evaluated prior to induction Oxygen  Delivery Method: Circle System Utilized Preoxygenation: Pre-oxygenation with 100% oxygen  Induction Type: IV induction Ventilation: Mask ventilation without difficulty and Oral airway inserted - appropriate to patient size Laryngoscope Size: Cleotilde and 2 Grade View: Grade III Tube type: Oral Tube size: 7.5 mm Number of attempts: 2 Airway Equipment and Method: Stylet and Oral airway Placement Confirmation: ETT inserted through vocal cords under direct vision, positive ETCO2 and breath sounds checked- equal and bilateral Secured at: 22 cm Tube secured with: Tape Dental Injury: Teeth and Oropharynx as per pre-operative assessment  Difficulty Due To: Difficulty was unanticipated and Difficult Airway- due to anterior larynx Comments: First attempt by medical student with Mac 3, unable to view glottic opening. Second attempt by myself with Cleotilde 2 blade, grade 2 view, anterior larynx noted, atraumatic intubation. Lawence, MD

## 2023-11-07 NOTE — Op Note (Signed)
 [facility] Patient Name: Robert [name] Procedure Date: 11/07/2023 MRN: [id number] Attending MD: Aloha Finner , MD, [id number] Date of Birth: February 14, 1955 CSN: [id number] Age: 68 Admit Type: Outpatient Procedure:                ERCP Indications:              Cholangiocarcinoma, Stent change Providers:                Aloha Finner, MD, Randall Lines, RN, Jasmine                            Petiford, Technician, Kristale Privette, CRNA Referring MD:              Medicines:                General Anesthesia, Diclofenac  100 mg rectal, Cipro                             400 mg IV Complications:            No immediate complications. Estimated Blood Loss:     Estimated blood loss was minimal. Procedure:                Pre-Anesthesia Assessment:                           - Prior to the procedure, a History and Physical                            was performed, and patient medications and                            allergies were reviewed. The patient's tolerance of                            previous anesthesia was also reviewed. The risks                            and benefits of the procedure and the sedation                            options and risks were discussed with the patient.                            All questions were answered, and informed consent                            was obtained. Prior Anticoagulants: The patient has                            taken Eliquis  (apixaban ), last dose was 2 days                            prior to procedure. ASA Grade Assessment: III - A  patient with severe systemic disease. After                            reviewing the risks and benefits, the patient was                            deemed in satisfactory condition to undergo the                            procedure.                           After obtaining informed consent, the scope was                            passed under direct  vision. Throughout the                            procedure, the patient's blood pressure, pulse, and                            oxygen  saturations were monitored continuously. The                            TJF-Q190V ([id number]) Olympus duodenoscope was                            introduced through the mouth, and used to inject                            contrast into and used to inject contrast into the                            bile duct. The ERCP was accomplished without                            difficulty. The patient tolerated the procedure. Scope In: Scope Out: Findings:      Two biliary stents were visible on the scout film.      The esophagus was successfully intubated under direct vision without       detailed examination of the pharynx, larynx, and associated structures,       and upper GI tract. A biliary sphincterotomy had been performed. The       sphincterotomy appeared open. Two plastic biliary stents originating in       the biliary tree were emerging from the major papilla. The stents were       partially occluded.      Two separate 0.035 inch x 260 cm straight Hydra Jagwires were passed       into the biliary tree through each of the biliary stents. Subsequently       the two stents were removed from the biliary tree using a snare.      The bile duct was deeply cannulated with the short-nosed traction       sphincterotome thereafter. Contrast was injected. I personally       interpreted  the bile duct images. Ductal flow of contrast was adequate.       Image quality was adequate. Contrast extended to the hepatic ducts.       Opacification of the entire biliary tree except for the cystic duct and       gallbladder was successful. The common hepatic duct contained a       localized irregularity/narrowing (however improved significantly from       prior). The lower third of the main bile duct and middle third of the       main bile duct were mildly dilated. The largest  diameter was 8 mm. To       discover objects, the biliary tree was swept with a retrieval balloon.       Sludge was swept from the duct. An occlusion cholangiogram was performed       that showed that although previously I had thought we were in the left       and right systems I was actually only in the left system. I subsequently       attempt to access the right system, but was not able to gain a wire into       place. Since he really had only been drained in 1 location for the last       few months, I decided to just place 1 stent today. One 10 Fr by 9 cm       transpapillary plastic biliary stent with a single external flap and a       single internal flap was placed into the left hepatic duct. The stent       was in good position and traverse the CHD region.      A pancreatogram was not performed.      The duodenoscope was withdrawn from the patient. Impression:               - Prior biliary sphincterotomy appeared open.                           - Two partially occluded stents from the biliary                            tree were seen in the major papilla. These were                            removed. Previously I though we were in the                            Left/Right systems, but I learned today that I                            really was only in the left system.                           - The middle third of the main bile duct and lower                            third of the main bile duct were mildly dilated (to  8 mm).                           - An irregularity/narrowing was found in the common                            hepatic duct (though improved from prior).                           - The biliary tree was swept and sludge was found.                           - One plastic biliary stent was placed into the                            left hepatic duct to traverse the narrowing. Moderate Sedation:      Not Applicable - Patient had care per  Anesthesia. Recommendation:           - The patient will be observed post-procedure,                            until all discharge criteria are met.                           - Discharge patient to home.                           - Patient has a contact number available for                            emergencies. The signs and symptoms of potential                            delayed complications were discussed with the                            patient. Return to normal activities tomorrow.                            Written discharge instructions were provided to the                            patient.                           - Check liver enzymes (AST, ALT, alkaline                            phosphatase, bilirubin).                           - Observe patient's clinical course.                           - Ciprofloxacin  500 mg BID x 5-days to decrease  risk of infectious complications.                           - Observe patient's clinical course.                           - Repeat ERCP in 4-6 months to exchange stent (and                            if he is doing well with single stent in place,                            then I think we can likely exchange to a UCSEMS to                            traverse the region (which still having some in the                            left hepatic duct without causing significant                            jailing. We will need to see where things stand.                           - Will update his Oncologist and Surgical                            Oncologist to these findings.                           - Watch for pancreatitis, bleeding, perforation,                            and cholangitis.                           - The findings and recommendations were discussed                            with the patient.                           - The findings and recommendations were discussed                             with the patient's family. Procedure Code(s):        --- Professional ---                           [phone], Endoscopic retrograde                            cholangiopancreatography (ERCP); with removal and  exchange of stent(s), biliary or pancreatic duct,                            including pre- and post-dilation and guide wire                            passage, when performed, including sphincterotomy,                            when performed, each stent exchanged                           43264, Endoscopic retrograde                            cholangiopancreatography (ERCP); with removal of                            calculi/debris from biliary/pancreatic duct(s)                           74328, 26, Endoscopic catheterization of the                            biliary ductal system, radiological supervision and                            interpretation Diagnosis Code(s):        --- Professional ---                           U14.409J, Other mechanical complication of bile                            duct prosthesis, initial encounter                           Z46.59, Encounter for fitting and adjustment of                            other gastrointestinal appliance and device                           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 All rights reserved. The codes documented in this report are preliminary and upon coder review may  be revised to meet current compliance requirements. Aloha Finner, MD 11/07/2023 11:06:53 AM Number of Addenda: 0

## 2023-11-08 ENCOUNTER — Inpatient Hospital Stay (HOSPITAL_BASED_OUTPATIENT_CLINIC_OR_DEPARTMENT_OTHER): Admitting: Nurse Practitioner

## 2023-11-08 ENCOUNTER — Inpatient Hospital Stay

## 2023-11-08 ENCOUNTER — Other Ambulatory Visit (HOSPITAL_BASED_OUTPATIENT_CLINIC_OR_DEPARTMENT_OTHER): Payer: Self-pay

## 2023-11-08 ENCOUNTER — Encounter: Payer: Self-pay | Admitting: Nurse Practitioner

## 2023-11-08 VITALS — BP 131/66 | HR 82 | Temp 97.8°F | Resp 18 | Ht 66.0 in | Wt 211.0 lb

## 2023-11-08 DIAGNOSIS — C240 Malignant neoplasm of extrahepatic bile duct: Secondary | ICD-10-CM | POA: Diagnosis not present

## 2023-11-08 DIAGNOSIS — C221 Intrahepatic bile duct carcinoma: Secondary | ICD-10-CM | POA: Diagnosis not present

## 2023-11-08 LAB — CBC WITH DIFFERENTIAL (CANCER CENTER ONLY)
Abs Immature Granulocytes: 0.01 K/uL (ref 0.00–0.07)
Basophils Absolute: 0 K/uL (ref 0.0–0.1)
Basophils Relative: 0 %
Eosinophils Absolute: 0 K/uL (ref 0.0–0.5)
Eosinophils Relative: 1 %
HCT: 34.3 % — ABNORMAL LOW (ref 39.0–52.0)
Hemoglobin: 11.3 g/dL — ABNORMAL LOW (ref 13.0–17.0)
Immature Granulocytes: 0 %
Lymphocytes Relative: 23 %
Lymphs Abs: 1.2 K/uL (ref 0.7–4.0)
MCH: 34.9 pg — ABNORMAL HIGH (ref 26.0–34.0)
MCHC: 32.9 g/dL (ref 30.0–36.0)
MCV: 105.9 fL — ABNORMAL HIGH (ref 80.0–100.0)
Monocytes Absolute: 0.2 K/uL (ref 0.1–1.0)
Monocytes Relative: 4 %
Neutro Abs: 3.7 K/uL (ref 1.7–7.7)
Neutrophils Relative %: 72 %
Platelet Count: 87 K/uL — ABNORMAL LOW (ref 150–400)
RBC: 3.24 MIL/uL — ABNORMAL LOW (ref 4.22–5.81)
RDW: 14.7 % (ref 11.5–15.5)
WBC Count: 5.2 K/uL (ref 4.0–10.5)
nRBC: 0 % (ref 0.0–0.2)

## 2023-11-08 LAB — CMP (CANCER CENTER ONLY)
ALT: 59 U/L — ABNORMAL HIGH (ref 0–44)
AST: 37 U/L (ref 15–41)
Albumin: 3.8 g/dL (ref 3.5–5.0)
Alkaline Phosphatase: 79 U/L (ref 38–126)
Anion gap: 7 (ref 5–15)
BUN: 20 mg/dL (ref 8–23)
CO2: 32 mmol/L (ref 22–32)
Calcium: 9.7 mg/dL (ref 8.9–10.3)
Chloride: 95 mmol/L — ABNORMAL LOW (ref 98–111)
Creatinine: 0.7 mg/dL (ref 0.61–1.24)
GFR, Estimated: >60 mL/min (ref >60)
Glucose, Bld: 138 mg/dL — ABNORMAL HIGH (ref 70–99)
Potassium: 3.7 mmol/L (ref 3.5–5.1)
Sodium: 134 mmol/L — ABNORMAL LOW (ref 135–145)
Total Bilirubin: 0.4 mg/dL (ref 0.0–1.2)
Total Protein: 6.5 g/dL (ref 6.5–8.1)

## 2023-11-08 LAB — MAGNESIUM: Magnesium: 1.6 mg/dL — ABNORMAL LOW (ref 1.7–2.4)

## 2023-11-08 MED ORDER — APIXABAN 5 MG PO TABS
5.0000 mg | ORAL_TABLET | Freq: Two times a day (BID) | ORAL | 2 refills | Status: AC
  Filled 2023-11-08: qty 60, 30d supply, fill #0
  Filled 2023-12-21: qty 60, 30d supply, fill #1
  Filled 2024-01-29: qty 60, 30d supply, fill #2

## 2023-11-08 NOTE — Progress Notes (Addendum)
 Patient seen by [name] NP today  Vitals are within treatment parameters:Yes   Labs are within treatment parameters: No (Please specify and give further instructions.) Magnesium  1.6 Platelets 87  Treatment plan has been signed: Yes   Per physician team, Patient is ready for treatment. Please note the following modifications: Gemcitabine  will be held, proceed with cisplatin  alone

## 2023-11-08 NOTE — Progress Notes (Signed)
 [facility] OFFICE PROGRESS NOTE   Diagnosis: Cholangiocarcinoma.  INTERVAL HISTORY:   [name] returns as scheduled.  He completed cycle 6-day 1 gemcitabine /cisplatin /Durvalumab  11/02/2023.  Gemcitabine  was dose reduced due to thrombocytopenia.  He underwent an ERCP with stent change 11/07/2023.  He began Lasix  following his visit last week.  He saw cardiology 11/04/2023.  He is feeling much better.  He notes improvement in breathing and leg swelling.  He denies nausea/vomiting.  No mouth sores.  No diarrhea.  No rash.  No fever after treatment.  He denies neuropathy symptoms.  No tinnitus.  No hearing loss.  Objective:  Vital signs in last 24 hours:  Blood pressure 131/66, pulse 82, temperature 97.8 F (36.6 C), resp. rate 18, height 5' 6 (1.676 m), weight 211 lb (95.7 kg), SpO2 100%.    HEENT: Linear erythema right posterior palate.  No thrush or ulcers. Resp: Faint expiratory wheezes.  No respiratory distress. Cardio: Regular rate and rhythm. GI: No hepatosplenomegaly.  Nontender. Vascular: Trace lower leg edema bilaterally. Skin: No rash. Port-A-Cath without erythema.  Lab Results:  Lab Results  Component Value Date   WBC 5.2 11/08/2023   HGB 11.3 (L) 11/08/2023   HCT 34.3 (L) 11/08/2023   MCV 105.9 (H) 11/08/2023   PLT 87 (L) 11/08/2023   NEUTROABS 3.7 11/08/2023    Imaging:  DG ERCP Result Date: 11/08/2023 CLINICAL DATA:  Cholangiogram, stent change. EXAM: ERCP TECHNIQUE: Multiple spot images obtained with the fluoroscopic device and submitted for interpretation post-procedure. FLUOROSCOPY: Radiation Exposure Index (as provided by the fluoroscopic device): 84.56 mGy Kerma COMPARISON:  CT 07/20/2023 and ERCP 05/06/2023 FINDINGS: Two biliary stents were present at the beginning of the procedure. Biliary stents were removed. Wire was advanced into the intrahepatic biliary system. Retrograde cholangiogram images were obtained. Nonspecific filling  defects in the common bile duct could represent sludge. A single biliary stent was placed. IMPRESSION: 1. Removal of the old biliary stents. 2. Placement of a new biliary stent. These images were submitted for radiologic interpretation only. Please see the procedural report. Electronically Signed   By: Robert [name] M.D.   On: 11/08/2023 12:09   DG C-Arm 1-60 Min-No Report Result Date: 11/07/2023 Fluoroscopy was utilized by the requesting physician.  No radiographic interpretation.    Medications: I have reviewed the patient's current medications.  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 with dilation of the left and right biliary systems, left and right hepatic stents and temporary pancreatic stent placed, stricture brushing-adenocarcinoma; HER2 IHC result equivocal (2+), FISH results indeterminant; Tempus-microsatellite stable, tumor mutation burden less than 10, ERBB2 expression, BRCA2 mutated CT chest 05/06/2023: No evidence of metastatic disease Elevated CA 19-9 on 05/03/2023 CT abdomen 05/27/2023 (due), decreased Intermatic biliary duct dilation, status post stenting of common bile duct, persistent soft tissue at the hepatic hilum, pancreatic duct stent in place Upper endoscopy 06/16/2023-gastritis.  Duodenitis.  Plastic biliary and pancreatic stents noted at the major papilla.  Pancreatic stent removed. Cycle 1 gemcitabine /cisplatin /Durvalumab  06/20/2023, day 8 06/28/2023 (Gemcitabine  dose reduced due to thrombocytopenia), Fulphila  Cycle 2 gemcitabine /cisplatin /Durvalumab  07/14/2023, day 8 07/21/2023,  Fulphila  Cycle 3 gemcitabine /cisplatin /durvalumab  08/09/2023 (Gem dose reduced C3D8 for thrombocytopenia) Cycle 4 Day 1 gem/cisplatin /durvalumab   08/31/23, gem dose reduced; gemcitabine /cisplatin  09/08/2023 09/23/2023 CT abdomen: No change in the ill-defined soft tissue surrounding the common bile duct, stable periportal and retroperitoneal lymph nodes, biliary stents in place with decreased intrahepatic duct dilation Cycle 5-day 1 gemcitabine /cisplatin /durvalumab  10/05/2023, day 8 10/12/2023 Cycle 6-day 1 gemcitabine /cisplatin /durvalumab  11/02/2023 (gemcitabine  further dose reduced); day 8 11/09/2023 Gemcitabine  held due to thrombocytopenia Biliary obstruction secondary #1, status post placement of right and left hepatic duct stents 05/06/2023 ERCP 11/07/2023-prior biliary sphincterotomy appeared open.  2 partially occluded stents from the biliary tree were seen in the major papilla, removed.  Middle third of the main bile duct and lower third of the main bile duct were mildly dilated.  Irregularity/narrowing found in the common hepatic duct (improved from prior).  1 plastic biliary stent was placed into the left hepatic duct. Prostate cancer, status post prostatectomy in 2018,pT3a,pN0, Gleason 7 COPD Hypertension Hyperlipidemia Tobacco use BRCA2 gene mutation-referred to genetics 07/13/2023 Right lower quadrant pain/tenderness-referred for CTs 07/20/2023 New onset atrial flutter 08/15/2023 - on Eliquis  Thrombocytopenia secondary to chemotherapy  Disposition: [name] appears stable.  He is completing cycle 6 gemcitabine /cisplatin /Durvalumab .  He is scheduled for the day 8 treatment tomorrow.  He has mild thrombocytopenia.  Gemcitabine  will be held, proceed with cisplatin  alone.  He understands to contact the office with bleeding.  He will return for follow-up and cycle 7 gemcitabine /cisplatin /Durvalumab  in 2 weeks.  We are available to see him sooner if needed.    [name] ANP/GNP-BC   11/08/2023   2:15 PM

## 2023-11-09 ENCOUNTER — Encounter (HOSPITAL_COMMUNITY): Payer: Self-pay | Admitting: Gastroenterology

## 2023-11-09 ENCOUNTER — Inpatient Hospital Stay

## 2023-11-09 VITALS — BP 144/74 | HR 61 | Temp 98.0°F | Resp 18

## 2023-11-09 DIAGNOSIS — C240 Malignant neoplasm of extrahepatic bile duct: Secondary | ICD-10-CM

## 2023-11-09 DIAGNOSIS — Z7901 Long term (current) use of anticoagulants: Secondary | ICD-10-CM | POA: Diagnosis not present

## 2023-11-09 DIAGNOSIS — C221 Intrahepatic bile duct carcinoma: Secondary | ICD-10-CM | POA: Diagnosis not present

## 2023-11-09 DIAGNOSIS — E785 Hyperlipidemia, unspecified: Secondary | ICD-10-CM | POA: Diagnosis not present

## 2023-11-09 DIAGNOSIS — Z8546 Personal history of malignant neoplasm of prostate: Secondary | ICD-10-CM | POA: Diagnosis not present

## 2023-11-09 DIAGNOSIS — T451X5A Adverse effect of antineoplastic and immunosuppressive drugs, initial encounter: Secondary | ICD-10-CM | POA: Diagnosis not present

## 2023-11-09 DIAGNOSIS — Z79899 Other long term (current) drug therapy: Secondary | ICD-10-CM | POA: Diagnosis not present

## 2023-11-09 DIAGNOSIS — J449 Chronic obstructive pulmonary disease, unspecified: Secondary | ICD-10-CM | POA: Diagnosis not present

## 2023-11-09 DIAGNOSIS — Z720 Tobacco use: Secondary | ICD-10-CM | POA: Diagnosis not present

## 2023-11-09 DIAGNOSIS — Z5111 Encounter for antineoplastic chemotherapy: Secondary | ICD-10-CM | POA: Diagnosis not present

## 2023-11-09 DIAGNOSIS — I10 Essential (primary) hypertension: Secondary | ICD-10-CM | POA: Diagnosis not present

## 2023-11-09 DIAGNOSIS — D6959 Other secondary thrombocytopenia: Secondary | ICD-10-CM | POA: Diagnosis not present

## 2023-11-09 LAB — CANCER ANTIGEN 19-9: CA 19-9: 59 U/mL — ABNORMAL HIGH (ref 0–35)

## 2023-11-09 MED ORDER — DEXAMETHASONE SOD PHOSPHATE PF 10 MG/ML IJ SOLN
10.0000 mg | Freq: Once | INTRAMUSCULAR | Status: AC
  Administered 2023-11-09: 10 mg via INTRAVENOUS

## 2023-11-09 MED ORDER — SODIUM CHLORIDE 0.9 % IV SOLN
25.0000 mg/m2 | Freq: Once | INTRAVENOUS | Status: AC
  Administered 2023-11-09: 50 mg via INTRAVENOUS
  Filled 2023-11-09: qty 50

## 2023-11-09 MED ORDER — MAGNESIUM SULFATE 2 GM/50ML IV SOLN
2.0000 g | Freq: Once | INTRAVENOUS | Status: AC
  Administered 2023-11-09: 2 g via INTRAVENOUS
  Filled 2023-11-09: qty 50

## 2023-11-09 MED ORDER — POTASSIUM CHLORIDE IN NACL 20-0.9 MEQ/L-% IV SOLN
Freq: Once | INTRAVENOUS | Status: AC
  Filled 2023-11-09: qty 1000

## 2023-11-09 MED ORDER — SODIUM CHLORIDE 0.9 % IV SOLN
150.0000 mg | Freq: Once | INTRAVENOUS | Status: AC
  Administered 2023-11-09: 150 mg via INTRAVENOUS
  Filled 2023-11-09: qty 150

## 2023-11-09 MED ORDER — SODIUM CHLORIDE 0.9 % IV SOLN: INTRAVENOUS | Status: DC

## 2023-11-09 MED ORDER — PALONOSETRON HCL INJECTION 0.25 MG/5ML
0.2500 mg | Freq: Once | INTRAVENOUS | Status: AC
  Administered 2023-11-09: 0.25 mg via INTRAVENOUS
  Filled 2023-11-09: qty 5

## 2023-11-09 NOTE — Patient Instructions (Signed)
 [facility] - A DEPT OF [facility]  Discharge Instructions: Thank you for choosing [facility] to provide your oncology and hematology care.   If you have a lab appointment with the Cancer Center, please go directly to the Cancer Center and check in at the registration area.   Wear comfortable clothing and clothing appropriate for easy access to any Portacath or PICC line.   We strive to give you quality time with your provider. You may need to reschedule your appointment if you arrive late (15 or more minutes).  Arriving late affects you and other patients whose appointments are after yours.  Also, if you miss three or more appointments without notifying the office, you may be dismissed from the clinic at the provider's discretion.      For prescription refill requests, have your pharmacy contact our office and allow 72 hours for refills to be completed.    Today you received the following chemotherapy and/or immunotherapy agents: cisplatin       To help prevent nausea and vomiting after your treatment, we encourage you to take your nausea medication as directed.  BELOW ARE SYMPTOMS THAT SHOULD BE REPORTED IMMEDIATELY: *FEVER GREATER THAN 100.4 F (38 C) OR HIGHER *CHILLS OR SWEATING *NAUSEA AND VOMITING THAT IS NOT CONTROLLED WITH YOUR NAUSEA MEDICATION *UNUSUAL SHORTNESS OF BREATH *UNUSUAL BRUISING OR BLEEDING *URINARY PROBLEMS (pain or burning when urinating, or frequent urination) *BOWEL PROBLEMS (unusual diarrhea, constipation, pain near the anus) TENDERNESS IN MOUTH AND THROAT WITH OR WITHOUT PRESENCE OF ULCERS (sore throat, sores in mouth, or a toothache) UNUSUAL RASH, SWELLING OR PAIN  UNUSUAL VAGINAL DISCHARGE OR ITCHING   Items with * indicate a potential emergency and should be followed up as soon as possible or go to the Emergency Department if any problems should occur.  Please show the CHEMOTHERAPY ALERT CARD or IMMUNOTHERAPY  ALERT CARD at check-in to the Emergency Department and triage nurse.  Should you have questions after your visit or need to cancel or reschedule your appointment, please contact [facility] - A DEPT OF [facility]  Dept: [phone]  and follow the prompts.  Office hours are 8:00 a.m. to 4:30 p.m. Monday - Friday. Please note that voicemails left after 4:00 p.m. may not be returned until the following business day.  We are closed weekends and major holidays. You have access to a nurse at all times for urgent questions. Please call the main number to the clinic Dept: [phone] and follow the prompts.   For any non-urgent questions, you may also contact your provider using MyChart. We now offer e-Visits for anyone 53 and older to request care online for non-urgent symptoms. For details visit [email or web address].   Also download the MyChart app! Go to the app store, search MyChart, open the app, select [facility], and log in with your MyChart username and password.

## 2023-11-11 ENCOUNTER — Inpatient Hospital Stay

## 2023-11-11 ENCOUNTER — Other Ambulatory Visit: Payer: Self-pay

## 2023-11-11 ENCOUNTER — Other Ambulatory Visit (HOSPITAL_BASED_OUTPATIENT_CLINIC_OR_DEPARTMENT_OTHER): Payer: Self-pay

## 2023-11-11 VITALS — BP 126/71 | HR 80 | Temp 97.6°F | Resp 18

## 2023-11-11 DIAGNOSIS — C240 Malignant neoplasm of extrahepatic bile duct: Secondary | ICD-10-CM

## 2023-11-11 DIAGNOSIS — C221 Intrahepatic bile duct carcinoma: Secondary | ICD-10-CM | POA: Diagnosis not present

## 2023-11-11 MED ORDER — PEGFILGRASTIM-JMDB 6 MG/0.6ML ~~LOC~~ SOSY
6.0000 mg | PREFILLED_SYRINGE | Freq: Once | SUBCUTANEOUS | Status: AC
  Administered 2023-11-11: 6 mg via SUBCUTANEOUS
  Filled 2023-11-11: qty 0.6

## 2023-11-11 NOTE — Patient Instructions (Signed)
 [facility] - A DEPT OF [place]  Discharge Instructions: Thank you for choosing [facility] to provide your oncology and hematology care.   If you have a lab appointment with the Cancer Center, please go directly to the Cancer Center and check in at the registration area.   Wear comfortable clothing and clothing appropriate for easy access to any Portacath or PICC line.   We strive to give you quality time with your provider. You may need to reschedule your appointment if you arrive late (15 or more minutes).  Arriving late affects you and other patients whose appointments are after yours.  Also, if you miss three or more appointments without notifying the office, you may be dismissed from the clinic at the provider's discretion.      For prescription refill requests, have your pharmacy contact our office and allow 72 hours for refills to be completed.    Today you received the following chemotherapy and/or immunotherapy agents: fulphila .      To help prevent nausea and vomiting after your treatment, we encourage you to take your nausea medication as directed.  BELOW ARE SYMPTOMS THAT SHOULD BE REPORTED IMMEDIATELY: *FEVER GREATER THAN 100.4 F (38 C) OR HIGHER *CHILLS OR SWEATING *NAUSEA AND VOMITING THAT IS NOT CONTROLLED WITH YOUR NAUSEA MEDICATION *UNUSUAL SHORTNESS OF BREATH *UNUSUAL BRUISING OR BLEEDING *URINARY PROBLEMS (pain or burning when urinating, or frequent urination) *BOWEL PROBLEMS (unusual diarrhea, constipation, pain near the anus) TENDERNESS IN MOUTH AND THROAT WITH OR WITHOUT PRESENCE OF ULCERS (sore throat, sores in mouth, or a toothache) UNUSUAL RASH, SWELLING OR PAIN  UNUSUAL VAGINAL DISCHARGE OR ITCHING   Items with * indicate a potential emergency and should be followed up as soon as possible or go to the Emergency Department if any problems should occur.  Please show the CHEMOTHERAPY ALERT CARD or IMMUNOTHERAPY  ALERT CARD at check-in to the Emergency Department and triage nurse.  Should you have questions after your visit or need to cancel or reschedule your appointment, please contact [facility] - [facility] DEPT [facility]  Dept: [phone]  and follow the prompts.  Office hours are 8:00 a.m. to 4:30 p.m. Monday - Friday. Please note that voicemails left after 4:00 p.m. may not be returned until the following business day.  We are closed weekends and major holidays. You have access to a nurse at all times for urgent questions. Please call the main number to the clinic Dept: [phone] and follow the prompts.   For any non-urgent questions, you may also contact your provider using MyChart. We now offer e-Visits for anyone 66 and older to request care online for non-urgent symptoms. For details visit [email or web address].   Also download the MyChart app! Go to the app store, search MyChart, open the app, select , and log in with your MyChart username and password.

## 2023-11-12 ENCOUNTER — Other Ambulatory Visit: Payer: Self-pay

## 2023-11-15 ENCOUNTER — Other Ambulatory Visit

## 2023-11-15 ENCOUNTER — Ambulatory Visit: Admitting: Nurse Practitioner

## 2023-11-16 ENCOUNTER — Other Ambulatory Visit: Payer: Self-pay | Admitting: Oncology

## 2023-11-16 ENCOUNTER — Ambulatory Visit

## 2023-11-18 DIAGNOSIS — A419 Sepsis, unspecified organism: Secondary | ICD-10-CM | POA: Diagnosis not present

## 2023-11-18 DIAGNOSIS — J189 Pneumonia, unspecified organism: Secondary | ICD-10-CM | POA: Diagnosis not present

## 2023-11-22 ENCOUNTER — Inpatient Hospital Stay

## 2023-11-22 ENCOUNTER — Inpatient Hospital Stay: Attending: Oncology | Admitting: Oncology

## 2023-11-22 ENCOUNTER — Other Ambulatory Visit: Payer: Self-pay | Admitting: *Deleted

## 2023-11-22 VITALS — BP 134/60 | HR 68 | Temp 98.0°F | Resp 18 | Ht 66.0 in | Wt 215.0 lb

## 2023-11-22 DIAGNOSIS — F1090 Alcohol use, unspecified, uncomplicated: Secondary | ICD-10-CM | POA: Insufficient documentation

## 2023-11-22 DIAGNOSIS — I4892 Unspecified atrial flutter: Secondary | ICD-10-CM | POA: Diagnosis not present

## 2023-11-22 DIAGNOSIS — I700 Atherosclerosis of aorta: Secondary | ICD-10-CM | POA: Diagnosis not present

## 2023-11-22 DIAGNOSIS — J441 Chronic obstructive pulmonary disease with (acute) exacerbation: Secondary | ICD-10-CM | POA: Diagnosis not present

## 2023-11-22 DIAGNOSIS — D696 Thrombocytopenia, unspecified: Secondary | ICD-10-CM | POA: Diagnosis not present

## 2023-11-22 DIAGNOSIS — E785 Hyperlipidemia, unspecified: Secondary | ICD-10-CM | POA: Insufficient documentation

## 2023-11-22 DIAGNOSIS — Z23 Encounter for immunization: Secondary | ICD-10-CM

## 2023-11-22 DIAGNOSIS — K449 Diaphragmatic hernia without obstruction or gangrene: Secondary | ICD-10-CM | POA: Diagnosis not present

## 2023-11-22 DIAGNOSIS — C221 Intrahepatic bile duct carcinoma: Secondary | ICD-10-CM | POA: Insufficient documentation

## 2023-11-22 DIAGNOSIS — F17210 Nicotine dependence, cigarettes, uncomplicated: Secondary | ICD-10-CM | POA: Insufficient documentation

## 2023-11-22 DIAGNOSIS — I10 Essential (primary) hypertension: Secondary | ICD-10-CM | POA: Diagnosis not present

## 2023-11-22 DIAGNOSIS — K5730 Diverticulosis of large intestine without perforation or abscess without bleeding: Secondary | ICD-10-CM | POA: Diagnosis not present

## 2023-11-22 DIAGNOSIS — T451X5A Adverse effect of antineoplastic and immunosuppressive drugs, initial encounter: Secondary | ICD-10-CM | POA: Insufficient documentation

## 2023-11-22 DIAGNOSIS — E871 Hypo-osmolality and hyponatremia: Secondary | ICD-10-CM | POA: Insufficient documentation

## 2023-11-22 DIAGNOSIS — Z6834 Body mass index (BMI) 34.0-34.9, adult: Secondary | ICD-10-CM | POA: Diagnosis not present

## 2023-11-22 DIAGNOSIS — C240 Malignant neoplasm of extrahepatic bile duct: Secondary | ICD-10-CM

## 2023-11-22 DIAGNOSIS — E782 Mixed hyperlipidemia: Secondary | ICD-10-CM | POA: Diagnosis not present

## 2023-11-22 DIAGNOSIS — D6959 Other secondary thrombocytopenia: Secondary | ICD-10-CM | POA: Insufficient documentation

## 2023-11-22 DIAGNOSIS — Z5111 Encounter for antineoplastic chemotherapy: Secondary | ICD-10-CM | POA: Diagnosis not present

## 2023-11-22 DIAGNOSIS — Z79899 Other long term (current) drug therapy: Secondary | ICD-10-CM | POA: Diagnosis not present

## 2023-11-22 DIAGNOSIS — F17220 Nicotine dependence, chewing tobacco, uncomplicated: Secondary | ICD-10-CM | POA: Insufficient documentation

## 2023-11-22 DIAGNOSIS — C61 Malignant neoplasm of prostate: Secondary | ICD-10-CM | POA: Insufficient documentation

## 2023-11-22 DIAGNOSIS — J449 Chronic obstructive pulmonary disease, unspecified: Secondary | ICD-10-CM | POA: Insufficient documentation

## 2023-11-22 DIAGNOSIS — E669 Obesity, unspecified: Secondary | ICD-10-CM | POA: Insufficient documentation

## 2023-11-22 DIAGNOSIS — Z8546 Personal history of malignant neoplasm of prostate: Secondary | ICD-10-CM | POA: Diagnosis not present

## 2023-11-22 LAB — CBC WITH DIFFERENTIAL (CANCER CENTER ONLY)
Abs Immature Granulocytes: 0.11 K/uL — ABNORMAL HIGH (ref 0.00–0.07)
Basophils Absolute: 0 K/uL (ref 0.0–0.1)
Basophils Relative: 0 %
Eosinophils Absolute: 0.1 K/uL (ref 0.0–0.5)
Eosinophils Relative: 1 %
HCT: 34.6 % — ABNORMAL LOW (ref 39.0–52.0)
Hemoglobin: 11.4 g/dL — ABNORMAL LOW (ref 13.0–17.0)
Immature Granulocytes: 1 %
Lymphocytes Relative: 14 %
Lymphs Abs: 1.8 K/uL (ref 0.7–4.0)
MCH: 34.8 pg — ABNORMAL HIGH (ref 26.0–34.0)
MCHC: 32.9 g/dL (ref 30.0–36.0)
MCV: 105.5 fL — ABNORMAL HIGH (ref 80.0–100.0)
Monocytes Absolute: 1 K/uL (ref 0.1–1.0)
Monocytes Relative: 8 %
Neutro Abs: 9.3 K/uL — ABNORMAL HIGH (ref 1.7–7.7)
Neutrophils Relative %: 76 %
Platelet Count: 180 K/uL (ref 150–400)
RBC: 3.28 MIL/uL — ABNORMAL LOW (ref 4.22–5.81)
RDW: 15.9 % — ABNORMAL HIGH (ref 11.5–15.5)
WBC Count: 12.4 K/uL — ABNORMAL HIGH (ref 4.0–10.5)
nRBC: 0 % (ref 0.0–0.2)

## 2023-11-22 LAB — CMP (CANCER CENTER ONLY)
ALT: 15 U/L (ref 0–44)
AST: 21 U/L (ref 15–41)
Albumin: 4.2 g/dL (ref 3.5–5.0)
Alkaline Phosphatase: 126 U/L (ref 38–126)
Anion gap: 8 (ref 5–15)
BUN: 17 mg/dL (ref 8–23)
CO2: 30 mmol/L (ref 22–32)
Calcium: 9.8 mg/dL (ref 8.9–10.3)
Chloride: 98 mmol/L (ref 98–111)
Creatinine: 0.72 mg/dL (ref 0.61–1.24)
GFR, Estimated: >60 mL/min (ref >60)
Glucose, Bld: 103 mg/dL — ABNORMAL HIGH (ref 70–99)
Potassium: 4 mmol/L (ref 3.5–5.1)
Sodium: 136 mmol/L (ref 135–145)
Total Bilirubin: 0.3 mg/dL (ref 0.0–1.2)
Total Protein: 6.8 g/dL (ref 6.5–8.1)

## 2023-11-22 LAB — MAGNESIUM: Magnesium: 1.9 mg/dL (ref 1.7–2.4)

## 2023-11-22 NOTE — Progress Notes (Signed)
 Patient seen by Dr. Arley Hof today  Vitals are within treatment parameters:Yes   Labs are within treatment parameters: Yes   Treatment plan has been signed: Yes   Per physician team, Patient is ready for treatment and there are NO modifications to the treatment plan.OK to treat on 11/23/23 Requesting flu shot on treatment day

## 2023-11-22 NOTE — Progress Notes (Signed)
 Patients port flushed without difficulty.  Good blood return noted with no bruising or swelling noted at site. Gauze dressing applied.  Patient has a appointment with Dr Cloretta today.

## 2023-11-22 NOTE — Progress Notes (Signed)
 Robert [name] returns as scheduled.  He completed day 8 of cycle 6 chemotherapy 11/09/2023.  Gemcitabine  was held due to thrombocytopenia.  No nausea/vomiting, rash, diarrhea, or neuropathy symptoms.  He feels well.  Good appetite.  Objective:  Vital signs in last 24 hours:  Blood pressure 134/60, pulse 68, temperature 98 F (36.7 C), temperature source Temporal, resp. rate 18, height 5' 6 (1.676 m), weight 215 lb (97.5 kg), SpO2 98%.    HEENT: No thrush or ulcers Resp: Lungs with distant breath sounds and bilateral expiratory wheeze, no respiratory distress Cardio: Regular rate and rhythm GI: No hepatosplenomegaly, nontender, no mass, no apparent ascites Vascular: No leg edema  Portacath/PICC-without erythema  Lab Results:  Lab Results  Component Value Date   WBC 12.4 (H) 11/22/2023   HGB 11.4 (L) 11/22/2023   HCT 34.6 (L) 11/22/2023   MCV 105.5 (H) 11/22/2023   PLT 180 11/22/2023   NEUTROABS 9.3 (H) 11/22/2023    CMP  Lab Results  Component Value Date   NA 136 11/22/2023   K 4.0 11/22/2023   CL 98 11/22/2023   CO2 30 11/22/2023   GLUCOSE 103 (H) 11/22/2023   BUN 17 11/22/2023   CREATININE 0.72 11/22/2023   CALCIUM  9.8 11/22/2023   PROT 6.8 11/22/2023   ALBUMIN 4.2 11/22/2023   AST 21 11/22/2023   ALT 15 11/22/2023   ALKPHOS 126 11/22/2023   BILITOT 0.3 11/22/2023   GFRNONAA >60 11/22/2023   GFRAA 117 03/21/2020    Lab Results  Component Value Date   CEA1 4.8 (H) 05/03/2023   CEA 3.86 10/04/2023   CAN199 59 (H) 11/08/2023    Medications: I have reviewed the patient's current medications.   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 with dilation of the left and right biliary systems, left and right hepatic stents and temporary pancreatic stent placed, stricture brushing-adenocarcinoma; HER2 IHC result equivocal (2+), FISH results indeterminant; Tempus-microsatellite stable, tumor mutation burden less than 10, ERBB2 expression, BRCA2 mutated CT chest 05/06/2023: No evidence of metastatic disease Elevated CA 19-9 on 05/03/2023 CT abdomen 05/27/2023 (due), decreased Intermatic biliary duct dilation, status post stenting of common bile duct, persistent soft tissue at the hepatic hilum, pancreatic duct stent in place Upper endoscopy 06/16/2023-gastritis.  Duodenitis.  Plastic biliary and pancreatic stents noted at the major papilla.  Pancreatic stent removed. Cycle 1 gemcitabine /cisplatin /Durvalumab  06/20/2023, day 8 06/28/2023 (Gemcitabine  dose reduced due to thrombocytopenia), Fulphila  Cycle 2 gemcitabine /cisplatin /Durvalumab  07/14/2023, day 8 07/21/2023, Fulphila  Cycle 3 gemcitabine /cisplatin /durvalumab  08/09/2023 (Gem dose reduced C3D8 for thrombocytopenia) Cycle 4 Day 1 gem/cisplatin /durvalumab  08/31/23, gem dose reduced; gemcitabine /cisplatin  09/08/2023 09/23/2023 CT abdomen: No change in the ill-defined soft tissue surrounding the common bile duct, stable periportal and retroperitoneal lymph nodes, biliary stents in place with decreased intrahepatic duct dilation Cycle 5-day 1 gemcitabine /cisplatin /durvalumab  10/05/2023, day 8 10/12/2023 Cycle 6-day 1 gemcitabine /cisplatin /durvalumab  11/02/2023 (gemcitabine  further dose reduced); day 8 11/09/2023 Gemcitabine  held due to thrombocytopenia Cycle 7-day 1 gemcitabine /cisplatin /durvalumab  11/22/2023 Biliary obstruction secondary #1, status post placement of right and left hepatic duct stents 05/06/2023 ERCP  11/07/2023-prior biliary sphincterotomy appeared open.  2 partially occluded stents from  the biliary tree were seen in the major papilla, removed.  Middle third of the main bile duct and lower third of the main bile duct were mildly dilated.  Irregularity/narrowing found in the common hepatic duct (improved from prior).  1 plastic biliary stent was placed into the left hepatic duct. 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 appears stable.  He has completed 6 cycles of gemcitabine /cisplatin /durvalumab .  He continues to tolerate the treatment well.  He will complete day 1 of cycle 7 today.  He will return for an office visit prior to day 8 chemotherapy in 2 weeks.  The CA 19-9 was lower on 11/08/2023.  We will check the CA 19-9 when he returns in 2 weeks.  I will communicate with Dr. Romero following cycle 7 regarding the indication for a return to [facility] to consider surgery.  Arley Hof, MD  11/22/2023  12:21 PM

## 2023-11-23 ENCOUNTER — Inpatient Hospital Stay

## 2023-11-23 VITALS — BP 142/64 | HR 63 | Temp 97.6°F | Resp 17

## 2023-11-23 DIAGNOSIS — Z23 Encounter for immunization: Secondary | ICD-10-CM

## 2023-11-23 DIAGNOSIS — C240 Malignant neoplasm of extrahepatic bile duct: Secondary | ICD-10-CM

## 2023-11-23 MED ORDER — MAGNESIUM SULFATE 2 GM/50ML IV SOLN
2.0000 g | Freq: Once | INTRAVENOUS | Status: AC
  Administered 2023-11-23: 2 g via INTRAVENOUS
  Filled 2023-11-23: qty 50

## 2023-11-23 MED ORDER — INFLUENZA VAC SPLIT HIGH-DOSE 0.5 ML IM SUSY
0.5000 mL | PREFILLED_SYRINGE | Freq: Once | INTRAMUSCULAR | Status: AC
  Administered 2023-11-23: 0.5 mL via INTRAMUSCULAR
  Filled 2023-11-23: qty 0.5

## 2023-11-23 MED ORDER — SODIUM CHLORIDE 0.9 % IV SOLN
700.0000 mg/m2 | Freq: Once | INTRAVENOUS | Status: AC
  Administered 2023-11-23: 1482 mg via INTRAVENOUS
  Filled 2023-11-23: qty 26.28

## 2023-11-23 MED ORDER — SODIUM CHLORIDE 0.9 % IV SOLN
25.0000 mg/m2 | Freq: Once | INTRAVENOUS | Status: AC
  Administered 2023-11-23: 50 mg via INTRAVENOUS
  Filled 2023-11-23: qty 50

## 2023-11-23 MED ORDER — SODIUM CHLORIDE 0.9 % IV SOLN
150.0000 mg | Freq: Once | INTRAVENOUS | Status: AC
  Administered 2023-11-23: 150 mg via INTRAVENOUS
  Filled 2023-11-23: qty 150

## 2023-11-23 MED ORDER — DEXAMETHASONE SOD PHOSPHATE PF 10 MG/ML IJ SOLN
10.0000 mg | Freq: Once | INTRAMUSCULAR | Status: AC
  Administered 2023-11-23: 10 mg via INTRAVENOUS

## 2023-11-23 MED ORDER — SODIUM CHLORIDE 0.9 % IV SOLN: INTRAVENOUS | Status: DC

## 2023-11-23 MED ORDER — PALONOSETRON HCL INJECTION 0.25 MG/5ML
0.2500 mg | Freq: Once | INTRAVENOUS | Status: AC
  Administered 2023-11-23: 0.25 mg via INTRAVENOUS
  Filled 2023-11-23: qty 5

## 2023-11-23 MED ORDER — POTASSIUM CHLORIDE IN NACL 20-0.9 MEQ/L-% IV SOLN
Freq: Once | INTRAVENOUS | Status: AC
  Filled 2023-11-23: qty 1000

## 2023-11-23 MED ORDER — SODIUM CHLORIDE 0.9 % IV SOLN
1500.0000 mg | Freq: Once | INTRAVENOUS | Status: AC
  Administered 2023-11-23: 1500 mg via INTRAVENOUS
  Filled 2023-11-23: qty 30

## 2023-11-23 NOTE — Patient Instructions (Signed)
 [facility] - A DEPT OF [facility]  Discharge Instructions: Thank you for choosing [facility] to provide your oncology and hematology care.   If you have a lab appointment with the Cancer Center, please go directly to the Cancer Center and check in at the registration area.   Wear comfortable clothing and clothing appropriate for easy access to any Portacath or PICC line.   We strive to give you quality time with your provider. You may need to reschedule your appointment if you arrive late (15 or more minutes).  Arriving late affects you and other patients whose appointments are after yours.  Also, if you miss three or more appointments without notifying the office, you may be dismissed from the clinic at the provider's discretion.      For prescription refill requests, have your pharmacy contact our office and allow 72 hours for refills to be completed.    Today you received the following chemotherapy and/or immunotherapy agents: imfinzi , gemzar , cisplatin       To help prevent nausea and vomiting after your treatment, we encourage you to take your nausea medication as directed.  BELOW ARE SYMPTOMS THAT SHOULD BE REPORTED IMMEDIATELY: *FEVER GREATER THAN 100.4 F (38 C) OR HIGHER *CHILLS OR SWEATING *NAUSEA AND VOMITING THAT IS NOT CONTROLLED WITH YOUR NAUSEA MEDICATION *UNUSUAL SHORTNESS OF BREATH *UNUSUAL BRUISING OR BLEEDING *URINARY PROBLEMS (pain or burning when urinating, or frequent urination) *BOWEL PROBLEMS (unusual diarrhea, constipation, pain near the anus) TENDERNESS IN MOUTH AND THROAT WITH OR WITHOUT PRESENCE OF ULCERS (sore throat, sores in mouth, or a toothache) UNUSUAL RASH, SWELLING OR PAIN  UNUSUAL VAGINAL DISCHARGE OR ITCHING   Items with * indicate a potential emergency and should be followed up as soon as possible or go to the Emergency Department if any problems should occur.  Please show the CHEMOTHERAPY ALERT CARD  or IMMUNOTHERAPY ALERT CARD at check-in to the Emergency Department and triage nurse.  Should you have questions after your visit or need to cancel or reschedule your appointment, please contact [facility] - [facility] DEPT [facility]  Dept: [phone]  and follow the prompts.  Office hours are 8:00 a.m. to 4:30 p.m. Monday - Friday. Please note that voicemails left after 4:00 p.m. may not be returned until the following business day.  We are closed weekends and major holidays. You have access to a nurse at all times for urgent questions. Please call the main number to the clinic Dept: [phone] and follow the prompts.   For any non-urgent questions, you may also contact your provider using MyChart. We now offer e-Visits for anyone 35 and older to request care online for non-urgent symptoms. For details visit [email or web address].   Also download the MyChart app! Go to the app store, search MyChart, open the app, select [place], and log in with your MyChart username and password.

## 2023-11-25 ENCOUNTER — Encounter (HOSPITAL_BASED_OUTPATIENT_CLINIC_OR_DEPARTMENT_OTHER): Payer: Self-pay | Admitting: Cardiology

## 2023-11-25 ENCOUNTER — Ambulatory Visit

## 2023-11-25 ENCOUNTER — Ambulatory Visit (INDEPENDENT_AMBULATORY_CARE_PROVIDER_SITE_OTHER): Admitting: Family

## 2023-11-25 VITALS — BP 124/62 | HR 62 | Ht 66.0 in | Wt 222.2 lb

## 2023-11-25 DIAGNOSIS — I10 Essential (primary) hypertension: Secondary | ICD-10-CM | POA: Diagnosis not present

## 2023-11-25 DIAGNOSIS — I272 Pulmonary hypertension, unspecified: Secondary | ICD-10-CM

## 2023-11-25 NOTE — Progress Notes (Signed)
 Cardiology Office Note   Date:  11/25/2023  ID:  Robert [name], DOB 1955-09-11, MRN [id number] PCP: Omar Cumins, FNP  [facility] Providers Cardiologist:  Shelda Bruckner, MD     History of Present Illness Robert [name] is a 68 y.o. 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  Noted new onset atrial flutter, apixaban  added.  Lotensin  decreased due to hypotension.  He was scheduled for cardioversion 09/15/2023 however was in NSR on presentation.  Echo 09/19/2023 normal LVEF 55-60%, no RWMA, RV normal function, RV mildly enlarged, moderately elevated PASP, mild LAE, moderate RAE, mild MR, mild aortic stenosis.  He was seen 11/04/23 with dyspnea. He had been seen recently by PCP with difficulty breathing when laying flat, bilateral edema, weight gain of 20 pounds over 2 weeks.  Per oncology team no chest x-ray unremarkable had completed steroids and Z-Pak and feeling better though persistent DOE. HE had trace to 1+ pitting edema on exam at that time. Pro BNp 1309, Lasix  20mg  daily x 3 days initiated.  At visit 11/04/23 due to [facility] he was advised to continue Lasix  20mg  daily and refills provided.   Presents today for follow up. Attributes his weight gain to recent chemo treatment. Notes it fluctuates surrounding treatments. He will have worsened dyspnea on days 3-4 post chemo which then start improving after that. Does feel Lasix  has made a big difference in his dyspnea. No chest pain, orthopnea, PND.  ROS: Please see the history of present illness.    All other systems reviewed and are negative.   Studies Reviewed      Cardiac Studies & Procedures   ______________________________________________________________________________________________     ECHOCARDIOGRAM  ECHOCARDIOGRAM COMPLETE  09/19/2023  Narrative ECHOCARDIOGRAM REPORT    Patient Name:   Robert [name] Date of Exam: 09/19/2023 Medical Rec #:  [id number]   Height:       66.0 in Accession #:    [id number]  Weight:       215.0 lb Date of Birth:  Oct 19, 1955   BSA:          2.062 m Patient Age:    68 years    BP:           128/62 mmHg Patient Gender: M           HR:           83 bpm. Exam Location:  Outpatient  Procedure: 2D Echo, 3D Echo, Color Doppler, Cardiac Doppler and Strain Analysis (Both Spectral and Color Flow Doppler were utilized during procedure).  Indications:    Atrial Flutter  History:        Patient has no prior history of Echocardiogram examinations. COPD; Risk Factors:Hypertension, Dyslipidemia and Current Smoker. Cholangiocarcinoma on chemotherapy, prior history of prostate cancer.  Sonographer:    Orvil Holmes RDCS Referring Phys: [id number] BRIDGETTE CHRISTOPHER  IMPRESSIONS   1. Left ventricular ejection fraction, by estimation, is 55 to 60%. Left ventricular ejection fraction by 3D volume is 59 %. The left ventricle has normal function. The left ventricle has no regional wall motion abnormalities. Left ventricular diastolic parameters were normal. The average left ventricular global longitudinal strain is -23.6 %. The global longitudinal strain is normal. 2. Right ventricular systolic function is normal. The right ventricular size is mildly enlarged. There is moderately elevated pulmonary artery systolic pressure. The estimated right ventricular systolic pressure is  49.2 mmHg. 3. Left atrial size was mildly dilated. 4. Right atrial size was moderately dilated. 5. The mitral valve is normal in structure. Mild mitral valve regurgitation. No evidence of mitral stenosis. Moderate mitral annular calcification. 6. The aortic valve is calcified. Aortic valve regurgitation is not visualized. Mild aortic valve stenosis. Aortic valve area, by VTI measures 1.77 cm. Aortic valve mean gradient measures  13.0 mmHg. Aortic valve Vmax measures 2.41 m/s. 7. Ascending aorta on recent chest CT 04/2023 demonstrated 34 mm (personally measured). Measurement on current echo is likely artifactual. 8. The inferior vena cava is dilated in size with >50% respiratory variability, suggesting right atrial pressure of 8 mmHg.  FINDINGS Left Ventricle: Left ventricular ejection fraction, by estimation, is 55 to 60%. Left ventricular ejection fraction by 3D volume is 59 %. The left ventricle has normal function. The left ventricle has no regional wall motion abnormalities. The average left ventricular global longitudinal strain is -23.6 %. Strain was performed and the global longitudinal strain is normal. The left ventricular internal cavity size was normal in size. There is no left ventricular hypertrophy. Left ventricular diastolic parameters were normal.  Right Ventricle: The right ventricular size is mildly enlarged. No increase in right ventricular wall thickness. Right ventricular systolic function is normal. There is moderately elevated pulmonary artery systolic pressure. The tricuspid regurgitant velocity is 3.21 m/s, and with an assumed right atrial pressure of 8 mmHg, the estimated right ventricular systolic pressure is 49.2 mmHg.  Left Atrium: Left atrial size was mildly dilated.  Right Atrium: Right atrial size was moderately dilated.  Pericardium: There is no evidence of pericardial effusion.  Mitral Valve: The mitral valve is normal in structure. Moderate mitral annular calcification. Mild mitral valve regurgitation. No evidence of mitral valve stenosis.  Tricuspid Valve: The tricuspid valve is normal in structure. Tricuspid valve regurgitation is mild . No evidence of tricuspid stenosis.  Aortic Valve: The aortic valve is calcified. Aortic valve regurgitation is not visualized. Mild aortic stenosis is present. Aortic valve mean gradient measures 13.0 mmHg. Aortic valve peak gradient measures 23.2  mmHg. Aortic valve area, by VTI measures 1.77 cm.  Pulmonic Valve: The pulmonic valve was normal in structure. Pulmonic valve regurgitation is not visualized. No evidence of pulmonic stenosis.  Aorta: Ascending aorta on recent chest CT 04/2023 demonstrated 34 mm (personally measured). Measurement on current echo is likely artifactual. The aortic root is normal in size and structure.  Venous: The inferior vena cava is dilated in size with greater than 50% respiratory variability, suggesting right atrial pressure of 8 mmHg.  IAS/Shunts: No atrial level shunt detected by color flow Doppler.  Additional Comments: 3D was performed not requiring image post processing on an independent workstation and was normal.   LEFT VENTRICLE PLAX 2D LVIDd:         5.07 cm         Diastology LVIDs:         3.18 cm         LV e' medial:    10.00 cm/s LV PW:         1.19 cm         LV E/e' medial:  13.0 LV IVS:        0.86 cm         LV e' lateral:   12.70 cm/s LVOT diam:     2.10 cm         LV E/e' lateral: 10.2 LV SV:  88 LV SV Index:   42              2D Longitudinal LVOT Area:     3.46 cm        Strain 2D Strain GLS   -27.6 % (A4C): 2D Strain GLS   -22.5 % (A3C): 2D Strain GLS   -20.6 % (A2C): 2D Strain GLS   -23.6 % Avg:  3D Volume EF LV 3D EF:    Left ventricul ar ejection fraction by 3D volume is 59 %.  3D Volume EF: 3D EF:        59 % LV EDV:       182 ml LV ESV:       74 ml LV SV:        108 ml  RIGHT VENTRICLE RV Basal diam:  4.31 cm RV Mid diam:    3.32 cm RV S prime:     15.10 cm/s TAPSE (M-mode): 2.8 cm  LEFT ATRIUM             Index        RIGHT ATRIUM           Index LA diam:        4.70 cm 2.28 cm/m   RA Area:     26.20 cm LA Vol (A2C):   78.1 ml 37.87 ml/m  RA Volume:   89.60 ml  43.45 ml/m LA Vol (A4C):   70.7 ml 34.28 ml/m LA Biplane Vol: 75.1 ml 36.42 ml/m AORTIC VALVE AV Area (Vmax):    1.72 cm AV Area (Vmean):   1.66 cm AV Area (VTI):      1.77 cm AV Vmax:           241.00 cm/s AV Vmean:          164.000 cm/s AV VTI:            0.494 m AV Peak Grad:      23.2 mmHg AV Mean Grad:      13.0 mmHg LVOT Vmax:         120.00 cm/s LVOT Vmean:        78.600 cm/s LVOT VTI:          0.253 m LVOT/AV VTI ratio: 0.51  AORTA Ao Root diam: 3.40 cm Ao Asc diam:  4.10 cm  MITRAL VALVE                TRICUSPID VALVE MV Area (PHT): 4.49 cm     TR Peak grad:   41.2 mmHg MV Decel Time: 169 msec     TR Vmax:        321.00 cm/s MV E velocity: 130.00 cm/s MV A velocity: 66.30 cm/s   SHUNTS MV E/A ratio:  1.96         Systemic VTI:  0.25 m Systemic Diam: 2.10 cm  Robert Parchment MD Electronically signed by Robert Parchment MD Signature Date/Time: 09/19/2023/4:02:59 PM    Final          ______________________________________________________________________________________________      Risk Assessment/Calculations  CHA2DS2-VASc Score = 2   This indicates a 2.2% annual risk of stroke. The patient's score is based upon: CHF History: 0 HTN History: 1 Diabetes History: 0 Stroke History: 0 Vascular Disease History: 0 Age Score: 1 Gender Score: 0            Physical Exam VS:  BP 124/62   Pulse 62   Ht 5' 6 (1.676 m)  Wt 222 lb 3.2 oz (100.8 kg)   SpO2 90%   BMI 35.86 kg/m        Wt Readings from Last 3 Encounters:  11/25/23 222 lb 3.2 oz (100.8 kg)  11/22/23 215 lb (97.5 kg)  11/08/23 211 lb (95.7 kg)    GEN: Well nourished, well developed in no acute distress NECK: No JVD; No carotid bruits CARDIAC: RRR, no murmurs, rubs, gallops RESPIRATORY:  Clear to auscultation without rales, wheezing or rhonchi  ABDOMEN: Soft, non-tender, non-distended EXTREMITIES:  No edema; No deformity   ASSESSMENT AND PLAN  Pulmonary hypertension  - 10/2023 LVEF 55-60%, normal diastolic parameters, RVSF normal, RV mildly enlarged, moderately elevated PASP, mild MR, mild AS. Anticipate related to COPD, upcoming visit to establish with  pulmonology. Dyspnea improved on Lasix  20mg  daily, continue same. If worsened dyspnea in the future, consider referral to [facility] to discuss additional therapies for PAH.   Mild AS / MR - Echo 10/2023 mild MR, mild AS. Consider repeat echo in one year, can be coordinated at follow up.   COPD - upcoming visit with pulmonology to establish care. Oes not that inhaler does not seem to be lasting 12 hours, encouraged to discuss alternative with pulmonology.   Atrial flutter / Hypercoagulable state - s/p DCCV 09/15/23. Continue Eliquis  5mg  BID. Denies palpitations.   Cholangiocarcinoma on chemotherapy - per oncology team.   HTN - BP controlled. Continue half tablet of benazepril -hydrochlorothiazide  20-25mg  daily. If develops symptomatic hypotension, consider transition to just benazepril .        Dispo: follow up in 6 mos  Signed, Reche GORMAN Finder, NP

## 2023-11-25 NOTE — Patient Instructions (Addendum)
 Medication Instructions:  Continue your current medications.   *If you need a refill on your cardiac medications before your next appointment, please call your pharmacy*   Follow-Up: At [facility], you and your health needs are our priority.  As part of our continuing mission to provide you with exceptional heart care, our providers are all part of one team.  This team includes your primary Cardiologist (physician) and Advanced Practice Providers or APPs (Physician Assistants and Nurse Practitioners) who all work together to provide you with the care you need, when you need it.  Your next appointment:   6 month(s)  Provider:   Sheryle Donning, MD or Neomi Banks, NP    We recommend signing up for the patient portal called "MyChart".  Sign up information is provided on this After Visit Summary.  MyChart is used to connect with patients for Virtual Visits (Telemedicine).  Patients are able to view lab/test results, encounter notes, upcoming appointments, etc.  Non-urgent messages can be sent to your provider as well.   To learn more about what you can do with MyChart, go to [email or web address].

## 2023-11-29 ENCOUNTER — Inpatient Hospital Stay

## 2023-11-29 ENCOUNTER — Other Ambulatory Visit: Payer: Self-pay | Admitting: Oncology

## 2023-11-29 ENCOUNTER — Inpatient Hospital Stay (HOSPITAL_BASED_OUTPATIENT_CLINIC_OR_DEPARTMENT_OTHER): Admitting: Nurse Practitioner

## 2023-11-29 ENCOUNTER — Encounter: Payer: Self-pay | Admitting: Nurse Practitioner

## 2023-11-29 VITALS — BP 130/79 | HR 77 | Temp 97.6°F | Resp 18 | Ht 66.0 in | Wt 217.4 lb

## 2023-11-29 DIAGNOSIS — C240 Malignant neoplasm of extrahepatic bile duct: Secondary | ICD-10-CM

## 2023-11-29 LAB — CBC WITH DIFFERENTIAL (CANCER CENTER ONLY)
Abs Immature Granulocytes: 0.01 K/uL (ref 0.00–0.07)
Basophils Absolute: 0 K/uL (ref 0.0–0.1)
Basophils Relative: 1 %
Eosinophils Absolute: 0.1 K/uL (ref 0.0–0.5)
Eosinophils Relative: 2 %
HCT: 30.3 % — ABNORMAL LOW (ref 39.0–52.0)
Hemoglobin: 10.3 g/dL — ABNORMAL LOW (ref 13.0–17.0)
Immature Granulocytes: 0 %
Lymphocytes Relative: 44 %
Lymphs Abs: 2.1 K/uL (ref 0.7–4.0)
MCH: 35.8 pg — ABNORMAL HIGH (ref 26.0–34.0)
MCHC: 34 g/dL (ref 30.0–36.0)
MCV: 105.2 fL — ABNORMAL HIGH (ref 80.0–100.0)
Monocytes Absolute: 0.1 K/uL (ref 0.1–1.0)
Monocytes Relative: 2 %
Neutro Abs: 2.4 K/uL (ref 1.7–7.7)
Neutrophils Relative %: 51 %
Platelet Count: 95 K/uL — ABNORMAL LOW (ref 150–400)
RBC: 2.88 MIL/uL — ABNORMAL LOW (ref 4.22–5.81)
RDW: 15.1 % (ref 11.5–15.5)
WBC Count: 4.8 K/uL (ref 4.0–10.5)
nRBC: 0 % (ref 0.0–0.2)

## 2023-11-29 LAB — CMP (CANCER CENTER ONLY)
ALT: 31 U/L (ref 0–44)
AST: 22 U/L (ref 15–41)
Albumin: 3.8 g/dL (ref 3.5–5.0)
Alkaline Phosphatase: 83 U/L (ref 38–126)
Anion gap: 7 (ref 5–15)
BUN: 20 mg/dL (ref 8–23)
CO2: 31 mmol/L (ref 22–32)
Calcium: 9.7 mg/dL (ref 8.9–10.3)
Chloride: 99 mmol/L (ref 98–111)
Creatinine: 0.62 mg/dL (ref 0.61–1.24)
GFR, Estimated: >60 mL/min (ref >60)
Glucose, Bld: 113 mg/dL — ABNORMAL HIGH (ref 70–99)
Potassium: 4.2 mmol/L (ref 3.5–5.1)
Sodium: 137 mmol/L (ref 135–145)
Total Bilirubin: 0.3 mg/dL (ref 0.0–1.2)
Total Protein: 6.3 g/dL — ABNORMAL LOW (ref 6.5–8.1)

## 2023-11-29 LAB — MAGNESIUM: Magnesium: 1.9 mg/dL (ref 1.7–2.4)

## 2023-11-29 NOTE — Patient Instructions (Signed)

## 2023-11-29 NOTE — Progress Notes (Signed)
  [facility] OFFICE PROGRESS NOTE   Diagnosis: Cholangiocarcinoma  INTERVAL HISTORY:   [name] returns as scheduled.  He completed cycle 7-day 1 gemcitabine /cisplatin /Durvalumab  11/22/2023.  He denies nausea/vomiting.  No mouth sores.  No diarrhea.  No rash.  He has a good appetite.  No abdominal pain.  No bleeding.  No neuropathy symptoms.  Stable dyspnea on exertion.  Objective:  Vital signs in last 24 hours:  Blood pressure 130/79, pulse 77, temperature 97.6 F (36.4 C), temperature source Temporal, resp. rate 18, height 5' 6 (1.676 m), weight 217 lb 6.4 oz (98.6 kg), SpO2 98%.    HEENT: No thrush or ulcers. Resp: Distant breath sounds.  Anterior expiratory wheezes.  No respiratory distress. Cardio: Regular rate and rhythm. GI: No hepatosplenomegaly.  Nontender.  No mass.  No apparent ascites. Vascular: Trace lower leg edema bilaterally  Skin: No rash. Port-A-Cath without erythema.  Lab Results:  Lab Results  Component Value Date   WBC 4.8 11/29/2023   HGB 10.3 (L) 11/29/2023   HCT 30.3 (L) 11/29/2023   MCV 105.2 (H) 11/29/2023   PLT 95 (L) 11/29/2023   NEUTROABS 2.4 11/29/2023    Imaging:  No results found.  Medications: I have reviewed the patient's current medications.  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 with dilation of the left and right biliary systems, left and right hepatic stents and temporary pancreatic stent placed, stricture brushing-adenocarcinoma; HER2 IHC result equivocal (2+), FISH results indeterminant; Tempus-microsatellite  stable, tumor mutation burden less than 10, ERBB2 expression, BRCA2 mutated CT chest 05/06/2023: No evidence of metastatic disease Elevated CA 19-9 on 05/03/2023 CT abdomen 05/27/2023 (due), decreased Intermatic biliary duct dilation, status post stenting of common bile duct, persistent soft tissue at the hepatic hilum, pancreatic duct stent in place Upper endoscopy 06/16/2023-gastritis.  Duodenitis.  Plastic biliary and pancreatic stents noted at the major papilla.  Pancreatic stent removed. Cycle 1 gemcitabine /cisplatin /Durvalumab  06/20/2023, day 8 06/28/2023 (Gemcitabine  dose reduced due to thrombocytopenia), Fulphila  Cycle 2 gemcitabine /cisplatin /Durvalumab  07/14/2023, day 8 07/21/2023, Fulphila  Cycle 3 gemcitabine /cisplatin /durvalumab  08/09/2023 (Gem dose reduced C3D8 for thrombocytopenia) Cycle 4 Day 1 gem/cisplatin /durvalumab  08/31/23, gem dose reduced; gemcitabine /cisplatin  09/08/2023 09/23/2023 CT abdomen: No change in the ill-defined soft tissue surrounding the common bile duct, stable periportal and retroperitoneal lymph nodes, biliary stents in place with decreased intrahepatic duct dilation Cycle 5-day 1 gemcitabine /cisplatin /durvalumab  10/05/2023, day 8 10/12/2023 Cycle 6-day 1 gemcitabine /cisplatin /durvalumab  11/02/2023 (gemcitabine  further dose reduced); day 8 11/09/2023 Gemcitabine  held due to thrombocytopenia Cycle 7-day 1 gemcitabine /cisplatin /durvalumab  11/22/2023, day 8 11/30/2023 (Gemcitabine  dose reduced due to thrombocytopenia) Biliary obstruction secondary #1, status post placement of right and left hepatic duct stents 05/06/2023 ERCP 11/07/2023-prior biliary sphincterotomy appeared open.  2 partially occluded stents from the biliary tree were seen in the major papilla, removed.  Middle third of the main bile duct and lower third of the main bile duct were mildly dilated.  Irregularity/narrowing found in the common hepatic duct (improved from prior).  1 plastic biliary stent was placed into the  left hepatic duct. 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 Seidman appears stable.  He is completing cycle 7 gemcitabine /cisplatin /Durvalumab .  He tolerated the day 1 treatment well.  Plan to proceed with day 8 tomorrow as scheduled.  Gemcitabine  will be dose reduced due to thrombocytopenia.  Timing and location of restaging CTs to be determined.  CBC and chemistry panel reviewed.  Labs are adequate for treatment.  We discussed the mild thrombocytopenia.  He understands to contact the office with any bleeding.  Gemcitabine  dose reduction.  He will return for follow-up and treatment in 2 weeks.  We are available to see him sooner if needed.    [name] ANP/GNP-BC   11/29/2023  2:29 PM

## 2023-11-30 ENCOUNTER — Inpatient Hospital Stay

## 2023-11-30 VITALS — BP 149/66 | HR 64 | Temp 98.2°F | Resp 16

## 2023-11-30 DIAGNOSIS — C240 Malignant neoplasm of extrahepatic bile duct: Secondary | ICD-10-CM

## 2023-11-30 LAB — CANCER ANTIGEN 19-9: CA 19-9: 42 U/mL — ABNORMAL HIGH (ref 0–35)

## 2023-11-30 MED ORDER — SODIUM CHLORIDE 0.9 % IV SOLN: INTRAVENOUS | Status: DC

## 2023-11-30 MED ORDER — MAGNESIUM SULFATE 2 GM/50ML IV SOLN
2.0000 g | Freq: Once | INTRAVENOUS | Status: AC
  Administered 2023-11-30: 2 g via INTRAVENOUS
  Filled 2023-11-30: qty 50

## 2023-11-30 MED ORDER — SODIUM CHLORIDE 0.9 % IV SOLN
150.0000 mg | Freq: Once | INTRAVENOUS | Status: AC
  Administered 2023-11-30: 150 mg via INTRAVENOUS
  Filled 2023-11-30: qty 150

## 2023-11-30 MED ORDER — DEXAMETHASONE SOD PHOSPHATE PF 10 MG/ML IJ SOLN
10.0000 mg | Freq: Once | INTRAMUSCULAR | Status: AC
  Administered 2023-11-30: 10 mg via INTRAVENOUS

## 2023-11-30 MED ORDER — PALONOSETRON HCL INJECTION 0.25 MG/5ML
0.2500 mg | Freq: Once | INTRAVENOUS | Status: AC
  Administered 2023-11-30: 0.25 mg via INTRAVENOUS
  Filled 2023-11-30: qty 5

## 2023-11-30 MED ORDER — POTASSIUM CHLORIDE IN NACL 20-0.9 MEQ/L-% IV SOLN
Freq: Once | INTRAVENOUS | Status: AC
  Filled 2023-11-30: qty 1000

## 2023-11-30 MED ORDER — SODIUM CHLORIDE 0.9 % IV SOLN
25.0000 mg/m2 | Freq: Once | INTRAVENOUS | Status: AC
  Administered 2023-11-30: 50 mg via INTRAVENOUS
  Filled 2023-11-30: qty 50

## 2023-11-30 MED ORDER — SODIUM CHLORIDE 0.9 % IV SOLN
1000.0000 mg | Freq: Once | INTRAVENOUS | Status: AC
  Administered 2023-11-30: 1000 mg via INTRAVENOUS
  Filled 2023-11-30: qty 26.3

## 2023-11-30 NOTE — Patient Instructions (Signed)
 [facility]  Discharge Instructions: Thank you for choosing [facility] to provide your oncology and hematology care.   If you have a lab appointment with the Cancer Center, please go directly to the Cancer Center and check in at the registration area.   Wear comfortable clothing and clothing appropriate for easy access to any Portacath or PICC line.   We strive to give you quality time with your provider. You may need to reschedule your appointment if you arrive late (15 or more minutes).  Arriving late affects you and other patients whose appointments are after yours.  Also, if you miss three or more appointments without notifying the office, you may be dismissed from the clinic at the provider's discretion.      For prescription refill requests, have your pharmacy contact our office and allow 72 hours for refills to be completed.    Today you received the following chemotherapy and/or immunotherapy agents gemzar, cisplatin      To help prevent nausea and vomiting after your treatment, we encourage you to take your nausea medication as directed.  BELOW ARE SYMPTOMS THAT SHOULD BE REPORTED IMMEDIATELY: *FEVER GREATER THAN 100.4 F (38 C) OR HIGHER *CHILLS OR SWEATING *NAUSEA AND VOMITING THAT IS NOT CONTROLLED WITH YOUR NAUSEA MEDICATION *UNUSUAL SHORTNESS OF BREATH *UNUSUAL BRUISING OR BLEEDING *URINARY PROBLEMS (pain or burning when urinating, or frequent urination) *BOWEL PROBLEMS (unusual diarrhea, constipation, pain near the anus) TENDERNESS IN MOUTH AND THROAT WITH OR WITHOUT PRESENCE OF ULCERS (sore throat, sores in mouth, or a toothache) UNUSUAL RASH, SWELLING OR PAIN  UNUSUAL VAGINAL DISCHARGE OR ITCHING   Items with * indicate a potential emergency and should be followed up as soon as possible or go to the Emergency Department if any problems should occur.  Please show the CHEMOTHERAPY ALERT CARD or  IMMUNOTHERAPY ALERT CARD at check-in to the Emergency Department and triage nurse.  Should you have questions after your visit or need to cancel or reschedule your appointment, please contact [facility]  Dept: [phone]  and follow the prompts.  Office hours are 8:00 a.m. to 4:30 p.m. Monday - Friday. Please note that voicemails left after 4:00 p.m. may not be returned until the following business day.  We are closed weekends and major holidays. You have access to a nurse at all times for urgent questions. Please call the main number to the clinic Dept: [phone] and follow the prompts.   For any non-urgent questions, you may also contact your provider using MyChart. We now offer e-Visits for anyone 40 and older to request care online for non-urgent symptoms. For details visit [email or web address].   Also download the MyChart app! Go to the app store, search "MyChart", open the app, select [place], and log in with your MyChart username and password.

## 2023-12-02 ENCOUNTER — Other Ambulatory Visit: Payer: Self-pay | Admitting: Nurse Practitioner

## 2023-12-02 ENCOUNTER — Inpatient Hospital Stay

## 2023-12-02 VITALS — BP 148/63 | HR 69 | Temp 98.0°F | Resp 18

## 2023-12-02 DIAGNOSIS — C240 Malignant neoplasm of extrahepatic bile duct: Secondary | ICD-10-CM

## 2023-12-02 MED ORDER — PEGFILGRASTIM-JMDB 6 MG/0.6ML ~~LOC~~ SOSY
6.0000 mg | PREFILLED_SYRINGE | Freq: Once | SUBCUTANEOUS | Status: AC
  Administered 2023-12-02: 6 mg via SUBCUTANEOUS
  Filled 2023-12-02: qty 0.6

## 2023-12-02 NOTE — Patient Instructions (Signed)
 [facility] - A DEPT OF [place]  Discharge Instructions: Thank you for choosing [facility] to provide your oncology and hematology care.   If you have a lab appointment with the Cancer Center, please go directly to the Cancer Center and check in at the registration area.   Wear comfortable clothing and clothing appropriate for easy access to any Portacath or PICC line.   We strive to give you quality time with your provider. You may need to reschedule your appointment if you arrive late (15 or more minutes).  Arriving late affects you and other patients whose appointments are after yours.  Also, if you miss three or more appointments without notifying the office, you may be dismissed from the clinic at the provider's discretion.      For prescription refill requests, have your pharmacy contact our office and allow 72 hours for refills to be completed.    Today you received the following chemotherapy and/or immunotherapy agents: fulphila .      To help prevent nausea and vomiting after your treatment, we encourage you to take your nausea medication as directed.  BELOW ARE SYMPTOMS THAT SHOULD BE REPORTED IMMEDIATELY: *FEVER GREATER THAN 100.4 F (38 C) OR HIGHER *CHILLS OR SWEATING *NAUSEA AND VOMITING THAT IS NOT CONTROLLED WITH YOUR NAUSEA MEDICATION *UNUSUAL SHORTNESS OF BREATH *UNUSUAL BRUISING OR BLEEDING *URINARY PROBLEMS (pain or burning when urinating, or frequent urination) *BOWEL PROBLEMS (unusual diarrhea, constipation, pain near the anus) TENDERNESS IN MOUTH AND THROAT WITH OR WITHOUT PRESENCE OF ULCERS (sore throat, sores in mouth, or a toothache) UNUSUAL RASH, SWELLING OR PAIN  UNUSUAL VAGINAL DISCHARGE OR ITCHING   Items with * indicate a potential emergency and should be followed up as soon as possible or go to the Emergency Department if any problems should occur.  Please show the CHEMOTHERAPY ALERT CARD or IMMUNOTHERAPY  ALERT CARD at check-in to the Emergency Department and triage nurse.  Should you have questions after your visit or need to cancel or reschedule your appointment, please contact [facility] - [facility] DEPT [facility]  Dept: [phone]  and follow the prompts.  Office hours are 8:00 a.m. to 4:30 p.m. Monday - Friday. Please note that voicemails left after 4:00 p.m. may not be returned until the following business day.  We are closed weekends and major holidays. You have access to a nurse at all times for urgent questions. Please call the main number to the clinic Dept: [phone] and follow the prompts.   For any non-urgent questions, you may also contact your provider using MyChart. We now offer e-Visits for anyone 66 and older to request care online for non-urgent symptoms. For details visit [email or web address].   Also download the MyChart app! Go to the app store, search MyChart, open the app, select , and log in with your MyChart username and password.

## 2023-12-06 ENCOUNTER — Other Ambulatory Visit: Payer: Self-pay | Admitting: Nurse Practitioner

## 2023-12-06 DIAGNOSIS — C240 Malignant neoplasm of extrahepatic bile duct: Secondary | ICD-10-CM

## 2023-12-07 ENCOUNTER — Ambulatory Visit (HOSPITAL_BASED_OUTPATIENT_CLINIC_OR_DEPARTMENT_OTHER): Admitting: Pulmonary Disease

## 2023-12-07 ENCOUNTER — Encounter (HOSPITAL_BASED_OUTPATIENT_CLINIC_OR_DEPARTMENT_OTHER): Payer: Self-pay | Admitting: Pulmonary Disease

## 2023-12-07 VITALS — BP 144/56 | HR 68 | Ht 66.0 in | Wt 216.3 lb

## 2023-12-07 DIAGNOSIS — C249 Malignant neoplasm of biliary tract, unspecified: Secondary | ICD-10-CM

## 2023-12-07 DIAGNOSIS — Z8546 Personal history of malignant neoplasm of prostate: Secondary | ICD-10-CM | POA: Diagnosis not present

## 2023-12-07 DIAGNOSIS — J441 Chronic obstructive pulmonary disease with (acute) exacerbation: Secondary | ICD-10-CM | POA: Diagnosis not present

## 2023-12-07 DIAGNOSIS — J449 Chronic obstructive pulmonary disease, unspecified: Secondary | ICD-10-CM

## 2023-12-07 DIAGNOSIS — F17210 Nicotine dependence, cigarettes, uncomplicated: Secondary | ICD-10-CM

## 2023-12-07 DIAGNOSIS — J439 Emphysema, unspecified: Secondary | ICD-10-CM | POA: Diagnosis not present

## 2023-12-07 DIAGNOSIS — E785 Hyperlipidemia, unspecified: Secondary | ICD-10-CM

## 2023-12-07 MED ORDER — IPRATROPIUM-ALBUTEROL 0.5-2.5 (3) MG/3ML IN SOLN: 3.0000 mL | Freq: Two times a day (BID) | RESPIRATORY_TRACT | 5 refills | Status: AC | PRN

## 2023-12-07 MED ORDER — PREDNISONE 10 MG PO TABS: ORAL_TABLET | ORAL | 0 refills | Status: AC

## 2023-12-07 MED ORDER — AZITHROMYCIN 250 MG PO TABS: ORAL_TABLET | ORAL | 0 refills | Status: DC

## 2023-12-07 NOTE — Patient Instructions (Addendum)
 COPD exacerbation --Prednisone  and azithromycin  ordered  Emphysema --CONTINUE Breztri  TWO puffs in the morning and evening. Rinse mouth out after use  >Workplace provides medication --CONTINUE Albuterol  TWO puffs AS NEEDED for shortness of breath or wheezing --START DUONEBS (nebulizer) once a day --Schedule pulmonary function test

## 2023-12-07 NOTE — Progress Notes (Signed)
 Subjective:   PATIENT ID: Robert [name] GENDER: male DOB: 03/15/1955, MRN: [id number]  Chief Complaint  Patient presents with   Consult    Reason for Visit: New consult for shortness of breath, COPD  [name] is 68 year old male active smoker with cholangiocarcinoma, prostate cancer in 2018 in remission, COPD, HTN, HLD, atrial flutter on eliquis , secondary thrombocytopenia who presents for evaluation for shortness of breath.    Last year he lost 50 lbs on Wegovy  but had been taken off after his diagnosis of cholangiocarcinoma in April 2025. Currently undergoing chemotherapy with gemcitabine /cisplatin /durvalumab . He has had worsening shortness of breath in the last 3 weeks associated with wheezing at night. Has chronic smoker's productive cough that has worsened in the mornings. Has not had any antibiotics and steroids for this round. Last hospitalized for COPD exacerbation secondary to pneumonia in 02/2022. Had antibiotics and prednisone  shot 6 months for URI.  Social History: 1/2 ppd. Smoked 1ppd x 30 years. Denies illicit drug use or vaping  Environmental exposures: Machinist  I have personally reviewed patient's past medical/family/social history, allergies, current medications.  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   Cancer (HCC)    COPD (chronic obstructive pulmonary disease) (HCC)    Elevated hemoglobin A1c    Elevated PSA 03/16/2016   Hypertension    Personal history of colonic adenomas 12/28/2007   Vitamin D  deficiency      Family History  Problem Relation Age of Onset   Lung cancer Mother    Heart disease Brother    Cancer Maternal Aunt        NOS   Lung disease Neg Hx    Colon cancer Neg Hx    Rectal cancer Neg Hx    Stomach cancer Neg Hx      Social History   Occupational History   Not on file  Tobacco Use   Smoking status: Every Day    Current packs/day: 0.50    Average  packs/day: 0.5 packs/day for 40.0 years (20.0 ttl pk-yrs)    Types: Cigarettes    Passive exposure: Current (PAST too)   Smokeless tobacco: Never   Tobacco comments:    has decreased smoking significantly with goal of quitting  Vaping Use   Vaping status: Never Used  Substance and Sexual Activity   Alcohol use: Yes    Alcohol/week: 4.0 standard drinks of alcohol    Types: 4 Cans of beer per week    Comment: occasionally   Drug use: No   Sexual activity: Yes    Allergies  Allergen Reactions   Codeine Itching   Isovue  [Iopamidol ] Hives    Pt. Developed 1 hive after injection; Dr. Graham spoke with patient;recommends 13 hr prep in the future     Outpatient Medications Prior to Visit  Medication Sig Dispense Refill   albuterol  (VENTOLIN  HFA) 108 (90 Base) MCG/ACT inhaler Inhale 2 puffs into the lungs every 6 (six) hours as needed for wheezing or shortness of breath. 8 g 0   apixaban  (ELIQUIS ) 5 MG TABS tablet Take 1 tablet (5 mg total) by mouth 2 (two) times daily. 60 tablet 2   benazepril -hydrochlorthiazide (LOTENSIN  HCT) 20-25 MG tablet Take 0.5 tablets by mouth daily.     BREZTRI  AEROSPHERE 160-9-4.8 MCG/ACT AERO inhaler Inhale 2 puffs into the lungs 2 (two) times daily.     furosemide  (LASIX ) 20 MG tablet Take 1 tablet (  20 mg total) by mouth daily. 90 tablet 3   predniSONE  (DELTASONE ) 50 MG tablet Take 50 mg by mouth as directed. For CT prep     ondansetron  (ZOFRAN ) 8 MG tablet Take 1 tablet (8 mg total) by mouth every 8 (eight) hours as needed (May use 3 days after chemo as needed for nausea). (Patient not taking: Reported on 12/07/2023) 20 tablet 0   OXYGEN  Inhale 3 L into the lungs as needed (as directed). (Patient not taking: Reported on 12/07/2023)     Facility-Administered Medications Prior to Visit  Medication Dose Route Frequency Provider Last Rate Last Admin   pegfilgrastim -jmdb (FULPHILA ) injection 6 mg  6 mg Subcutaneous Once Debby Olam POUR, NP        Review of  Systems  Constitutional:  Negative for chills, diaphoresis, fever, malaise/fatigue and weight loss.  HENT:  Negative for congestion.   Respiratory:  Negative for cough, hemoptysis, sputum production, shortness of breath and wheezing.   Cardiovascular:  Negative for chest pain, palpitations and leg swelling.     Objective:   Vitals:   12/07/23 1420  BP: (!) 144/56  Pulse: 68  SpO2: 100%  Weight: 216 lb 4.8 oz (98.1 kg)  Height: 5' 6 (1.676 m)   SpO2: 100 %  Physical Exam: General: Well-appearing, no acute distress HENT: [place], AT Eyes: EOMI, no scleral icterus Respiratory: Clear to auscultation bilaterally.  No crackles, wheezing or rales Cardiovascular: RRR, -M/R/G, no JVD Extremities:-Edema,-tenderness Neuro: AAO x4, CNII-XII grossly intact Psych: Normal mood, normal affect  Data Reviewed:  Imaging: CT CAP 05/06/23 - Mild emphysema in upper lobes, atelectasis in lingula and left lower lobe  PFT: 08/09/16 FVC 2.71 (63%) FEV1 1.65 (50%) Ratio 61  TLC 102% DLCO 74% Interpretation: Moderately severe obstructive defect with air trapping and reduced DLCO c/w emphysema  Labs: CBC    Component Value Date/Time   WBC 4.8 11/29/2023 1333   WBC 8.2 06/10/2023 0940   RBC 2.88 (L) 11/29/2023 1333   HGB 10.3 (L) 11/29/2023 1333   HCT 30.3 (L) 11/29/2023 1333   PLT 95 (L) 11/29/2023 1333   MCV 105.2 (H) 11/29/2023 1333   MCH 35.8 (H) 11/29/2023 1333   MCHC 34.0 11/29/2023 1333   RDW 15.1 11/29/2023 1333   LYMPHSABS 2.1 11/29/2023 1333   MONOABS 0.1 11/29/2023 1333   EOSABS 0.1 11/29/2023 1333   BASOSABS 0.0 11/29/2023 1333        Assessment & Plan:   Discussion: 68 year old male active smoker with cholangiocarcinoma on chemotherapy, prostate cancer in 2018 in remission, COPD, HTN, HLD, atrial flutter on eliquis , secondary thrombocytopenia who presents for evaluation for shortness of breath. Will evaluate with PFTs to determine progression of COPD +/- underlying  restrictive defect secondary to environmental exposures with his work.  COPD exacerbation --Prednisone  and azithromycin  ordered  Emphysema --CONTINUE Breztri  TWO puffs in the morning and evening. Rinse mouth out after use  >Workplace provides medication --CONTINUE Albuterol  TWO puffs AS NEEDED for shortness of breath or wheezing --START DUONEBS (nebulizer) once a day  Health Maintenance Immunization History  Administered Date(s) Administered   INFLUENZA, HIGH DOSE SEASONAL PF 10/14/2022, 11/23/2023   Influenza,inj,Quad PF,6+ Mos 12/27/2017, 12/15/2018, 01/07/2022   Janssen (J&J) SARS-COV-2 Vaccination 03/27/2019   PPD Test 03/21/2020, 03/31/2021   Pneumococcal Polysaccharide-23 01/11/1997, 12/27/2017   Td 01/12/2004   Tdap 07/09/2016   Zoster, Live 01/12/2008   CT Lung Screen - qualified but currently receiving cancer screenings  Orders Placed This Encounter  Procedures   Pulmonary function test    Standing Status:   Future    Expiration Date:   12/06/2024    Where should this test be performed?:   Outpatient Pulmonary    What type of PFT is being ordered?:   Full PFT   Meds ordered this encounter  Medications   predniSONE  (DELTASONE ) 10 MG tablet    Sig: Take 4 tablets (40 mg total) by mouth daily with breakfast for 2 days, THEN 3 tablets (30 mg total) daily with breakfast for 2 days, THEN 2 tablets (20 mg total) daily with breakfast for 2 days, THEN 1 tablet (10 mg total) daily with breakfast for 2 days.    Dispense:  20 tablet    Refill:  0   azithromycin  (ZITHROMAX ) 250 MG tablet    Sig: Take two tablets on day 1, then one tablet daily on day 2-5.    Dispense:  6 tablet    Refill:  0   ipratropium-albuterol  (DUONEB) 0.5-2.5 (3) MG/3ML SOLN    Sig: Take 3 mLs by nebulization 2 (two) times daily as needed.    Dispense:  180 mL    Refill:  5    Return for after PFT in January.  I have spent a total time of 45-minutes on the day of the appointment reviewing prior  documentation, coordinating care and discussing medical diagnosis and plan with the patient/family. Imaging, labs and tests included in this note have been reviewed and interpreted independently by me.  Pamula Luther Slater Staff, MD [facility] 12/07/2023 2:54 PM

## 2023-12-08 ENCOUNTER — Other Ambulatory Visit: Payer: Self-pay

## 2023-12-09 ENCOUNTER — Other Ambulatory Visit: Payer: Self-pay

## 2023-12-09 ENCOUNTER — Ambulatory Visit (HOSPITAL_COMMUNITY): Attending: Nurse Practitioner

## 2023-12-09 ENCOUNTER — Telehealth: Payer: Self-pay | Admitting: Hematology

## 2023-12-09 ENCOUNTER — Inpatient Hospital Stay

## 2023-12-09 ENCOUNTER — Observation Stay (HOSPITAL_COMMUNITY)

## 2023-12-09 ENCOUNTER — Other Ambulatory Visit: Payer: Self-pay | Admitting: Oncology

## 2023-12-09 ENCOUNTER — Telehealth: Payer: Self-pay

## 2023-12-09 ENCOUNTER — Encounter (HOSPITAL_COMMUNITY): Payer: Self-pay

## 2023-12-09 ENCOUNTER — Observation Stay (HOSPITAL_COMMUNITY): Admission: EM | Admit: 2023-12-09 | Discharge: 2023-12-10 | Disposition: A | Source: Ambulatory Visit

## 2023-12-09 DIAGNOSIS — K5730 Diverticulosis of large intestine without perforation or abscess without bleeding: Secondary | ICD-10-CM | POA: Insufficient documentation

## 2023-12-09 DIAGNOSIS — E871 Hypo-osmolality and hyponatremia: Secondary | ICD-10-CM | POA: Insufficient documentation

## 2023-12-09 DIAGNOSIS — I4892 Unspecified atrial flutter: Secondary | ICD-10-CM | POA: Insufficient documentation

## 2023-12-09 DIAGNOSIS — Z720 Tobacco use: Secondary | ICD-10-CM | POA: Diagnosis present

## 2023-12-09 DIAGNOSIS — E669 Obesity, unspecified: Secondary | ICD-10-CM | POA: Insufficient documentation

## 2023-12-09 DIAGNOSIS — D696 Thrombocytopenia, unspecified: Secondary | ICD-10-CM | POA: Diagnosis not present

## 2023-12-09 DIAGNOSIS — J441 Chronic obstructive pulmonary disease with (acute) exacerbation: Secondary | ICD-10-CM | POA: Diagnosis present

## 2023-12-09 DIAGNOSIS — Z8546 Personal history of malignant neoplasm of prostate: Secondary | ICD-10-CM | POA: Insufficient documentation

## 2023-12-09 DIAGNOSIS — J439 Emphysema, unspecified: Secondary | ICD-10-CM | POA: Diagnosis not present

## 2023-12-09 DIAGNOSIS — C23 Malignant neoplasm of gallbladder: Secondary | ICD-10-CM | POA: Diagnosis not present

## 2023-12-09 DIAGNOSIS — E785 Hyperlipidemia, unspecified: Secondary | ICD-10-CM | POA: Diagnosis present

## 2023-12-09 DIAGNOSIS — K449 Diaphragmatic hernia without obstruction or gangrene: Secondary | ICD-10-CM | POA: Insufficient documentation

## 2023-12-09 DIAGNOSIS — I10 Essential (primary) hypertension: Secondary | ICD-10-CM | POA: Insufficient documentation

## 2023-12-09 DIAGNOSIS — F1090 Alcohol use, unspecified, uncomplicated: Secondary | ICD-10-CM | POA: Insufficient documentation

## 2023-12-09 DIAGNOSIS — Z6834 Body mass index (BMI) 34.0-34.9, adult: Secondary | ICD-10-CM | POA: Insufficient documentation

## 2023-12-09 DIAGNOSIS — F17210 Nicotine dependence, cigarettes, uncomplicated: Secondary | ICD-10-CM | POA: Insufficient documentation

## 2023-12-09 DIAGNOSIS — C221 Intrahepatic bile duct carcinoma: Secondary | ICD-10-CM | POA: Insufficient documentation

## 2023-12-09 DIAGNOSIS — C240 Malignant neoplasm of extrahepatic bile duct: Secondary | ICD-10-CM

## 2023-12-09 DIAGNOSIS — F17220 Nicotine dependence, chewing tobacco, uncomplicated: Secondary | ICD-10-CM | POA: Insufficient documentation

## 2023-12-09 DIAGNOSIS — E559 Vitamin D deficiency, unspecified: Secondary | ICD-10-CM | POA: Diagnosis present

## 2023-12-09 DIAGNOSIS — E782 Mixed hyperlipidemia: Secondary | ICD-10-CM | POA: Diagnosis present

## 2023-12-09 DIAGNOSIS — I700 Atherosclerosis of aorta: Secondary | ICD-10-CM | POA: Diagnosis not present

## 2023-12-09 DIAGNOSIS — J449 Chronic obstructive pulmonary disease, unspecified: Secondary | ICD-10-CM | POA: Diagnosis present

## 2023-12-09 LAB — CBC WITH DIFFERENTIAL (CANCER CENTER ONLY)
Abs Immature Granulocytes: 0.15 K/uL — ABNORMAL HIGH (ref 0.00–0.07)
Basophils Absolute: 0 K/uL (ref 0.0–0.1)
Basophils Relative: 0 %
Eosinophils Absolute: 0 K/uL (ref 0.0–0.5)
Eosinophils Relative: 0 %
HCT: 29.7 % — ABNORMAL LOW (ref 39.0–52.0)
Hemoglobin: 10.3 g/dL — ABNORMAL LOW (ref 13.0–17.0)
Immature Granulocytes: 1 %
Lymphocytes Relative: 4 %
Lymphs Abs: 0.5 K/uL — ABNORMAL LOW (ref 0.7–4.0)
MCH: 35.3 pg — ABNORMAL HIGH (ref 26.0–34.0)
MCHC: 34.7 g/dL (ref 30.0–36.0)
MCV: 101.7 fL — ABNORMAL HIGH (ref 80.0–100.0)
Monocytes Absolute: 0.6 K/uL (ref 0.1–1.0)
Monocytes Relative: 5 %
Neutro Abs: 10.7 K/uL — ABNORMAL HIGH (ref 1.7–7.7)
Neutrophils Relative %: 90 %
Platelet Count: 9 K/uL — CL (ref 150–400)
RBC: 2.92 MIL/uL — ABNORMAL LOW (ref 4.22–5.81)
RDW: 14.6 % (ref 11.5–15.5)
WBC Count: 12 K/uL — ABNORMAL HIGH (ref 4.0–10.5)
nRBC: 0 % (ref 0.0–0.2)

## 2023-12-09 LAB — CMP (CANCER CENTER ONLY)
ALT: 21 U/L (ref 0–44)
AST: 17 U/L (ref 15–41)
Albumin: 4.2 g/dL (ref 3.5–5.0)
Alkaline Phosphatase: 111 U/L (ref 38–126)
Anion gap: 9 (ref 5–15)
BUN: 15 mg/dL (ref 8–23)
CO2: 28 mmol/L (ref 22–32)
Calcium: 9.5 mg/dL (ref 8.9–10.3)
Chloride: 99 mmol/L (ref 98–111)
Creatinine: 0.66 mg/dL (ref 0.61–1.24)
GFR, Estimated: >60 mL/min (ref >60)
Glucose, Bld: 126 mg/dL — ABNORMAL HIGH (ref 70–99)
Potassium: 4.1 mmol/L (ref 3.5–5.1)
Sodium: 135 mmol/L (ref 135–145)
Total Bilirubin: 0.3 mg/dL (ref 0.0–1.2)
Total Protein: 6.9 g/dL (ref 6.5–8.1)

## 2023-12-09 LAB — CBC WITH DIFFERENTIAL/PLATELET
Abs Immature Granulocytes: 0.24 K/uL — ABNORMAL HIGH (ref 0.00–0.07)
Basophils Absolute: 0 K/uL (ref 0.0–0.1)
Basophils Relative: 0 %
Eosinophils Absolute: 0 K/uL (ref 0.0–0.5)
Eosinophils Relative: 0 %
HCT: 29.3 % — ABNORMAL LOW (ref 39.0–52.0)
Hemoglobin: 10 g/dL — ABNORMAL LOW (ref 13.0–17.0)
Immature Granulocytes: 2 %
Lymphocytes Relative: 4 %
Lymphs Abs: 0.5 K/uL — ABNORMAL LOW (ref 0.7–4.0)
MCH: 35.8 pg — ABNORMAL HIGH (ref 26.0–34.0)
MCHC: 34.1 g/dL (ref 30.0–36.0)
MCV: 105 fL — ABNORMAL HIGH (ref 80.0–100.0)
Monocytes Absolute: 0.3 K/uL (ref 0.1–1.0)
Monocytes Relative: 3 %
Neutro Abs: 10.7 K/uL — ABNORMAL HIGH (ref 1.7–7.7)
Neutrophils Relative %: 91 %
Platelets: 10 K/uL — CL (ref 150–400)
RBC: 2.79 MIL/uL — ABNORMAL LOW (ref 4.22–5.81)
RDW: 14.6 % (ref 11.5–15.5)
Smear Review: NORMAL
WBC: 11.8 K/uL — ABNORMAL HIGH (ref 4.0–10.5)
nRBC: 0 % (ref 0.0–0.2)

## 2023-12-09 LAB — COMPREHENSIVE METABOLIC PANEL WITH GFR
ALT: 19 U/L (ref 0–44)
AST: 18 U/L (ref 15–41)
Albumin: 4 g/dL (ref 3.5–5.0)
Alkaline Phosphatase: 112 U/L (ref 38–126)
Anion gap: 11 (ref 5–15)
BUN: 16 mg/dL (ref 8–23)
CO2: 26 mmol/L (ref 22–32)
Calcium: 9.2 mg/dL (ref 8.9–10.3)
Chloride: 97 mmol/L — ABNORMAL LOW (ref 98–111)
Creatinine, Ser: 0.75 mg/dL (ref 0.61–1.24)
GFR, Estimated: >60 mL/min (ref >60)
Glucose, Bld: 198 mg/dL — ABNORMAL HIGH (ref 70–99)
Potassium: 4 mmol/L (ref 3.5–5.1)
Sodium: 134 mmol/L — ABNORMAL LOW (ref 135–145)
Total Bilirubin: 0.3 mg/dL (ref 0.0–1.2)
Total Protein: 6.6 g/dL (ref 6.5–8.1)

## 2023-12-09 LAB — TYPE AND SCREEN
ABO/RH(D): O POS
Antibody Screen: NEGATIVE

## 2023-12-09 LAB — PLATELET COUNT: Platelets: 18 K/uL — CL (ref 150–400)

## 2023-12-09 MED ORDER — SODIUM CHLORIDE 0.9% IV SOLUTION: Freq: Once | INTRAVENOUS | Status: DC

## 2023-12-09 MED ORDER — POLYETHYLENE GLYCOL 3350 17 G PO PACK: 17.0000 g | PACK | Freq: Every day | ORAL | Status: DC | PRN

## 2023-12-09 MED ORDER — AZITHROMYCIN 250 MG PO TABS
250.0000 mg | ORAL_TABLET | Freq: Every day | ORAL | Status: DC
  Administered 2023-12-10: 250 mg via ORAL
  Filled 2023-12-09: qty 1

## 2023-12-09 MED ORDER — ACETAMINOPHEN 650 MG RE SUPP: 650.0000 mg | Freq: Four times a day (QID) | RECTAL | Status: DC | PRN

## 2023-12-09 MED ORDER — OXYCODONE HCL 5 MG PO TABS: 5.0000 mg | ORAL_TABLET | ORAL | Status: DC | PRN

## 2023-12-09 MED ORDER — SODIUM CHLORIDE 0.9% FLUSH: 3.0000 mL | INTRAVENOUS | Status: DC | PRN

## 2023-12-09 MED ORDER — INSULIN ASPART 100 UNIT/ML IJ SOLN: 0.0000 [IU] | Freq: Three times a day (TID) | INTRAMUSCULAR | Status: DC

## 2023-12-09 MED ORDER — PREDNISONE 5 MG PO TABS
10.0000 mg | ORAL_TABLET | Freq: Every day | ORAL | Status: DC
Start: 2023-12-13 — End: 2023-12-15

## 2023-12-09 MED ORDER — HYDROCHLOROTHIAZIDE 12.5 MG PO TABS
12.5000 mg | ORAL_TABLET | Freq: Every day | ORAL | Status: DC
  Administered 2023-12-10: 12.5 mg via ORAL
  Filled 2023-12-09: qty 1

## 2023-12-09 MED ORDER — ONDANSETRON HCL 4 MG PO TABS: 8.0000 mg | ORAL_TABLET | Freq: Three times a day (TID) | ORAL | Status: DC | PRN

## 2023-12-09 MED ORDER — SODIUM CHLORIDE 0.9% FLUSH
3.0000 mL | Freq: Two times a day (BID) | INTRAVENOUS | Status: DC
  Administered 2023-12-09 – 2023-12-10 (×2): 3 mL via INTRAVENOUS

## 2023-12-09 MED ORDER — ONDANSETRON HCL 4 MG/2ML IJ SOLN: 4.0000 mg | Freq: Four times a day (QID) | INTRAMUSCULAR | Status: DC | PRN

## 2023-12-09 MED ORDER — SODIUM CHLORIDE 0.9 % IV SOLN: 250.0000 mL | INTRAVENOUS | Status: DC | PRN

## 2023-12-09 MED ORDER — DIPHENHYDRAMINE HCL 50 MG/ML IJ SOLN
50.0000 mg | Freq: Once | INTRAMUSCULAR | Status: AC | PRN
  Administered 2023-12-09: 50 mg via INTRAVENOUS
  Filled 2023-12-09: qty 1

## 2023-12-09 MED ORDER — SODIUM CHLORIDE 0.9% IV SOLUTION: Freq: Once | INTRAVENOUS | Status: AC

## 2023-12-09 MED ORDER — BUDESON-GLYCOPYRROL-FORMOTEROL 160-9-4.8 MCG/ACT IN AERO
2.0000 | INHALATION_SPRAY | Freq: Two times a day (BID) | RESPIRATORY_TRACT | Status: DC
  Administered 2023-12-10: 2 via RESPIRATORY_TRACT
  Filled 2023-12-09: qty 5.9

## 2023-12-09 MED ORDER — ONDANSETRON HCL 4 MG PO TABS: 4.0000 mg | ORAL_TABLET | Freq: Four times a day (QID) | ORAL | Status: DC | PRN

## 2023-12-09 MED ORDER — PREDNISONE 50 MG PO TABS
50.0000 mg | ORAL_TABLET | Freq: Three times a day (TID) | ORAL | Status: DC | PRN
  Administered 2023-12-09: 50 mg via ORAL
  Filled 2023-12-09: qty 1

## 2023-12-09 MED ORDER — IPRATROPIUM-ALBUTEROL 0.5-2.5 (3) MG/3ML IN SOLN: 3.0000 mL | Freq: Four times a day (QID) | RESPIRATORY_TRACT | Status: DC | PRN

## 2023-12-09 MED ORDER — BENAZEPRIL-HYDROCHLOROTHIAZIDE 20-25 MG PO TABS: 0.5000 | ORAL_TABLET | Freq: Every day | ORAL | Status: DC

## 2023-12-09 MED ORDER — ACETAMINOPHEN 325 MG PO TABS: 650.0000 mg | ORAL_TABLET | Freq: Four times a day (QID) | ORAL | Status: DC | PRN

## 2023-12-09 MED ORDER — BENAZEPRIL HCL 5 MG PO TABS
10.0000 mg | ORAL_TABLET | Freq: Every day | ORAL | Status: DC
  Administered 2023-12-10: 10 mg via ORAL
  Filled 2023-12-09: qty 2

## 2023-12-09 MED ORDER — PREDNISONE 20 MG PO TABS
20.0000 mg | ORAL_TABLET | Freq: Every day | ORAL | Status: DC
Start: 2023-12-11 — End: 2023-12-13

## 2023-12-09 MED ORDER — PREDNISONE 5 MG PO TABS
30.0000 mg | ORAL_TABLET | Freq: Every day | ORAL | Status: AC
  Administered 2023-12-10: 30 mg via ORAL
  Filled 2023-12-09: qty 2

## 2023-12-09 MED ORDER — IOHEXOL 300 MG/ML  SOLN
100.0000 mL | Freq: Once | INTRAMUSCULAR | Status: AC | PRN
  Administered 2023-12-09: 100 mL via INTRAVENOUS

## 2023-12-09 MED ORDER — FUROSEMIDE 20 MG PO TABS
20.0000 mg | ORAL_TABLET | Freq: Every day | ORAL | Status: DC
  Administered 2023-12-10: 20 mg via ORAL
  Filled 2023-12-09: qty 1

## 2023-12-09 NOTE — Assessment & Plan Note (Signed)
 Mild, trend ?

## 2023-12-09 NOTE — Assessment & Plan Note (Signed)
 Seen by Dr. Kassie on 2 days prior to admission and started on prednisone  and azithromycin , this will be continued while in the hospital.  He continues on Brextri and DuoNeb's as needed.

## 2023-12-09 NOTE — Assessment & Plan Note (Signed)
 On Eliquis , will need to discuss timing of the resumption of this once his platelets have recovered.

## 2023-12-09 NOTE — Assessment & Plan Note (Signed)
 Continue benazepril -HCTZ Continue Lasix 

## 2023-12-09 NOTE — Hospital Course (Signed)
 Patient is a 68 year old with COPD, HTN, HLD, A-flutter on Eliquis , remote history of prostate cancer, cholangiocarcinoma and current treatment.  He had labs done on today and was noted to have a platelet count of 9.  He was advised to come to the [facility] long ED for transfusion of platelets.  He received 1 unit and his platelet count improved to 18K.  Hematology was reached out to and they recommended observat Patient denies any significant bleeding or any other symptoms.  ion the patient overnight for transfusion of 1 more unit of platelets as well as holding his Eliquis .

## 2023-12-09 NOTE — Assessment & Plan Note (Signed)
Remote  

## 2023-12-09 NOTE — Assessment & Plan Note (Signed)
 Continue low-cholesterol diet

## 2023-12-09 NOTE — Assessment & Plan Note (Signed)
 Secondary to chemotherapy Status post 1 unit with a bump from 10-18 K Another 1 unit with repeat platelet count in the morning. Holding Eliquis 

## 2023-12-09 NOTE — ED Notes (Signed)
 Pt to stay in ED while awaiting CT scheduled in one hour

## 2023-12-09 NOTE — Assessment & Plan Note (Signed)
 Her follow-up CT that was supposed to be done as an outpatient today. The patient has allergy to contrast and was supposed to take 13-hour prep prior to having this done. Since the patient is here we will proceed with repeat CT with contrast of abdomen pelvis and chest per oncology with 13-hour premedication of 50 mg of prednisone  an hour 13, 7 and 1 before the CT with the addition of 50 mg of IV Benadryl .

## 2023-12-09 NOTE — H&P (Signed)
 History and Physical    Patient: Robert [name] FMW:[id number] DOB: 02/21/1955 DOA: 12/09/2023 DOS: the patient was seen and examined on 12/09/2023 PCP: Omar Cumins, FNP  Patient coming from: Home  Chief Complaint:  Chief Complaint  Patient presents with   Abnormal Lab   HPI: Robert [name] is a 68 y.o. male with medical history significant of COPD, HTN, HLD, A-flutter on Eliquis , remote history of prostate cancer, cholangiocarcinoma and current treatment.  He had labs done on today and was noted to have a platelet count of 9.  He was advised to come to the [facility] long ED for transfusion of platelets.  He received 1 unit and his platelet count improved to 18K.  Hematology was reached out to and they recommended observation the patient overnight for transfusion of 1 more unit of platelets as well as holding his Eliquis .  Patient denies any significant bleeding or any other symptoms.    Review of Systems: As mentioned in the history of present illness. All other systems reviewed and are negative. 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   Cancer (HCC)    COPD (chronic obstructive pulmonary disease) (HCC)    Elevated hemoglobin A1c    Elevated PSA 03/16/2016   Hypertension    Personal history of colonic adenomas 12/28/2007   Vitamin D  deficiency    Past Surgical History:  Procedure Laterality Date   BILIARY BRUSHING  05/06/2023   Procedure: BRUSH BIOPSY, BILE DUCT;  Surgeon: Wilhelmenia Aloha Raddle., MD;  Location: [facility] ENDOSCOPY;  Service: Gastroenterology;;   BILIARY STENT PLACEMENT  05/06/2023   Procedure: INSERTION, STENT, BILE DUCT;  Surgeon: Wilhelmenia Aloha Raddle., MD;  Location: [facility] ENDOSCOPY;  Service: Gastroenterology;;   BIOPSY OF SKIN SUBCUTANEOUS TISSUE AND/OR MUCOUS MEMBRANE  05/06/2023   Procedure: BIOPSY, SKIN, SUBCUTANEOUS TISSUE, OR MUCOUS MEMBRANE;  Surgeon: Wilhelmenia Aloha Raddle., MD;  Location: [facility] ENDOSCOPY;   Service: Gastroenterology;;   COLONOSCOPY  2009   CG  hems, TAs   ERCP N/A 05/06/2023   Procedure: ERCP, WITH INTERVENTION IF INDICATED;  Surgeon: Wilhelmenia Aloha Raddle., MD;  Location[facility] ENDOSCOPY;  Service: Gastroenterology;  Laterality: N/A;   ERCP N/A 11/07/2023   Procedure: ERCP, WITH INTERVENTION IF INDICATED;  Surgeon: Wilhelmenia Aloha Raddle., MD;  Location: [facility] ENDOSCOPY;  Service: Gastroenterology;  Laterality: N/A;   ESOPHAGOGASTRODUODENOSCOPY N/A 06/16/2023   Procedure: EGD (ESOPHAGOGASTRODUODENOSCOPY);  Surgeon: Wilhelmenia Aloha Raddle., MD;  Location: [facility] ENDOSCOPY;  Service: Gastroenterology;  Laterality: N/A;   IR IMAGING GUIDED PORT INSERTION  06/10/2023   LYMPHADENECTOMY N/A 06/28/2016   Procedure: LYMPHADENECTOMY;  Surgeon: Renda Glance, MD;  Location: [facility] ORS;  Service: Urology;  Laterality: N/A;   PANCREATIC STENT PLACEMENT  05/06/2023   Procedure: INSERTION, STENT, PANCREATIC DUCT;  Surgeon: Wilhelmenia Aloha Raddle., MD;  Location: [facility] ENDOSCOPY;  Service: Gastroenterology;;   PROSTATE BIOPSY  03/16/2016   Surgical Pathology Gross and Microscopic Examination for Prostate Needle   ROBOT ASSISTED LAPAROSCOPIC RADICAL PROSTATECTOMY N/A 06/28/2016   Procedure: XI ROBOTIC ASSISTED LAPAROSCOPIC RADICAL PROSTATECTOMY LEVEL 2 EXCISION OF PENILE LESION;  Surgeon: Renda Glance, MD;  Location: [facility] ORS;  Service: Urology;  Laterality: N/A;   Social History:  reports that he has been smoking cigarettes. He has a 20 pack-year smoking history. He has been exposed to tobacco smoke. He has never used smokeless tobacco. He reports current alcohol use of about 4.0 standard drinks of alcohol per week. He reports that he does  not use drugs.  Allergies  Allergen Reactions   Codeine Itching   Isovue  [Iopamidol ] Hives    Pt. Developed 1 hive after injection; Dr. Graham spoke with patient;recommends 13 hr prep in the future    Family History  Problem Relation Age of Onset   Lung cancer Mother     Heart disease Brother    Cancer Maternal Aunt        NOS   Lung disease Neg Hx    Colon cancer Neg Hx    Rectal cancer Neg Hx    Stomach cancer Neg Hx     Prior to Admission medications   Medication Sig Start Date End Date Taking? Authorizing Provider  albuterol  (VENTOLIN  HFA) 108 (90 Base) MCG/ACT inhaler Inhale 2 puffs into the lungs every 6 (six) hours as needed for wheezing or shortness of breath. 02/17/22  Yes Sira, Zackery, MD  apixaban  (ELIQUIS ) 5 MG TABS tablet Take 1 tablet (5 mg total) by mouth 2 (two) times daily. 11/08/23  Yes Debby Olam POUR, NP  azithromycin  (ZITHROMAX ) 250 MG tablet Take two tablets on day 1, then one tablet daily on day 2-5. 12/07/23  Yes Kassie Acquanetta Bradley, MD  benazepril -hydrochlorthiazide (LOTENSIN  HCT) 20-25 MG tablet Take 0.5 tablets by mouth daily. 11/04/23  Yes Vannie Reche RAMAN, NP  BREZTRI  AEROSPHERE 160-9-4.8 MCG/ACT AERO inhaler Inhale 2 puffs into the lungs 2 (two) times daily. 04/22/23  Yes [provider]  furosemide  (LASIX ) 20 MG tablet Take 1 tablet (20 mg total) by mouth daily. 11/04/23 02/02/24 Yes Vannie Reche RAMAN, NP  ipratropium-albuterol  (DUONEB) 0.5-2.5 (3) MG/3ML SOLN Take 3 mLs by nebulization 2 (two) times daily as needed. 12/07/23  Yes Kassie Acquanetta Bradley, MD  ondansetron  (ZOFRAN ) 8 MG tablet Take 1 tablet (8 mg total) by mouth every 8 (eight) hours as needed (May use 3 days after chemo as needed for nausea). 06/08/23  Yes Cloretta Arley NOVAK, MD  predniSONE  (DELTASONE ) 10 MG tablet Take 4 tablets (40 mg total) by mouth daily with breakfast for 2 days, THEN 3 tablets (30 mg total) daily with breakfast for 2 days, THEN 2 tablets (20 mg total) daily with breakfast for 2 days, THEN 1 tablet (10 mg total) daily with breakfast for 2 days. 12/07/23 12/15/23 Yes Kassie Acquanetta Bradley, MD  predniSONE  (DELTASONE ) 50 MG tablet Take 50 mg by mouth as directed. For CT prep 09/26/23  Yes [provider]  OXYGEN  Inhale 3 L into the lungs as needed  (as directed). Patient not taking: No sig reported    [provider]    Physical Exam: Vitals:   12/09/23 1617 12/09/23 1730 12/09/23 1735 12/09/23 1830  BP: (!) 174/85 (!) 182/85  (!) 187/82  Pulse: 63 (!) 54 60 (!) 56  Resp: 18 18  18   Temp: 98.5 F (36.9 C) 98.5 F (36.9 C)    TempSrc:      SpO2: 98% 98% 98% 98%  Weight:      Height:       Physical Examination: General appearance - alert, well appearing, and in no distress Neck - supple, no significant adenopathy Chest - clear to auscultation, no wheezes, rales or rhonchi, symmetric air entry Heart - normal rate, regular rhythm, normal S1, S2, no murmurs, rubs, clicks or gallops Abdomen - soft, nontender, nondistended, no masses or organomegaly Extremities - peripheral pulses normal, no pedal edema, no clubbing or cyanosis Skin - multiple ecchymoses  Data Reviewed: Results for orders placed or performed during  the hospital encounter of 12/09/23 (from the past 24 hours)  Type and screen [facility]     Status: None   Collection Time: 12/09/23  2:05 PM  Result Value Ref Range   ABO/RH(D) O POS    Antibody Screen NEG    Sample Expiration      12/12/2023,2359 Performed at [facility], [address] [place], [place] [address]   CBC with Differential     Status: Abnormal   Collection Time: 12/09/23  2:18 PM  Result Value Ref Range   WBC 11.8 (H) 4.0 - 10.5 K/uL   RBC 2.79 (L) 4.22 - 5.81 MIL/uL   Hemoglobin 10.0 (L) 13.0 - 17.0 g/dL   HCT 70.6 (L) 60.9 - 47.9 %   MCV 105.0 (H) 80.0 - 100.0 fL   MCH 35.8 (H) 26.0 - 34.0 pg   MCHC 34.1 30.0 - 36.0 g/dL   RDW 85.3 88.4 - 84.4 %   Platelets 10 (LL) 150 - 400 K/uL   nRBC 0.0 0.0 - 0.2 %   Neutrophils Relative % 91 %   Neutro Abs 10.7 (H) 1.7 - 7.7 K/uL   Lymphocytes Relative 4 %   Lymphs Abs 0.5 (L) 0.7 - 4.0 K/uL   Monocytes Relative 3 %   Monocytes Absolute 0.3 0.1 - 1.0 K/uL   Eosinophils Relative 0 %   Eosinophils  Absolute 0.0 0.0 - 0.5 K/uL   Basophils Relative 0 %   Basophils Absolute 0.0 0.0 - 0.1 K/uL   WBC Morphology MORPHOLOGY UNREMARKABLE    RBC Morphology MORPHOLOGY UNREMARKABLE    Smear Review Normal platelet morphology    Immature Granulocytes 2 %   Abs Immature Granulocytes 0.24 (H) 0.00 - 0.07 K/uL  Comprehensive metabolic panel     Status: Abnormal   Collection Time: 12/09/23  2:18 PM  Result Value Ref Range   Sodium 134 (L) 135 - 145 mmol/L   Potassium 4.0 3.5 - 5.1 mmol/L   Chloride 97 (L) 98 - 111 mmol/L   CO2 26 22 - 32 mmol/L   Glucose, Bld 198 (H) 70 - 99 mg/dL   BUN 16 8 - 23 mg/dL   Creatinine, Ser 9.24 0.61 - 1.24 mg/dL   Calcium  9.2 8.9 - 10.3 mg/dL   Total Protein 6.6 6.5 - 8.1 g/dL   Albumin 4.0 3.5 - 5.0 g/dL   AST 18 15 - 41 U/L   ALT 19 0 - 44 U/L   Alkaline Phosphatase 112 38 - 126 U/L   Total Bilirubin 0.3 0.0 - 1.2 mg/dL   GFR, Estimated >39 >39 mL/min   Anion gap 11 5 - 15  Prepare platelet pheresis     Status: None (Preliminary result)   Collection Time: 12/09/23  2:52 PM  Result Value Ref Range   Unit Number [id number]    Blood Component Type PLTP1 PSORALEN TREATED    Unit division 00    Status of Unit ISSUED    Transfusion Status      OK TO TRANSFUSE Performed at [facility], [address], [place], [place] [address]   Platelet count     Status: Abnormal   Collection Time: 12/09/23  6:00 PM  Result Value Ref Range   Platelets 18 (LL) 150 - 400 K/uL     Assessment and Plan: * Thrombocytopenia Secondary to chemotherapy Status post 1 unit with a bump from 10-18 K Another 1 unit with repeat platelet count in the  morning. Holding Eliquis   COPD exacerbation (HCC) Seen by Dr. Kassie on 2 days prior to admission and started on prednisone  and azithromycin , this will be continued while in the hospital.  He continues on Brextri and DuoNeb's as needed.  Hyponatremia Mild, trend  Tobacco abuse Patient was offered and  declined nicotine  replacement.  Atrial flutter (HCC) On Eliquis , will need to discuss timing of the resumption of this once his platelets have recovered.  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 The patient has allergy to contrast and was supposed to take 13-hour prep prior to having this done. Since the patient is here we will proceed with repeat CT with contrast of abdomen pelvis and chest per oncology with 13-hour premedication of 50 mg of prednisone  an hour 13, 7 and 1 before the CT with the addition of 50 mg of IV Benadryl .  History of prostate cancer Remote  Hyperlipidemia, mixed Continue low-cholesterol diet  Essential hypertension Continue benazepril -HCTZ Continue Lasix       Advance Care Planning:   Code Status: Prior full  Consults: Oncology via phone  Family Communication: Patient at bedside  Severity of Illness: The appropriate patient status for this patient is OBSERVATION. Observation status is judged to be reasonable and necessary in order to provide the required intensity of service to ensure the patient's safety. The patient's presenting symptoms, physical exam findings, and initial radiographic and laboratory data in the context of their medical condition is felt to place them at decreased risk for further clinical deterioration. Furthermore, it is anticipated that the patient will be medically stable for discharge from the hospital within 2 midnights of admission.   Author: Glenys GORMAN Birk, MD 12/09/2023 7:35 PM  For on call review [email or web address].

## 2023-12-09 NOTE — ED Triage Notes (Signed)
 Patient was called that his platelets were low and he needs a blood transfusion. 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 Denies dizziness, light headedness any symptoms.

## 2023-12-09 NOTE — Telephone Encounter (Signed)
 Patient coming in today for lab and CT scan as scheduled.  His CBC showed a platelet of 9K today.  I called patient, he denies any significant bleeding.  Unfortunately our office is closed, and we are not able to arrange platelet transfusion in the office today or tomorrow.  I told patient go to [facility] emergency room this afternoon, for platelet transfusion in the ED.  He will need type and cross first, he never had a blood transfusion before.  He is scheduled for CT scan at 330 this afternoon, hopefully he can get that done also.  All questions were answered.  I will copy his primary oncologist Dr. Cloretta.  Onita Mattock MD 12/09/2023

## 2023-12-09 NOTE — ED Notes (Signed)
 Blood bank has blood ready for this patient.  Notified Mandi,RN.

## 2023-12-09 NOTE — Discharge Instructions (Addendum)
 Stop Eliquis !   You were seen in the emergency room today for thrombocytopenia or a low platelet count.  This is likely due to your cancer treatment.  You did receive 1 unit of platelets while you are here in the emergency room.  Oncology is okay with follow up outpatient. Please follow-up closely with your oncologist and return to emergency room with any new or worsening symptoms.

## 2023-12-09 NOTE — ED Provider Notes (Cosign Needed Addendum)
 [place] EMERGENCY DEPARTMENT AT [facility] Provider Note   CSN: [id number] Arrival date & time: 12/09/23  1342     Patient presents with: Abnormal Lab   Robert [name] is a 68 y.o. 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  Patient reports he was scheduled for routine lab work today and found to have low platelets.  He was recommended here to have transfusion.  He denies any bleeding, dark stools.  He denies any symptoms associated with this.    Abnormal Lab      Prior to Admission medications   Medication Sig Start Date End Date Taking? Authorizing Provider  albuterol  (VENTOLIN  HFA) 108 (90 Base) MCG/ACT inhaler Inhale 2 puffs into the lungs every 6 (six) hours as needed for wheezing or shortness of breath. 02/17/22   Sira, Zackery, MD  apixaban  (ELIQUIS ) 5 MG TABS tablet Take 1 tablet (5 mg total) by mouth 2 (two) times daily. 11/08/23   Debby Olam POUR, NP  azithromycin  (ZITHROMAX ) 250 MG tablet Take two tablets on day 1, then one tablet daily on day 2-5. 12/07/23   Kassie Acquanetta Bradley, MD  benazepril -hydrochlorthiazide (LOTENSIN  HCT) 20-25 MG tablet Take 0.5 tablets by mouth daily. 11/04/23   Vannie Reche RAMAN, NP  BREZTRI  AEROSPHERE 160-9-4.8 MCG/ACT AERO inhaler Inhale 2 puffs into the lungs 2 (two) times daily. 04/22/23   [provider]  furosemide  (LASIX ) 20 MG tablet Take 1 tablet (20 mg total) by mouth daily. 11/04/23 02/02/24  Walker, Caitlin S, NP  ipratropium-albuterol  (DUONEB) 0.5-2.5 (3) MG/3ML SOLN Take 3 mLs by nebulization 2 (two) times daily as needed. 12/07/23   Kassie Acquanetta Bradley, MD  ondansetron  (ZOFRAN ) 8 MG tablet Take 1 tablet (8 mg total) by mouth every 8 (eight) hours as needed (May use 3 days after chemo as needed for nausea). Patient not taking: Reported on  12/07/2023 06/08/23   Cloretta Arley NOVAK, MD  OXYGEN  Inhale 3 L into the lungs as needed (as directed). Patient not taking: Reported on 12/07/2023    [provider]  predniSONE  (DELTASONE ) 10 MG tablet Take 4 tablets (40 mg total) by mouth daily with breakfast for 2 days, THEN 3 tablets (30 mg total) daily with breakfast for 2 days, THEN 2 tablets (20 mg total) daily with breakfast for 2 days, THEN 1 tablet (10 mg total) daily with breakfast for 2 days. 12/07/23 12/15/23  Kassie Acquanetta Bradley, MD  predniSONE  (DELTASONE ) 50 MG tablet Take 50 mg by mouth as directed. For CT prep 09/26/23   [provider]    Allergies: Codeine and Isovue  [iopamidol ]    Review of Systems  Constitutional:  Positive for activity change.    Updated Vital Signs BP (!) 162/64 (BP Location: Left Arm)   Pulse 68   Temp 98 F (36.7 C) (Oral)   Resp 16   Ht 5' 6 (1.676 m)   Wt 98 kg   SpO2 99%   BMI 34.87 kg/m   Physical Exam Vitals and nursing note reviewed.  Constitutional:      General: He is not in acute distress.    Appearance: He is not toxic-appearing.  HENT:     Head: Normocephalic and atraumatic.  Eyes:     General: No scleral icterus.    Conjunctiva/sclera: Conjunctivae normal.  Cardiovascular:     Rate and Rhythm: Normal rate and regular  rhythm.     Pulses: Normal pulses.     Heart sounds: Normal heart sounds.  Pulmonary:     Effort: Pulmonary effort is normal. No respiratory distress.     Breath sounds: Normal breath sounds.  Abdominal:     General: Abdomen is flat. Bowel sounds are normal.     Palpations: Abdomen is soft.     Tenderness: There is no abdominal tenderness.  Musculoskeletal:     Right lower leg: No edema.     Left lower leg: No edema.  Skin:    General: Skin is warm and dry.     Findings: No lesion.  Neurological:     General: No focal deficit present.     Mental Status: He is alert and oriented to person, place, and time. Mental status is at baseline.      (all labs ordered are listed, but only abnormal results are displayed) Labs Reviewed  COMPREHENSIVE METABOLIC PANEL WITH GFR - Abnormal; Notable for the following components:      Result Value   Sodium 134 (*)    Chloride 97 (*)    Glucose, Bld 198 (*)    All other components within normal limits  CBC WITH DIFFERENTIAL/PLATELET  TYPE AND SCREEN    EKG: None  Radiology: No results found.   .Critical Care  Performed by: Shermon Warren SAILOR, PA-C Authorized by: Shermon Warren SAILOR, PA-C   Critical care provider statement:    Critical care time (minutes):  45   Critical care was necessary to treat or prevent imminent or life-threatening deterioration of the following conditions:  Circulatory failure (Requiring transfusion)   Critical care was time spent personally by me on the following activities:  Development of treatment plan with patient or surrogate, discussions with consultants, evaluation of patient's response to treatment, examination of patient, ordering and review of laboratory studies, ordering and review of radiographic studies, ordering and performing treatments and interventions, pulse oximetry, re-evaluation of patient's condition and review of old charts    Medications Ordered in the ED - No data to display  Clinical Course as of 12/09/23 1932  Fri Dec 09, 2023  1647 Pre medicated for CT scan at 4:45p w/ 50mg  pred and 50mg  benadryl .  [JB]    Clinical Course User Index [JB] Nupur Hohman, Warren SAILOR, PA-C                                 Medical Decision Making Amount and/or Complexity of Data Reviewed Labs: ordered.  Risk Prescription drug management. Decision regarding hospitalization.   This patient presents to the ED for concern of plt, this involves an extensive number of treatment options, and is a complaint that carries with it a high risk of complications and morbidity.  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  The pertinent results include:   CBC with leukocytosis at 11.8, hemoglobin is 10 which seems stable from prior recent labs, PLT are 10,000  Recheck of platelets after 1 unit show 18,000    Cardiac Monitoring: / EKG:  The patient was maintained on a cardiac monitor.  Consultations Obtained:  I requested consultation with the Dr Lanny Oncology,  and discussed lab and imaging findings as well as pertinent plan - they recommend: 1U plt transfusion and than discharge home. No need for admission in regards to plts.  Repaged Dr. Lanny who recommended admission into the hospital for observation and platelet recheck in the morning as well as repeat 1 unit of platelets. I consulted the hospital team for admission who agrees to see patient.    Problem List / ED Course / Critical interventions / Medication management  Patient presents to emergency room with complaint of thrombocytopenia.  He denies any bleeding.  He did admit to taking Eliquis  this morning.  He otherwise denies any symptoms.  He does have a history of cancer and his last treatment of chemotherapy was on Wednesday.  Patient was consented for thrombocytopenia transfusion.  I ordered medication including 2U platelets   Reevaluation of the patient after these medicines showed that the patient stayed the same I have reviewed the patients home medicines and have made adjustments as needed Given that patient has not had appropriate response to receiving platelet transfusion here it was recommended that he were admitted to the hospital for repeat unit and  observation.        Final diagnoses:  Thrombocytopenia    ED Discharge Orders     None          Shermon Warren SAILOR, PA-C 12/09/23 1938    Shermon Warren SAILOR, PA-C 12/09/23 1939    Kammerer, Duwaine CROME, DO 12/10/23 [phone]

## 2023-12-09 NOTE — Telephone Encounter (Signed)
 Critical Lab Value Reported x [facility]. Platelets Value: 9 MD notified.

## 2023-12-09 NOTE — Assessment & Plan Note (Signed)
Patient was offered and declined nicotine replacement.

## 2023-12-10 ENCOUNTER — Encounter: Payer: Self-pay | Admitting: Oncology

## 2023-12-10 DIAGNOSIS — D696 Thrombocytopenia, unspecified: Secondary | ICD-10-CM | POA: Diagnosis not present

## 2023-12-10 LAB — CBC
HCT: 29.6 % — ABNORMAL LOW (ref 39.0–52.0)
Hemoglobin: 9.9 g/dL — ABNORMAL LOW (ref 13.0–17.0)
MCH: 35.1 pg — ABNORMAL HIGH (ref 26.0–34.0)
MCHC: 33.4 g/dL (ref 30.0–36.0)
MCV: 105 fL — ABNORMAL HIGH (ref 80.0–100.0)
Platelets: 42 K/uL — ABNORMAL LOW (ref 150–400)
RBC: 2.82 MIL/uL — ABNORMAL LOW (ref 4.22–5.81)
RDW: 14.7 % (ref 11.5–15.5)
WBC: 10 K/uL (ref 4.0–10.5)
nRBC: 0 % (ref 0.0–0.2)

## 2023-12-10 LAB — GLUCOSE, CAPILLARY: Glucose-Capillary: 107 mg/dL — ABNORMAL HIGH (ref 70–99)

## 2023-12-10 MED ORDER — CHLORHEXIDINE GLUCONATE CLOTH 2 % EX PADS
6.0000 | MEDICATED_PAD | Freq: Every day | CUTANEOUS | Status: DC
  Administered 2023-12-10: 6 via TOPICAL

## 2023-12-10 MED ORDER — SODIUM CHLORIDE 0.9% FLUSH: 10.0000 mL | INTRAVENOUS | Status: DC | PRN

## 2023-12-10 MED ORDER — SODIUM CHLORIDE 0.9% FLUSH
10.0000 mL | Freq: Two times a day (BID) | INTRAVENOUS | Status: DC
  Administered 2023-12-10: 10 mL

## 2023-12-10 MED ORDER — HEPARIN SOD (PORK) LOCK FLUSH 100 UNIT/ML IV SOLN
500.0000 [IU] | INTRAVENOUS | Status: AC | PRN
  Administered 2023-12-10: 500 [IU]
  Filled 2023-12-10: qty 5

## 2023-12-10 NOTE — Plan of Care (Signed)

## 2023-12-10 NOTE — TOC Initial Note (Signed)
 Transition of Care [facility]) - Initial/Assessment Note    Patient Details  Name: Robert [name] MRN: [id number] Date of Birth: Aug 30, 1955  Transition of Care [facility]) CM/SW Contact:    Robert Manuella Quill, RN Phone Number: 12/10/2023, 10:28 AM  Clinical Narrative:                 Spoke w/ pt and Robert [name] (cousin) [phone] in room; pt said he lives at home; he plans to return w/ her support at d/c; she will provide transportation; pt verified insurance/PCP; he denied SDOH risks; he does not have DME or HH services; pt said he has home oxygen  w/ [employer], and he does not need travel tank; no IP CM needs.  Expected Discharge Plan: Home/Self Care Barriers to Discharge: No Barriers Identified   Patient Goals and CMS Choice Patient states their goals for this hospitalization and ongoing recovery are:: home     [employer] ownership interest in [facility].provided to:: Patient    Expected Discharge Plan and Services   Discharge Planning Services: CM Consult   Living arrangements for the past 2 months: Single Family Home Expected Discharge Date: 12/10/23               DME Arranged: N/A DME Agency: NA       HH Arranged: NA HH Agency: NA        Prior Living Arrangements/Services Living arrangements for the past 2 months: Single Family Home Lives with:: Relatives Patient language and need for interpreter reviewed:: Yes Do you feel safe going back to the place where you live?: Yes      Need for Family Participation in Patient Care: Yes (Comment) Care giver support system in place?: Yes (comment) Current home services: DME (home oxygen  w/ [employer]) Criminal Activity/Legal Involvement Pertinent to Current Situation/Hospitalization: No - Comment as needed  Activities of Daily Living   ADL Screening (condition at time of admission) Independently performs ADLs?: Yes (appropriate for developmental age) Is the patient deaf or have difficulty hearing?: No Does the patient  have difficulty seeing, even when wearing glasses/contacts?: No Does the patient have difficulty concentrating, remembering, or making decisions?: No  Permission Sought/Granted Permission sought to share information with : Case Manager Permission granted to share information with : Yes, Verbal Permission Granted  Share Information with NAME: Case Manager     Permission granted to share info w Relationship: Robert [name] (cousin) [phone]     Emotional Assessment Appearance:: Appears stated age Attitude/Demeanor/Rapport: Gracious Affect (typically observed): Accepting Orientation: : Oriented to Self, Oriented to Place, Oriented to  Time, Oriented to Situation Alcohol / Substance Use: Not Applicable Psych Involvement: No (comment)  Admission diagnosis:  Thrombocytopenia [D69.6] Patient Active Problem List   Diagnosis Date Noted   Thrombocytopenia 12/09/2023   Atrial flutter (HCC) 12/09/2023   Cholangiocarcinoma (HCC) 11/07/2023   BRCA2 gene mutation positive 07/01/2023   Genetic testing 07/01/2023   Duodenal ulcer 06/16/2023   History of biliary duct stent placement 06/16/2023   Encounter for removal of pancreatic stent 06/16/2023   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   Erosive gastritis 05/06/2023   Biliary obstruction (HCC) 05/06/2023   Obstructive jaundice (HCC) 05/02/2023   COPD exacerbation (HCC) 02/15/2022   Sepsis due to undetermined organism (HCC) 02/15/2022   Hyponatremia 02/15/2022   FH: hypertension 09/19/2017   Prediabetes 07/22/2016   Tobacco abuse    History of prostate cancer 05/20/2016   Obesity (BMI 35.55) 03/19/2013   Testosterone  deficiency 12/05/2012  Essential hypertension    Hyperlipidemia, mixed    History of colonic polyps 12/28/2007   PCP:  Omar Cumins, FNP Pharmacy:   [facility] [id number] - [address] [place] - [address] [facility] [address] [place] [address] Phone: [phone] Fax:  [phone]  [place] [facility] [address], [address] [place] [address] Phone: [phone] Fax: [phone]     Social Drivers of Health (SDOH) Social History: SDOH Screenings   Food Insecurity: No Food Insecurity (12/10/2023)  Housing: Low Risk  (12/10/2023)  Transportation Needs: No Transportation Needs (12/10/2023)  Utilities: Not At Risk (12/10/2023)  Depression (PHQ2-9): Low Risk  (11/30/2023)  Social Connections: Socially Isolated (12/09/2023)  Tobacco Use: High Risk (12/09/2023)   SDOH Interventions: Food Insecurity Interventions: Intervention Not Indicated, Inpatient TOC Housing Interventions: Intervention Not Indicated, Inpatient TOC Transportation Interventions: Intervention Not Indicated, Inpatient TOC Utilities Interventions: Intervention Not Indicated, Inpatient TOC   Readmission Risk Interventions     No data to display

## 2023-12-10 NOTE — Plan of Care (Signed)
  Problem: Coping: Goal: Ability to adjust to condition or change in health will improve Outcome: Progressing   Problem: Fluid Volume: Goal: Ability to maintain a balanced intake and output will improve Outcome: Progressing   Problem: Nutritional: Goal: Maintenance of adequate nutrition will improve Outcome: Progressing   Problem: Skin Integrity: Goal: Risk for impaired skin integrity will decrease Outcome: Progressing   Problem: Tissue Perfusion: Goal: Adequacy of tissue perfusion will improve Outcome: Progressing   Problem: Education: Goal: Knowledge of General Education information will improve Description: Including pain rating scale, medication(s)/side effects and non-pharmacologic comfort measures Outcome: Progressing   Problem: Health Behavior/Discharge Planning: Goal: Ability to manage health-related needs will improve Outcome: Progressing

## 2023-12-10 NOTE — Discharge Summary (Addendum)
 Physician Discharge Summary  Robert [name] FMW:[id number] DOB: [birth year] DOA: 12/09/2023  PCP: Omar Cumins, FNP  Admit date: 12/09/2023 Discharge date: 12/10/2023  Admitted From: Home Disposition:  Home  Discharge Condition:Stable CODE STATUS:FULL Diet recommendation: Heart Healthy   Brief/Interim Summary: Robert [name] is a 68 y.o. male with medical history significant of COPD, HTN, HLD, A-flutter on Eliquis , remote history of prostate cancer, cholangiocarcinoma and current treatment.  He had labs done on today and was noted to have a platelet count of 9.  He was advised to come to the [facility] long ED for transfusion of platelets.  Hematology was reached out to and they recommended observation the patient overnight for transfusion of 1 more unit of platelets as well as holding his Eliquis  .  He received platelets transfusion.  Creatinine stable in the range of 42,000 today.  He feels great this morning.  Eager to go home.  Medically stable for discharge.  Following problems were addressed during the hospitalization:  Thrombocytopenia Secondary to chemotherapy Given platelet transfusion.  Platelets stable in the range of 42,000 today.  Check CBC in a week  COPD exacerbation  Seen by Dr. Kassie on 2 days prior to admission and started on prednisone  and azithromycin , this will be continued .  He continues on Brextri and DuoNeb's as needed.   Hyponatremia Mild, continue monitoring as an outpatient   Tobacco abuse Patient was offered and declined nicotine  replacement.   Atrial flutter On Eliquis  at home for anticoagulation.  Currently in normal sinus rhythm. Platelets level of 42,000 today.  I have alerted,Dr Lanny, her oncologist about this.  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  Check CBC during the follow-up to check your platelets level   Increase activity slowly   Complete by: As directed       Allergies as of 12/10/2023       Reactions   Codeine Itching   Isovue  [iopamidol ] Hives   Pt. Developed 1 hive after injection; Dr. Graham spoke with patient;recommends 13 hr prep in the future        Medication List     PAUSE taking these medications    Eliquis  5 MG Tabs tablet Wait to take this until: December 14, 2023 Generic drug: apixaban  Take 1 tablet (5 mg total) by mouth 2 (two) times daily.       TAKE these medications    albuterol  108 (90 Base) MCG/ACT inhaler Commonly known as: VENTOLIN  HFA Inhale 2 puffs into the lungs every 6 (six) hours as needed for wheezing or shortness of breath.   azithromycin  250 MG tablet  Commonly known as: ZITHROMAX  Take two tablets on day 1, then one tablet daily on day 2-5.   benazepril -hydrochlorthiazide 20-25 MG tablet Commonly known as: LOTENSIN  HCT Take 0.5 tablets by mouth daily.   Breztri  Aerosphere 160-9-4.8 MCG/ACT Aero inhaler Generic drug: budesonide -glycopyrrolate -formoterol  Inhale 2 puffs into the lungs 2 (two) times daily.   furosemide  20 MG tablet Commonly known as:  LASIX  Take 1 tablet (20 mg total) by mouth daily.   ipratropium-albuterol  0.5-2.5 (3) MG/3ML Soln Commonly known as: DUONEB Take 3 mLs by nebulization 2 (two) times daily as needed.   ondansetron  8 MG tablet Commonly known as: ZOFRAN  Take 1 tablet (8 mg total) by mouth every 8 (eight) hours as needed (May use 3 days after chemo as needed for nausea).   OXYGEN  Inhale 3 L into the lungs as needed (as directed).   predniSONE  50 MG tablet Commonly known as: DELTASONE  Take 50 mg by mouth as directed. For CT prep   predniSONE  10 MG tablet Commonly known as: DELTASONE  Take 4 tablets (40 mg total) by mouth daily with breakfast for 2 days, THEN 3 tablets (30 mg total) daily with breakfast for 2 days, THEN 2 tablets (20 mg total) daily with breakfast for 2 days, THEN 1 tablet (10 mg total) daily with breakfast for 2 days. Start taking on: December 07, 2023        Follow-up Information     Omar Cumins, FNP .   Specialty: Pain Medicine Contact information: [address] [place] [address] [place] [address] [phone]                Allergies  Allergen Reactions   Codeine Itching   Isovue  [Iopamidol ] Hives    Pt. Developed 1 hive after injection; Dr. Graham spoke with patient;recommends 13 hr prep in the future    Consultations: None   Procedures/Studies: CT CHEST ABDOMEN PELVIS W CONTRAST Result Date: 12/09/2023 CLINICAL DATA:  Gallbladder cancer assessment.  Treatment response. EXAM: CT CHEST, ABDOMEN, AND PELVIS WITH CONTRAST TECHNIQUE: Multidetector CT imaging of the chest, abdomen and pelvis was performed following the standard protocol during bolus administration of intravenous contrast. RADIATION DOSE REDUCTION: This exam was performed according to the departmental dose-optimization program which includes automated exposure control, adjustment of the mA and/or kV according to patient size and/or use of iterative reconstruction technique. CONTRAST:  OMNIPAQUE   IOHEXOL  300 MG/ML  SOLN COMPARISON:  CT abdomen and pelvis 07/20/2023.  CT chest 05/06/2023. FINDINGS: CT CHEST FINDINGS Cardiovascular: No significant vascular findings. The heart is mildly enlarged. No pericardial effusion. Right chest port catheter tip ends in the superior vena cava. Mediastinum/Nodes: No enlarged mediastinal, hilar, or axillary lymph nodes. Thyroid  gland, trachea, and esophagus demonstrate no significant findings. Lungs/Pleura: Lungs are clear. There is a new 6 mm nodular density in the left lower lobe image 5/19. 3 mm left lower lobe nodule image 5/189 is stable. There are bands of atelectasis in the left lower lobe. Mild emphysema present. No pleural effusion or pneumothorax. Musculoskeletal: No chest wall mass or suspicious bone lesions identified. CT ABDOMEN PELVIS FINDINGS Hepatobiliary: Biliary stent is seen in the left hepatic lobe with distal portion ending at the level of the pancreatic head/duodenal similar to prior. Gallbladder and bile ducts are within normal limits. No focal liver lesion seen. Pancreas: Unremarkable. No pancreatic ductal dilatation or surrounding inflammatory changes. Spleen: Normal in size without focal abnormality. Adrenals/Urinary Tract: There is a 3 mm calculus in the left kidney. Otherwise, the kidneys, adrenal glands  and bladder are within normal limits. Stomach/Bowel: Stomach is within normal limits. Appendix appears normal. No evidence of bowel wall thickening, distention, or inflammatory changes. There is sigmoid colon diverticulosis. Vascular/Lymphatic: Aortic atherosclerosis. No enlarged abdominal or pelvic lymph nodes. Reproductive: Prostate gland is small or absent. Other: There is no ascites. There is a moderate-sized fat containing supraumbilical ventral hernia similar to prior. Musculoskeletal: No acute or significant osseous findings. IMPRESSION: 1. New 6 mm nodular density in the left lower lobe. Non-contrast chest CT at 6-12 months is recommended.  If the nodule is stable at time of repeat CT, then future CT at 18-24 months (from today's scan) is considered optional for low-risk patients, but is recommended for high-risk patients. This recommendation follows the consensus statement: Guidelines for Management of Incidental Pulmonary Nodules Detected on CT Images: From the Fleischner Society 2017; Radiology 2017; 284:228-243. 2. No evidence for metastatic disease in the abdomen or pelvis. 3. Stable biliary stent. 4. Nonobstructing left renal calculus. 5. Colonic diverticulosis. 6. Stable fat containing supraumbilical ventral hernia. 7. Aortic atherosclerosis. Aortic Atherosclerosis (ICD10-I70.0). Electronically Signed   By: Greig Pique M.D.   On: 12/09/2023 21:53      Subjective: Patient seen and examined at bedside today.  Hemodynamically stable.  Very comfortable.  No new complaints.  Shortness of breath, abdominal pain, nausea or vomiting.  Very eager to go home.  Platelets  level improved to 42,000 today  Discharge Exam: Vitals:   12/10/23 0625 12/10/23 1016  BP:  (!) 156/82  Pulse: 73 75  Resp:  20  Temp:  97.8 F (36.6 C)  SpO2:  98%   Vitals:   12/10/23 0115 12/10/23 0527 12/10/23 0625 12/10/23 1016  BP: (!) 165/90 135/88  (!) 156/82  Pulse: 92 (!) 140 73 75  Resp: 18 20  20   Temp: 98.1 F (36.7 C) 98.1 F (36.7 C)  97.8 F (36.6 C)  TempSrc: Oral Oral  Oral  SpO2: 93% 97%  98%  Weight:      Height:        General: Pt is alert, awake, not in acute distress,obese Cardiovascular: RRR, S1/S2 +, no rubs, no gallops Respiratory: CTA bilaterally, no wheezing, no rhonchi Abdominal: Soft, NT, ND, bowel sounds + Extremities: no edema, no cyanosis    The results of significant diagnostics from this hospitalization (including imaging, microbiology, ancillary and laboratory) are listed below for reference.     Microbiology: No results found for this or any previous visit (from the past 240 hours).   Labs: BNP (last 3  results) No results for input(s): BNP in the last 8760 hours. Basic Metabolic Panel: Recent Labs  Lab 12/09/23 1213 12/09/23 1418  NA 135 134*  K 4.1 4.0  CL 99 97*  CO2 28 26  GLUCOSE 126* 198*  BUN 15 16  CREATININE 0.66 0.75  CALCIUM  9.5 9.2   Liver Function Tests: Recent Labs  Lab 12/09/23 1213 12/09/23 1418  AST 17 18  ALT 21 19  ALKPHOS 111 112  BILITOT 0.3 0.3  PROT 6.9 6.6  ALBUMIN 4.2 4.0   No results for input(s): LIPASE, AMYLASE in the last 168 hours. No results for input(s): AMMONIA in the last 168 hours. CBC: Recent Labs  Lab 12/09/23 1213 12/09/23 1418 12/09/23 1800 12/10/23 0853  WBC 12.0* 11.8*  --  10.0  NEUTROABS 10.7* 10.7*  --   --   HGB 10.3* 10.0*  --  9.9*  HCT 29.7* 29.3*  --  29.6*  MCV 101.7* 105.0*  --  105.0*  PLT 9* 10* 18* 42*   Cardiac Enzymes: No results for input(s): CKTOTAL, CKMB, CKMBINDEX, TROPONINI in the last 168 hours. BNP: Invalid input(s): POCBNP CBG: Recent Labs  Lab 12/10/23 0730  GLUCAP 107*   D-Dimer No results for input(s): DDIMER in the last 72 hours. Hgb A1c No results for input(s): HGBA1C in the last 72 hours. Lipid Profile No results for input(s): CHOL, HDL, LDLCALC, TRIG, CHOLHDL, LDLDIRECT in the last 72 hours. Thyroid  function studies No results for input(s): TSH, T4TOTAL, T3FREE, THYROIDAB in the last 72 hours.  Invalid input(s): FREET3 Anemia work up No results for input(s): VITAMINB12, FOLATE, FERRITIN, TIBC, IRON, RETICCTPCT in the last 72 hours. Urinalysis    Component Value Date/Time   COLORURINE YELLOW 04/06/2022 1004   APPEARANCEUR CLEAR 04/06/2022 1004   LABSPEC 1.009 04/06/2022 1004   PHURINE 6.5 04/06/2022 1004   GLUCOSEU NEGATIVE 04/06/2022 1004   HGBUR NEGATIVE 04/06/2022 1004   BILIRUBINUR NEGATIVE 01/30/2018 0630   KETONESUR NEGATIVE 04/06/2022 1004   PROTEINUR TRACE (A) 04/06/2022 1004   NITRITE NEGATIVE 04/06/2022  1004   LEUKOCYTESUR NEGATIVE 04/06/2022 1004   Sepsis Labs Recent Labs  Lab 12/09/23 1213 12/09/23 1418 12/10/23 0853  WBC 12.0* 11.8* 10.0   Microbiology No results found for this or any previous visit (from the past 240 hours).  Please note: You were cared for by a hospitalist during your hospital stay. Once you are discharged, your primary care physician will handle any further medical issues. Please note that NO REFILLS for any discharge medications will be authorized once you are discharged, as it is imperative that you return to your primary care physician (or establish a relationship with a primary care physician if you do not have one) for your post hospital discharge needs so that they can reassess your need for medications and monitor your lab values.    Time coordinating discharge: 40 minutes  SIGNED:   Ivonne Mustache, MD  [facility] 12/10/2023, 10:54 AM Pager [phone]  If 7PM-7AM, please contact night-coverage [email or web address] Password [email or web address]

## 2023-12-11 LAB — HEMOGLOBIN A1C
Hgb A1c MFr Bld: 5.6 % (ref 4.8–5.6)
Mean Plasma Glucose: 114 mg/dL

## 2023-12-12 ENCOUNTER — Telehealth: Payer: Self-pay | Admitting: Oncology

## 2023-12-12 LAB — PREPARE PLATELET PHERESIS
Unit division: 0
Unit division: 0

## 2023-12-12 LAB — BPAM PLATELET PHERESIS
Blood Product Expiration Date: 202511302359
Blood Product Expiration Date: 202512012359
ISSUE DATE / TIME: 202511281553
ISSUE DATE / TIME: 202511282245
Unit Type and Rh: 6200
Unit Type and Rh: 5100

## 2023-12-12 NOTE — Telephone Encounter (Signed)
 Called PT to schedule lab appt, would like to wait until tomorrow. Time is confirmed.

## 2023-12-13 ENCOUNTER — Other Ambulatory Visit: Payer: Self-pay

## 2023-12-13 ENCOUNTER — Inpatient Hospital Stay
Admission: RE | Admit: 2023-12-13 | Discharge: 2023-12-13 | Disposition: A | Payer: Self-pay | Source: Ambulatory Visit | Attending: Nurse Practitioner | Admitting: Nurse Practitioner

## 2023-12-13 ENCOUNTER — Inpatient Hospital Stay: Attending: Oncology

## 2023-12-13 ENCOUNTER — Inpatient Hospital Stay: Admitting: Nurse Practitioner

## 2023-12-13 ENCOUNTER — Encounter: Payer: Self-pay | Admitting: Nurse Practitioner

## 2023-12-13 ENCOUNTER — Telehealth: Payer: Self-pay

## 2023-12-13 VITALS — BP 130/60 | HR 92 | Temp 97.8°F | Resp 18 | Ht 66.0 in | Wt 220.1 lb

## 2023-12-13 DIAGNOSIS — D696 Thrombocytopenia, unspecified: Secondary | ICD-10-CM

## 2023-12-13 DIAGNOSIS — I10 Essential (primary) hypertension: Secondary | ICD-10-CM | POA: Diagnosis not present

## 2023-12-13 DIAGNOSIS — Z7901 Long term (current) use of anticoagulants: Secondary | ICD-10-CM | POA: Insufficient documentation

## 2023-12-13 DIAGNOSIS — C61 Malignant neoplasm of prostate: Secondary | ICD-10-CM | POA: Insufficient documentation

## 2023-12-13 DIAGNOSIS — Z79899 Other long term (current) drug therapy: Secondary | ICD-10-CM | POA: Insufficient documentation

## 2023-12-13 DIAGNOSIS — C240 Malignant neoplasm of extrahepatic bile duct: Secondary | ICD-10-CM

## 2023-12-13 DIAGNOSIS — J449 Chronic obstructive pulmonary disease, unspecified: Secondary | ICD-10-CM | POA: Insufficient documentation

## 2023-12-13 DIAGNOSIS — D6959 Other secondary thrombocytopenia: Secondary | ICD-10-CM | POA: Insufficient documentation

## 2023-12-13 DIAGNOSIS — T451X5A Adverse effect of antineoplastic and immunosuppressive drugs, initial encounter: Secondary | ICD-10-CM | POA: Diagnosis not present

## 2023-12-13 DIAGNOSIS — C221 Intrahepatic bile duct carcinoma: Secondary | ICD-10-CM | POA: Diagnosis present

## 2023-12-13 DIAGNOSIS — I4892 Unspecified atrial flutter: Secondary | ICD-10-CM | POA: Diagnosis not present

## 2023-12-13 DIAGNOSIS — Z5111 Encounter for antineoplastic chemotherapy: Secondary | ICD-10-CM | POA: Insufficient documentation

## 2023-12-13 DIAGNOSIS — Z720 Tobacco use: Secondary | ICD-10-CM | POA: Insufficient documentation

## 2023-12-13 DIAGNOSIS — E785 Hyperlipidemia, unspecified: Secondary | ICD-10-CM | POA: Insufficient documentation

## 2023-12-13 LAB — CMP (CANCER CENTER ONLY)
ALT: 18 U/L (ref 0–44)
AST: 19 U/L (ref 15–41)
Albumin: 4 g/dL (ref 3.5–5.0)
Alkaline Phosphatase: 105 U/L (ref 38–126)
Anion gap: 11 (ref 5–15)
BUN: 20 mg/dL (ref 8–23)
CO2: 28 mmol/L (ref 22–32)
Calcium: 9.5 mg/dL (ref 8.9–10.3)
Chloride: 95 mmol/L — ABNORMAL LOW (ref 98–111)
Creatinine: 0.68 mg/dL (ref 0.61–1.24)
GFR, Estimated: >60 mL/min (ref >60)
Glucose, Bld: 211 mg/dL — ABNORMAL HIGH (ref 70–99)
Potassium: 3.8 mmol/L (ref 3.5–5.1)
Sodium: 134 mmol/L — ABNORMAL LOW (ref 135–145)
Total Bilirubin: 0.2 mg/dL (ref 0.0–1.2)
Total Protein: 6.5 g/dL (ref 6.5–8.1)

## 2023-12-13 LAB — CBC WITH DIFFERENTIAL (CANCER CENTER ONLY)
Abs Immature Granulocytes: 0.12 K/uL — ABNORMAL HIGH (ref 0.00–0.07)
Basophils Absolute: 0 K/uL (ref 0.0–0.1)
Basophils Relative: 0 %
Eosinophils Absolute: 0 K/uL (ref 0.0–0.5)
Eosinophils Relative: 0 %
HCT: 32.2 % — ABNORMAL LOW (ref 39.0–52.0)
Hemoglobin: 11 g/dL — ABNORMAL LOW (ref 13.0–17.0)
Immature Granulocytes: 1 %
Lymphocytes Relative: 6 %
Lymphs Abs: 0.6 K/uL — ABNORMAL LOW (ref 0.7–4.0)
MCH: 36.1 pg — ABNORMAL HIGH (ref 26.0–34.0)
MCHC: 34.2 g/dL (ref 30.0–36.0)
MCV: 105.6 fL — ABNORMAL HIGH (ref 80.0–100.0)
Monocytes Absolute: 0.6 K/uL (ref 0.1–1.0)
Monocytes Relative: 6 %
Neutro Abs: 8.5 K/uL — ABNORMAL HIGH (ref 1.7–7.7)
Neutrophils Relative %: 87 %
Platelet Count: 67 K/uL — ABNORMAL LOW (ref 150–400)
RBC: 3.05 MIL/uL — ABNORMAL LOW (ref 4.22–5.81)
RDW: 16.2 % — ABNORMAL HIGH (ref 11.5–15.5)
WBC Count: 9.9 K/uL (ref 4.0–10.5)
nRBC: 0 % (ref 0.0–0.2)

## 2023-12-13 LAB — MAGNESIUM: Magnesium: 1.8 mg/dL (ref 1.7–2.4)

## 2023-12-13 NOTE — Telephone Encounter (Signed)
 I faxed a request to have the patient CT scan done at [facility] on 9/12 loaded in to our system and push our CT from 11/28 to [facility]

## 2023-12-13 NOTE — Progress Notes (Signed)
 Robert [name] OFFICE PROGRESS NOTE   Diagnosis: Cholangiocarcinoma  INTERVAL HISTORY:   Robert [name] returns as scheduled.  He completed cycle 7-day 8 gemcitabine /cisplatin  11/30/2023.  Gemcitabine  was dose reduced due to thrombocytopenia.  He had a CBC on 12/09/2023.  The platelet count returned at 9000.  He was hospitalized for a platelet transfusion.  He was transfused 2 units of platelets on 12/09/2023.  The platelet count returned at 42,000 on 12/10/2023.  He was discharged home.  He noted easy bruising over the forearms prior to admission.  No other bleeding.  He denies nausea/vomiting.  No mouth sores.  No diarrhea.  No fever or rash after treatment.  No arthralgias.  Objective:  Vital signs in last 24 hours:  Blood pressure 130/60, pulse 92, temperature 97.8 F (36.6 C), temperature source Temporal, resp. rate 18, height 5' 6 (1.676 m), weight 220 lb 1.6 oz (99.8 kg), SpO2 98%.    HEENT: No thrush or ulcers. Resp: Distant breath sounds, scattered wheezes.  No respiratory distress. Cardio: Regular rate and rhythm. GI: Nontender.  No hepatosplenomegaly. Vascular: No leg edema. Skin: Ecchymoses scattered over the forearms. Port-A-Cath without erythema.  Lab Results:  Lab Results  Component Value Date   WBC 9.9 12/13/2023   HGB 11.0 (L) 12/13/2023   HCT 32.2 (L) 12/13/2023   MCV 105.6 (H) 12/13/2023   PLT 67 (L) 12/13/2023   NEUTROABS 8.5 (H) 12/13/2023    Imaging:  No results found.  Medications: I have reviewed the patient's current medications.  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 with dilation of the left and right biliary systems, left and right hepatic stents and temporary pancreatic stent placed, stricture brushing-adenocarcinoma; HER2 IHC result equivocal (2+), FISH results indeterminant; Tempus-microsatellite stable, tumor mutation burden less than 10, ERBB2 expression, BRCA2 mutated CT chest 05/06/2023: No evidence of metastatic disease Elevated CA 19-9 on 05/03/2023 CT abdomen 05/27/2023 (due), decreased Intermatic biliary duct dilation, status post stenting of common bile duct, persistent soft tissue at the hepatic hilum, pancreatic duct stent in place Upper endoscopy 06/16/2023-gastritis.  Duodenitis.  Plastic biliary and pancreatic stents noted at the major papilla.  Pancreatic stent removed. Cycle 1 gemcitabine /cisplatin /Durvalumab  06/20/2023, day 8 06/28/2023 (Gemcitabine  dose reduced due to thrombocytopenia), Fulphila  Cycle 2 gemcitabine /cisplatin /Durvalumab  07/14/2023, day 8 07/21/2023, Fulphila  Cycle 3 gemcitabine /cisplatin /durvalumab  08/09/2023 (Gem dose reduced C3D8 for thrombocytopenia) Cycle 4 Day 1 gem/cisplatin /durvalumab  08/31/23, gem dose reduced; gemcitabine /cisplatin  09/08/2023 09/23/2023 CT abdomen: No change in the ill-defined soft tissue surrounding the common bile duct, stable periportal and retroperitoneal lymph nodes, biliary stents in place with decreased intrahepatic duct dilation Cycle 5-day 1 gemcitabine /cisplatin /durvalumab  10/05/2023, day 8 10/12/2023 Cycle 6-day 1 gemcitabine /cisplatin /durvalumab  11/02/2023 (gemcitabine  further dose reduced); day 8 11/09/2023 Gemcitabine  held due to thrombocytopenia Cycle 7-day 1 gemcitabine /cisplatin /durvalumab  11/22/2023, day 8 11/30/2023 (Gemcitabine  dose reduced due to thrombocytopenia) CTs 12/09/2023-new 6 mm nodular density left lower lobe.  No evidence for metastatic disease in the abdomen or pelvis.  Stable biliary stent. Biliary obstruction secondary #1, status post placement  of right and left hepatic duct stents 05/06/2023 ERCP 11/07/2023-prior biliary sphincterotomy appeared open.  2 partially occluded stents from the biliary tree were seen in the major papilla, removed.  Middle third  of the main bile duct and lower third of the main bile duct were mildly dilated.  Irregularity/narrowing found in the common hepatic duct (improved from prior).  1 plastic biliary stent was placed into the left hepatic duct. 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  He has completed 7 cycles of gemcitabine /cisplatin /Durvalumab .  Restaging CT completed 12/09/2023 at which time he was hospitalized for thrombocytopenia.  We reviewed the results and images with Robert [name] and his daughter-in-law at today's visit.  The scan was compared to the last CT in our system 07/20/2023.  Per our review the mass at the porta hepatis is smaller.  We are  requesting CT images from [facility] completed 09/23/2023 be pushed through to our system for direct comparison.  We will forward the CT images from 12/09/2023 to Dr. Romero.  Treatment is currently on hold pending Dr. Vedia review.  CBC from today reviewed.  The platelet count is recovering.  He will return for follow-up as scheduled in 1 week.  We are available to see him sooner if needed.  Patient seen with Dr. Cloretta.    Robert [name] ANP/GNP-BC   12/13/2023  1:50 PM  This was a shared visit with Robert [name].  Robert [name] developed severe thrombocytopenia following the last cycle of chemotherapy.  He was seen in the emergency room on 12/09/2023 and received platelet transfusions.  The platelet count is higher today.  He will resume  apixaban .  We reviewed the restaging CT findings and images with him.  The tiny left lung nodule is likely a benign finding.  The hilar mass appears smaller compared to April of this year by my review.  We will request the recent [facility] images for a direct comparison.  I will contact Dr. Romero to schedule a telehealth visit to consider the indication for surgery.  Robert Cloretta, MD

## 2023-12-13 NOTE — Patient Instructions (Signed)

## 2023-12-14 ENCOUNTER — Inpatient Hospital Stay

## 2023-12-14 LAB — CANCER ANTIGEN 19-9: CA 19-9: 48 U/mL — ABNORMAL HIGH (ref 0–35)

## 2023-12-16 ENCOUNTER — Inpatient Hospital Stay

## 2023-12-16 DIAGNOSIS — C221 Intrahepatic bile duct carcinoma: Secondary | ICD-10-CM | POA: Diagnosis not present

## 2023-12-18 DIAGNOSIS — J441 Chronic obstructive pulmonary disease with (acute) exacerbation: Secondary | ICD-10-CM | POA: Diagnosis not present

## 2023-12-18 DIAGNOSIS — J189 Pneumonia, unspecified organism: Secondary | ICD-10-CM | POA: Diagnosis not present

## 2023-12-18 DIAGNOSIS — A419 Sepsis, unspecified organism: Secondary | ICD-10-CM | POA: Diagnosis not present

## 2023-12-20 ENCOUNTER — Inpatient Hospital Stay: Admitting: Nurse Practitioner

## 2023-12-20 ENCOUNTER — Encounter: Payer: Self-pay | Admitting: Nurse Practitioner

## 2023-12-20 ENCOUNTER — Inpatient Hospital Stay

## 2023-12-20 ENCOUNTER — Telehealth: Payer: Self-pay

## 2023-12-20 ENCOUNTER — Encounter: Payer: Self-pay | Admitting: Oncology

## 2023-12-20 VITALS — BP 131/69 | HR 79 | Temp 97.8°F | Resp 18 | Ht 66.0 in | Wt 220.1 lb

## 2023-12-20 DIAGNOSIS — C240 Malignant neoplasm of extrahepatic bile duct: Secondary | ICD-10-CM | POA: Diagnosis not present

## 2023-12-20 DIAGNOSIS — C221 Intrahepatic bile duct carcinoma: Secondary | ICD-10-CM | POA: Diagnosis not present

## 2023-12-20 LAB — CBC WITH DIFFERENTIAL (CANCER CENTER ONLY)
Abs Immature Granulocytes: 0.13 K/uL — ABNORMAL HIGH (ref 0.00–0.07)
Basophils Absolute: 0 K/uL (ref 0.0–0.1)
Basophils Relative: 0 %
Eosinophils Absolute: 0.1 K/uL (ref 0.0–0.5)
Eosinophils Relative: 1 %
HCT: 33.5 % — ABNORMAL LOW (ref 39.0–52.0)
Hemoglobin: 11.3 g/dL — ABNORMAL LOW (ref 13.0–17.0)
Immature Granulocytes: 2 %
Lymphocytes Relative: 20 %
Lymphs Abs: 1.8 K/uL (ref 0.7–4.0)
MCH: 35.9 pg — ABNORMAL HIGH (ref 26.0–34.0)
MCHC: 33.7 g/dL (ref 30.0–36.0)
MCV: 106.3 fL — ABNORMAL HIGH (ref 80.0–100.0)
Monocytes Absolute: 1.9 K/uL — ABNORMAL HIGH (ref 0.1–1.0)
Monocytes Relative: 21 %
Neutro Abs: 4.9 K/uL (ref 1.7–7.7)
Neutrophils Relative %: 56 %
Platelet Count: 128 K/uL — ABNORMAL LOW (ref 150–400)
RBC: 3.15 MIL/uL — ABNORMAL LOW (ref 4.22–5.81)
RDW: 17.2 % — ABNORMAL HIGH (ref 11.5–15.5)
WBC Count: 8.8 K/uL (ref 4.0–10.5)
nRBC: 0 % (ref 0.0–0.2)

## 2023-12-20 LAB — CMP (CANCER CENTER ONLY)
ALT: 17 U/L (ref 0–44)
AST: 18 U/L (ref 15–41)
Albumin: 3.9 g/dL (ref 3.5–5.0)
Alkaline Phosphatase: 86 U/L (ref 38–126)
Anion gap: 8 (ref 5–15)
BUN: 19 mg/dL (ref 8–23)
CO2: 30 mmol/L (ref 22–32)
Calcium: 9.9 mg/dL (ref 8.9–10.3)
Chloride: 99 mmol/L (ref 98–111)
Creatinine: 0.83 mg/dL (ref 0.61–1.24)
GFR, Estimated: >60 mL/min (ref >60)
Glucose, Bld: 102 mg/dL — ABNORMAL HIGH (ref 70–99)
Potassium: 4.4 mmol/L (ref 3.5–5.1)
Sodium: 137 mmol/L (ref 135–145)
Total Bilirubin: 0.3 mg/dL (ref 0.0–1.2)
Total Protein: 6.6 g/dL (ref 6.5–8.1)

## 2023-12-20 LAB — MAGNESIUM: Magnesium: 1.8 mg/dL (ref 1.7–2.4)

## 2023-12-20 NOTE — Telephone Encounter (Signed)
 The writer called and ask radiology for direct comparison 12/09/2023 to [facility] CT 09/23/2023.

## 2023-12-20 NOTE — Progress Notes (Signed)
 [facility] OFFICE PROGRESS NOTE   Diagnosis: Cholangiocarcinoma  INTERVAL HISTORY:   Robert [name] returns as scheduled.  No complaints.  He denies nausea/vomiting.  No mouth sores.  No diarrhea.  No rash.  He denies abdominal pain.  He has a good appetite.  Dyspnea on exertion and cough at baseline.  He denies tinnitus, hearing loss, numbness/tingling in the hands or feet.  No bleeding.   Objective:  Vital signs in last 24 hours:  Blood pressure 131/69, pulse 79, temperature 97.8 F (36.6 C), temperature source Temporal, resp. rate 18, height 5' 6 (1.676 m), weight 220 lb 1.6 oz (99.8 kg), SpO2 98%.    HEENT: No thrush or ulcers. Resp: Distant breath sounds.  No respiratory distress. Cardio: Regular rate and rhythm. GI: No hepatosplenomegaly.  Nontender. Vascular: No leg edema. Skin: No rash. Port-A-Cath without erythema.  Lab Results:  Lab Results  Component Value Date   WBC 8.8 12/20/2023   HGB 11.3 (L) 12/20/2023   HCT 33.5 (L) 12/20/2023   MCV 106.3 (H) 12/20/2023   PLT 128 (L) 12/20/2023   NEUTROABS 4.9 12/20/2023    Imaging:  No results found.  Medications: I have reviewed the patient's current medications.  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 with dilation of the left and right biliary systems, left and right hepatic stents and temporary pancreatic stent placed, stricture brushing-adenocarcinoma; HER2 IHC result equivocal (2+), FISH results indeterminant; Tempus-microsatellite stable, tumor mutation burden less than 10, ERBB2 expression, BRCA2  mutated CT chest 05/06/2023: No evidence of metastatic disease Elevated CA 19-9 on 05/03/2023 CT abdomen 05/27/2023 (due), decreased Intermatic biliary duct dilation, status post stenting of common bile duct, persistent soft tissue at the hepatic hilum, pancreatic duct stent in place Upper endoscopy 06/16/2023-gastritis.  Duodenitis.  Plastic biliary and pancreatic stents noted at the major papilla.  Pancreatic stent removed. Cycle 1 gemcitabine /cisplatin /Durvalumab  06/20/2023, day 8 06/28/2023 (Gemcitabine  dose reduced due to thrombocytopenia), Fulphila  Cycle 2 gemcitabine /cisplatin /Durvalumab  07/14/2023, day 8 07/21/2023, Fulphila  Cycle 3 gemcitabine /cisplatin /durvalumab  08/09/2023 (Gem dose reduced C3D8 for thrombocytopenia) Cycle 4 Day 1 gem/cisplatin /durvalumab  08/31/23, gem dose reduced; gemcitabine /cisplatin  09/08/2023 09/23/2023 CT abdomen: No change in the ill-defined soft tissue surrounding the common bile duct, stable periportal and retroperitoneal lymph nodes, biliary stents in place with decreased intrahepatic duct dilation Cycle 5-day 1 gemcitabine /cisplatin /durvalumab  10/05/2023, day 8 10/12/2023 Cycle 6-day 1 gemcitabine /cisplatin /durvalumab  11/02/2023 (gemcitabine  further dose reduced); day 8 11/09/2023 Gemcitabine  held due to thrombocytopenia Cycle 7-day 1 gemcitabine /cisplatin /durvalumab  11/22/2023, day 8 11/30/2023 (Gemcitabine  dose reduced due to thrombocytopenia) CTs 12/09/2023-new 6 mm nodular density left lower lobe.  No evidence for metastatic disease in the abdomen or pelvis.  Stable biliary stent. 12/16/2023 Robert [name] -unresectable.  Recommends maintenance Durvalumab . Cycle 8-day 1 cisplatin /Durvalumab  12/20/2023, Gemcitabine  held due to thrombocytopenia Biliary obstruction secondary #1, status post placement of right and left hepatic duct stents 05/06/2023 ERCP 11/07/2023-prior biliary sphincterotomy appeared open.  2 partially occluded stents from the biliary tree were seen in the major  papilla, removed.  Middle third of the main bile duct and lower third of the main bile duct were mildly dilated.  Irregularity/narrowing found in the common hepatic duct (improved from prior).  1 plastic biliary stent was placed  into the left hepatic duct. 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  He has completed 7 cycles of cisplatin /gemcitabine /Durvalumab .  Overall tolerating treatment well.  Per Robert [name] not felt to be resectable.  Plan to continue systemic therapy.  He will complete cycle 8-day 1  12/21/2023.  Hold Gemcitabine  due to severe thrombocytopenia with cycle 7.  He will return for follow-up and cycle 8-day 8 cisplatin  in 1 week.  We are available to see him sooner if needed.  Patient seen with Robert [name].    Robert [name]   12/20/2023  1:58 PM  This was a shared visit with Robert [name].  We discussed treatment options with Robert [name].  I have communicated with Robert [name].  Robert [name] does not recommend resection of the hilar mass.  He has communicated with Robert [name].  I recommend completing cycle 8 chemotherapy with cisplatin  only.  Gemcitabine  will be placed on hold due to the recent severe thrombocytopenia.  The plan is to continue durvalumab  maintenance after the completion of cycle 8 chemotherapy.  He will be referred to Robert [name] for placement of a permanent biliary stent.  Robert Cloretta, MD

## 2023-12-20 NOTE — Patient Instructions (Signed)

## 2023-12-21 ENCOUNTER — Inpatient Hospital Stay

## 2023-12-21 ENCOUNTER — Other Ambulatory Visit: Payer: Self-pay

## 2023-12-21 ENCOUNTER — Other Ambulatory Visit: Payer: Self-pay | Admitting: Nurse Practitioner

## 2023-12-21 VITALS — BP 138/66 | HR 62 | Temp 98.0°F | Resp 18

## 2023-12-21 DIAGNOSIS — C240 Malignant neoplasm of extrahepatic bile duct: Secondary | ICD-10-CM

## 2023-12-21 DIAGNOSIS — C221 Intrahepatic bile duct carcinoma: Secondary | ICD-10-CM | POA: Diagnosis not present

## 2023-12-21 MED ORDER — SODIUM CHLORIDE 0.9 % IV SOLN
25.0000 mg/m2 | Freq: Once | INTRAVENOUS | Status: AC
  Administered 2023-12-21: 50 mg via INTRAVENOUS
  Filled 2023-12-21: qty 50

## 2023-12-21 MED ORDER — SODIUM CHLORIDE 0.9 % IV SOLN
150.0000 mg | Freq: Once | INTRAVENOUS | Status: AC
  Administered 2023-12-21: 150 mg via INTRAVENOUS
  Filled 2023-12-21: qty 150

## 2023-12-21 MED ORDER — SODIUM CHLORIDE 0.9 % IV SOLN: INTRAVENOUS | Status: DC

## 2023-12-21 MED ORDER — SODIUM CHLORIDE 0.9 % IV SOLN
1500.0000 mg | Freq: Once | INTRAVENOUS | Status: AC
  Administered 2023-12-21: 1500 mg via INTRAVENOUS
  Filled 2023-12-21: qty 30

## 2023-12-21 MED ORDER — POTASSIUM CHLORIDE IN NACL 20-0.9 MEQ/L-% IV SOLN
Freq: Once | INTRAVENOUS | Status: AC
  Filled 2023-12-21: qty 1000

## 2023-12-21 MED ORDER — MAGNESIUM SULFATE 2 GM/50ML IV SOLN
2.0000 g | Freq: Once | INTRAVENOUS | Status: AC
  Administered 2023-12-21: 2 g via INTRAVENOUS
  Filled 2023-12-21: qty 50

## 2023-12-21 MED ORDER — DEXAMETHASONE SOD PHOSPHATE PF 10 MG/ML IJ SOLN
10.0000 mg | Freq: Once | INTRAMUSCULAR | Status: AC
  Administered 2023-12-21: 10 mg via INTRAVENOUS

## 2023-12-21 MED ORDER — PALONOSETRON HCL INJECTION 0.25 MG/5ML
0.2500 mg | Freq: Once | INTRAVENOUS | Status: AC
  Administered 2023-12-21: 0.25 mg via INTRAVENOUS
  Filled 2023-12-21: qty 5

## 2023-12-21 NOTE — Patient Instructions (Signed)
 [facility] - A DEPT OF [facility]  Discharge Instructions: Thank you for choosing [facility] to provide your oncology and hematology care.   If you have a lab appointment with the Cancer Center, please go directly to the Cancer Center and check in at the registration area.   Wear comfortable clothing and clothing appropriate for easy access to any Portacath or PICC line.   We strive to give you quality time with your provider. You may need to reschedule your appointment if you arrive late (15 or more minutes).  Arriving late affects you and other patients whose appointments are after yours.  Also, if you miss three or more appointments without notifying the office, you may be dismissed from the clinic at the providers discretion.      For prescription refill requests, have your pharmacy contact our office and allow 72 hours for refills to be completed.    Today you received the following chemotherapy and/or immunotherapy agents: imfinzi , cisplatin       To help prevent nausea and vomiting after your treatment, we encourage you to take your nausea medication as directed.  BELOW ARE SYMPTOMS THAT SHOULD BE REPORTED IMMEDIATELY: *FEVER GREATER THAN 100.4 F (38 C) OR HIGHER *CHILLS OR SWEATING *NAUSEA AND VOMITING THAT IS NOT CONTROLLED WITH YOUR NAUSEA MEDICATION *UNUSUAL SHORTNESS OF BREATH *UNUSUAL BRUISING OR BLEEDING *URINARY PROBLEMS (pain or burning when urinating, or frequent urination) *BOWEL PROBLEMS (unusual diarrhea, constipation, pain near the anus) TENDERNESS IN MOUTH AND THROAT WITH OR WITHOUT PRESENCE OF ULCERS (sore throat, sores in mouth, or a toothache) UNUSUAL RASH, SWELLING OR PAIN  UNUSUAL VAGINAL DISCHARGE OR ITCHING   Items with * indicate a potential emergency and should be followed up as soon as possible or go to the Emergency Department if any problems should occur.  Please show the CHEMOTHERAPY ALERT CARD or  IMMUNOTHERAPY ALERT CARD at check-in to the Emergency Department and triage nurse.  Should you have questions after your visit or need to cancel or reschedule your appointment, please contact [facility] - A DEPT OF [facility]  Dept: [phone]  and follow the prompts.  Office hours are 8:00 a.m. to 4:30 p.m. Monday - Friday. Please note that voicemails left after 4:00 p.m. may not be returned until the following business day.  We are closed weekends and major holidays. You have access to a nurse at all times for urgent questions. Please call the main number to the clinic Dept: [phone] and follow the prompts.   For any non-urgent questions, you may also contact your provider using MyChart. We now offer e-Visits for anyone 15 and older to request care online for non-urgent symptoms. For details visit [email or web address].   Also download the MyChart app! Go to the app store, search MyChart, open the app, select [place], and log in with your MyChart username and password.

## 2023-12-23 ENCOUNTER — Inpatient Hospital Stay

## 2023-12-24 ENCOUNTER — Other Ambulatory Visit: Payer: Self-pay | Admitting: Oncology

## 2023-12-26 DIAGNOSIS — D6959 Other secondary thrombocytopenia: Secondary | ICD-10-CM | POA: Diagnosis not present

## 2023-12-26 DIAGNOSIS — I10 Essential (primary) hypertension: Secondary | ICD-10-CM | POA: Diagnosis not present

## 2023-12-26 DIAGNOSIS — I4891 Unspecified atrial fibrillation: Secondary | ICD-10-CM | POA: Diagnosis not present

## 2023-12-28 ENCOUNTER — Inpatient Hospital Stay: Admitting: Nurse Practitioner

## 2023-12-28 ENCOUNTER — Inpatient Hospital Stay

## 2023-12-28 ENCOUNTER — Encounter: Payer: Self-pay | Admitting: Nurse Practitioner

## 2023-12-28 VITALS — BP 141/64 | HR 68 | Temp 97.8°F | Resp 18 | Ht 66.0 in | Wt 224.5 lb

## 2023-12-28 DIAGNOSIS — C240 Malignant neoplasm of extrahepatic bile duct: Secondary | ICD-10-CM | POA: Diagnosis not present

## 2023-12-28 DIAGNOSIS — C221 Intrahepatic bile duct carcinoma: Secondary | ICD-10-CM | POA: Diagnosis not present

## 2023-12-28 LAB — CBC WITH DIFFERENTIAL (CANCER CENTER ONLY)
Abs Immature Granulocytes: 0.05 K/uL (ref 0.00–0.07)
Basophils Absolute: 0 K/uL (ref 0.0–0.1)
Basophils Relative: 1 %
Eosinophils Absolute: 0.1 K/uL (ref 0.0–0.5)
Eosinophils Relative: 1 %
HCT: 33 % — ABNORMAL LOW (ref 39.0–52.0)
Hemoglobin: 11 g/dL — ABNORMAL LOW (ref 13.0–17.0)
Immature Granulocytes: 1 %
Lymphocytes Relative: 23 %
Lymphs Abs: 1.9 K/uL (ref 0.7–4.0)
MCH: 35.6 pg — ABNORMAL HIGH (ref 26.0–34.0)
MCHC: 33.3 g/dL (ref 30.0–36.0)
MCV: 106.8 fL — ABNORMAL HIGH (ref 80.0–100.0)
Monocytes Absolute: 1.1 K/uL — ABNORMAL HIGH (ref 0.1–1.0)
Monocytes Relative: 13 %
Neutro Abs: 5.2 K/uL (ref 1.7–7.7)
Neutrophils Relative %: 61 %
Platelet Count: 165 K/uL (ref 150–400)
RBC: 3.09 MIL/uL — ABNORMAL LOW (ref 4.22–5.81)
RDW: 16.1 % — ABNORMAL HIGH (ref 11.5–15.5)
WBC Count: 8.3 K/uL (ref 4.0–10.5)
nRBC: 0 % (ref 0.0–0.2)

## 2023-12-28 LAB — CMP (CANCER CENTER ONLY)
ALT: 17 U/L (ref 0–44)
AST: 18 U/L (ref 15–41)
Albumin: 4 g/dL (ref 3.5–5.0)
Alkaline Phosphatase: 76 U/L (ref 38–126)
Anion gap: 7 (ref 5–15)
BUN: 18 mg/dL (ref 8–23)
CO2: 31 mmol/L (ref 22–32)
Calcium: 9.6 mg/dL (ref 8.9–10.3)
Chloride: 101 mmol/L (ref 98–111)
Creatinine: 0.76 mg/dL (ref 0.61–1.24)
GFR, Estimated: >60 mL/min (ref >60)
Glucose, Bld: 103 mg/dL — ABNORMAL HIGH (ref 70–99)
Potassium: 4.6 mmol/L (ref 3.5–5.1)
Sodium: 139 mmol/L (ref 135–145)
Total Bilirubin: 0.3 mg/dL (ref 0.0–1.2)
Total Protein: 6.7 g/dL (ref 6.5–8.1)

## 2023-12-28 LAB — MAGNESIUM: Magnesium: 1.9 mg/dL (ref 1.7–2.4)

## 2023-12-28 NOTE — Progress Notes (Signed)
 [facility] OFFICE PROGRESS NOTE   Diagnosis: Cholangiocarcinoma  INTERVAL HISTORY:   Robert [name] returns as scheduled.  He completed cycle 8-day 1 cisplatin /Durvalumab  12/20/2023.  Gemcitabine  was held due to thrombocytopenia.  He denies nausea/vomiting.  No mouth sores.  No diarrhea.  No numbness or tingling in the hands or feet.  No tinnitus.  No hearing loss.  No rash.  Objective:  Vital signs in last 24 hours:  Blood pressure (!) 141/64, pulse 68, temperature 97.8 F (36.6 C), temperature source Temporal, resp. rate 18, height 5' 6 (1.676 m), weight 224 lb 8 oz (101.8 kg), SpO2 98%.    HEENT: No thrush or ulcers. Resp: Scattered expiratory wheezes.  No respiratory distress. Cardio: Regular rate and rhythm. GI: No hepatosplenomegaly.  Nontender. Vascular: No leg edema. Skin: No rash. Port-A-Cath without erythema.  Lab Results:  Lab Results  Component Value Date   WBC 8.3 12/28/2023   HGB 11.0 (L) 12/28/2023   HCT 33.0 (L) 12/28/2023   MCV 106.8 (H) 12/28/2023   PLT 165 12/28/2023   NEUTROABS 5.2 12/28/2023    Imaging:  No results found.  Medications: I have reviewed the patient's current medications.  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 with dilation of the left and right biliary systems, left and right hepatic stents and temporary pancreatic stent placed, stricture brushing-adenocarcinoma; HER2 IHC result equivocal (2+), FISH results indeterminant; Tempus-microsatellite stable, tumor mutation burden less than 10, ERBB2 expression, BRCA2 mutated CT  chest 05/06/2023: No evidence of metastatic disease Elevated CA 19-9 on 05/03/2023 CT abdomen 05/27/2023 (due), decreased Intermatic biliary duct dilation, status post stenting of common bile duct, persistent soft tissue at the hepatic hilum, pancreatic duct stent in place Upper endoscopy 06/16/2023-gastritis.  Duodenitis.  Plastic biliary and pancreatic stents noted at the major papilla.  Pancreatic stent removed. Cycle 1 gemcitabine /cisplatin /Durvalumab  06/20/2023, day 8 06/28/2023 (Gemcitabine  dose reduced due to thrombocytopenia), Fulphila  Cycle 2 gemcitabine /cisplatin /Durvalumab  07/14/2023, day 8 07/21/2023, Fulphila  Cycle 3 gemcitabine /cisplatin /durvalumab  08/09/2023 (Gem dose reduced C3D8 for thrombocytopenia) Cycle 4 Day 1 gem/cisplatin /durvalumab  08/31/23, gem dose reduced; gemcitabine /cisplatin  09/08/2023 09/23/2023 CT abdomen: No change in the ill-defined soft tissue surrounding the common bile duct, stable periportal and retroperitoneal lymph nodes, biliary stents in place with decreased intrahepatic duct dilation Cycle 5-day 1 gemcitabine /cisplatin /durvalumab  10/05/2023, day 8 10/12/2023 Cycle 6-day 1 gemcitabine /cisplatin /durvalumab  11/02/2023 (gemcitabine  further dose reduced); day 8 11/09/2023 Gemcitabine  held due to thrombocytopenia Cycle 7-day 1 gemcitabine /cisplatin /durvalumab  11/22/2023, day 8 11/30/2023 (Gemcitabine  dose reduced due to thrombocytopenia) CTs 12/09/2023-new 6 mm nodular density left lower lobe.  No evidence for metastatic disease in the abdomen or pelvis.  Stable biliary stent. 12/16/2023 Dr. Romero -unresectable.  Recommends maintenance Durvalumab . Cycle 8-day 1 cisplatin /Durvalumab  12/20/2023, Gemcitabine  held due to thrombocytopenia; day 8 cisplatin  12/29/2023 Biliary obstruction secondary #1, status post placement of right and left hepatic duct stents 05/06/2023 ERCP 11/07/2023-prior biliary sphincterotomy appeared open.  2 partially occluded stents from the biliary tree were  seen in the major papilla, removed.  Middle third of the main bile duct and lower third of the main bile duct were mildly dilated.  Irregularity/narrowing found in the common hepatic duct (improved from prior).  1 plastic biliary stent was placed into the  left hepatic duct. 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  He is completing cycle 8 cisplatin /Durvalumab .  Gemcitabine  was held day 1 due to thrombocytopenia.  He continues to tolerate treatment well.  There is no clinical evidence of disease progression.  Plan to proceed with day 8 cisplatin  as scheduled 12/28/2023, continue to hold Gemcitabine .  No G-CSF with this cycle.  He will begin maintenance Durvalumab  every 4 weeks 01/20/2024.  CBC and chemistry panel reviewed.  Labs are adequate for treatment.  He will return for follow-up and maintenance Durvalumab  01/20/2024.  We are available to see him sooner if needed.    [name] ANP/GNP-BC   12/28/2023  2:52 PM

## 2023-12-28 NOTE — Patient Instructions (Signed)

## 2023-12-29 ENCOUNTER — Inpatient Hospital Stay

## 2023-12-29 VITALS — BP 159/71 | HR 61 | Temp 98.0°F | Resp 17

## 2023-12-29 DIAGNOSIS — C240 Malignant neoplasm of extrahepatic bile duct: Secondary | ICD-10-CM

## 2023-12-29 DIAGNOSIS — C221 Intrahepatic bile duct carcinoma: Secondary | ICD-10-CM | POA: Diagnosis not present

## 2023-12-29 MED ORDER — SODIUM CHLORIDE 0.9 % IV SOLN: INTRAVENOUS | Status: DC

## 2023-12-29 MED ORDER — MAGNESIUM SULFATE 2 GM/50ML IV SOLN
2.0000 g | Freq: Once | INTRAVENOUS | Status: AC
  Administered 2023-12-29: 09:00:00 2 g via INTRAVENOUS
  Filled 2023-12-29: qty 50

## 2023-12-29 MED ORDER — POTASSIUM CHLORIDE IN NACL 20-0.9 MEQ/L-% IV SOLN
Freq: Once | INTRAVENOUS | Status: AC
  Filled 2023-12-29: qty 1000

## 2023-12-29 MED ORDER — DEXAMETHASONE SOD PHOSPHATE PF 10 MG/ML IJ SOLN
10.0000 mg | Freq: Once | INTRAMUSCULAR | Status: AC
  Administered 2023-12-29: 10:00:00 10 mg via INTRAVENOUS

## 2023-12-29 MED ORDER — SODIUM CHLORIDE 0.9 % IV SOLN
150.0000 mg | Freq: Once | INTRAVENOUS | Status: AC
  Administered 2023-12-29: 11:00:00 150 mg via INTRAVENOUS
  Filled 2023-12-29: qty 150

## 2023-12-29 MED ORDER — SODIUM CHLORIDE 0.9 % IV SOLN
25.0000 mg/m2 | Freq: Once | INTRAVENOUS | Status: AC
  Administered 2023-12-29: 12:00:00 50 mg via INTRAVENOUS
  Filled 2023-12-29: qty 50

## 2023-12-29 MED ORDER — PALONOSETRON HCL INJECTION 0.25 MG/5ML
0.2500 mg | Freq: Once | INTRAVENOUS | Status: AC
  Administered 2023-12-29: 10:00:00 0.25 mg via INTRAVENOUS
  Filled 2023-12-29: qty 5

## 2023-12-29 NOTE — Progress Notes (Signed)
 Pt urinated before the hanging of Cisplatin  infusion.

## 2023-12-29 NOTE — Patient Instructions (Signed)
 [facility] - A DEPT OF [facility] [place]  Discharge Instructions: Thank you for choosing [facility] to provide your oncology and hematology care.   If you have a lab appointment with the Cancer Center, please go directly to the Cancer Center and check in at the registration area.   Wear comfortable clothing and clothing appropriate for easy access to any Portacath or PICC line.   We strive to give you quality time with your provider. You may need to reschedule your appointment if you arrive late (15 or more minutes).  Arriving late affects you and other patients whose appointments are after yours.  Also, if you miss three or more appointments without notifying the office, you may be dismissed from the clinic at the provider's discretion.      For prescription refill requests, have your pharmacy contact our office and allow 72 hours for refills to be completed.    Today you received the following chemotherapy and/or immunotherapy agents: imfinzi       To help prevent nausea and vomiting after your treatment, we encourage you to take your nausea medication as directed.  BELOW ARE SYMPTOMS THAT SHOULD BE REPORTED IMMEDIATELY: *FEVER GREATER THAN 100.4 F (38 C) OR HIGHER *CHILLS OR SWEATING *NAUSEA AND VOMITING THAT IS NOT CONTROLLED WITH YOUR NAUSEA MEDICATION *UNUSUAL SHORTNESS OF BREATH *UNUSUAL BRUISING OR BLEEDING *URINARY PROBLEMS (pain or burning when urinating, or frequent urination) *BOWEL PROBLEMS (unusual diarrhea, constipation, pain near the anus) TENDERNESS IN MOUTH AND THROAT WITH OR WITHOUT PRESENCE OF ULCERS (sore throat, sores in mouth, or a toothache) UNUSUAL RASH, SWELLING OR PAIN  UNUSUAL VAGINAL DISCHARGE OR ITCHING   Items with * indicate a potential emergency and should be followed up as soon as possible or go to the Emergency Department if any problems should occur.  Please show the CHEMOTHERAPY ALERT CARD or IMMUNOTHERAPY  ALERT CARD at check-in to the Emergency Department and triage nurse.  Should you have questions after your visit or need to cancel or reschedule your appointment, please contact [facility]  Dept: [phone]  and follow the prompts.  Office hours are 8:00 a.m. to 4:30 p.m. Monday - Friday. Please note that voicemails left after 4:00 p.m. may not be returned until the following business day.  We are closed weekends and major holidays. You have access to a nurse at all times for urgent questions. Please call the main number to the clinic Dept: [phone] and follow the prompts.   For any non-urgent questions, you may also contact your provider using MyChart. We now offer e-Visits for anyone 10 and older to request care online for non-urgent symptoms. For details visit [email or web address].   Also download the MyChart app! Go to the app store, search MyChart, open the app, select [place], and log in with your MyChart username and password.

## 2023-12-31 ENCOUNTER — Inpatient Hospital Stay

## 2024-01-13 ENCOUNTER — Other Ambulatory Visit: Payer: Self-pay | Admitting: Oncology

## 2024-01-13 DIAGNOSIS — C240 Malignant neoplasm of extrahepatic bile duct: Secondary | ICD-10-CM

## 2024-01-18 ENCOUNTER — Telehealth: Payer: Self-pay | Admitting: Nurse Practitioner

## 2024-01-18 ENCOUNTER — Telehealth: Payer: Self-pay | Admitting: Oncology

## 2024-01-18 NOTE — Telephone Encounter (Signed)
 PT rescheduled his appt due to him having the flu, updated time and day confirmed. PT requested date due to him needing to work.

## 2024-01-19 ENCOUNTER — Other Ambulatory Visit: Payer: Self-pay

## 2024-01-20 ENCOUNTER — Inpatient Hospital Stay

## 2024-01-20 ENCOUNTER — Inpatient Hospital Stay: Admitting: Oncology

## 2024-01-23 ENCOUNTER — Other Ambulatory Visit: Payer: Self-pay

## 2024-02-02 ENCOUNTER — Ambulatory Visit (HOSPITAL_BASED_OUTPATIENT_CLINIC_OR_DEPARTMENT_OTHER): Admitting: Pulmonary Disease

## 2024-02-02 ENCOUNTER — Ambulatory Visit (HOSPITAL_BASED_OUTPATIENT_CLINIC_OR_DEPARTMENT_OTHER)

## 2024-02-02 ENCOUNTER — Encounter (HOSPITAL_BASED_OUTPATIENT_CLINIC_OR_DEPARTMENT_OTHER): Payer: Self-pay | Admitting: Pulmonary Disease

## 2024-02-02 ENCOUNTER — Other Ambulatory Visit (HOSPITAL_BASED_OUTPATIENT_CLINIC_OR_DEPARTMENT_OTHER): Payer: Self-pay

## 2024-02-02 VITALS — BP 137/87 | HR 82 | Ht 66.0 in | Wt 225.2 lb

## 2024-02-02 DIAGNOSIS — F17210 Nicotine dependence, cigarettes, uncomplicated: Secondary | ICD-10-CM

## 2024-02-02 DIAGNOSIS — R911 Solitary pulmonary nodule: Secondary | ICD-10-CM

## 2024-02-02 DIAGNOSIS — J449 Chronic obstructive pulmonary disease, unspecified: Secondary | ICD-10-CM

## 2024-02-02 DIAGNOSIS — J439 Emphysema, unspecified: Secondary | ICD-10-CM | POA: Diagnosis not present

## 2024-02-02 LAB — PULMONARY FUNCTION TEST
DL/VA % pred: 104 %
DL/VA: 4.34 ml/min/mmHg/L
DLCO cor % pred: 67 %
DLCO cor: 16.36 ml/min/mmHg
DLCO unc % pred: 59 %
DLCO unc: 14.42 ml/min/mmHg
FEF 25-75 Post: 0.46 L/s
FEF 25-75 Pre: 0.35 L/s
FEF2575-%Change-Post: 29 %
FEF2575-%Pred-Post: 19 %
FEF2575-%Pred-Pre: 14 %
FEV1-%Change-Post: 10 %
FEV1-%Pred-Post: 32 %
FEV1-%Pred-Pre: 29 %
FEV1-Post: 0.97 L
FEV1-Pre: 0.88 L
FEV1FVC-%Change-Post: -1 %
FEV1FVC-%Pred-Pre: 61 %
FEV6-%Change-Post: 8 %
FEV6-%Pred-Post: 52 %
FEV6-%Pred-Pre: 48 %
FEV6-Post: 2.03 L
FEV6-Pre: 1.86 L
FEV6FVC-%Change-Post: -2 %
FEV6FVC-%Pred-Post: 100 %
FEV6FVC-%Pred-Pre: 102 %
FVC-%Change-Post: 11 %
FVC-%Pred-Post: 52 %
FVC-%Pred-Pre: 47 %
FVC-Post: 2.14 L
FVC-Pre: 1.92 L
Post FEV1/FVC ratio: 45 %
Post FEV6/FVC ratio: 95 %
Pre FEV1/FVC ratio: 46 %
Pre FEV6/FVC Ratio: 97 %
RV % pred: 315 %
RV: 6.95 L
TLC % pred: 144 %
TLC: 9.28 L

## 2024-02-02 MED ORDER — SPIRIVA RESPIMAT 2.5 MCG/ACT IN AERS
2.0000 | INHALATION_SPRAY | Freq: Every day | RESPIRATORY_TRACT | 5 refills | Status: AC
  Filled 2024-02-02: qty 4, 30d supply, fill #0

## 2024-02-02 MED ORDER — FLUTICASONE-SALMETEROL 230-21 MCG/ACT IN AERO
2.0000 | INHALATION_SPRAY | Freq: Two times a day (BID) | RESPIRATORY_TRACT | 5 refills | Status: AC
  Filled 2024-02-02: qty 12, 30d supply, fill #0

## 2024-02-02 NOTE — Patient Instructions (Signed)
 Full PFT performed today.

## 2024-02-02 NOTE — Progress Notes (Signed)
 "   Subjective:   PATIENT ID: Robert [name] GENDER: male DOB: [birth year], MRN: [id number]  Chief Complaint  Patient presents with   COPD    Reason for Visit: PFT follow-up  Robert [name] is 69 year old male active smoker with cholangiocarcinoma, prostate cancer in 2018 in remission, COPD, HTN, HLD, atrial flutter on eliquis , secondary thrombocytopenia who presents for for follow-up PFT.  Initial consult Last year he lost 50 lbs on Wegovy  but had been taken off after his diagnosis of cholangiocarcinoma in April 2025. Currently undergoing chemotherapy with gemcitabine /cisplatin /durvalumab . He has had worsening shortness of breath in the last 3 weeks associated with wheezing at night. Has chronic smoker's productive cough that has worsened in the mornings. Has not had any antibiotics and steroids for this round. Last hospitalized for COPD exacerbation secondary to pneumonia in 02/2022. Had antibiotics and prednisone  shot 6 months for URI.  02/02/24 Since our last visit he reports he is compliant with Breztri  and Duoneb daily. Previously on Breztri  for 7 years. He does not feel his breztri  is not as effective as it was in the past. Has baseline shortness of breath, productive cough and wheezing   Social History: 1/2 ppd. Smoked 1ppd x 30 years. 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   Cancer (HCC)    COPD (chronic obstructive pulmonary disease) (HCC)    Elevated hemoglobin A1c    Elevated PSA 03/16/2016   Hypertension    Personal history of colonic adenomas 12/28/2007   Vitamin D  deficiency      Family History  Problem Relation Age of Onset   Lung cancer Mother    Heart disease Brother    Cancer Maternal Aunt        NOS   Lung disease Neg Hx    Colon cancer Neg Hx    Rectal cancer Neg Hx    Stomach cancer Neg Hx      Social History    Occupational History   Not on file  Tobacco Use   Smoking status: Every Day    Current packs/day: 0.50    Average packs/day: 0.5 packs/day for 40.0 years (20.0 ttl pk-yrs)    Types: Cigarettes    Passive exposure: Current (PAST too)   Smokeless tobacco: Never   Tobacco comments:    has decreased smoking significantly with goal of quitting  Vaping Use   Vaping status: Never Used  Substance and Sexual Activity   Alcohol use: Yes    Alcohol/week: 4.0 standard drinks of alcohol    Types: 4 Cans of beer per week    Comment: occasionally   Drug use: No   Sexual activity: Yes    Allergies  Allergen Reactions   Codeine Itching   Isovue  [Iopamidol ] Hives    Pt. Developed 1 hive after injection; Dr. Graham spoke with patient;recommends 13 hr prep in the future     Outpatient Medications Prior to Visit  Medication Sig Dispense Refill   albuterol  (VENTOLIN  HFA) 108 (90 Base) MCG/ACT inhaler Inhale 2 puffs into the lungs every 6 (six) hours as needed for wheezing or shortness of breath. 8 g 0   apixaban  (ELIQUIS ) 5 MG TABS tablet Take 1 tablet (5 mg total) by mouth 2 (two) times daily. 60 tablet 2   benazepril -hydrochlorthiazide (LOTENSIN  HCT) 20-25 MG tablet Take 0.5 tablets by mouth daily.  furosemide  (LASIX ) 20 MG tablet Take 1 tablet (20 mg total) by mouth daily. 90 tablet 3   ipratropium-albuterol  (DUONEB) 0.5-2.5 (3) MG/3ML SOLN Take 3 mLs by nebulization 2 (two) times daily as needed. 180 mL 5   BREZTRI  AEROSPHERE 160-9-4.8 MCG/ACT AERO inhaler Inhale 2 puffs into the lungs 2 (two) times daily.     azithromycin  (ZITHROMAX ) 250 MG tablet Take two tablets on day 1, then one tablet daily on day 2-5. (Patient not taking: Reported on 12/20/2023) 6 tablet 0   ondansetron  (ZOFRAN ) 8 MG tablet Take 1 tablet (8 mg total) by mouth every 8 (eight) hours as needed (May use 3 days after chemo as needed for nausea). (Patient not taking: Reported on 02/02/2024) 20 tablet 0   OXYGEN  Inhale 3 L  into the lungs as needed (as directed). (Patient not taking: Reported on 02/02/2024)     predniSONE  (DELTASONE ) 50 MG tablet Take 50 mg by mouth as directed. For CT prep (Patient not taking: Reported on 02/02/2024)     Facility-Administered Medications Prior to Visit  Medication Dose Route Frequency Provider Last Rate Last Admin   pegfilgrastim -jmdb (FULPHILA ) injection 6 mg  6 mg Subcutaneous Once Debby Olam POUR, NP        Review of Systems  Constitutional:  Negative for chills, diaphoresis, fever, malaise/fatigue and weight loss.  HENT:  Negative for congestion.   Respiratory:  Positive for cough, sputum production and shortness of breath. Negative for hemoptysis and wheezing.   Cardiovascular:  Negative for chest pain, palpitations and leg swelling.     Objective:   Vitals:   02/02/24 1346  BP: 137/87  Pulse: 82  SpO2: 96%  Weight: 225 lb 3.2 oz (102.2 kg)  Height: 5' 6 (1.676 m)   SpO2: 96 %  Physical Exam: General: Well-appearing, no acute distress HENT: [place], AT Eyes: EOMI, no scleral icterus Respiratory: Clear to auscultation bilaterally.  No crackles, wheezing or rales Cardiovascular: RRR, -M/R/G, no JVD Extremities:-Edema,-tenderness Neuro: AAO x4, CNII-XII grossly intact Psych: Normal mood, normal affect  Data Reviewed:  Imaging: CT CAP 05/06/23 - Mild emphysema in upper lobes, atelectasis in lingula and left lower lobe CT CAP 12/09/23 - Visualized lung fields with mild emphysema, new 6 mm nodular density in LLL, stable 3 mm LLL nodule  PFT: 08/09/16 FVC 2.71 (63%) FEV1 1.65 (50%) Ratio 61  TLC 102% DLCO 74% Interpretation: Moderately severe obstructive defect with air trapping and reduced DLCO c/w emphysema  02/02/24 FVC 2.14 (52%) FEV1 0.97 (32%) Ratio 46  TLC 144% RV 315% DLCO 59% Interpretation: Very severe obstructive defect with air trapping, hyperinflation and moderately reduced gas exchange   Labs: CBC    Component Value Date/Time   WBC 8.3  12/28/2023 1359   WBC 10.0 12/10/2023 0853   RBC 3.09 (L) 12/28/2023 1359   HGB 11.0 (L) 12/28/2023 1359   HCT 33.0 (L) 12/28/2023 1359   PLT 165 12/28/2023 1359   MCV 106.8 (H) 12/28/2023 1359   MCH 35.6 (H) 12/28/2023 1359   MCHC 33.3 12/28/2023 1359   RDW 16.1 (H) 12/28/2023 1359   LYMPHSABS 1.9 12/28/2023 1359   MONOABS 1.1 (H) 12/28/2023 1359   EOSABS 0.1 12/28/2023 1359   BASOSABS 0.0 12/28/2023 1359        Assessment & Plan:   Discussion: 69 year old male active smoker with cholangiocarcinoma on chemotherapy, prostate cancer in 2018 in remission, COPD, HTN, HLD, atrial flutter on eliquis , secondary thrombocytopenia who presents for worsening shortness of breath.  Reviewed PFTs with worsening obstruction and DLCO. Will stop Breztri  and increase ICS strength of triple therapy regimen   Emphysema --STOP Breztri  --START Spiriva  2.5 mcg TWO puffs ONCE a day --START Advair  HFA TWO puffs in the morning and evening. Rinse mouth out after use --CONTINUE Albuterol  TWO puffs AS NEEDED for shortness of breath or wheezing --INCREASE DUONEBS (nebulizer) TWICE a day  LLL nodule --Recommend CT chest in 6 months. Will coordinate with patient's Oncology team  Health Maintenance Immunization History  Administered Date(s) Administered   INFLUENZA, HIGH DOSE SEASONAL PF 10/14/2022, 11/23/2023   Influenza,inj,Quad PF,6+ Mos 12/27/2017, 12/15/2018, 01/07/2022   Janssen (J&J) SARS-COV-2 Vaccination 03/27/2019   PPD Test 03/21/2020, 03/31/2021   Pneumococcal Polysaccharide-23 01/11/1997, 12/27/2017   Td 01/12/2004   Tdap 07/09/2016   Zoster, Live 01/12/2008   CT Lung Screen - qualified but currently receiving cancer screenings  No orders of the defined types were placed in this encounter.  Meds ordered this encounter  Medications   Tiotropium Bromide  (SPIRIVA  RESPIMAT) 2.5 MCG/ACT AERS    Sig: Inhale 2 puffs into the lungs daily.    Dispense:  4 g    Refill:  5    fluticasone -salmeterol (ADVAIR  HFA) 230-21 MCG/ACT inhaler    Sig: Inhale 2 puffs into the lungs 2 (two) times daily.    Dispense:  1 each    Refill:  5    Return in about 2 months (around 04/01/2024).  I have spent a total time of 32-minutes on the day of the appointment including chart review, data review, collecting history, coordinating care and discussing medical diagnosis and plan with the patient/family. Past medical history, allergies, medications were reviewed. Pertinent imaging, labs and tests included in this note have been reviewed and interpreted independently by me.  Shamiracle Gorden Slater Staff, MD [facility] 02/02/2024 2:58 PM     "

## 2024-02-02 NOTE — Patient Instructions (Signed)
 Emphysema --START Spiriva  2.5 mcg TWO puffs ONCE a day --START Advair  HFA TWO puffs in the morning and evening. Rinse mouth out after use --CONTINUE Albuterol  TWO puffs AS NEEDED for shortness of breath or wheezing --INCREASE DUONEBS (nebulizer) TWICE a day

## 2024-02-02 NOTE — Progress Notes (Signed)
 Full PFT performed today.

## 2024-02-03 ENCOUNTER — Other Ambulatory Visit: Payer: Self-pay

## 2024-02-10 ENCOUNTER — Inpatient Hospital Stay

## 2024-02-10 ENCOUNTER — Inpatient Hospital Stay: Attending: Oncology

## 2024-02-10 ENCOUNTER — Encounter: Payer: Self-pay | Admitting: Nurse Practitioner

## 2024-02-10 ENCOUNTER — Inpatient Hospital Stay: Admitting: Nurse Practitioner

## 2024-02-10 VITALS — BP 167/79 | HR 57 | Temp 98.4°F | Resp 18

## 2024-02-10 VITALS — BP 132/58 | HR 70 | Temp 97.8°F | Resp 18 | Ht 66.0 in | Wt 225.4 lb

## 2024-02-10 DIAGNOSIS — I10 Essential (primary) hypertension: Secondary | ICD-10-CM | POA: Insufficient documentation

## 2024-02-10 DIAGNOSIS — C61 Malignant neoplasm of prostate: Secondary | ICD-10-CM | POA: Insufficient documentation

## 2024-02-10 DIAGNOSIS — C221 Intrahepatic bile duct carcinoma: Secondary | ICD-10-CM | POA: Insufficient documentation

## 2024-02-10 DIAGNOSIS — E785 Hyperlipidemia, unspecified: Secondary | ICD-10-CM | POA: Insufficient documentation

## 2024-02-10 DIAGNOSIS — Z5111 Encounter for antineoplastic chemotherapy: Secondary | ICD-10-CM | POA: Diagnosis present

## 2024-02-10 DIAGNOSIS — Z79899 Other long term (current) drug therapy: Secondary | ICD-10-CM | POA: Insufficient documentation

## 2024-02-10 DIAGNOSIS — C240 Malignant neoplasm of extrahepatic bile duct: Secondary | ICD-10-CM

## 2024-02-10 DIAGNOSIS — J449 Chronic obstructive pulmonary disease, unspecified: Secondary | ICD-10-CM | POA: Diagnosis not present

## 2024-02-10 DIAGNOSIS — D6959 Other secondary thrombocytopenia: Secondary | ICD-10-CM | POA: Diagnosis not present

## 2024-02-10 DIAGNOSIS — T451X5A Adverse effect of antineoplastic and immunosuppressive drugs, initial encounter: Secondary | ICD-10-CM | POA: Insufficient documentation

## 2024-02-10 LAB — CMP (CANCER CENTER ONLY)
ALT: 17 U/L (ref 0–44)
AST: 21 U/L (ref 15–41)
Albumin: 3.9 g/dL (ref 3.5–5.0)
Alkaline Phosphatase: 70 U/L (ref 38–126)
Anion gap: 9 (ref 5–15)
BUN: 16 mg/dL (ref 8–23)
CO2: 31 mmol/L (ref 22–32)
Calcium: 10 mg/dL (ref 8.9–10.3)
Chloride: 98 mmol/L (ref 98–111)
Creatinine: 0.65 mg/dL (ref 0.61–1.24)
GFR, Estimated: >60 mL/min
Glucose, Bld: 113 mg/dL — ABNORMAL HIGH (ref 70–99)
Potassium: 4.1 mmol/L (ref 3.5–5.1)
Sodium: 137 mmol/L (ref 135–145)
Total Bilirubin: 0.4 mg/dL (ref 0.0–1.2)
Total Protein: 6.8 g/dL (ref 6.5–8.1)

## 2024-02-10 LAB — CBC WITH DIFFERENTIAL (CANCER CENTER ONLY)
Abs Immature Granulocytes: 0 10*3/uL (ref 0.00–0.07)
Basophils Absolute: 0 10*3/uL (ref 0.0–0.1)
Basophils Relative: 0 %
Eosinophils Absolute: 0.1 10*3/uL (ref 0.0–0.5)
Eosinophils Relative: 3 %
HCT: 35.6 % — ABNORMAL LOW (ref 39.0–52.0)
Hemoglobin: 11.8 g/dL — ABNORMAL LOW (ref 13.0–17.0)
Immature Granulocytes: 0 %
Lymphocytes Relative: 24 %
Lymphs Abs: 1.2 10*3/uL (ref 0.7–4.0)
MCH: 35 pg — ABNORMAL HIGH (ref 26.0–34.0)
MCHC: 33.1 g/dL (ref 30.0–36.0)
MCV: 105.6 fL — ABNORMAL HIGH (ref 80.0–100.0)
Monocytes Absolute: 0.8 10*3/uL (ref 0.1–1.0)
Monocytes Relative: 17 %
Neutro Abs: 2.9 10*3/uL (ref 1.7–7.7)
Neutrophils Relative %: 56 %
Platelet Count: 140 10*3/uL — ABNORMAL LOW (ref 150–400)
RBC: 3.37 MIL/uL — ABNORMAL LOW (ref 4.22–5.81)
RDW: 14.4 % (ref 11.5–15.5)
WBC Count: 5 10*3/uL (ref 4.0–10.5)
nRBC: 0 % (ref 0.0–0.2)

## 2024-02-10 LAB — TSH: TSH: 1.05 u[IU]/mL (ref 0.350–4.500)

## 2024-02-10 MED ORDER — SODIUM CHLORIDE 0.9 % IV SOLN: INTRAVENOUS | Status: DC

## 2024-02-10 MED ORDER — SODIUM CHLORIDE 0.9% FLUSH: 10.0000 mL | INTRAVENOUS | Status: DC | PRN

## 2024-02-10 MED ORDER — SODIUM CHLORIDE 0.9 % IV SOLN
1500.0000 mg | Freq: Once | INTRAVENOUS | Status: AC
  Administered 2024-02-10: 1500 mg via INTRAVENOUS
  Filled 2024-02-10: qty 30

## 2024-02-10 NOTE — Progress Notes (Signed)
 Patient seen by [name] NP today  Vitals are within treatment parameters:Yes   Labs are within treatment parameters: Yes   Treatment plan has been signed: Yes   Per physician team, Patient is ready for treatment and there are NO modifications to the treatment plan.

## 2024-02-10 NOTE — Patient Instructions (Signed)
 Device With a Small Disc Placed for Long-Term IV Use (Implanted Port): Care at Home An implanted port is a device with a small disc that's put under your skin. In most cases, it's placed in your chest. It can be used to draw blood and send medicines and fluids quickly into your bloodstream. You may need a port to give you: IV medicines that would bother the small veins in your hands or arms. IV medicines for a long time, such as medicines to kill cancer cells (chemotherapy). IV liquid nutrition for a long time. An implanted port has 2 main parts: The portal. This is the round disc where the needle is put in. It may look like a small, raised area under your skin. The catheter. This is a soft tube that connects the port to a vein. You may be able to do more everyday tasks with a port than with other types of long-term IVs. How is my port accessed?  A numbing cream may be put on the skin over the port site. Your skin will be cleaned with a solution that kills germs. Your health care provider will gently pinch the port and put a needle in it. The port will be checked to make sure it's in the vein and working. If you need to get medicine all the time, the needle may be left in the port. Your provider will put a clear bandage over the needle site. The bandage and needle will need to be changed each week. What is flushing? Flushing helps keep the port working like it should. Flush your port as told. If your port is only used from time to time to give medicines or draw blood, you may need to flush it: Before and after medicines have been given. Before and after blood has been drawn. Every 4-6 weeks to keep it working well. If you get medicine through your port all the time, you may not need to flush it. Talk with your provider to learn more. How long will my port stay in? The port will stay in for as long as it's needed. When it's time for it to come out, you'll have a procedure to remove it. Follow  these instructions at home: Caring for your port and port site Flush your port as told. If you need to get an infusion of medicine over a few days, take care of your port as told. Make sure you: Take care of your port site. Wash your hands with soap and water  for at least 20 seconds before and after you change your bandage. If you can't use soap and water , use hand sanitizer. Put any used bandages or infusion bags in a plastic bag. Throw the bag in the trash. Keep the bandage that covers the needle clean and dry. Do not let it get wet. Do not use scissors or sharp objects near the tubes. Keep any tubes clamped, unless they're being used. Check the area around your port site every day for signs of infection. Check for: Redness, swelling, or pain. Fluid or blood. Warmth. Pus or a bad smell. Protect the skin around the port site. Avoid wearing bra straps that rub or irritate the site. Protect the skin around your port from seat belts. Place a soft pad over your chest if needed. Do not take baths, swim, or use a hot tub until you're told it's OK. Ask if you can shower. General instructions  Throw away any syringes in a container that's meant for sharp  items (sharps container). You can buy a sharps container from a pharmacy. You can also make one by using an empty, hard plastic bottle with a lid. Always carry a medical alert card or wear a medical alert bracelet. Ask what things are safe for you to do at home. Ask when you can go back to work or school. Where to find more information To learn more, go to: American Cancer Society at prombar.it. Click on the magnifying glass and type IV lines and ports. Find the link you need. American Society of Clinical Oncology at fabvets.de. Click on the magnifying glass and type getting chemo infusions. Find the link you need. Contact a health care provider if: You can't flush your port. You can't draw blood from the port. You have a fever or  chills. You have any signs of infection. You have swelling in your arm, neck, or shoulder. This information is not intended to replace advice given to you by your health care provider. Make sure you discuss any questions you have with your health care provider. Document Revised: 10/11/2023 Document Reviewed: 10/11/2023 Elsevier Patient Education  2025 [employer].

## 2024-02-10 NOTE — Progress Notes (Signed)
 " [facility] OFFICE PROGRESS NOTE   Diagnosis: Cholangiocarcinoma  INTERVAL HISTORY:   Robert [name] returns for follow-up.  He completed cycle 8 cisplatin /Durvalumab  12/20/2023 and 12/29/2023.  Gemcitabine  was held due to thrombocytopenia.  He was scheduled to begin maintenance Durvalumab  01/20/2024.  He was unable to come in for that visit due to influenza.  He has recovered from the flu.  He began Spiriva  and Advair  about a week ago and notes improvement in dyspnea on exertion.  He has an occasional cough.  No nausea or vomiting.  No mouth sores.  No diarrhea.  No rash.  In general he is feeling well.  Objective:  Vital signs in last 24 hours:  Blood pressure (!) 132/58, pulse 70, temperature 97.8 F (36.6 C), temperature source Temporal, resp. rate 18, height 5' 6 (1.676 m), weight 225 lb 6.4 oz (102.2 kg), SpO2 93%.    HEENT: No thrush or ulcers. Resp: A few faint wheezes.  No respiratory distress. Cardio: Regular rate and rhythm. GI: No hepatosplenomegaly.  Nontender. Vascular: No leg edema. Neuro: Alert and oriented. Skin: No rash. Port-A-Cath without erythema.  Lab Results:  Lab Results  Component Value Date   WBC 5.0 02/10/2024   HGB 11.8 (L) 02/10/2024   HCT 35.6 (L) 02/10/2024   MCV 105.6 (H) 02/10/2024   PLT 140 (L) 02/10/2024   NEUTROABS 2.9 02/10/2024    Imaging:  No results found.  Medications: I have reviewed the patient's current medications.  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 with dilation of the left and right biliary systems, left and  right hepatic stents and temporary pancreatic stent placed, stricture brushing-adenocarcinoma; HER2 IHC result equivocal (2+), FISH results indeterminant; Tempus-microsatellite stable, tumor mutation burden less than 10, ERBB2 expression, BRCA2 mutated CT chest 05/06/2023: No evidence of metastatic disease Elevated CA 19-9 on 05/03/2023 CT abdomen 05/27/2023 (due), decreased Intermatic biliary duct dilation, status post stenting of common bile duct, persistent soft tissue at the hepatic hilum, pancreatic duct stent in place Upper endoscopy 06/16/2023-gastritis.  Duodenitis.  Plastic biliary and pancreatic stents noted at the major papilla.  Pancreatic stent removed. Cycle 1 gemcitabine /cisplatin /Durvalumab  06/20/2023, day 8 06/28/2023 (Gemcitabine  dose reduced due to thrombocytopenia), Fulphila  Cycle 2 gemcitabine /cisplatin /Durvalumab  07/14/2023, day 8 07/21/2023, Fulphila  Cycle 3 gemcitabine /cisplatin /durvalumab  08/09/2023 (Gem dose reduced C3D8 for thrombocytopenia) Cycle 4 Day 1 gem/cisplatin /durvalumab  08/31/23, gem dose reduced; gemcitabine /cisplatin  09/08/2023 09/23/2023 CT abdomen: No change in the ill-defined soft tissue surrounding the common bile duct, stable periportal and retroperitoneal lymph nodes, biliary stents in place with decreased intrahepatic duct dilation Cycle 5-day 1 gemcitabine /cisplatin /durvalumab  10/05/2023, day 8 10/12/2023 Cycle 6-day 1 gemcitabine /cisplatin /durvalumab  11/02/2023 (gemcitabine  further dose reduced); day 8 11/09/2023 Gemcitabine  held due to thrombocytopenia Cycle 7-day 1 gemcitabine /cisplatin /durvalumab  11/22/2023, day 8 11/30/2023 (Gemcitabine  dose reduced due to thrombocytopenia) CTs 12/09/2023-new 6 mm nodular density left lower lobe.  No evidence for metastatic disease in the abdomen or pelvis.  Stable biliary stent. 12/16/2023 Dr. Romero -unresectable.  Recommends maintenance Durvalumab . Cycle 8-day 1 cisplatin /Durvalumab  12/20/2023, Gemcitabine  held due to  thrombocytopenia; day 8 cisplatin  12/29/2023 Maintenance Durvalumab  02/10/2024 Biliary obstruction secondary #1, status post placement of right and left hepatic duct stents 05/06/2023 ERCP 11/07/2023-prior biliary sphincterotomy appeared open.  2 partially occluded stents from the biliary tree were seen in the major papilla, removed.  Middle third of the main bile duct and lower third of the main bile duct were mildly dilated.  Irregularity/narrowing found in the common hepatic duct (improved from prior).  1 plastic biliary stent was placed into the left hepatic duct. 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  He is scheduled to begin maintenance Durvalumab  today on a 28-day schedule.  There is no clinical evidence of disease progression.  Plan to proceed as planned.  CBC and chemistry panel reviewed.  Labs are adequate for treatment.  He will return for follow-up and Durvalumab  in 4 weeks.  We are available to see him sooner if needed.   [name] ANP/GNP-BC   02/10/2024  12:12 PM        "

## 2024-02-10 NOTE — Patient Instructions (Signed)
 [facility] - A DEPT OF [facility] [place]  Discharge Instructions: Thank you for choosing [facility] to provide your oncology and hematology care.   If you have a lab appointment with the Cancer Center, please go directly to the Cancer Center and check in at the registration area.   Wear comfortable clothing and clothing appropriate for easy access to any Portacath or PICC line.   We strive to give you quality time with your provider. You may need to reschedule your appointment if you arrive late (15 or more minutes).  Arriving late affects you and other patients whose appointments are after yours.  Also, if you miss three or more appointments without notifying the office, you may be dismissed from the clinic at the provider's discretion.      For prescription refill requests, have your pharmacy contact our office and allow 72 hours for refills to be completed.    Today you received the following chemotherapy and/or immunotherapy agents: imfinzi       To help prevent nausea and vomiting after your treatment, we encourage you to take your nausea medication as directed.  BELOW ARE SYMPTOMS THAT SHOULD BE REPORTED IMMEDIATELY: *FEVER GREATER THAN 100.4 F (38 C) OR HIGHER *CHILLS OR SWEATING *NAUSEA AND VOMITING THAT IS NOT CONTROLLED WITH YOUR NAUSEA MEDICATION *UNUSUAL SHORTNESS OF BREATH *UNUSUAL BRUISING OR BLEEDING *URINARY PROBLEMS (pain or burning when urinating, or frequent urination) *BOWEL PROBLEMS (unusual diarrhea, constipation, pain near the anus) TENDERNESS IN MOUTH AND THROAT WITH OR WITHOUT PRESENCE OF ULCERS (sore throat, sores in mouth, or a toothache) UNUSUAL RASH, SWELLING OR PAIN  UNUSUAL VAGINAL DISCHARGE OR ITCHING   Items with * indicate a potential emergency and should be followed up as soon as possible or go to the Emergency Department if any problems should occur.  Please show the CHEMOTHERAPY ALERT CARD or IMMUNOTHERAPY  ALERT CARD at check-in to the Emergency Department and triage nurse.  Should you have questions after your visit or need to cancel or reschedule your appointment, please contact [facility]  Dept: [phone]  and follow the prompts.  Office hours are 8:00 a.m. to 4:30 p.m. Monday - Friday. Please note that voicemails left after 4:00 p.m. may not be returned until the following business day.  We are closed weekends and major holidays. You have access to a nurse at all times for urgent questions. Please call the main number to the clinic Dept: [phone] and follow the prompts.   For any non-urgent questions, you may also contact your provider using MyChart. We now offer e-Visits for anyone 10 and older to request care online for non-urgent symptoms. For details visit [email or web address].   Also download the MyChart app! Go to the app store, search MyChart, open the app, select [place], and log in with your MyChart username and password.

## 2024-02-11 LAB — CANCER ANTIGEN 19-9: CA 19-9: 32 U/mL (ref 0–35)

## 2024-02-11 LAB — T4: T4, Total: 7.8 ug/dL (ref 4.5–12.0)

## 2024-02-16 ENCOUNTER — Other Ambulatory Visit: Payer: Self-pay

## 2024-02-17 ENCOUNTER — Inpatient Hospital Stay

## 2024-02-17 ENCOUNTER — Inpatient Hospital Stay: Admitting: Nurse Practitioner

## 2024-03-08 ENCOUNTER — Inpatient Hospital Stay

## 2024-03-08 ENCOUNTER — Inpatient Hospital Stay: Admitting: Nurse Practitioner

## 2024-04-05 ENCOUNTER — Inpatient Hospital Stay: Admitting: Oncology

## 2024-04-05 ENCOUNTER — Ambulatory Visit (HOSPITAL_BASED_OUTPATIENT_CLINIC_OR_DEPARTMENT_OTHER): Admitting: Pulmonary Disease

## 2024-04-05 ENCOUNTER — Inpatient Hospital Stay
